# Patient Record
Sex: Female | Born: 1960
Health system: Southern US, Community
[De-identification: ages and names within clinical notes are randomized; demographics above are authoritative.]

## PROBLEM LIST (undated history)

## (undated) DIAGNOSIS — M1990 Unspecified osteoarthritis, unspecified site: Secondary | ICD-10-CM

## (undated) DIAGNOSIS — J45909 Unspecified asthma, uncomplicated: Secondary | ICD-10-CM

## (undated) DIAGNOSIS — R569 Unspecified convulsions: Secondary | ICD-10-CM

## (undated) DIAGNOSIS — K852 Alcohol induced acute pancreatitis without necrosis or infection: Secondary | ICD-10-CM

## (undated) DIAGNOSIS — F419 Anxiety disorder, unspecified: Secondary | ICD-10-CM

## (undated) DIAGNOSIS — E785 Hyperlipidemia, unspecified: Secondary | ICD-10-CM

## (undated) DIAGNOSIS — F988 Other specified behavioral and emotional disorders with onset usually occurring in childhood and adolescence: Secondary | ICD-10-CM

## (undated) DIAGNOSIS — M519 Unspecified thoracic, thoracolumbar and lumbosacral intervertebral disc disorder: Secondary | ICD-10-CM

## (undated) DIAGNOSIS — K859 Acute pancreatitis without necrosis or infection, unspecified: Secondary | ICD-10-CM

## (undated) DIAGNOSIS — F10939 Alcohol use, unspecified with withdrawal, unspecified: Secondary | ICD-10-CM

## (undated) DIAGNOSIS — I10 Essential (primary) hypertension: Secondary | ICD-10-CM

## (undated) DIAGNOSIS — J189 Pneumonia, unspecified organism: Secondary | ICD-10-CM

## (undated) DIAGNOSIS — R945 Abnormal results of liver function studies: Secondary | ICD-10-CM

## (undated) HISTORY — PX: CHOLECYSTECTOMY: SHX55

## (undated) HISTORY — PX: HERNIA REPAIR: SHX51

## (undated) HISTORY — PX: FOOT SURGERY: SHX648

## (undated) HISTORY — PX: KNEE ARTHROSCOPY: SUR90

---

## 1998-03-01 ENCOUNTER — Other Ambulatory Visit: Admission: RE | Admit: 1998-03-01 | Discharge: 1998-03-01 | Payer: Self-pay | Admitting: Obstetrics and Gynecology

## 1998-08-21 ENCOUNTER — Ambulatory Visit (HOSPITAL_COMMUNITY): Admission: RE | Admit: 1998-08-21 | Discharge: 1998-08-21 | Payer: Self-pay | Admitting: Obstetrics and Gynecology

## 1999-01-21 ENCOUNTER — Other Ambulatory Visit: Admission: RE | Admit: 1999-01-21 | Discharge: 1999-01-21 | Payer: Self-pay | Admitting: *Deleted

## 1999-03-27 ENCOUNTER — Other Ambulatory Visit: Admission: RE | Admit: 1999-03-27 | Discharge: 1999-03-27 | Payer: Self-pay | Admitting: Obstetrics and Gynecology

## 1999-09-17 ENCOUNTER — Encounter: Payer: Self-pay | Admitting: Family Medicine

## 1999-09-17 ENCOUNTER — Ambulatory Visit (HOSPITAL_COMMUNITY): Admission: RE | Admit: 1999-09-17 | Discharge: 1999-09-17 | Payer: Self-pay | Admitting: Family Medicine

## 2000-04-06 ENCOUNTER — Other Ambulatory Visit: Admission: RE | Admit: 2000-04-06 | Discharge: 2000-04-06 | Payer: Self-pay | Admitting: Obstetrics and Gynecology

## 2000-10-06 ENCOUNTER — Encounter: Payer: Self-pay | Admitting: Obstetrics and Gynecology

## 2000-10-06 ENCOUNTER — Ambulatory Visit (HOSPITAL_COMMUNITY): Admission: RE | Admit: 2000-10-06 | Discharge: 2000-10-06 | Payer: Self-pay | Admitting: Family Medicine

## 2000-10-27 ENCOUNTER — Encounter: Payer: Self-pay | Admitting: Vascular Surgery

## 2000-10-29 ENCOUNTER — Encounter (INDEPENDENT_AMBULATORY_CARE_PROVIDER_SITE_OTHER): Payer: Self-pay

## 2000-10-29 ENCOUNTER — Ambulatory Visit (HOSPITAL_COMMUNITY): Admission: RE | Admit: 2000-10-29 | Discharge: 2000-10-30 | Payer: Self-pay | Admitting: Vascular Surgery

## 2001-05-17 ENCOUNTER — Other Ambulatory Visit: Admission: RE | Admit: 2001-05-17 | Discharge: 2001-05-17 | Payer: Self-pay | Admitting: *Deleted

## 2001-06-03 ENCOUNTER — Ambulatory Visit (HOSPITAL_COMMUNITY): Admission: RE | Admit: 2001-06-03 | Discharge: 2001-06-03 | Payer: Self-pay | Admitting: Vascular Surgery

## 2001-06-03 ENCOUNTER — Encounter (INDEPENDENT_AMBULATORY_CARE_PROVIDER_SITE_OTHER): Payer: Self-pay | Admitting: *Deleted

## 2001-10-19 ENCOUNTER — Ambulatory Visit (HOSPITAL_COMMUNITY): Admission: RE | Admit: 2001-10-19 | Discharge: 2001-10-19 | Payer: Self-pay | Admitting: Obstetrics and Gynecology

## 2001-10-19 ENCOUNTER — Encounter: Payer: Self-pay | Admitting: Obstetrics and Gynecology

## 2002-04-13 ENCOUNTER — Other Ambulatory Visit: Admission: RE | Admit: 2002-04-13 | Discharge: 2002-04-13 | Payer: Self-pay | Admitting: Obstetrics and Gynecology

## 2003-03-20 ENCOUNTER — Encounter: Payer: Self-pay | Admitting: Obstetrics and Gynecology

## 2003-03-20 ENCOUNTER — Ambulatory Visit (HOSPITAL_COMMUNITY): Admission: RE | Admit: 2003-03-20 | Discharge: 2003-03-20 | Payer: Self-pay | Admitting: Obstetrics and Gynecology

## 2003-06-12 ENCOUNTER — Other Ambulatory Visit: Admission: RE | Admit: 2003-06-12 | Discharge: 2003-06-12 | Payer: Self-pay | Admitting: Obstetrics and Gynecology

## 2004-06-18 ENCOUNTER — Other Ambulatory Visit: Admission: RE | Admit: 2004-06-18 | Discharge: 2004-06-18 | Payer: Self-pay | Admitting: Obstetrics and Gynecology

## 2004-06-27 ENCOUNTER — Ambulatory Visit (HOSPITAL_COMMUNITY): Admission: RE | Admit: 2004-06-27 | Discharge: 2004-06-27 | Payer: Self-pay | Admitting: Orthopedic Surgery

## 2004-06-27 ENCOUNTER — Ambulatory Visit (HOSPITAL_BASED_OUTPATIENT_CLINIC_OR_DEPARTMENT_OTHER): Admission: RE | Admit: 2004-06-27 | Discharge: 2004-06-27 | Payer: Self-pay | Admitting: Orthopedic Surgery

## 2004-10-24 ENCOUNTER — Ambulatory Visit (HOSPITAL_COMMUNITY): Admission: RE | Admit: 2004-10-24 | Discharge: 2004-10-24 | Payer: Self-pay | Admitting: Obstetrics and Gynecology

## 2005-01-29 ENCOUNTER — Encounter (INDEPENDENT_AMBULATORY_CARE_PROVIDER_SITE_OTHER): Payer: Self-pay | Admitting: Specialist

## 2005-01-29 ENCOUNTER — Ambulatory Visit (HOSPITAL_COMMUNITY): Admission: RE | Admit: 2005-01-29 | Discharge: 2005-01-29 | Payer: Self-pay | Admitting: *Deleted

## 2005-10-02 ENCOUNTER — Other Ambulatory Visit: Admission: RE | Admit: 2005-10-02 | Discharge: 2005-10-02 | Payer: Self-pay | Admitting: Obstetrics and Gynecology

## 2005-12-23 ENCOUNTER — Ambulatory Visit (HOSPITAL_COMMUNITY): Admission: RE | Admit: 2005-12-23 | Discharge: 2005-12-23 | Payer: Self-pay | Admitting: Obstetrics and Gynecology

## 2007-01-12 ENCOUNTER — Ambulatory Visit (HOSPITAL_COMMUNITY): Admission: RE | Admit: 2007-01-12 | Discharge: 2007-01-12 | Payer: Self-pay | Admitting: Obstetrics and Gynecology

## 2007-05-14 ENCOUNTER — Encounter: Admission: RE | Admit: 2007-05-14 | Discharge: 2007-05-14 | Payer: Self-pay | Admitting: Family Medicine

## 2007-05-14 IMAGING — US US ABDOMEN COMPLETE
1 series · 13 of 25 positions shown · non-contrast
Comparison: none

CLINICAL DATA: Left lower quadrant pain.  Laparoscopy for endometriosis.  Diabetes, hypertension. 
COMPLETE ABDOMINAL ULTRASOUND:
TECHNIQUE: Complete abdominal ultrasound examination was performed including evaluation of the liver, gallbladder, bile ducts, pancreas, kidneys, spleen, IVC, and abdominal aorta.

[Series 1: us abdomen complete · 0.33mm/px · 13 of 116 slices shown]
[im 1/116]
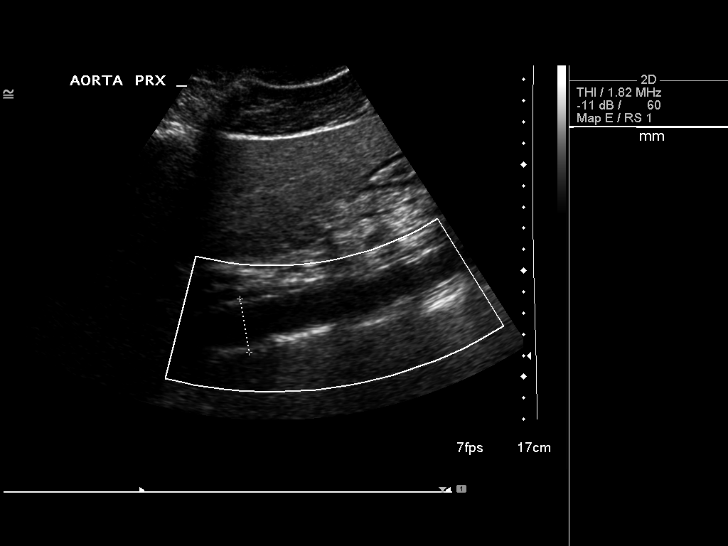
[im 10/116]
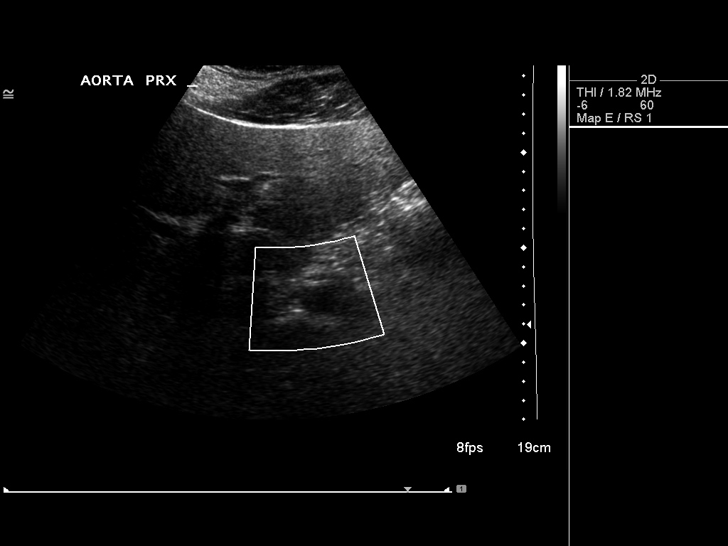
[im 20/116]
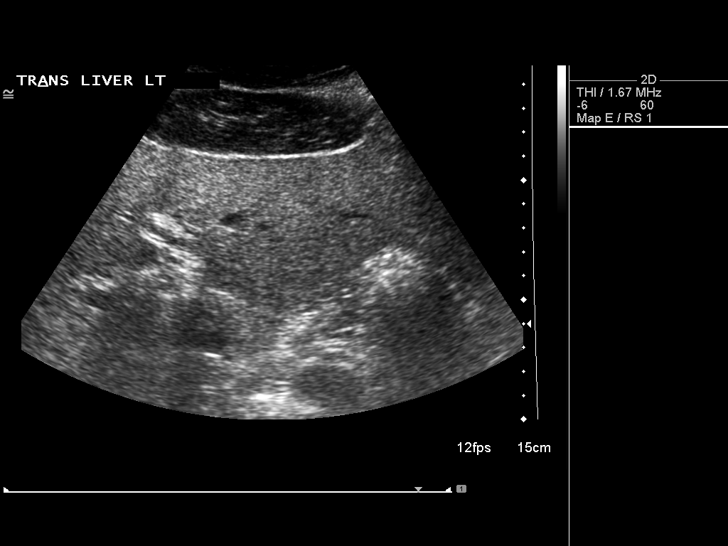
[im 29/116]
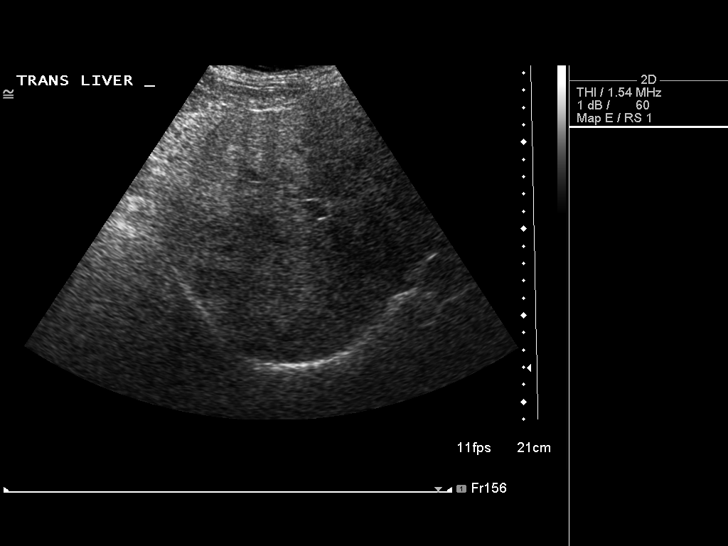
[im 39/116]
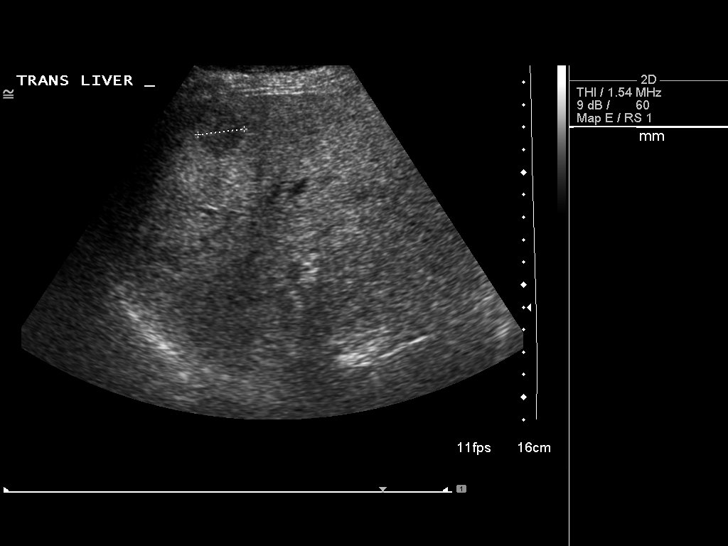
[im 48/116]
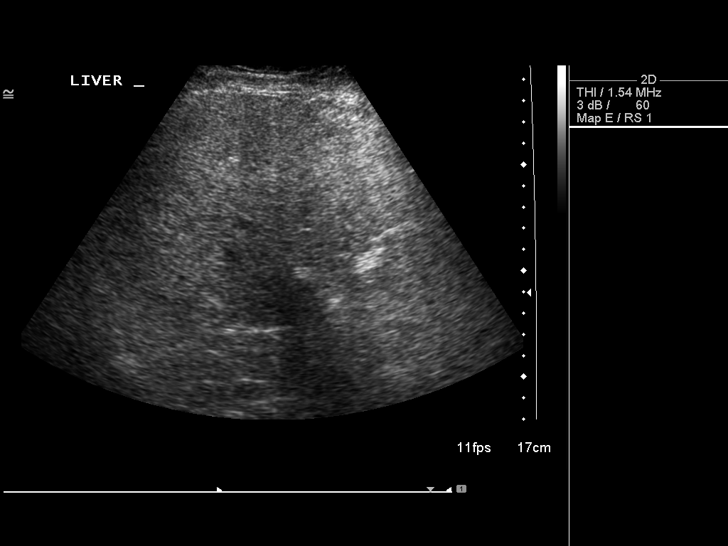
[im 58/116]
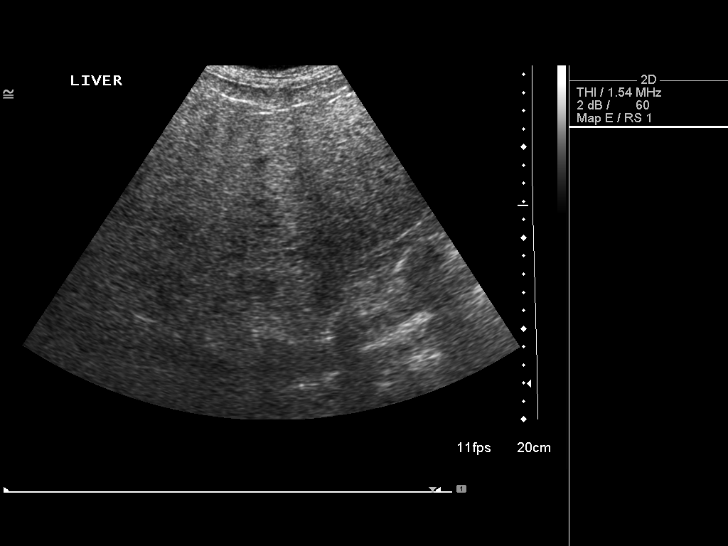
[im 68/116]
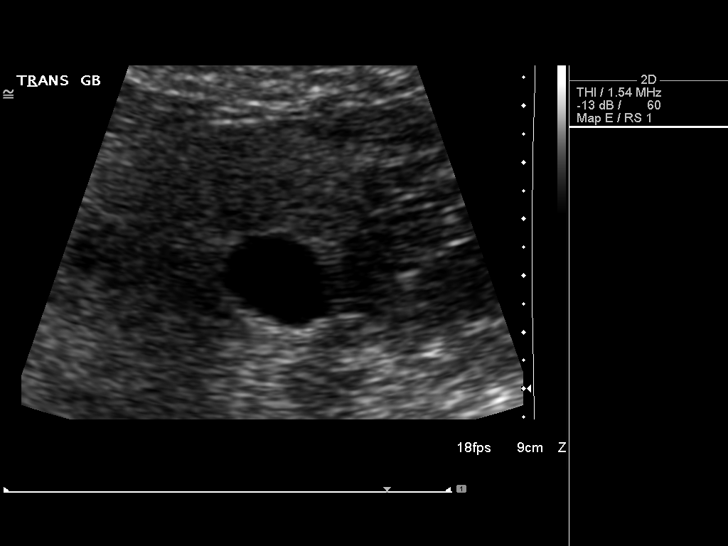
[im 77/116]
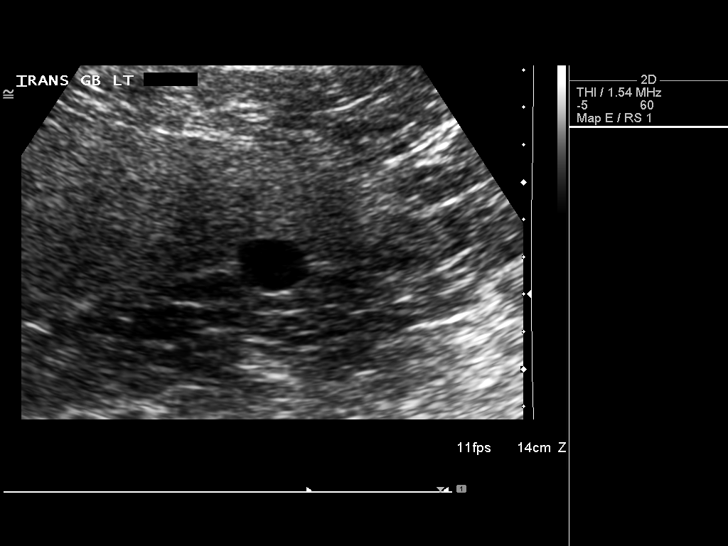
[im 87/116]
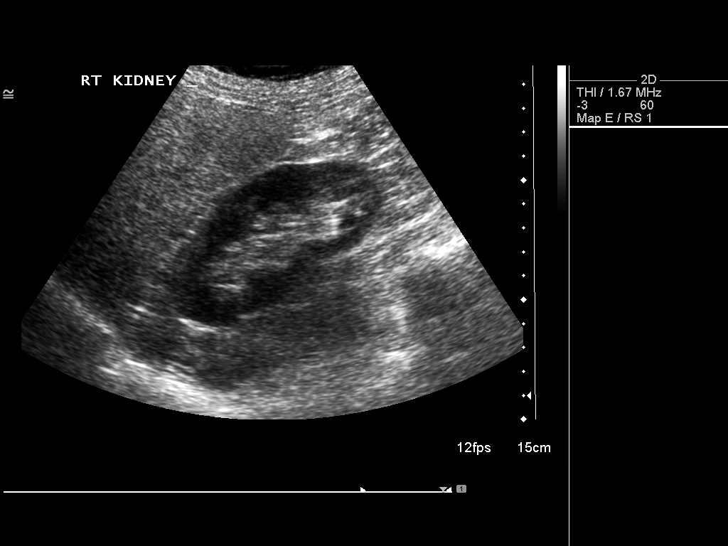
[im 96/116]
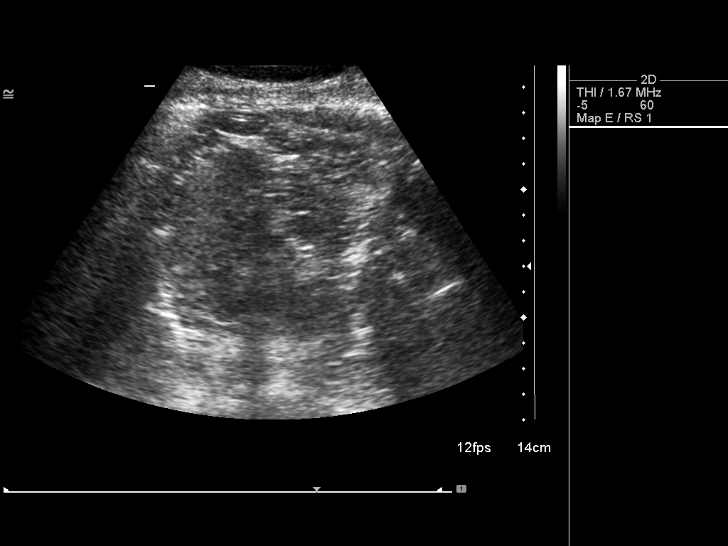
[im 106/116]
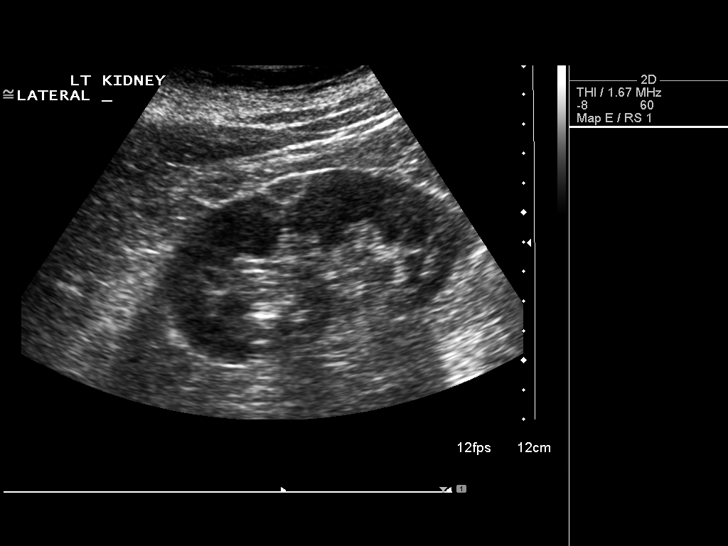
[im 116/116]
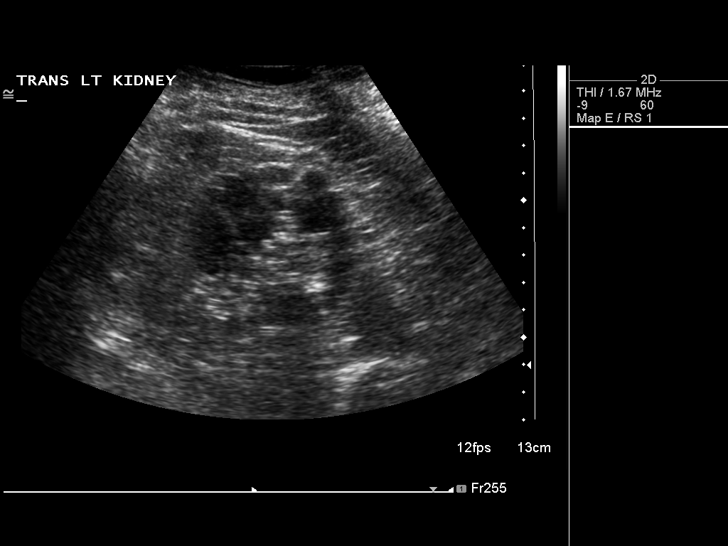

[13 of 25 positions shown; findings below may reference images not displayed]

FINDINGS: Normal gallbladder.  Gallbladder wall thickness is 1.4mm.  The common duct measures 3.6mm which is within normal limits.  The liver has diffuse increase in echogenicity compatible with fatty infiltration.  With in the left hepatic lobe there is a 1.8 x 2.0 x 2.1cm somewhat oval focus which is hypoechoic to isoechoic.  Some internal color flow was noted.  The borders are somewhat irregular.  Cannot exclude a liver mass.  Pancreas is unremarkable.  Spleen measures 7.8cm in length.  The right and left kidneys measure 9.7cm and 10.2cm in length, respectively.  There is echogenic rim nodular like focus along the lateral aspect of the mid left kidney measuring 0.8 x 1.3 x 1.4cm.  I cannot exclude a small lesion at that site.  
Abdominal aorta maximum diameter is 2.6cm.  Patent IVC.
IMPRESSION: Diffuse fatty infiltration of the liver.  Liver lesion is noted and is suspicious for a mass including primary or secondary tumors.  Suspicion for small mass or lesion involving the mid left kidney.  For further work up I would recommend contrast enhanced CT or MRI with special attention to the liver and left kidney.

## 2007-05-14 IMAGING — US US TRANSVAGINAL NON-OB
1 series · 14 of 25 positions shown · non-contrast
Comparison: none

CLINICAL DATA: Left lower quadrant pain. 
 TRANSABDOMINAL AND TRANSVAGINAL PELVIC ULTRASOUND:
TECHNIQUE: Both transabdominal and transvaginal ultrasound examinations of the pelvis were performed including evaluation of the uterus, ovaries, adnexal regions, and pelvic cul-de-sac.

[Series 1: us transvaginal non-ob · 0.13mm/px · 14 of 45 slices shown]
[im 1/45]
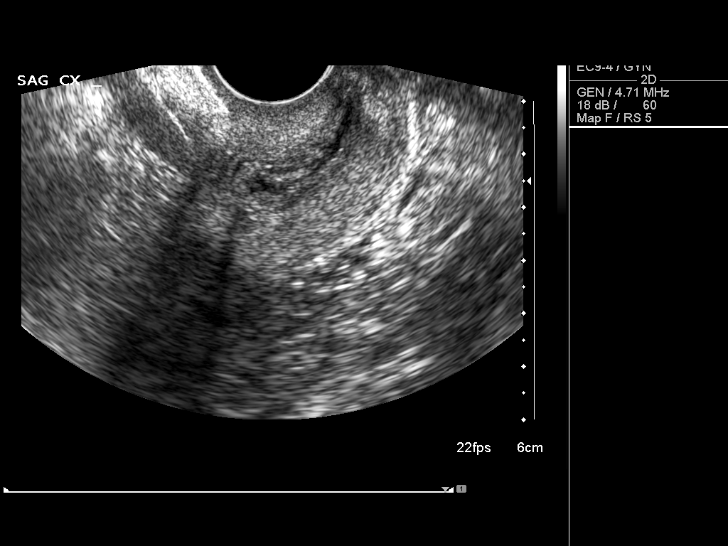
[im 4/45]
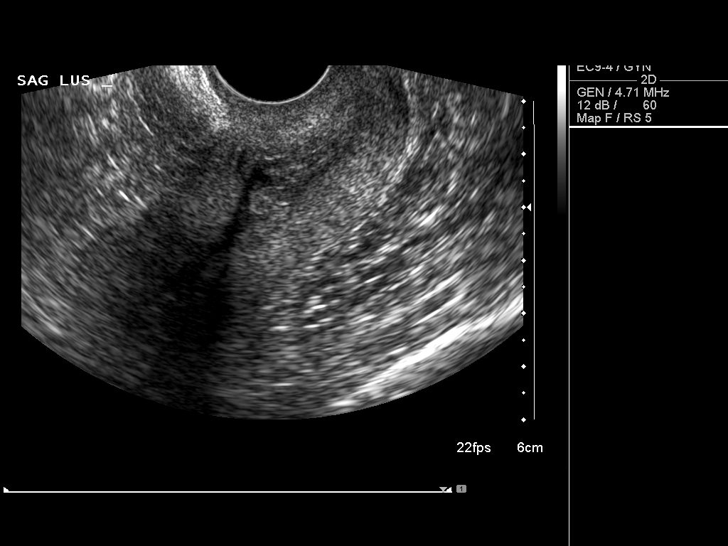
[im 8/45]
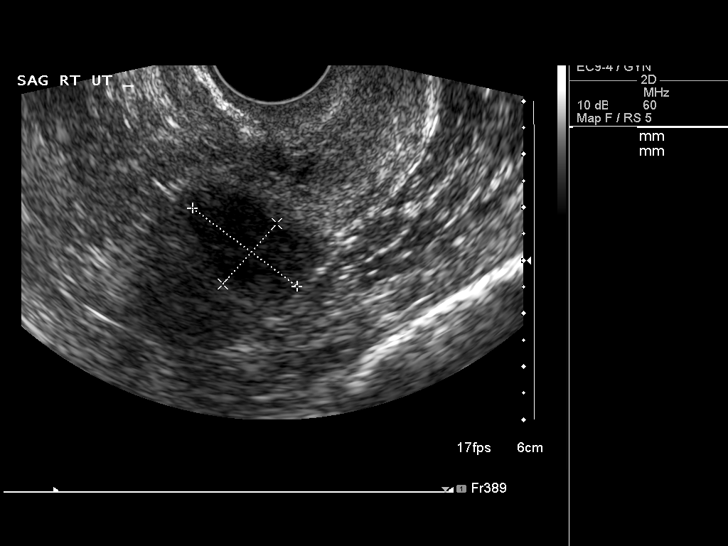
[im 12/45]
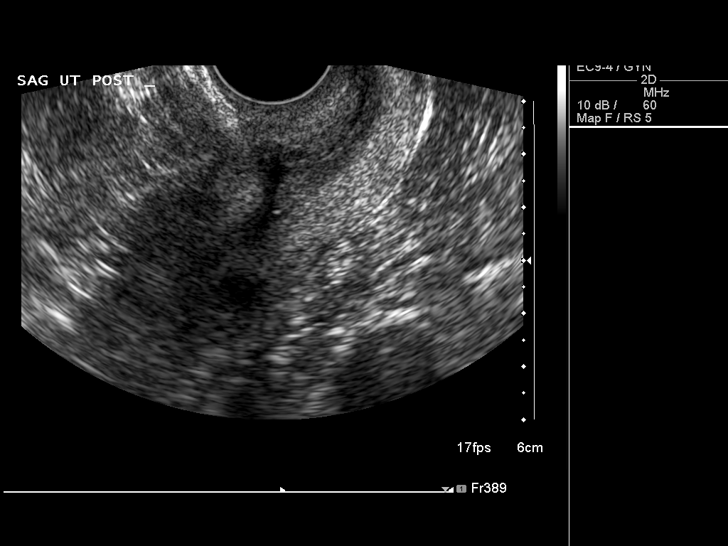
[im 15/45]
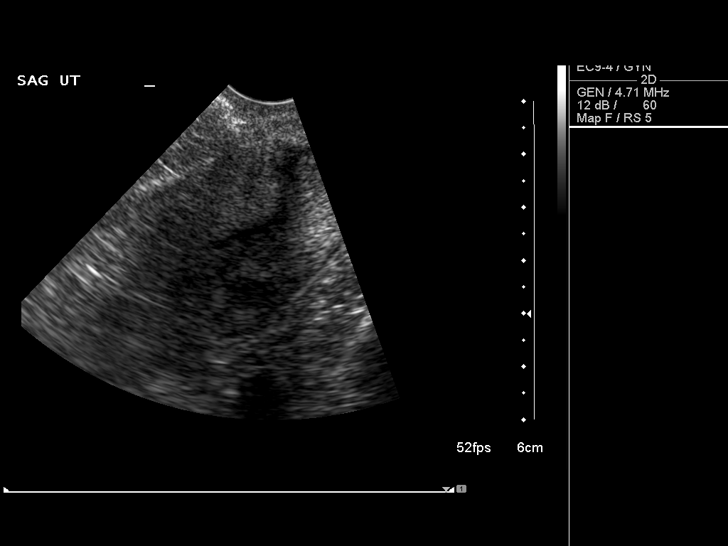
[im 17/45]
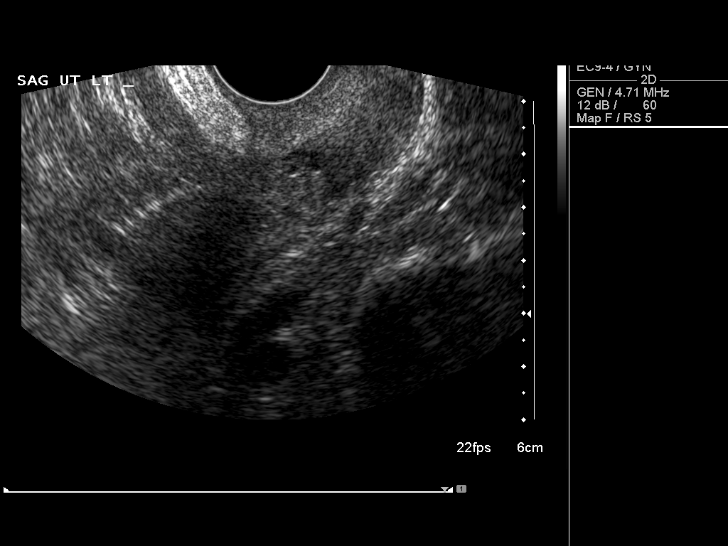
[im 21/45]
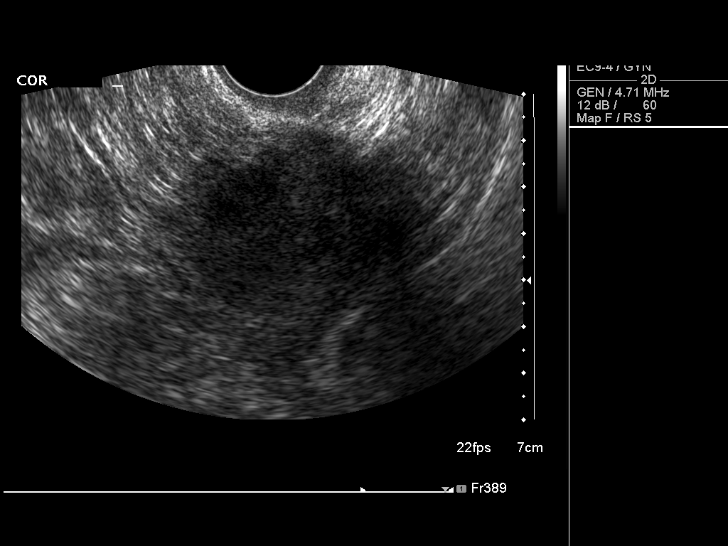
[im 24/45]
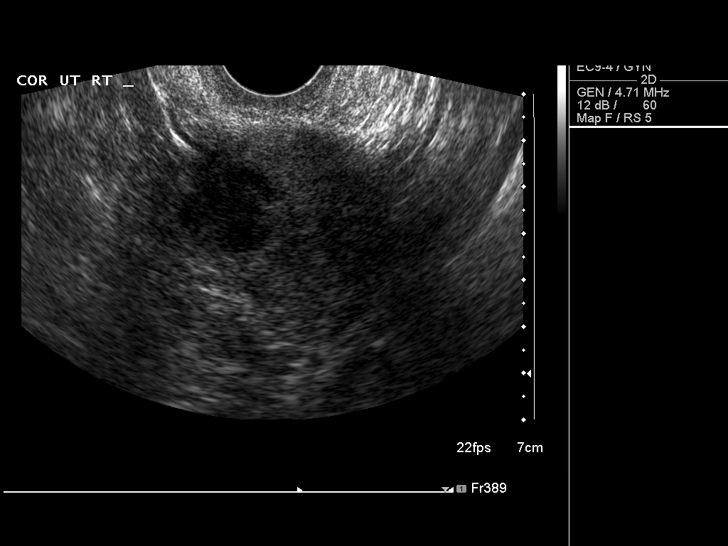
[im 28/45]
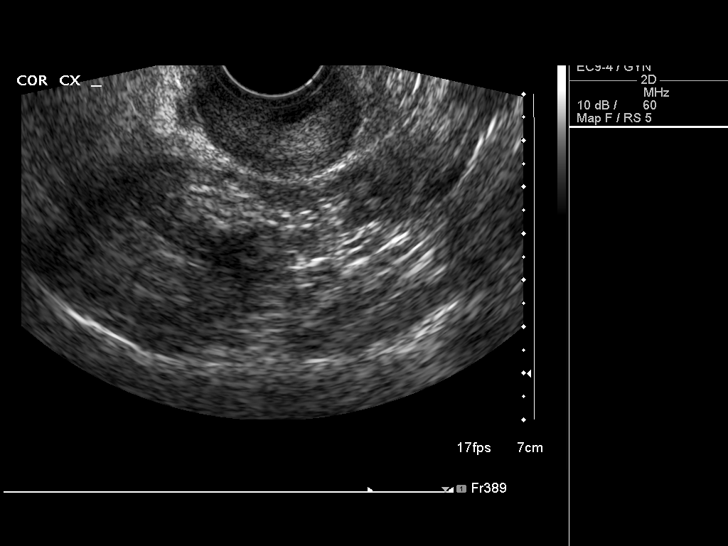
[im 30/45]
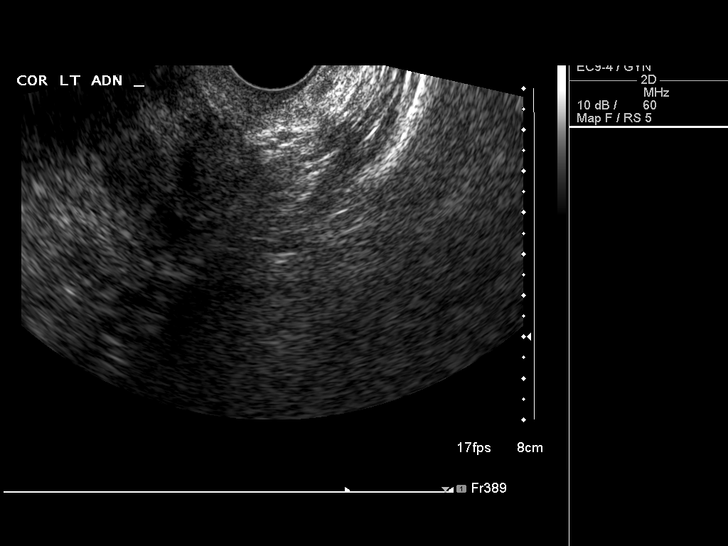
[im 34/45]
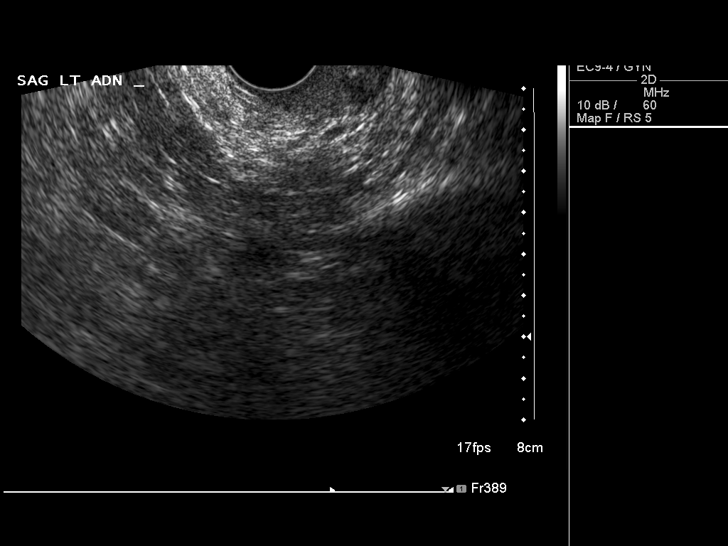
[im 37/45]
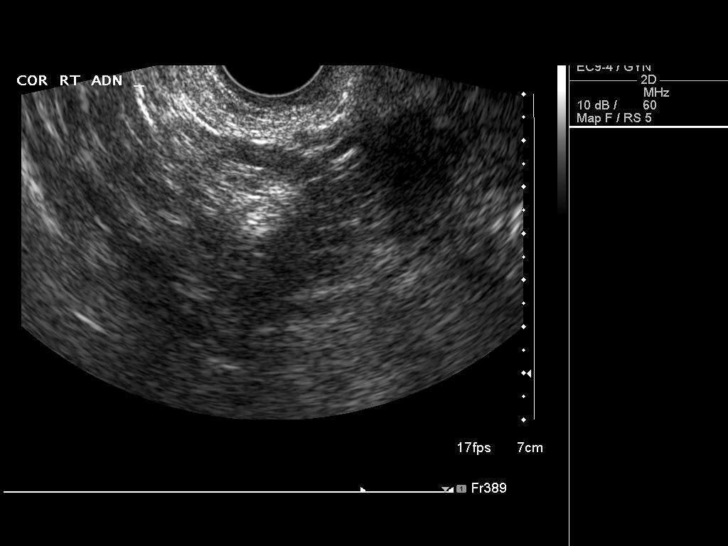
[im 41/45]
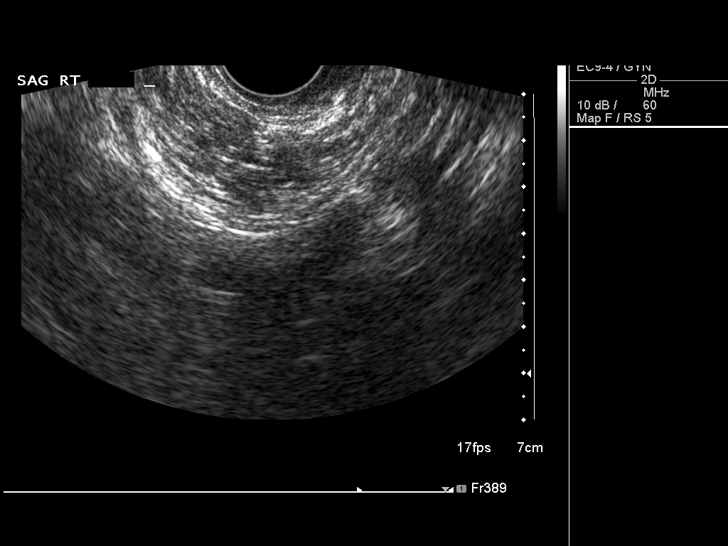
[im 45/45]
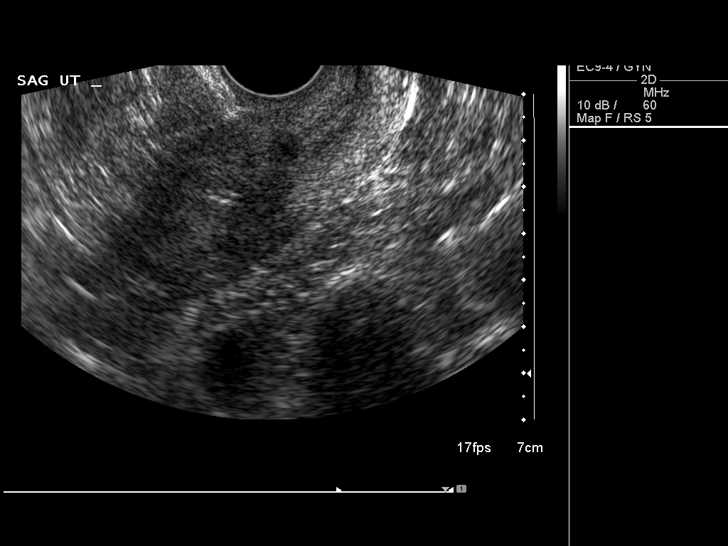

[14 of 25 positions shown; findings below may reference images not displayed]

FINDINGS: The uterus measures 7.3 cm in length and the fundus measures 2.6 x 4.4 cm in AP and width dimensions, respectively.  Two hypoechoic foci noted within the uterus the largest measuring 1.5 x 2.2 x 2.5 cm and the smaller 0.9 x 1.0 x 1.1 cm.  They are compatible with fibroids.  Ovaries cannot be visualized.  No free pelvic fluid.  Endometrial stripe thickness is 5.2 mm.
IMPRESSION: Findings compatible with uterine fibroids.

## 2007-05-14 IMAGING — US US TRANSVAGINAL NON-OB
1 series · 14 of 25 positions shown · non-contrast
Comparison: none

CLINICAL DATA: Left lower quadrant pain. 
 TRANSABDOMINAL AND TRANSVAGINAL PELVIC ULTRASOUND:
TECHNIQUE: Both transabdominal and transvaginal ultrasound examinations of the pelvis were performed including evaluation of the uterus, ovaries, adnexal regions, and pelvic cul-de-sac.

[Series 1: us transvaginal non-ob · 0.22mm/px · 14 of 33 slices shown]
[im 1/33]
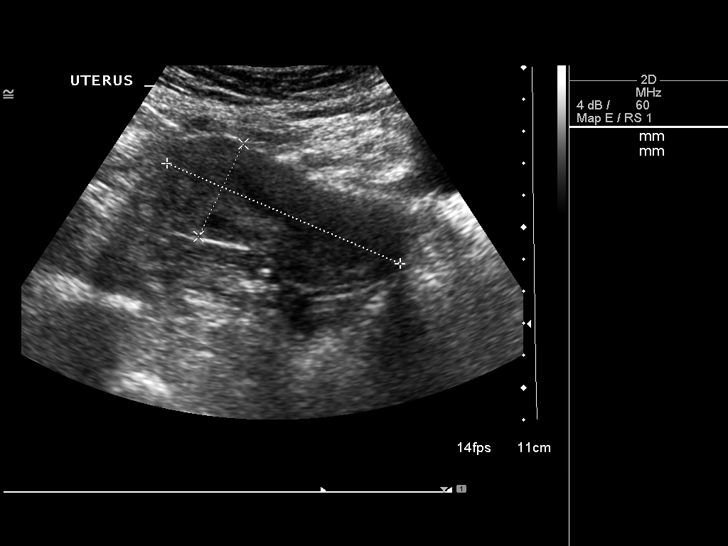
[im 3/33]
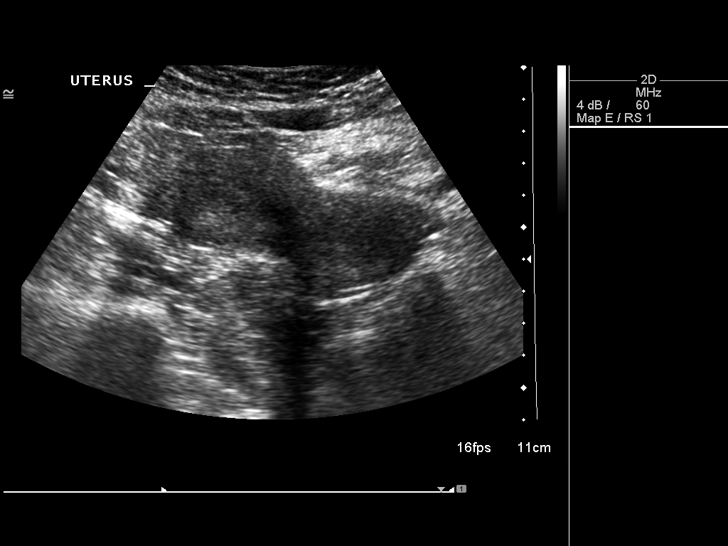
[im 6/33]
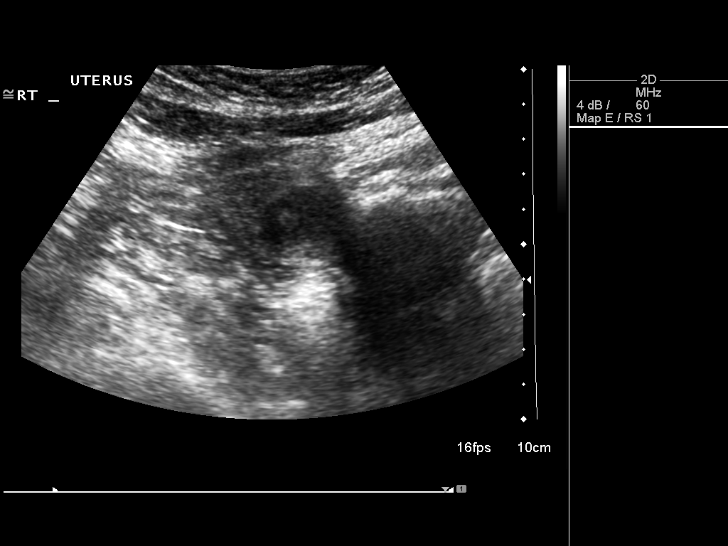
[im 9/33]
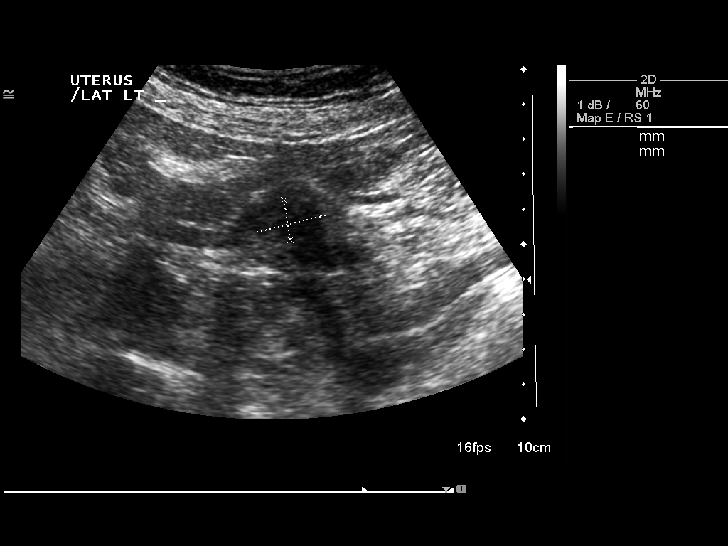
[im 11/33]
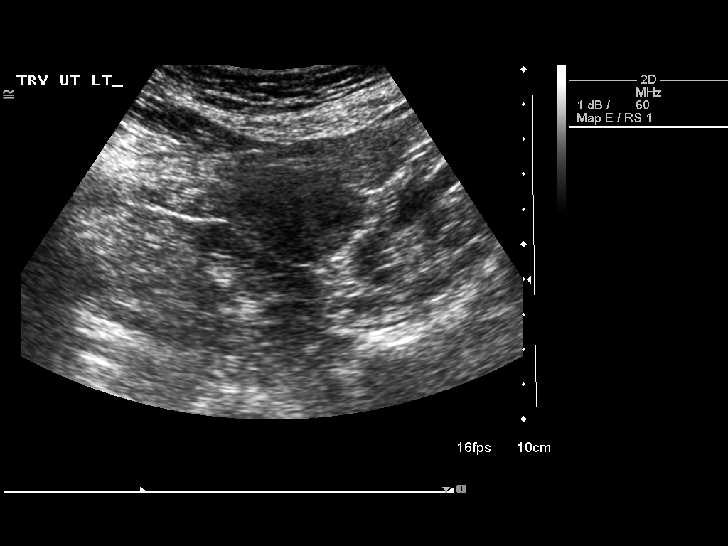
[im 13/33]
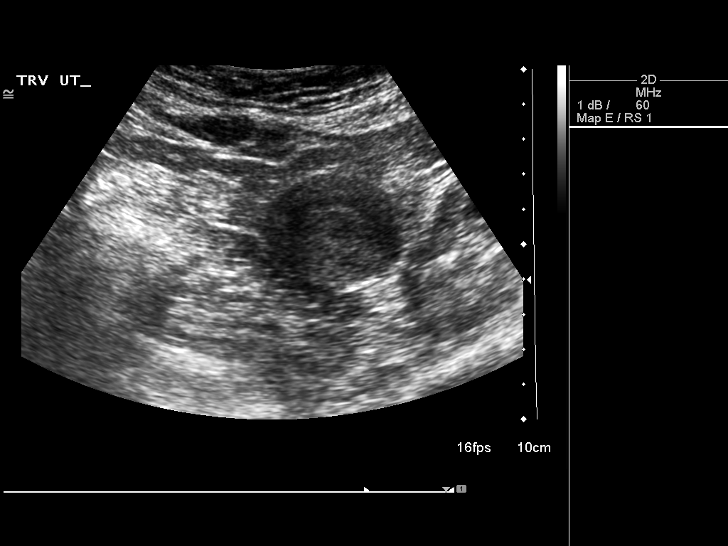
[im 15/33]
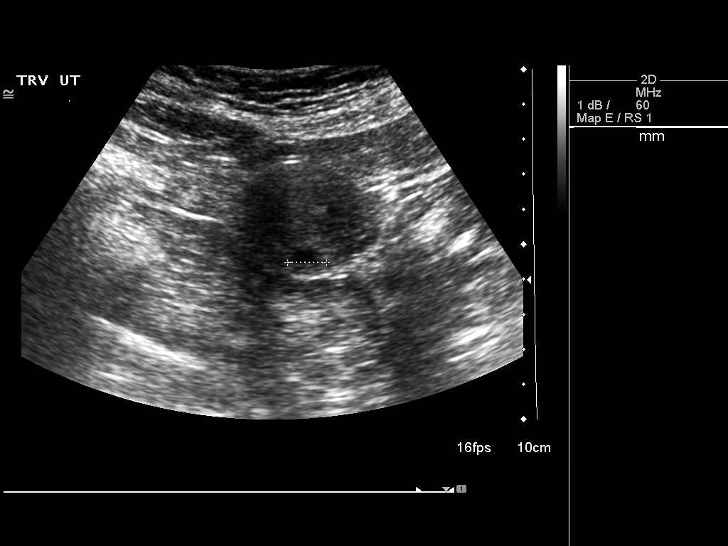
[im 18/33]
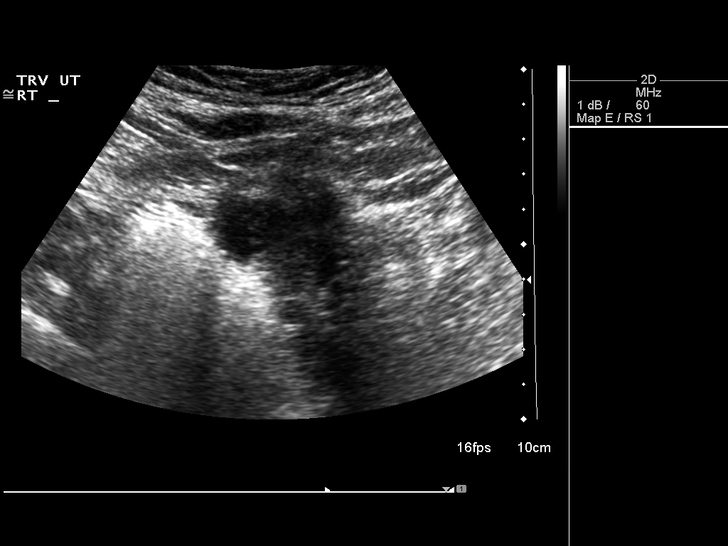
[im 21/33]
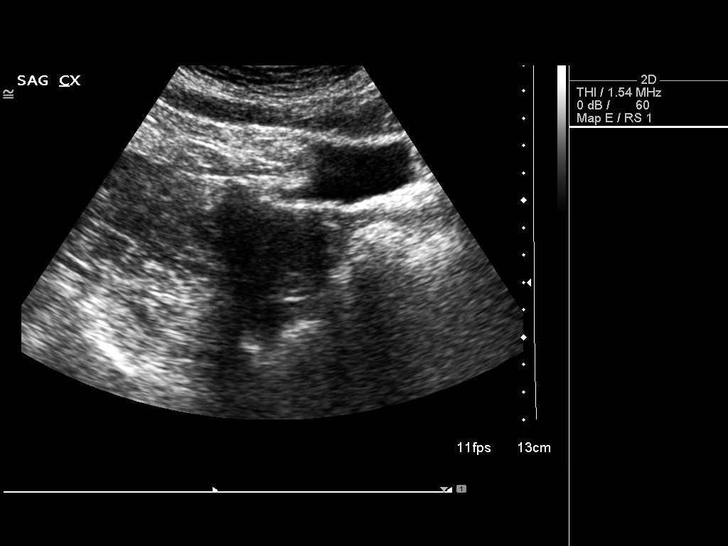
[im 22/33]
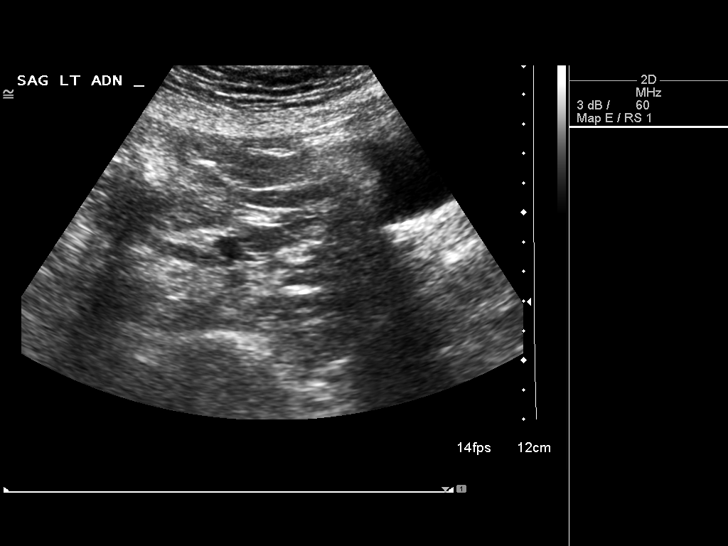
[im 25/33]
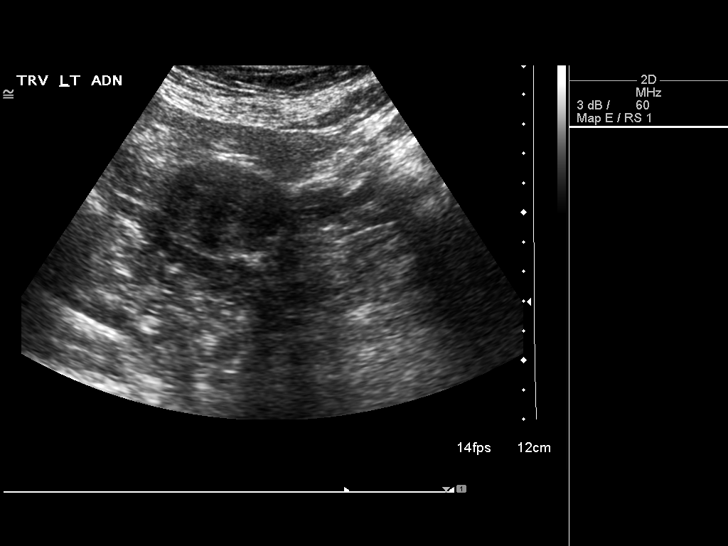
[im 27/33]
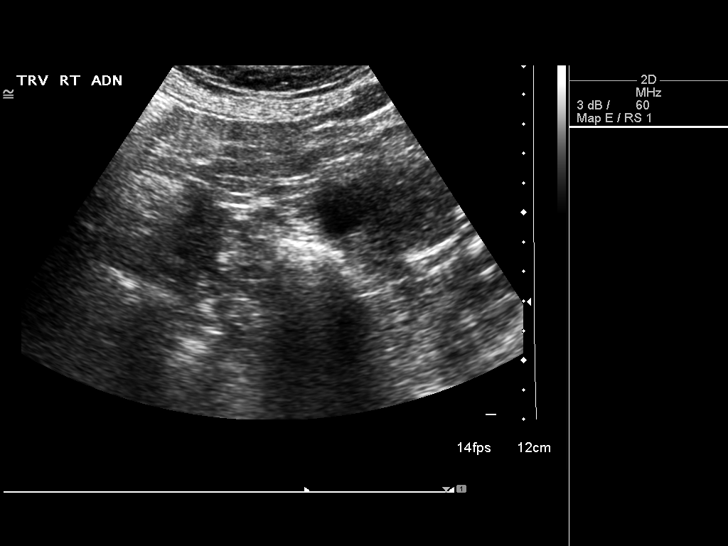
[im 30/33]
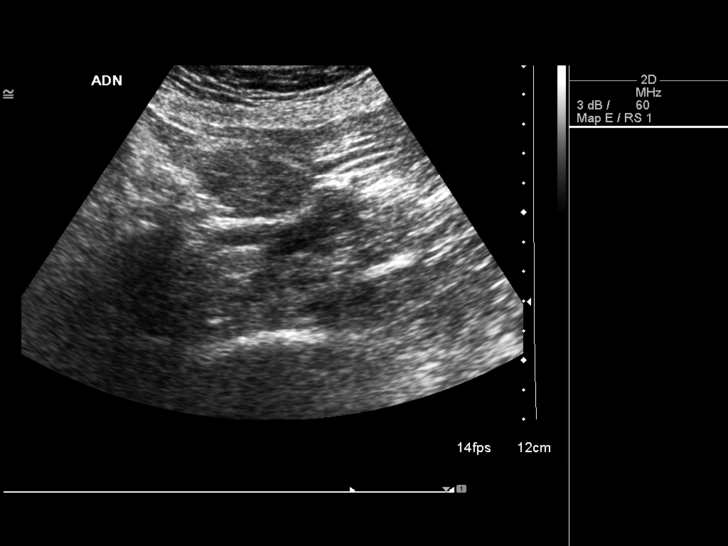
[im 33/33]
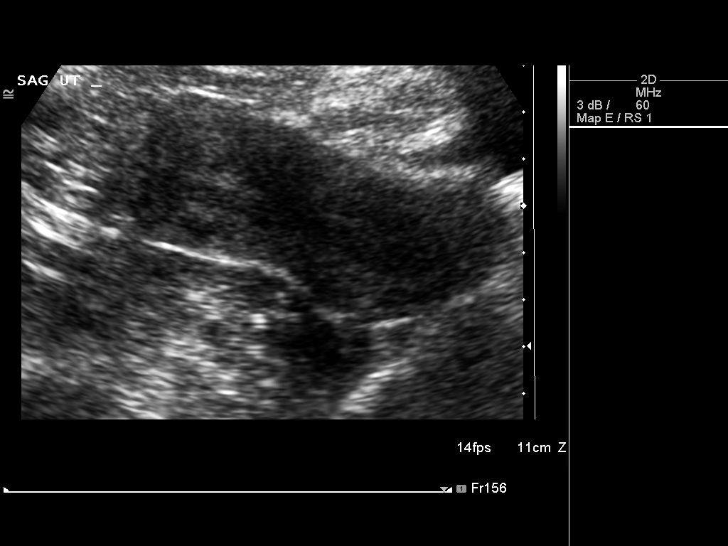

[14 of 25 positions shown; findings below may reference images not displayed]

FINDINGS: The uterus measures 7.3 cm in length and the fundus measures 2.6 x 4.4 cm in AP and width dimensions, respectively.  Two hypoechoic foci noted within the uterus the largest measuring 1.5 x 2.2 x 2.5 cm and the smaller 0.9 x 1.0 x 1.1 cm.  They are compatible with fibroids.  Ovaries cannot be visualized.  No free pelvic fluid.  Endometrial stripe thickness is 5.2 mm.
IMPRESSION: Findings compatible with uterine fibroids.

## 2007-05-21 ENCOUNTER — Encounter: Admission: RE | Admit: 2007-05-21 | Discharge: 2007-05-21 | Payer: Self-pay | Admitting: Family Medicine

## 2007-05-21 IMAGING — CT CT ABDOMEN WO/W CM
2 of 8 series · 12 of 46 positions shown, 14 images · IV contrast (READICAT/WATER & [ID] OMNI 300)
Comparison: Ultrasound of [DATE].

CLINICAL DATA: Abnormal renal and hepatic lesions noted on ultrasound.
 ABDOMEN CT WITHOUT AND WITH CONTRAST:
TECHNIQUE: Multidetector CT imaging of the abdomen was performed both before and during bolus administration of intravenous contrast.
 Contrast:  100 cc of Omnipaque 300.
TECHNIQUE: Multidetector CT imaging of the pelvis was performed following the standard protocol during bolus administration of intravenous contrast.
 The appendix appears grossly normal in the right lower quadrant.  Small nodes are present in the right lower quadrant.  The uterus is normal in size.  There is a   rounded area of enhancement in the right aspect of the uterus, most likely representing uterine fibroid.  Clinical correlation is recommended.  No adnexal lesion is seen.  No fluid is noted within the pelvis.  The urinary bladder is decompressed.

[Series 3: abdomen w/ · axial · 0.84mm/px · z∈[-427,-27]mm · 9 of 102 slices shown, 11 images]
[im 11/102  soft-tissue]
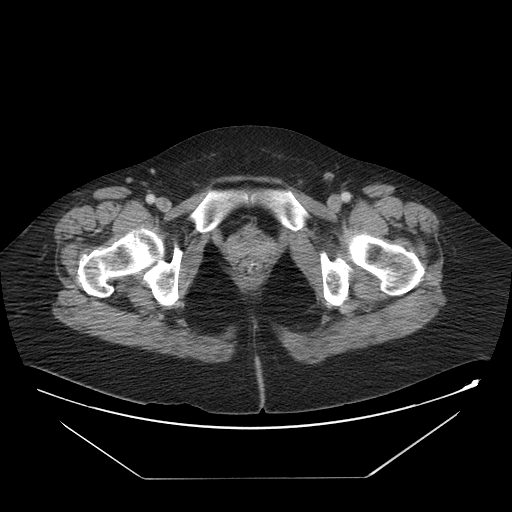
[im 11/102  bone]
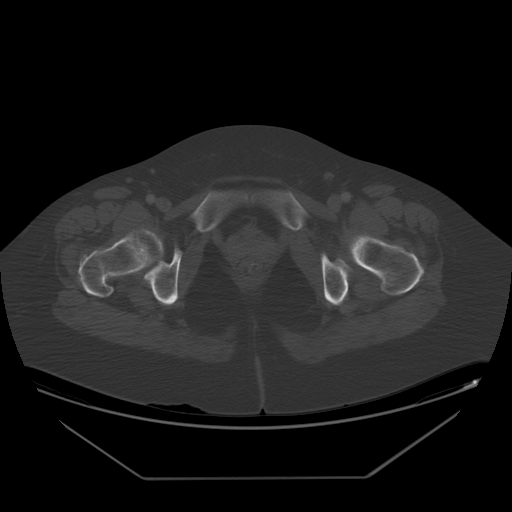
[im 21/102  soft-tissue]
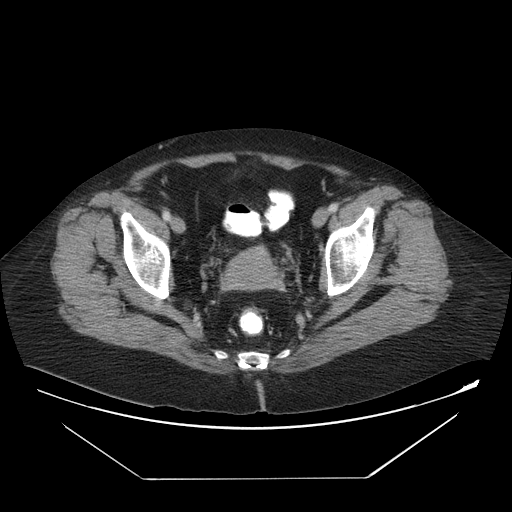
[im 31/102  soft-tissue]
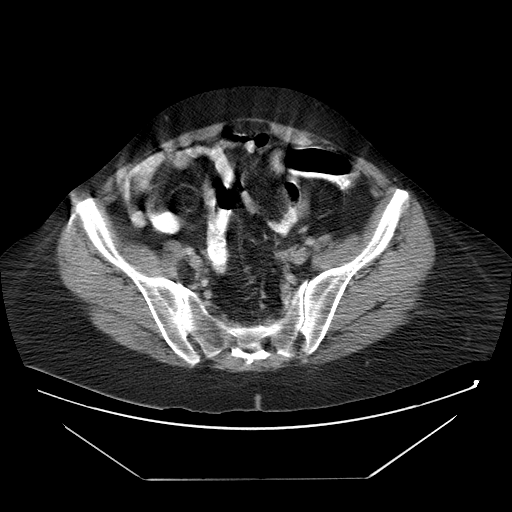
[im 41/102  soft-tissue]
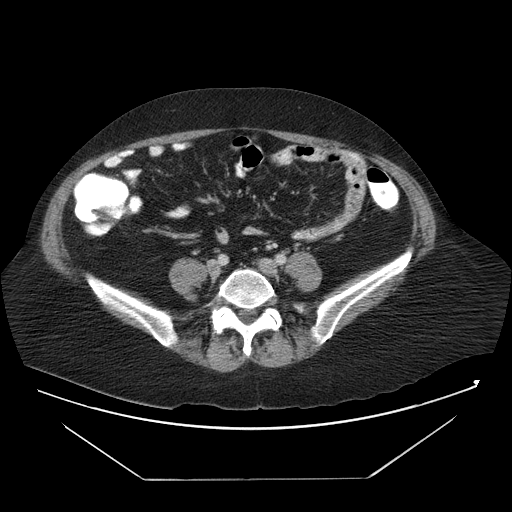
[im 51/102  soft-tissue]
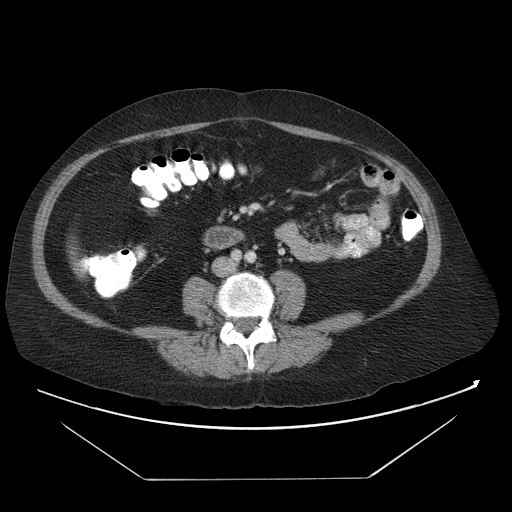
[im 61/102  soft-tissue]
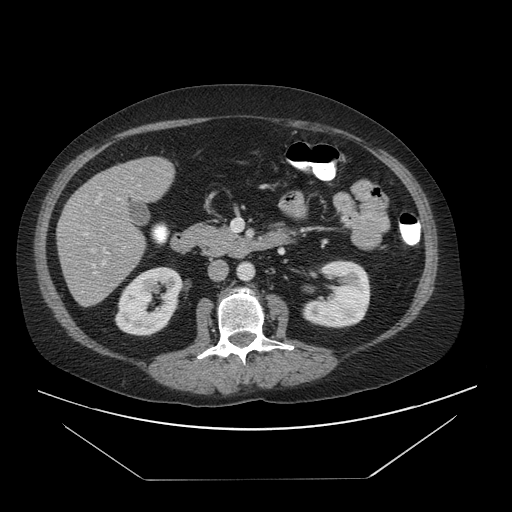
[im 71/102  soft-tissue]
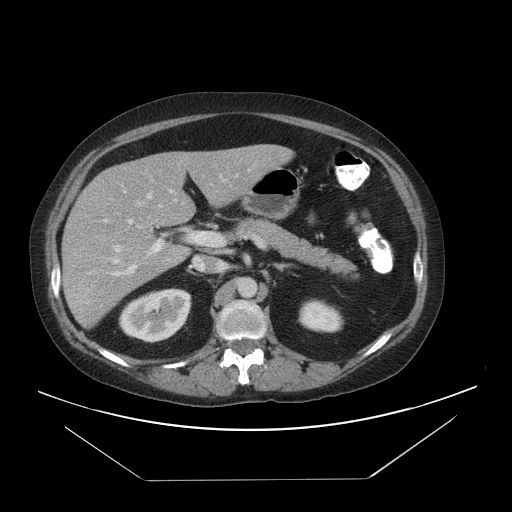
[im 81/102  soft-tissue]
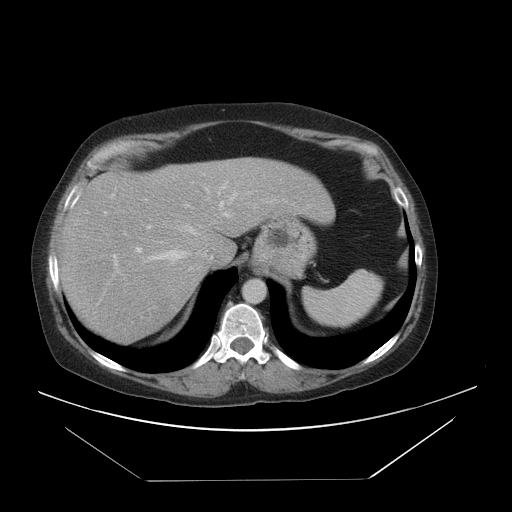
[im 91/102  soft-tissue]
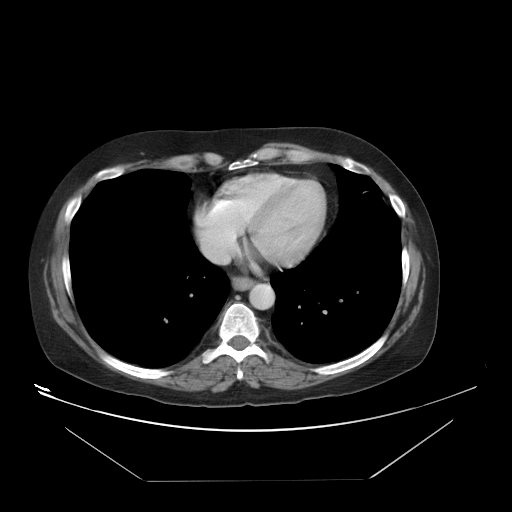
[im 91/102  bone]
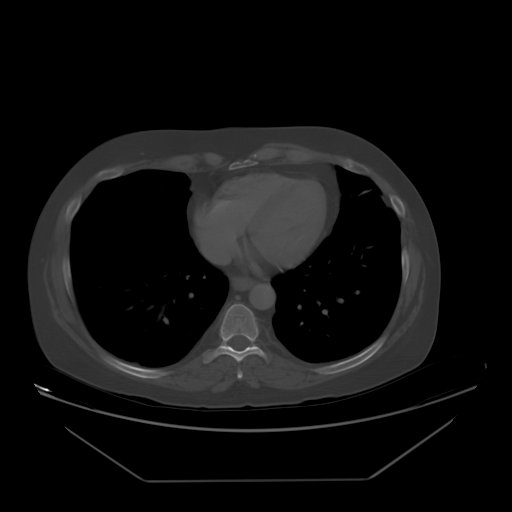

[Series 501: cor · coronal · 1.01mm/px · 3 of 147 slices shown]
[im 49/147  soft-tissue]
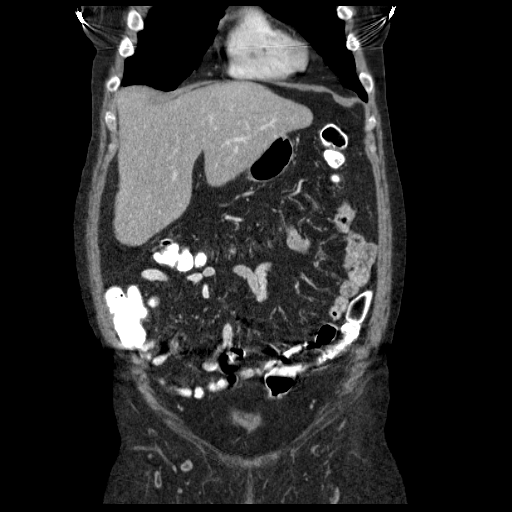
[im 65/147  soft-tissue]
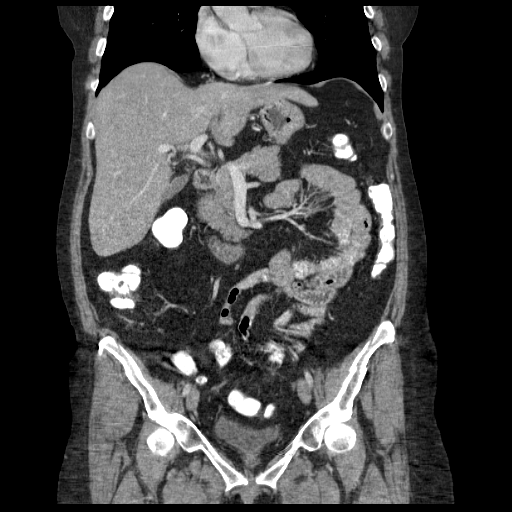
[im 82/147  soft-tissue]
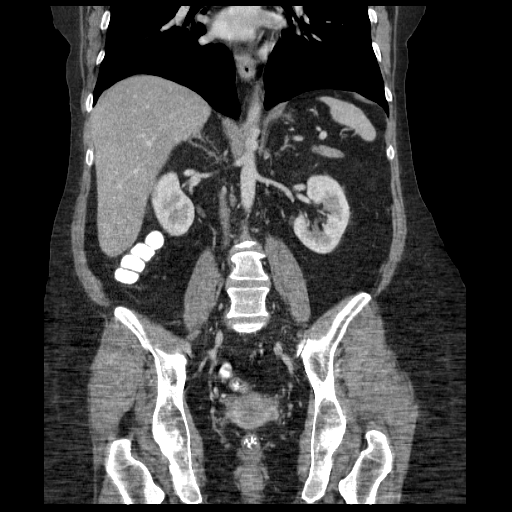

[12 of 46 positions shown; findings below may reference images not displayed]

A 4 mm noncalcified nodule is noted in the periphery of the right lower lobe on image #4.  Follow-up CT in 6-12 months is recommended if the patient is high risk.
 On the unenhanced series, no hepatic abnormality is evident.  However, the area questioned in the periphery of the left kidney by ultrasound appears to represent an area of parenchymal loss and fatty deposition, of doubtful clinical significance.  A small hiatal hernia or mild reflux esophagitis is noted with some thickening of the distal esophageal mucosa.  On the enhanced study, there is only a single area of enhancement in the periphery of the right posterior lobe of liver without mass effect.  This becomes less discrete on delayed images and probably represents an area of focal sparing with fatty infiltration of the liver.  I doubt that this represents a significant lesion.  Follow-up ultrasound may be helpful in six months to assess stability. No ductal dilatation is noted. The pancreas is normal in size and the pancreatic duct is not dilated.  The adrenal glands and spleen appear normal.  The kidneys enhance and, on delayed images, the pelvocaliceal systems appear normal.  The abdominal aorta is normal in caliber.
IMPRESSION: 1.  The area questioned by ultrasound in the right lobe of liver appears most typical of fatty infiltration with an area of focal sparing.  No discrete hepatic lesion is seen and no ductal dilatation is noted.
 2.  The area questioned in the periphery of the left kidney appears to represent an area of parenchymal loss and is fatty in density by CT, most consistent with benign process.
 3.  4 mm noncalcified nodule in the right lower lobe.  Follow-up CT in 6-12 months, if high risk patient.
 4.  Question small hiatal hernia. 
 PELVIS CT WITH CONTRAST:
IMPRESSION: 1.  No significant abnormality on CT of the pelvis.  There is a question of a right-sided uterine fibroid.

## 2008-01-26 ENCOUNTER — Ambulatory Visit (HOSPITAL_COMMUNITY): Admission: RE | Admit: 2008-01-26 | Discharge: 2008-01-26 | Payer: Self-pay | Admitting: Obstetrics and Gynecology

## 2009-02-27 ENCOUNTER — Ambulatory Visit (HOSPITAL_COMMUNITY): Admission: RE | Admit: 2009-02-27 | Discharge: 2009-02-27 | Payer: Self-pay | Admitting: Obstetrics and Gynecology

## 2009-07-17 ENCOUNTER — Ambulatory Visit: Payer: Self-pay | Admitting: Pain Medicine

## 2009-08-03 ENCOUNTER — Ambulatory Visit: Payer: Self-pay | Admitting: Pain Medicine

## 2009-08-21 ENCOUNTER — Ambulatory Visit: Payer: Self-pay | Admitting: Pain Medicine

## 2010-04-26 ENCOUNTER — Ambulatory Visit (HOSPITAL_COMMUNITY): Admission: RE | Admit: 2010-04-26 | Discharge: 2010-04-26 | Payer: Self-pay | Admitting: Obstetrics and Gynecology

## 2010-10-20 ENCOUNTER — Encounter: Payer: Self-pay | Admitting: Family Medicine

## 2010-10-20 ENCOUNTER — Encounter: Payer: Self-pay | Admitting: Obstetrics and Gynecology

## 2011-02-14 NOTE — Op Note (Signed)
NAMEIRAIDA, CRAGIN NO.:  [id number]   MEDICAL RECORD NO.:  [id number]          PATIENT TYPE:  AMB   LOCATION:  DSC                          FACILITY:  [facility]   PHYSICIAN:  Loreta Ave, M.D. DATE OF BIRTH:  1961/05/01   DATE OF PROCEDURE:  06/27/2004  DATE OF DISCHARGE:                                 OPERATIVE REPORT   PREOPERATIVE DIAGNOSIS:  Fixed mallet toe, second toe right foot.   POSTOPERATIVE DIAGNOSIS:  Fixed mallet toe, second toe right foot.   OPERATIVE PROCEDURE:  Correction of mallet toe with distal interphalangeal  joint fusion, right foot second toe, utilizing 20 mm mini-Acutrak screw.   SURGEON:  Loreta Ave, M.D.   ANESTHESIA:  General.   SPECIMENS:  None.   CULTURES:  None.   COMPLICATIONS:  None.   DRESSING:  Soft compressive with a wooden shoe.   PROCEDURE:  The patient brought to the operating room and after adequate  anesthesia had been obtained, tourniquet applied, right calf.  Prepped and  draped in the usual sterile fashion.  Exsanguinated with elevation and  Esmarch.  Tourniquet inflated to 250 mmHg.  Although she has some dynamic  clawing at the PIP joint of all of her toes, there are no other fixed  deformities except for the second toe, where she has a 90 degree fixed  mallet toe.  A transverse incision in the dorsal aspect DIP joint staying  away from the nail bed.  Skin and subcutaneous tissue divided.  A portion of  the extensor tendon and capsule were removed.  Joint exposed.  Denuded down  to healthy bleeding bone.  The toe was then corrected.  A small incision in  the tip of the toe.  A K-wire for the mini-Acutrak screw passed across the  DIP joint, confirming excellent straight alignment on AP and lateral view  visually and fluoroscopically, measured, predrilled, and then solidly fixed  with a 20 mm Acutrak screw that was buried down into the distal phalanx.  Overall correction and stability excellent.   Wound irrigated. The distal  incision closed with nylon.  The other incision closed with mattress nylon,  reefing the extensor with the suture.  A digital block utilizing Marcaine  without epinephrine was then placed.  A sterile compressive dressing  applied.  Tourniquet inflated and removed.  Wooden shoe applied.  Anesthesia  reversed.  Brought to the recovery room.  Tolerated the surgery well, no  complications.     Valentino Saxon  DFM/MEDQ  D:  06/27/2004  T:  06/27/2004  Job:  [id number]

## 2011-02-14 NOTE — Op Note (Signed)
[place]. [facility]  Patient:    Rhonda Espinoza, Rhonda Espinoza                        MRN: [id number] Proc. Date: 10/29/00 Adm. Date:  [id number] Attending:  Bennye Alm                           Operative Report  PREOPERATIVE DIAGNOSIS:  Painful varicose veins of the left lower extremity.  POSTOPERATIVE DIAGNOSIS:  Painful varicose veins of the left lower extremity.  PROCEDURE: 1. Ligation and stripping of left thigh, greater saphenous vein. 2. Segmental excision of varicose of the left lower extremity.  SURGEON:  Di Kindle. Edilia Bo, M.D.  ASSISTANT:  Sherrie George, P.A.  ANESTHESIA:  General.  INDICATIONS:  This is a 50 year old woman who presented with painful varicose veins of both lower extremities.  As these have been quite bothersome she elected to proceed with surgical excision.  She had evidence of incompetence of the greater saphenous vein on the left and therefore it was felt that sclerotherapy would not be effective given the underlying problem with venous hypertension.  It was recommended that we strip out her thigh, greater saphenous vein on the left and do segmental excision of the varicosities on the left leg.  The procedure and potential complications were discussed with the patient in the office preoperatively.  PROCEDURE IN DETAIL:  The patient was taken to the operating room and received a general anesthetic.  The left lower extremity was prepped and draped in the usual sterile fashion.  A small oblique excision was made on the left groin through which the saphenofemoral junction was dissected free.  Multiple branches were divided between 3-0 silk ties. The vein was suture ligated proximally and then the venotomy was made and the stripper introduced into the vein without difficulty and passed to the distal thigh.  At this point we metastatic a competent valve and stopped at this level.  A separate longitudinal incision was  made over this area and the vein was dissected free and ligated distally.  The stripper was then passed through the vein and secured with a 2-0 silk ties.  Next, the veins had been marked preoperatively in the recovery room and using multiple small longitudinal incisions segmental excisions were made of the varicose veins of the left lower extremity.  Pressure was held for hemostasis. All these wounds were closed with interrupted 4-0 Vicryls.  The left thigh greater saphenous vein was then stripped and pressure held for hemostasis. The patient had been placed in Trendelenburg.  Next the groin wound was closed with a deep layer of 3-0 Vicryl and the subcutaneous layer with 4-0 Vicryl and the incision for the exit site on the stripping was done with a 3-0 Vicryl and 4-0 Vicryl.  Sterile dressings were applied and a 4 inch and a 6 inch Ace were applied.  The patient tolerated the procedure well and was transferred to the recovery room in satisfactory condition.  All needle and sponge counts were correct. DD:  10/29/00 TD:  10/29/00 Job: [id number]

## 2011-02-14 NOTE — Op Note (Signed)
[place]. [facility]  Patient:    Rhonda Espinoza, Rhonda Espinoza Visit Number: [id number] MRN: [id number]          Service Type: DSU Location: [facility] 2899 [id number] Attending Physician:  Bennye Alm Dictated by:   Di Kindle Edilia Bo, M.D. Proc. Date: 06/03/01 Admit Date:  06/03/2001 Discharge Date: 06/03/2001                             Operative Report  PREOPERATIVE DIAGNOSIS:  Painful varicose veins to the right lower extremity.  POSTOPERATIVE DIAGNOSIS:  Painful varicose veins to the right lower extremity.  OPERATION PERFORMED: Segmental excision of varicosities of the right lower extremity.  SURGEON:  Di Kindle. Edilia Bo, M.D.  ASSISTANT: Nurse.  ANESTHESIA:  General.  DESCRIPTION OF PROCEDURE:  The patient was taken to the operating room after the varicose veins of the right lower extremity were marked with a pen with the patient standing.  Next, the patient received a general anesthetic.  The right lower extremity was prepped and draped in the usual sterile fashion.  Of note on her preoperative duplex there was no incompetence of the lesser saphenous or greater saphenous vein of the right leg.  Through a small 5 mm incision, incisions were made over the varicosities and these were teased out and bluntly excised.  Pressure was held for hemostasis.  Each of these incisions was closed with a 4-0 Vicryl suture.  Short Steri-Strips were applied.  Sterile dressings were applied.  The patient tolerated the procedure well and was transferred to the recovery room in satisfactory condition.  All needle and sponge counts were correct. Dictated by:   Di Kindle Edilia Bo, M.D. Attending Physician:  Bennye Alm DD:  06/03/01 TD:  06/03/01 Job: [id number]

## 2011-05-06 ENCOUNTER — Other Ambulatory Visit (HOSPITAL_COMMUNITY): Payer: Self-pay | Admitting: Obstetrics and Gynecology

## 2011-05-06 DIAGNOSIS — Z1231 Encounter for screening mammogram for malignant neoplasm of breast: Secondary | ICD-10-CM

## 2011-05-22 ENCOUNTER — Ambulatory Visit (HOSPITAL_COMMUNITY): Payer: Self-pay

## 2011-05-27 ENCOUNTER — Ambulatory Visit (HOSPITAL_COMMUNITY): Payer: Self-pay

## 2011-06-23 DIAGNOSIS — E781 Pure hyperglyceridemia: Secondary | ICD-10-CM | POA: Insufficient documentation

## 2011-06-23 DIAGNOSIS — E785 Hyperlipidemia, unspecified: Secondary | ICD-10-CM | POA: Insufficient documentation

## 2011-07-03 DIAGNOSIS — E559 Vitamin D deficiency, unspecified: Secondary | ICD-10-CM | POA: Insufficient documentation

## 2011-07-24 ENCOUNTER — Ambulatory Visit (HOSPITAL_COMMUNITY)
Admission: RE | Admit: 2011-07-24 | Discharge: 2011-07-24 | Disposition: A | Discharge status: Home / Self Care | Payer: 59 | Source: Ambulatory Visit | Attending: Obstetrics and Gynecology | Admitting: Obstetrics and Gynecology

## 2011-07-24 DIAGNOSIS — Z1231 Encounter for screening mammogram for malignant neoplasm of breast: Secondary | ICD-10-CM | POA: Insufficient documentation

## 2011-11-10 ENCOUNTER — Other Ambulatory Visit: Payer: Self-pay | Admitting: Family Medicine

## 2011-11-14 ENCOUNTER — Other Ambulatory Visit: Payer: 59

## 2011-11-17 ENCOUNTER — Ambulatory Visit
Admission: RE | Admit: 2011-11-17 | Discharge: 2011-11-17 | Disposition: A | Discharge status: Home / Self Care | Payer: 59 | Source: Ambulatory Visit | Attending: Family Medicine | Admitting: Family Medicine

## 2011-11-17 IMAGING — RF DG UGI W/ HIGH DENSITY W/KUB
19 of 24 series · 19 of 24 positions shown · non-contrast
Comparison: [DATE] CT

CLINICAL DATA: Dysphagia.

UPPER GI SERIES WITH KUB
TECHNIQUE: Routine upper GI series was performed with thin and
high density barium.
Fluoroscopy Time: 5.1 minutes

[Series 1: run · 1 of 2 slices shown (1 of 19)]
[im 1/2]
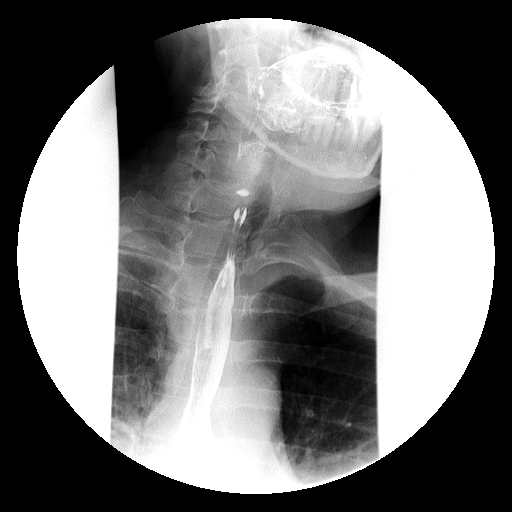

[Series 2: run · 1 of 2 slices shown (2 of 19)]
[im 1/2]
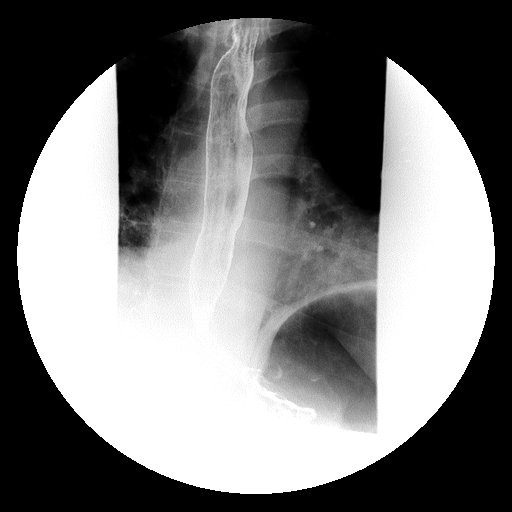

[Series 4: run · 1 of 3 slices shown (3 of 19)]
[im 1/3]
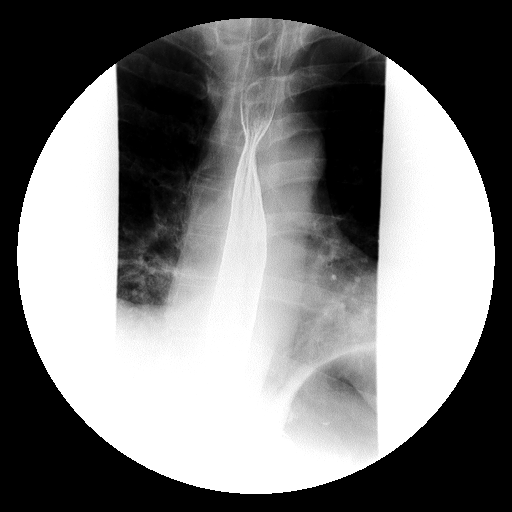

[Series 5: run · 1 of 1 slices shown (4 of 19)]
[im 1/1]
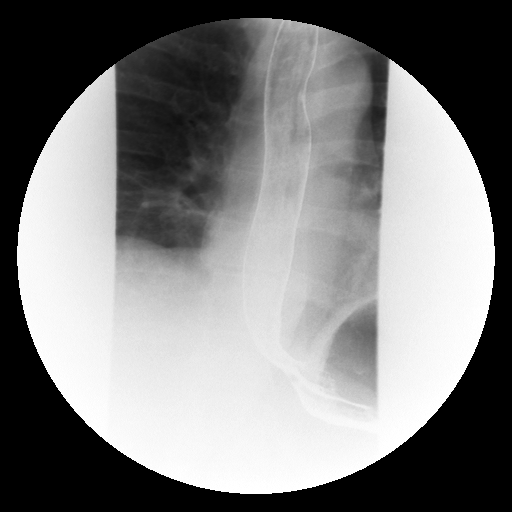

[Series 6: run · 1 of 1 slices shown (5 of 19)]
[im 1/1]
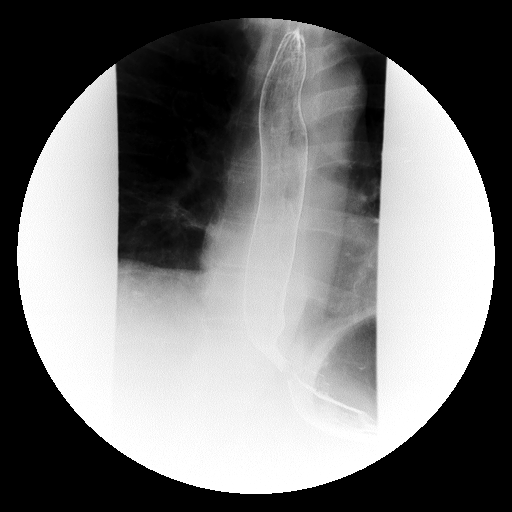

[Series 7: run · 1 of 1 slices shown (6 of 19)]
[im 1/1]
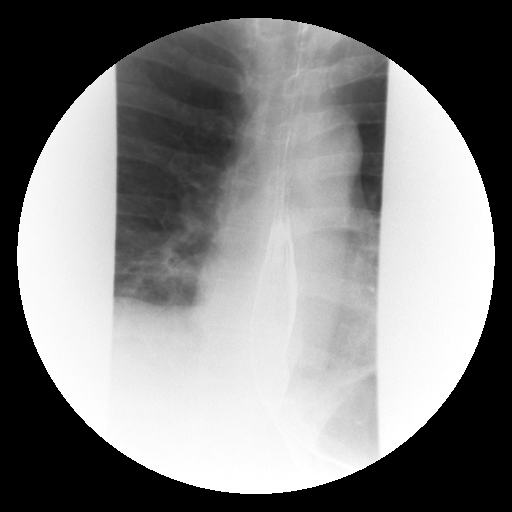

[Series 9: run · 1 of 1 slices shown (7 of 19)]
[im 1/1]
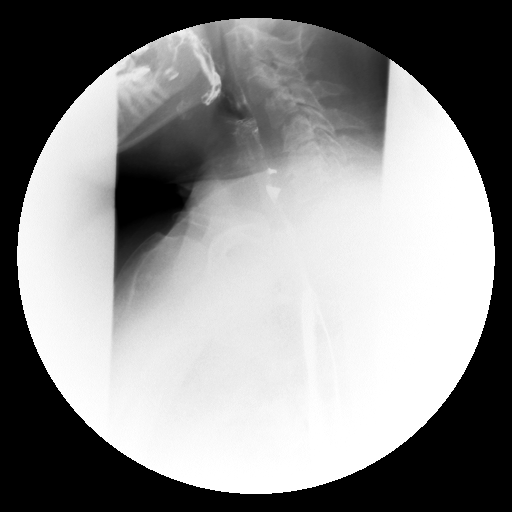

[Series 10: run · 1 of 1 slices shown (8 of 19)]
[im 1/1]
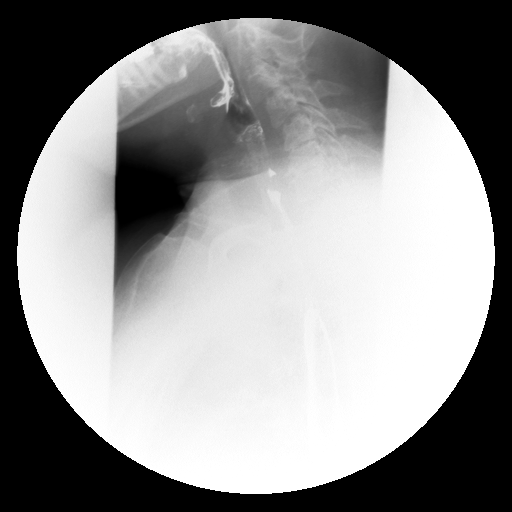

[Series 12: run · 1 of 1 slices shown (9 of 19)]
[im 1/1]
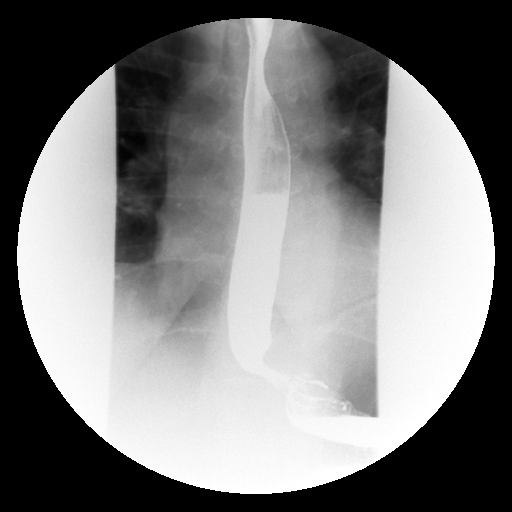

[Series 14: run · 1 of 1 slices shown (10 of 19)]
[im 1/1]
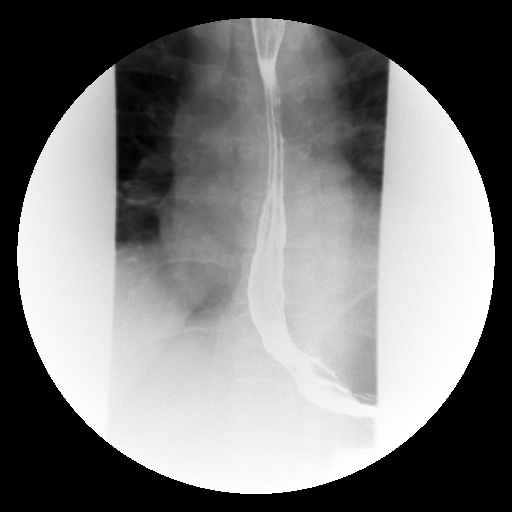

[Series 15: run · 1 of 1 slices shown (11 of 19)]
[im 1/1]
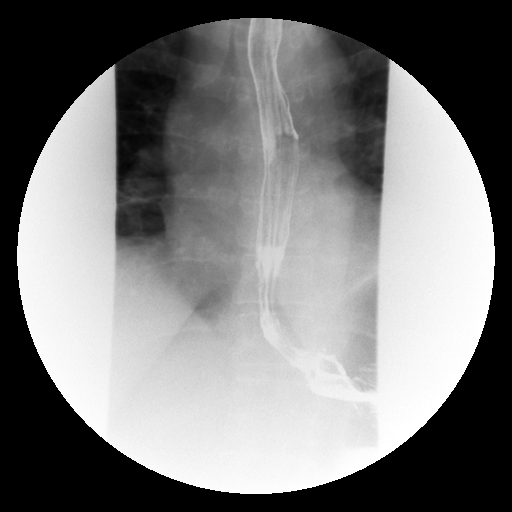

[Series 16: run · 1 of 1 slices shown (12 of 19)]
[im 1/1]
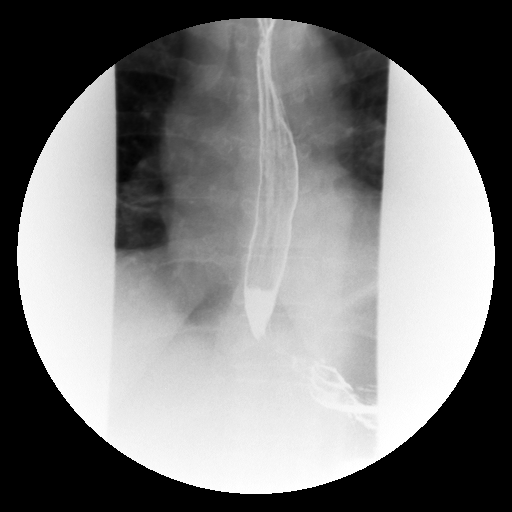

[Series 17: run · 1 of 1 slices shown (13 of 19)]
[im 1/1]
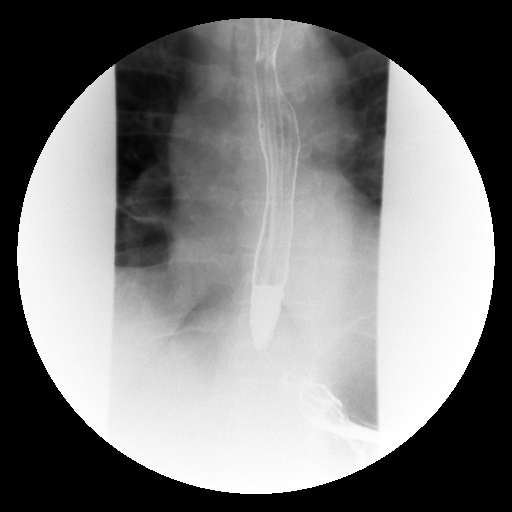

[Series 19: run · 1 of 1 slices shown (14 of 19)]
[im 1/1]
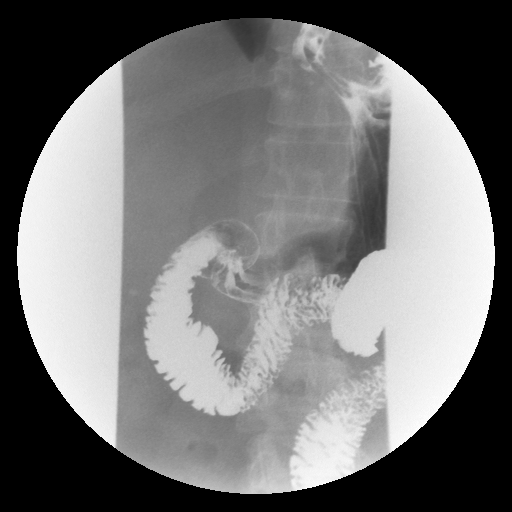

[Series 20: run · 1 of 1 slices shown (15 of 19)]
[im 1/1]
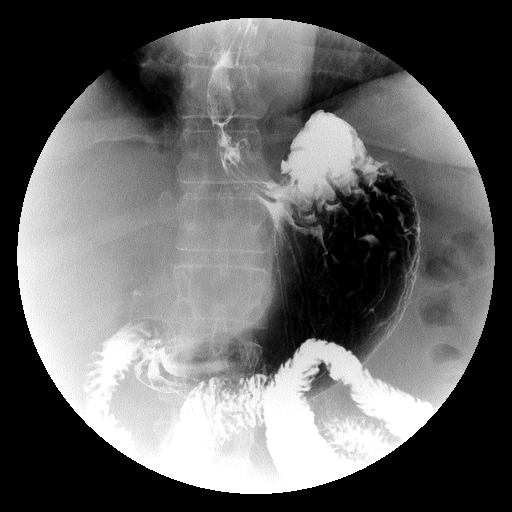

[Series 21: run · 1 of 1 slices shown (16 of 19)]
[im 1/1]
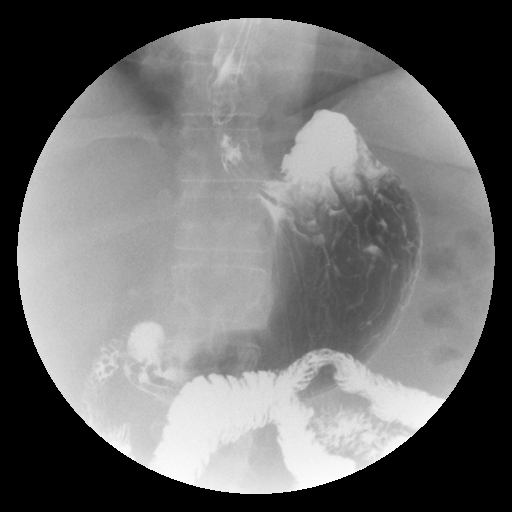

[Series 22: run · 1 of 1 slices shown (17 of 19)]
[im 1/1]
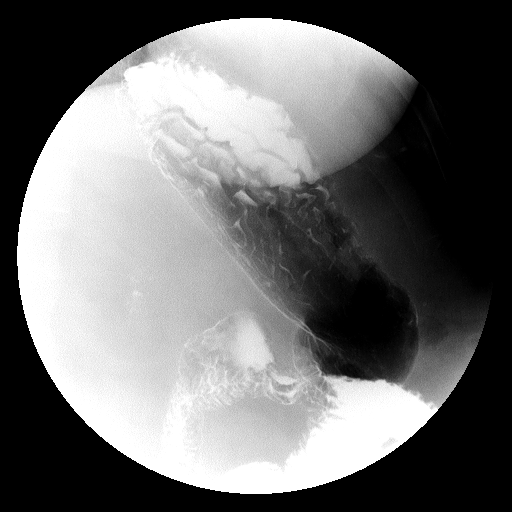

[Series 24: run · 1 of 1 slices shown (18 of 19)]
[im 1/1]
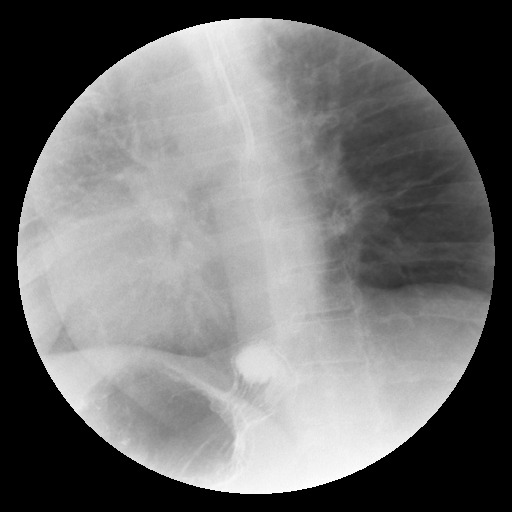

[Series 25: run · 1 of 1 slices shown (19 of 19)]
[im 1/1]
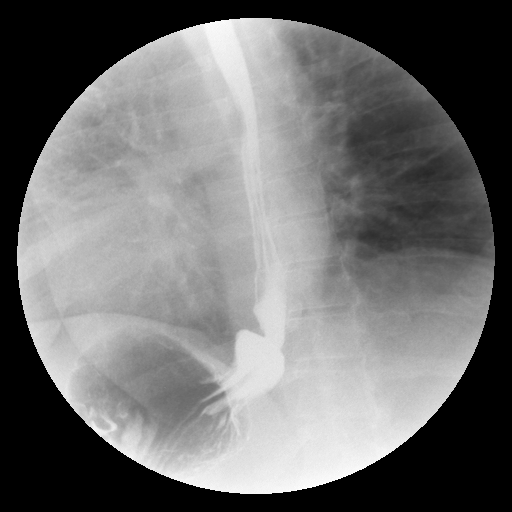

[19 of 24 positions shown; findings below may reference images not displayed]

FINDINGS: Fluoroscopic evaluation of swallowing demonstrates a
sliding type hiatal hernia.    Mild focal narrowing above the
hiatal hernia.  The 13 mm barium tablet sticks in this area and
causes mild symptoms.  Disruption of primary esophageal peristaltic
waves.

Otherwise, stomach, duodenal bulb and duodenal sweep are
unremarkable.
IMPRESSION: Nonspecific esophageal motility disorder.

Small hiatal hernia.  Mild focal narrowing within the distal
esophagus above the hiatal hernia with sticking of the 13 mm barium
tablet in this area.

## 2012-04-27 ENCOUNTER — Other Ambulatory Visit: Payer: Self-pay | Admitting: Family Medicine

## 2012-04-27 DIAGNOSIS — R1011 Right upper quadrant pain: Secondary | ICD-10-CM

## 2012-04-29 ENCOUNTER — Other Ambulatory Visit: Payer: 59

## 2012-04-29 ENCOUNTER — Ambulatory Visit
Admission: RE | Admit: 2012-04-29 | Discharge: 2012-04-29 | Disposition: A | Discharge status: Home / Self Care | Payer: 59 | Source: Ambulatory Visit | Attending: Family Medicine | Admitting: Family Medicine

## 2012-04-29 DIAGNOSIS — R1011 Right upper quadrant pain: Secondary | ICD-10-CM

## 2012-04-29 IMAGING — US US ABDOMEN LIMITED
1 series · 14 of 25 positions shown · non-contrast
Comparison: Upper GI of [DATE].  CT of [DATE].  Ultrasound
of [DATE].

CLINICAL DATA: Right upper quadrant abdominal pain for 2 months.

LIMITED ABDOMINAL ULTRASOUND - RIGHT UPPER QUADRANT

[Series 1: us abdomen limited · 0.30mm/px · 14 of 36 slices shown]
[im 1/36]
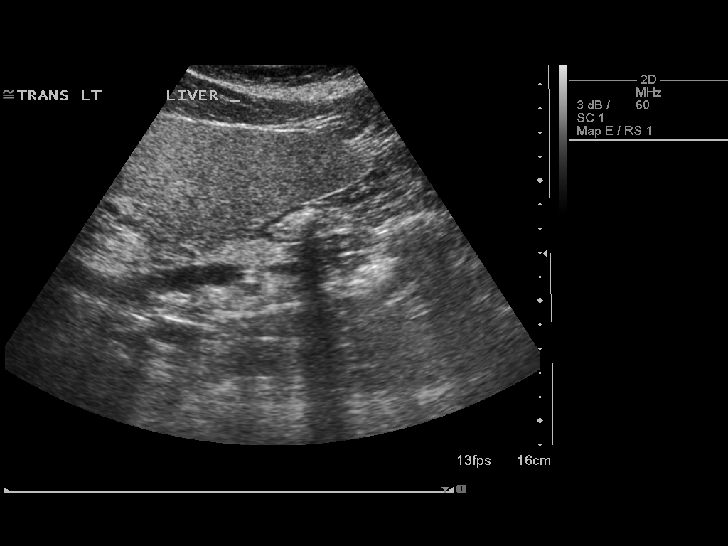
[im 3/36]
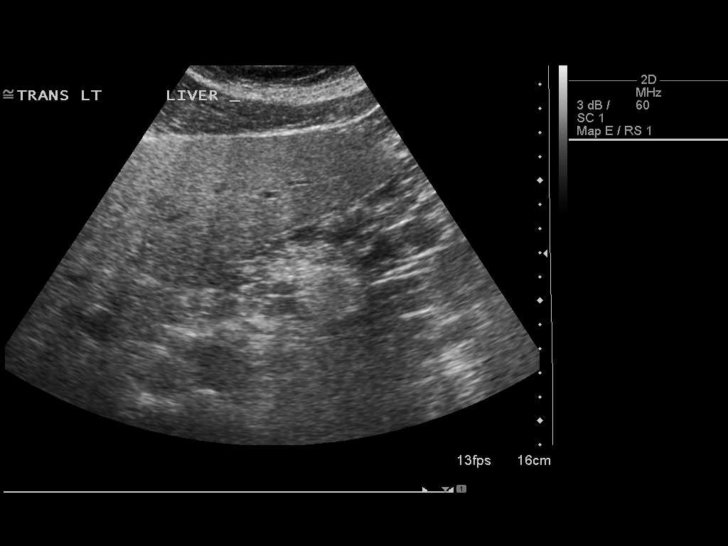
[im 6/36]
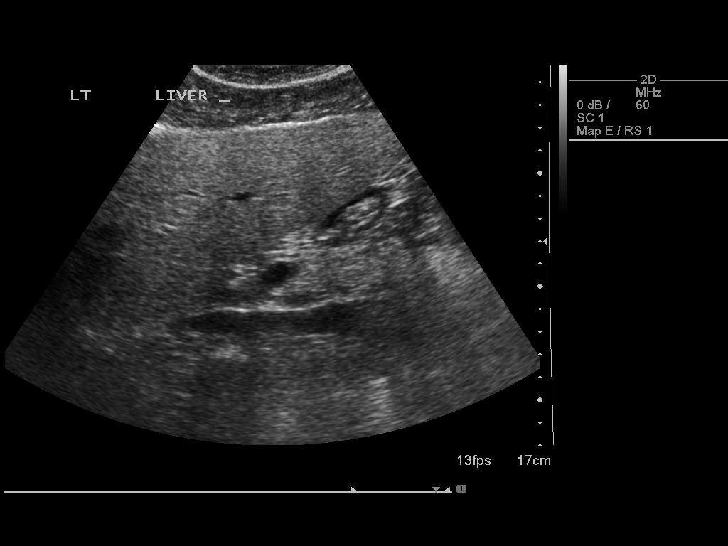
[im 9/36]
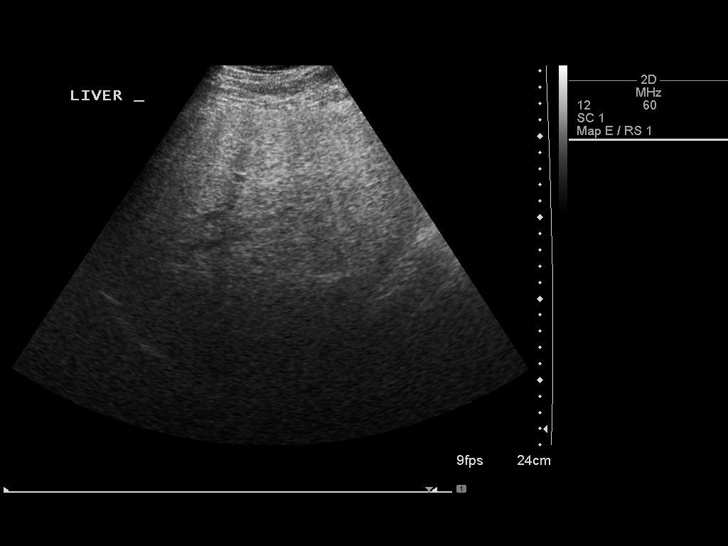
[im 12/36]
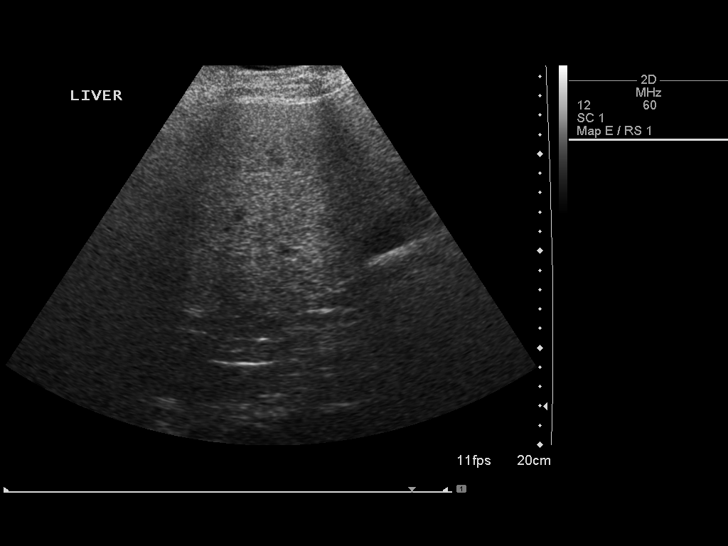
[im 14/36]
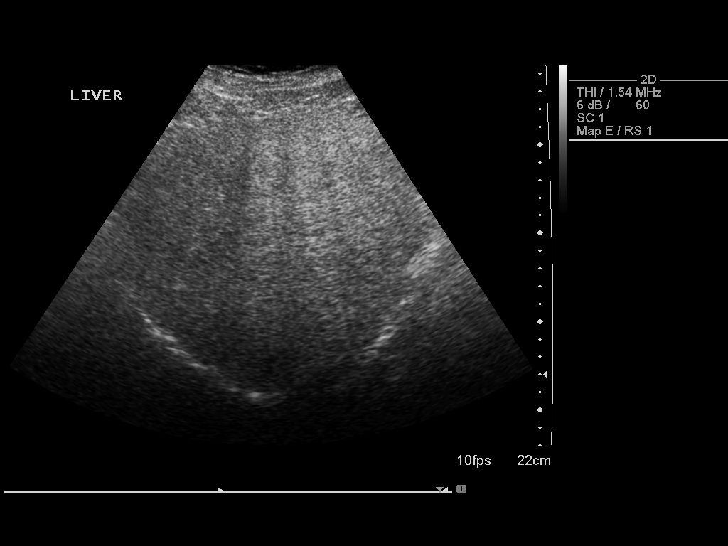
[im 17/36]
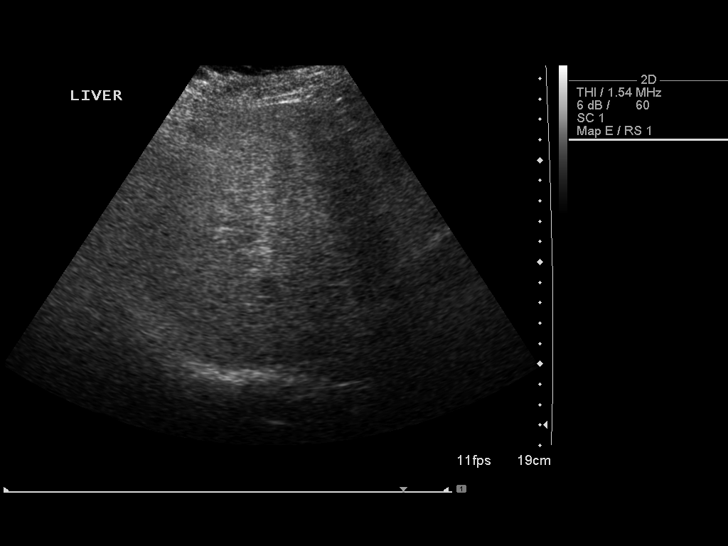
[im 19/36]
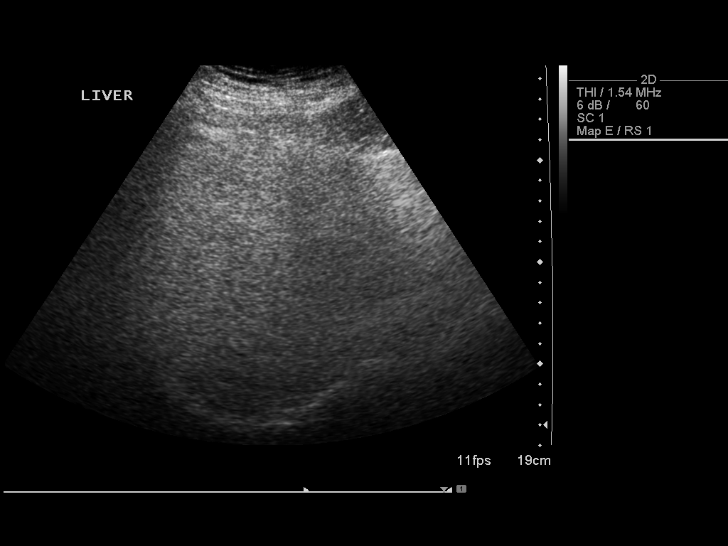
[im 22/36]
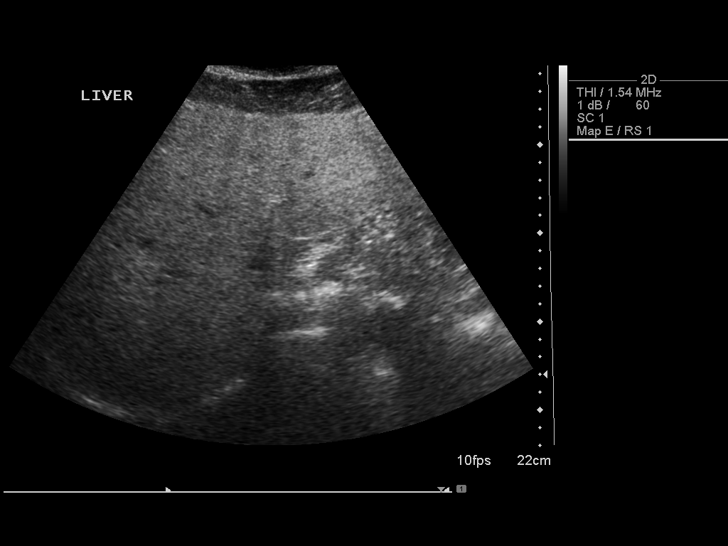
[im 24/36]
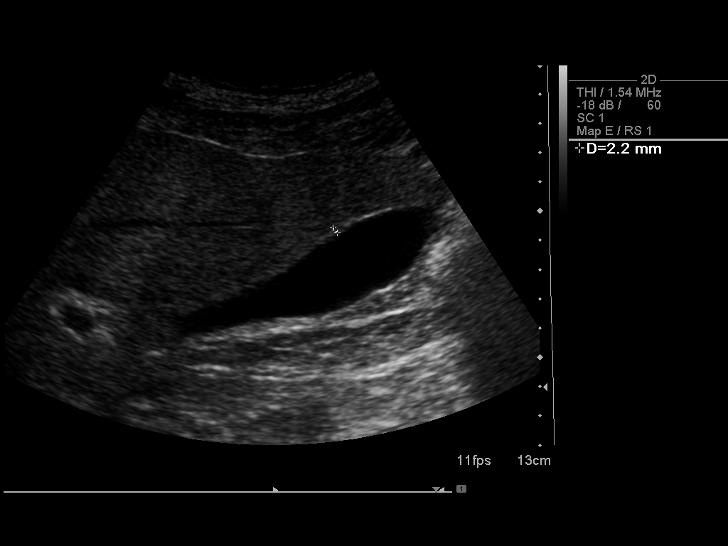
[im 27/36]
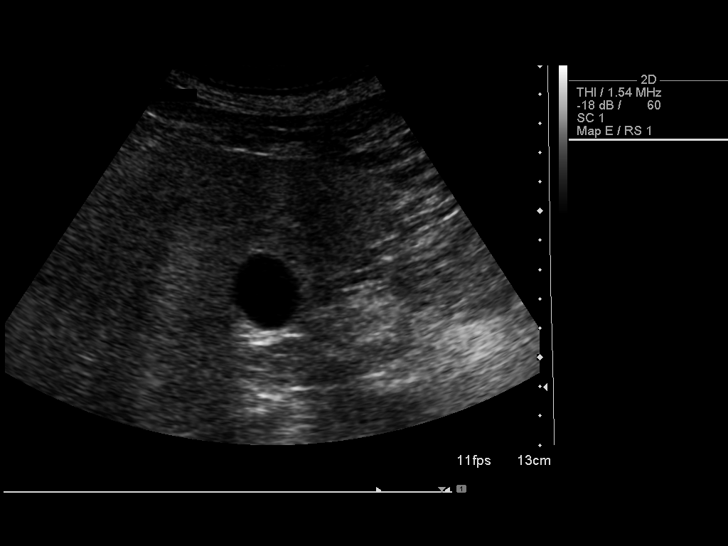
[im 30/36]
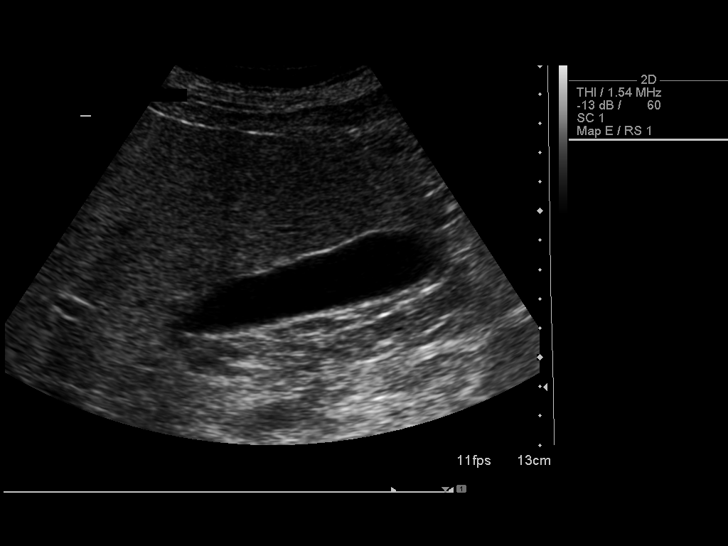
[im 33/36]
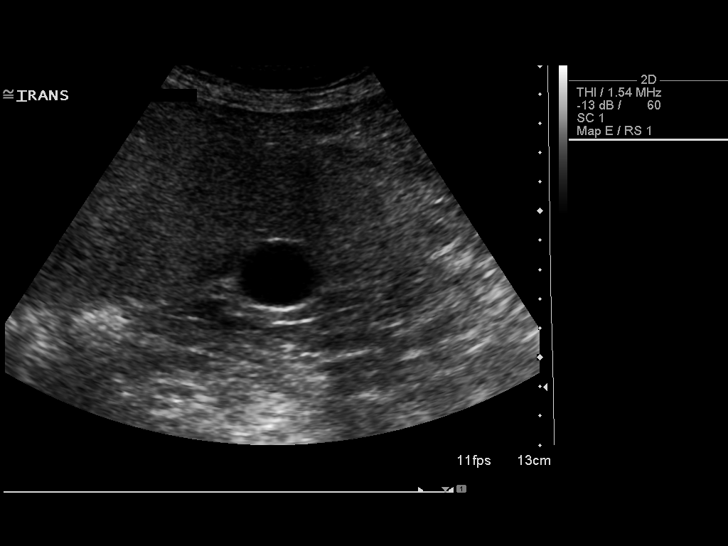
[im 36/36]
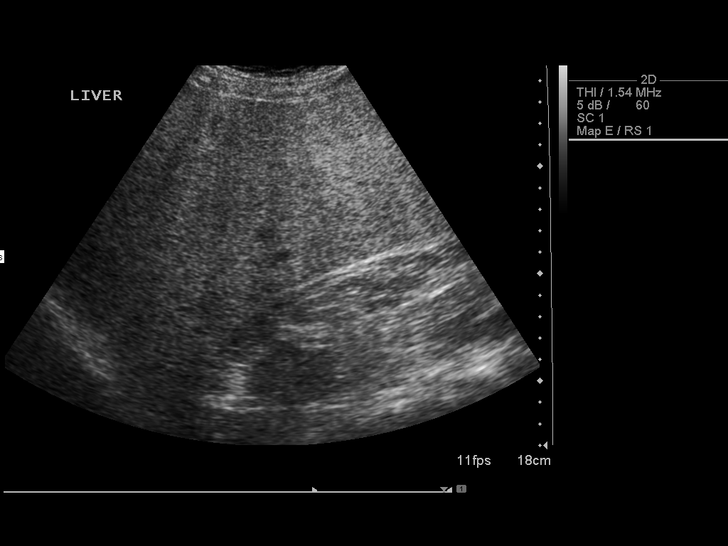

[14 of 25 positions shown; findings below may reference images not displayed]

FINDINGS: Gallbladder:  Normal, without wall thickening, stone, or
pericholecystic fluid.  Sonographic Murphy's sign was not elicited.

Common bile duct:  Normal, 2 mm.

Liver:  Moderately increasing echogenicity.  The hypoechoic area of
probable fat sparing described on the prior ultrasound is not
readily identified.
IMPRESSION: 1.  Hepatic steatosis, moderate.
2.  No explanation for pain or acute process in the abdomen.

## 2012-09-17 ENCOUNTER — Other Ambulatory Visit (HOSPITAL_COMMUNITY): Payer: Self-pay | Admitting: Family Medicine

## 2012-09-17 DIAGNOSIS — Z1231 Encounter for screening mammogram for malignant neoplasm of breast: Secondary | ICD-10-CM

## 2012-10-06 ENCOUNTER — Ambulatory Visit (HOSPITAL_COMMUNITY)
Admission: RE | Admit: 2012-10-06 | Discharge: 2012-10-06 | Disposition: A | Discharge status: Home / Self Care | Payer: 59 | Source: Ambulatory Visit | Attending: Family Medicine | Admitting: Family Medicine

## 2012-10-06 DIAGNOSIS — Z1231 Encounter for screening mammogram for malignant neoplasm of breast: Secondary | ICD-10-CM | POA: Insufficient documentation

## 2013-08-29 ENCOUNTER — Other Ambulatory Visit (HOSPITAL_COMMUNITY): Payer: Self-pay | Admitting: Family Medicine

## 2013-08-29 DIAGNOSIS — Z1231 Encounter for screening mammogram for malignant neoplasm of breast: Secondary | ICD-10-CM

## 2013-10-07 ENCOUNTER — Ambulatory Visit (HOSPITAL_COMMUNITY): Payer: 59

## 2013-10-14 ENCOUNTER — Ambulatory Visit (HOSPITAL_COMMUNITY)
Admission: RE | Admit: 2013-10-14 | Discharge: 2013-10-14 | Disposition: A | Discharge status: Home / Self Care | Payer: 59 | Source: Ambulatory Visit | Attending: Family Medicine | Admitting: Family Medicine

## 2013-10-14 DIAGNOSIS — Z1231 Encounter for screening mammogram for malignant neoplasm of breast: Secondary | ICD-10-CM | POA: Insufficient documentation

## 2014-01-31 DIAGNOSIS — R945 Abnormal results of liver function studies: Secondary | ICD-10-CM

## 2014-01-31 DIAGNOSIS — R7989 Other specified abnormal findings of blood chemistry: Secondary | ICD-10-CM | POA: Insufficient documentation

## 2014-01-31 DIAGNOSIS — R739 Hyperglycemia, unspecified: Secondary | ICD-10-CM | POA: Insufficient documentation

## 2014-01-31 DIAGNOSIS — M47816 Spondylosis without myelopathy or radiculopathy, lumbar region: Secondary | ICD-10-CM | POA: Insufficient documentation

## 2014-01-31 DIAGNOSIS — G4700 Insomnia, unspecified: Secondary | ICD-10-CM | POA: Insufficient documentation

## 2014-01-31 DIAGNOSIS — M62830 Muscle spasm of back: Secondary | ICD-10-CM | POA: Insufficient documentation

## 2014-03-14 ENCOUNTER — Ambulatory Visit: Payer: Self-pay | Admitting: Pain Medicine

## 2014-04-01 ENCOUNTER — Encounter (HOSPITAL_COMMUNITY): Payer: Self-pay | Admitting: Emergency Medicine

## 2014-04-01 ENCOUNTER — Emergency Department (HOSPITAL_COMMUNITY)
Admission: EM | Admit: 2014-04-01 | Discharge: 2014-04-01 | Disposition: A | Discharge status: Home / Self Care | Payer: BC Managed Care – PPO | Source: Home / Self Care

## 2014-04-01 DIAGNOSIS — M5432 Sciatica, left side: Secondary | ICD-10-CM

## 2014-04-01 DIAGNOSIS — M5430 Sciatica, unspecified side: Secondary | ICD-10-CM

## 2014-04-01 DIAGNOSIS — M545 Low back pain, unspecified: Secondary | ICD-10-CM

## 2014-04-01 HISTORY — DX: Unspecified thoracic, thoracolumbar and lumbosacral intervertebral disc disorder: M51.9

## 2014-04-01 MED ORDER — OXYCODONE-ACETAMINOPHEN 5-325 MG PO TABS: 1.0000 | ORAL_TABLET | Freq: Four times a day (QID) | ORAL | Status: DC | PRN

## 2014-04-01 MED ORDER — METHYLPREDNISOLONE 4 MG PO KIT: PACK | ORAL | Status: DC

## 2014-04-01 NOTE — ED Notes (Signed)
Having pain in back and into legs. History of disc issues in back, has had intervertebral injections for pain, and had done well until has resumed exercise for her back in pool

## 2014-04-01 NOTE — ED Provider Notes (Signed)
CSN: [id number]     Arrival date & time 04/01/14  1020 History   First MD Initiated Contact with Patient 04/01/14 1108     Chief Complaint  Patient presents with  . Back Pain   (Consider location/radiation/quality/duration/timing/severity/associated sxs/prior Treatment) HPI Comments: 53 year old female presents with left low back pain with radiation into the left buttock and thigh. It began approximately one week ago. She has had off and on low back pain for approximately 2 months. She has an appointment with the pain clinic early next week. She is requesting medications for relief of pain until she is able to get to that clinic. She states there is mild weakness to the left lower extremity.   Past Medical History  Diagnosis Date  . Disc disorder    History reviewed. No pertinent past surgical history. History reviewed. No pertinent family history. History  Substance Use Topics  . Smoking status: Never Smoker   . Smokeless tobacco: Not on file  . Alcohol Use: No   OB History   Grav Para Term Preterm Abortions TAB SAB Ect Mult Living                 Review of Systems  Constitutional: Negative for fever, chills and activity change.  HENT: Negative.   Respiratory: Negative.   Cardiovascular: Negative.   Musculoskeletal: Positive for back pain.       As per HPI  Skin: Negative for rash.  Neurological: Negative.     Allergies  Review of patient's allergies indicates no known allergies.  Home Medications   Prior to Admission medications   Medication Sig Start Date End Date Taking? Authorizing Provider  methylPREDNISolone (MEDROL DOSEPAK) 4 MG tablet As directed. Take with food. 04/01/14   Janne Napoleon, NP  oxyCODONE-acetaminophen (PERCOCET/ROXICET) 5-325 MG per tablet Take 1 tablet by mouth every 6 (six) hours as needed for severe pain. 04/01/14   Janne Napoleon, NP   BP 170/112  Pulse 87  Temp(Src) 97.8 F (36.6 C) (Oral)  Resp 18  SpO2 96%  LMP 06/30/2011 Physical Exam   Nursing note and vitals reviewed. Constitutional: She is oriented to person, place, and time. She appears well-developed and well-nourished. No distress.  HENT:  Head: Normocephalic and atraumatic.  Eyes: EOM are normal.  Neck: Normal range of motion. Neck supple.  Cardiovascular: Normal rate.   Pulmonary/Chest: Effort normal. No respiratory distress.  Musculoskeletal:  Tenderness in L low back, para lumbosacral musculature. No spinal tenderness or deformity.  Recent MRI finding of L4-L5 mild disk protrusion without spinal stenosis.  Neurological: She is alert and oriented to person, place, and time. No cranial nerve deficit.  Skin: Skin is warm and dry.    ED Course  Procedures (including critical care time) Labs Review Labs Reviewed - No data to display  Imaging Review No results found.   MDM   1. Sciatica, left   2. Left low back pain, with sciatica presence unspecified        Janne Napoleon, NP 04/01/14 1136

## 2014-04-01 NOTE — Discharge Instructions (Signed)
Back Pain, Adult °Low back pain is very common. About 1 in 5 people have back pain. The cause of low back pain is rarely dangerous. The pain often gets better over time. About half of people with a sudden onset of back pain feel better in just 2 weeks. About 8 in 10 people feel better by 6 weeks.  °CAUSES °Some common causes of back pain include: °· Strain of the muscles or ligaments supporting the spine. °· Wear and tear (degeneration) of the spinal discs. °· Arthritis. °· Direct injury to the back. °DIAGNOSIS °Most of the time, the direct cause of low back pain is not known. However, back pain can be treated effectively even when the exact cause of the pain is unknown. Answering your caregiver's questions about your overall health and symptoms is one of the most accurate ways to make sure the cause of your pain is not dangerous. If your caregiver needs more information, he or she may order lab work or imaging tests (X-rays or MRIs). However, even if imaging tests show changes in your back, this usually does not require surgery. °HOME CARE INSTRUCTIONS °For many people, back pain returns. Since low back pain is rarely dangerous, it is often a condition that people can learn to manage on their own.  °· Remain active. It is stressful on the back to sit or stand in one place. Do not sit, drive, or stand in one place for more than 30 minutes at a time. Take short walks on level surfaces as soon as pain allows. Try to increase the length of time you walk each day. °· Do not stay in bed. Resting more than 1 or 2 days can delay your recovery. °· Do not avoid exercise or work. Your body is made to move. It is not dangerous to be active, even though your back may hurt. Your back will likely heal faster if you return to being active before your pain is gone. °· Pay attention to your body when you  bend and lift. Many people have less discomfort when lifting if they bend their knees, keep the load close to their bodies, and  avoid twisting. Often, the most comfortable positions are those that put less stress on your recovering back. °· Find a comfortable position to sleep. Use a firm mattress and lie on your side with your knees slightly bent. If you lie on your back, put a pillow under your knees. °· Only take over-the-counter or prescription medicines as directed by your caregiver. Over-the-counter medicines to reduce pain and inflammation are often the most helpful. Your caregiver may prescribe muscle relaxant drugs. These medicines help dull your pain so you can more quickly return to your normal activities and healthy exercise. °· Put ice on the injured area. °¨ Put ice in a plastic bag. °¨ Place a towel between your skin and the bag. °¨ Leave the ice on for 15-20 minutes, 03-04 times a day for the first 2 to 3 days. After that, ice and heat may be alternated to reduce pain and spasms. °· Ask your caregiver about trying back exercises and gentle massage. This may be of some benefit. °· Avoid feeling anxious or stressed. Stress increases muscle tension and can worsen back pain. It is important to recognize when you are anxious or stressed and learn ways to manage it. Exercise is a great option. °SEEK MEDICAL CARE IF: °· You have pain that is not relieved with rest or medicine. °· You have pain that does not improve in 1 week. °· You have new symptoms. °· You are generally not feeling well. °SEEK   IMMEDIATE MEDICAL CARE IF:   You have pain that radiates from your back into your legs.  You develop new bowel or bladder control problems.  You have unusual weakness or numbness in your arms or legs.  You develop nausea or vomiting.  You develop abdominal pain.  You feel faint. Document Released: 09/15/2005 Document Revised: 03/16/2012 Document Reviewed: 02/03/2011 [facility] Patient Information 2015 [place], [place]. This information is not intended to replace advice given to you by your health care provider. Make sure you  discuss any questions you have with your health care provider.  Radicular Pain Radicular pain in either the arm or leg is usually from a bulging or herniated disk in the spine. A piece of the herniated disk may press against the nerves as the nerves exit the spine. This causes pain which is felt at the tips of the nerves down the arm or leg. Other causes of radicular pain may include:  Fractures.  Heart disease.  Cancer.  An abnormal and usually degenerative state of the nervous system or nerves (neuropathy). Diagnosis may require CT or MRI scanning to determine the primary cause.  Nerves that start at the neck (nerve roots) may cause radicular pain in the outer shoulder and arm. It can spread down to the thumb and fingers. The symptoms vary depending on which nerve root has been affected. In most cases radicular pain improves with conservative treatment. Neck problems may require physical therapy, a neck collar, or cervical traction. Treatment may take many weeks, and surgery may be considered if the symptoms do not improve.  Conservative treatment is also recommended for sciatica. Sciatica causes pain to radiate from the lower back or buttock area down the leg into the foot. Often there is a history of back problems. Most patients with sciatica are better after 2 to 4 weeks of rest and other supportive care. Short term bed rest can reduce the disk pressure considerably. Sitting, however, is not a good position since this increases the pressure on the disk. You should avoid bending, lifting, and all other activities which make the problem worse. Traction can be used in severe cases. Surgery is usually reserved for patients who do not improve within the first months of treatment. Only take over-the-counter or prescription medicines for pain, discomfort, or fever as directed by your caregiver. Narcotics and muscle relaxants may help by relieving more severe pain and spasm and by providing mild  sedation. Cold or massage can give significant relief. Spinal manipulation is not recommended. It can increase the degree of disc protrusion. Epidural steroid injections are often effective treatment for radicular pain. These injections deliver medicine to the spinal nerve in the space between the protective covering of the spinal cord and back bones (vertebrae). Your caregiver can give you more information about steroid injections. These injections are most effective when given within two weeks of the onset of pain.  You should see your caregiver for follow up care as recommended. A program for neck and back injury rehabilitation with stretching and strengthening exercises is an important part of management.  SEEK IMMEDIATE MEDICAL CARE IF:  You develop increased pain, weakness, or numbness in your arm or leg.  You develop difficulty with bladder or bowel control.  You develop abdominal pain. Document Released: 10/23/2004 Document Revised: 12/08/2011 Document Reviewed: 01/08/2009 [facility] Patient Information 2015 [place], [place]. This information is not intended to replace advice given to you by your health care provider. Make sure you discuss any questions you have with  your health care provider.  Sciatica Sciatica is pain, weakness, numbness, or tingling along the path of the sciatic nerve. The nerve starts in the lower back and runs down the back of each leg. The nerve controls the muscles in the lower leg and in the back of the knee, while also providing sensation to the back of the thigh, lower leg, and the sole of your foot. Sciatica is a symptom of another medical condition. For instance, nerve damage or certain conditions, such as a herniated disk or bone spur on the spine, pinch or put pressure on the sciatic nerve. This causes the pain, weakness, or other sensations normally associated with sciatica. Generally, sciatica only affects one side of the body. CAUSES   Herniated or slipped  disc.  Degenerative disk disease.  A pain disorder involving the narrow muscle in the buttocks (piriformis syndrome).  Pelvic injury or fracture.  Pregnancy.  Tumor (rare). SYMPTOMS  Symptoms can vary from mild to very severe. The symptoms usually travel from the low back to the buttocks and down the back of the leg. Symptoms can include:  Mild tingling or dull aches in the lower back, leg, or hip.  Numbness in the back of the calf or sole of the foot.  Burning sensations in the lower back, leg, or hip.  Sharp pains in the lower back, leg, or hip.  Leg weakness.  Severe back pain inhibiting movement. These symptoms may get worse with coughing, sneezing, laughing, or prolonged sitting or standing. Also, being overweight may worsen symptoms. DIAGNOSIS  Your caregiver will perform a physical exam to look for common symptoms of sciatica. He or she may ask you to do certain movements or activities that would trigger sciatic nerve pain. Other tests may be performed to find the cause of the sciatica. These may include:  Blood tests.  X-rays.  Imaging tests, such as an MRI or CT scan. TREATMENT  Treatment is directed at the cause of the sciatic pain. Sometimes, treatment is not necessary and the pain and discomfort goes away on its own. If treatment is needed, your caregiver may suggest:  Over-the-counter medicines to relieve pain.  Prescription medicines, such as anti-inflammatory medicine, muscle relaxants, or narcotics.  Applying heat or ice to the painful area.  Steroid injections to lessen pain, irritation, and inflammation around the nerve.  Reducing activity during periods of pain.  Exercising and stretching to strengthen your abdomen and improve flexibility of your spine. Your caregiver may suggest losing weight if the extra weight makes the back pain worse.  Physical therapy.  Surgery to eliminate what is pressing or pinching the nerve, such as a bone spur or part  of a herniated disk. HOME CARE INSTRUCTIONS   Only take over-the-counter or prescription medicines for pain or discomfort as directed by your caregiver.  Apply ice to the affected area for 20 minutes, 3-4 times a day for the first 48-72 hours. Then try heat in the same way.  Exercise, stretch, or perform your usual activities if these do not aggravate your pain.  Attend physical therapy sessions as directed by your caregiver.  Keep all follow-up appointments as directed by your caregiver.  Do not wear high heels or shoes that do not provide proper support.  Check your mattress to see if it is too soft. A firm mattress may lessen your pain and discomfort. SEEK IMMEDIATE MEDICAL CARE IF:   You lose control of your bowel or bladder (incontinence).  You have increasing weakness  in the lower back, pelvis, buttocks, or legs.  You have redness or swelling of your back.  You have a burning sensation when you urinate.  You have pain that gets worse when you lie down or awakens you at night.  Your pain is worse than you have experienced in the past.  Your pain is lasting longer than 4 weeks.  You are suddenly losing weight without reason. MAKE SURE YOU:  Understand these instructions.  Will watch your condition.  Will get help right away if you are not doing well or get worse. Document Released: 09/09/2001 Document Revised: 03/16/2012 Document Reviewed: 01/25/2012 [facility] Patient Information 2015 [place], [place]. This information is not intended to replace advice given to you by your health care provider. Make sure you discuss any questions you have with your health care provider.

## 2014-04-01 NOTE — ED Provider Notes (Signed)
Medical screening examination/treatment/procedure(s) were performed by resident physician or non-physician practitioner and as supervising physician I was immediately available for consultation/collaboration.   Pauline Good MD.   Billy Fischer, MD 04/01/14 [phone]

## 2014-04-04 ENCOUNTER — Ambulatory Visit: Payer: Self-pay | Admitting: Pain Medicine

## 2014-04-27 ENCOUNTER — Ambulatory Visit: Payer: Self-pay | Admitting: Pain Medicine

## 2014-07-11 ENCOUNTER — Ambulatory Visit: Payer: Self-pay | Admitting: Pain Medicine

## 2014-08-17 ENCOUNTER — Other Ambulatory Visit: Payer: Self-pay | Admitting: Obstetrics and Gynecology

## 2014-08-21 LAB — CYTOLOGY - PAP

## 2014-12-04 ENCOUNTER — Other Ambulatory Visit (HOSPITAL_COMMUNITY): Payer: Self-pay | Admitting: Family Medicine

## 2014-12-04 DIAGNOSIS — Z1231 Encounter for screening mammogram for malignant neoplasm of breast: Secondary | ICD-10-CM

## 2014-12-12 ENCOUNTER — Ambulatory Visit (HOSPITAL_COMMUNITY)
Admission: RE | Admit: 2014-12-12 | Discharge: 2014-12-12 | Disposition: A | Discharge status: Home / Self Care | Payer: BLUE CROSS/BLUE SHIELD | Source: Ambulatory Visit | Attending: Family Medicine | Admitting: Family Medicine

## 2014-12-12 DIAGNOSIS — Z1231 Encounter for screening mammogram for malignant neoplasm of breast: Secondary | ICD-10-CM

## 2014-12-12 IMAGING — MG MM SCREENING BREAST TOMO BILATERAL
6 of 10 series · 6 of 30 positions shown · non-contrast
Comparison: Previous exam(s).

CLINICAL DATA: Screening.

EXAM:
DIGITAL SCREENING BILATERAL MAMMOGRAM WITH 3D TOMO WITH CAD

[L MLO]
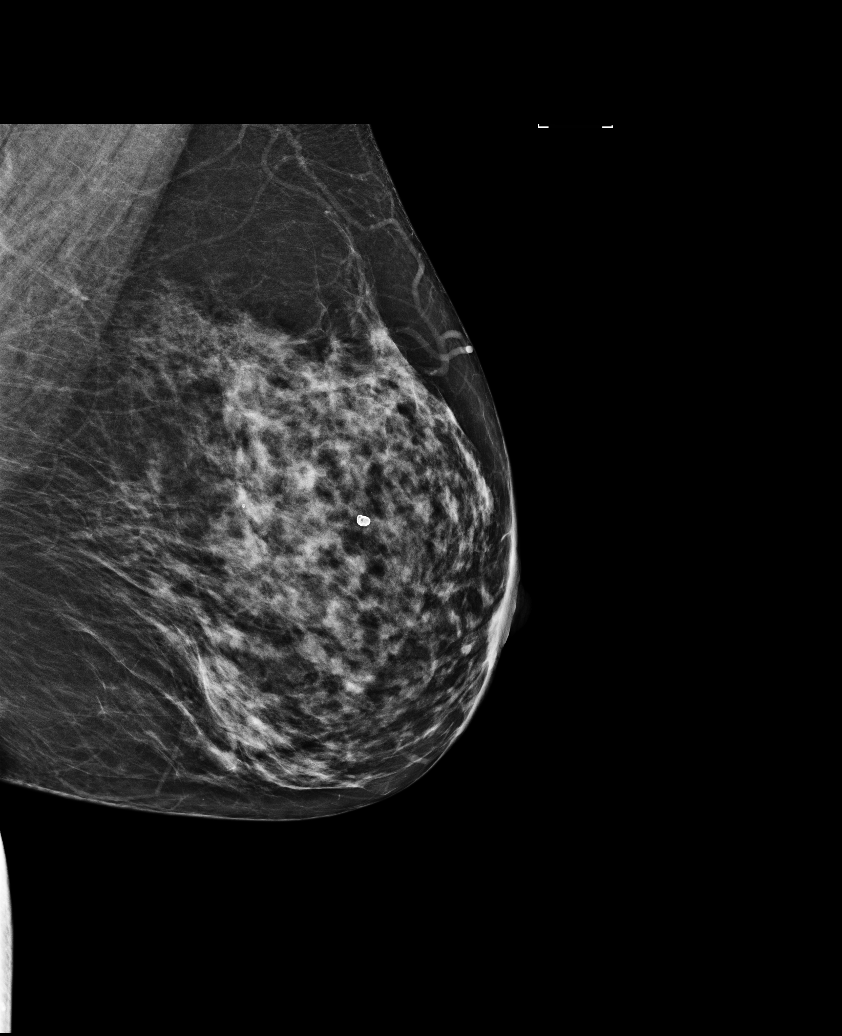

[R MLO]
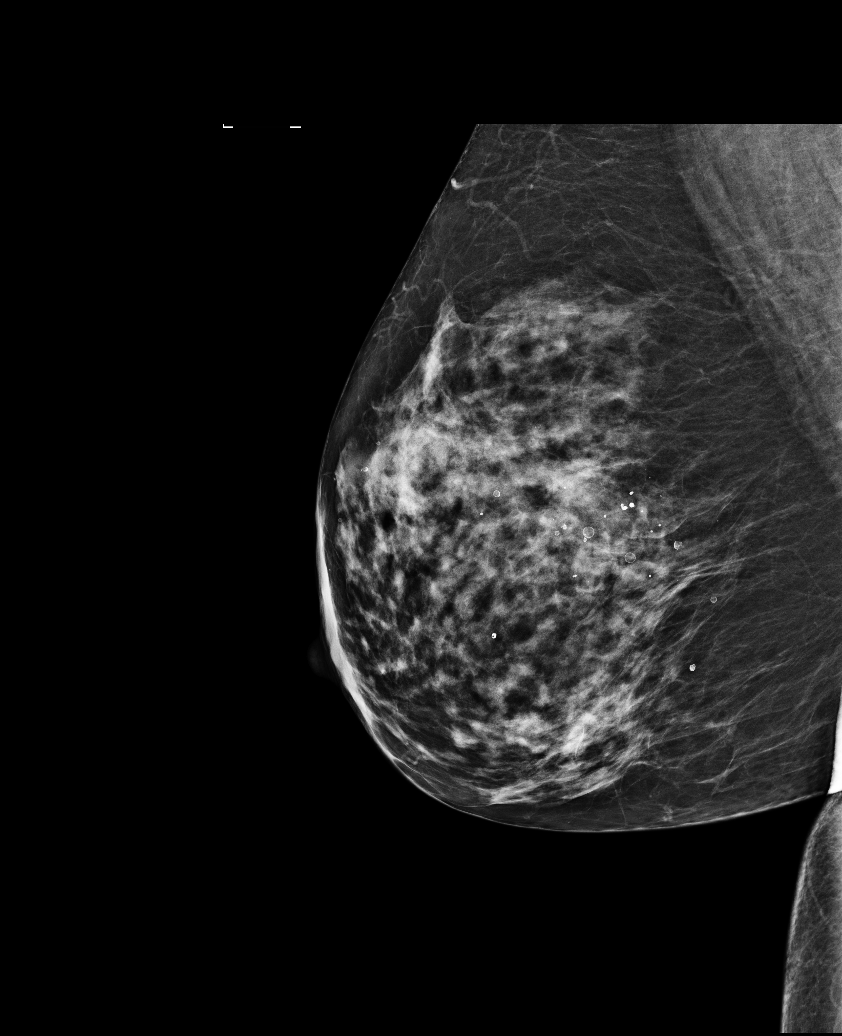

[R CC (1 of 2)]
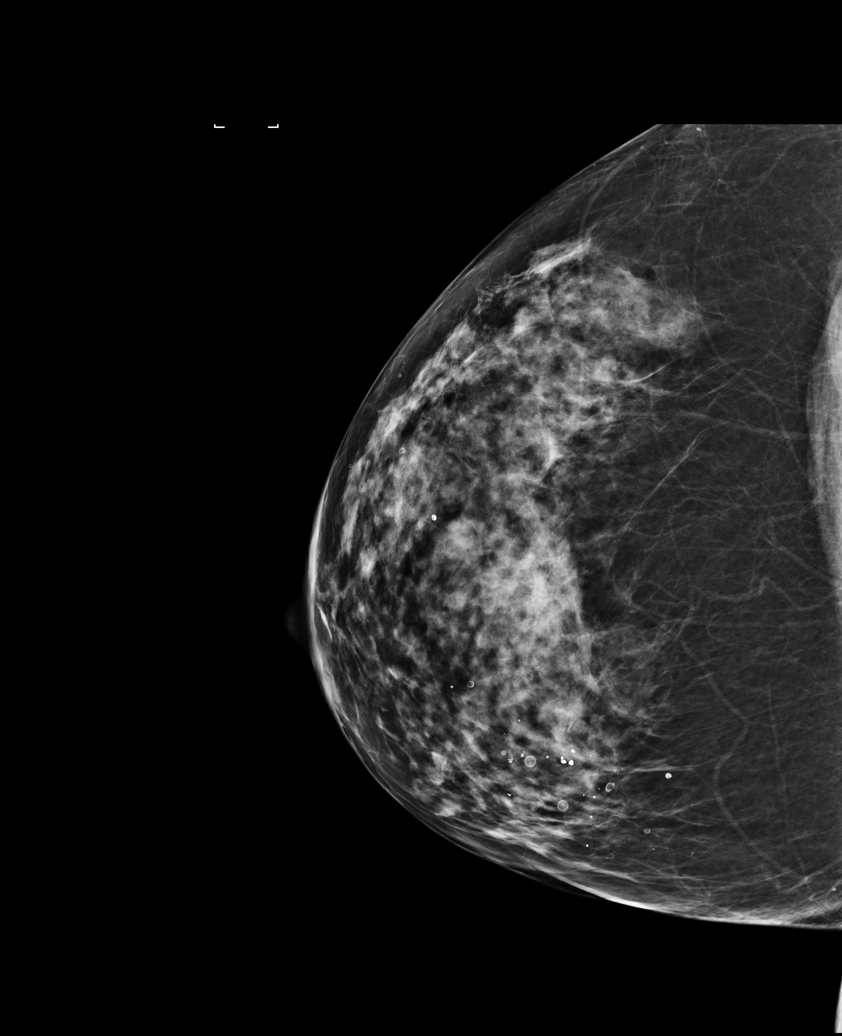

[R CC (2 of 2)]
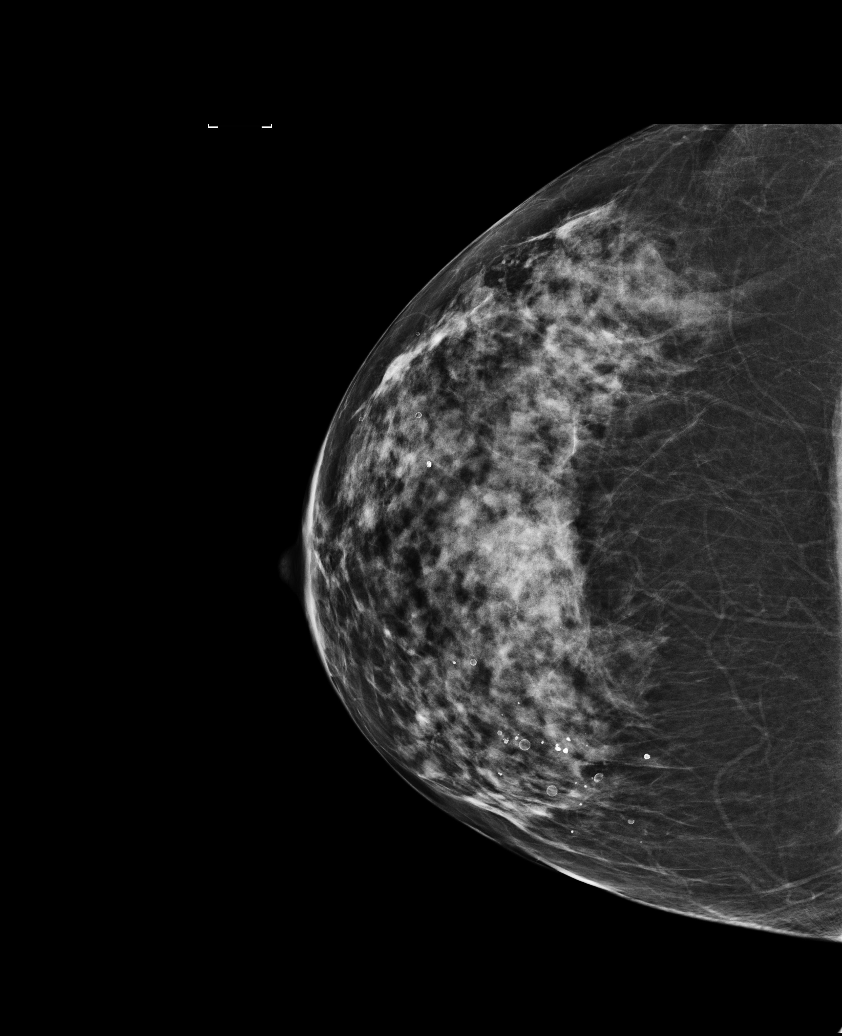

[L CC]
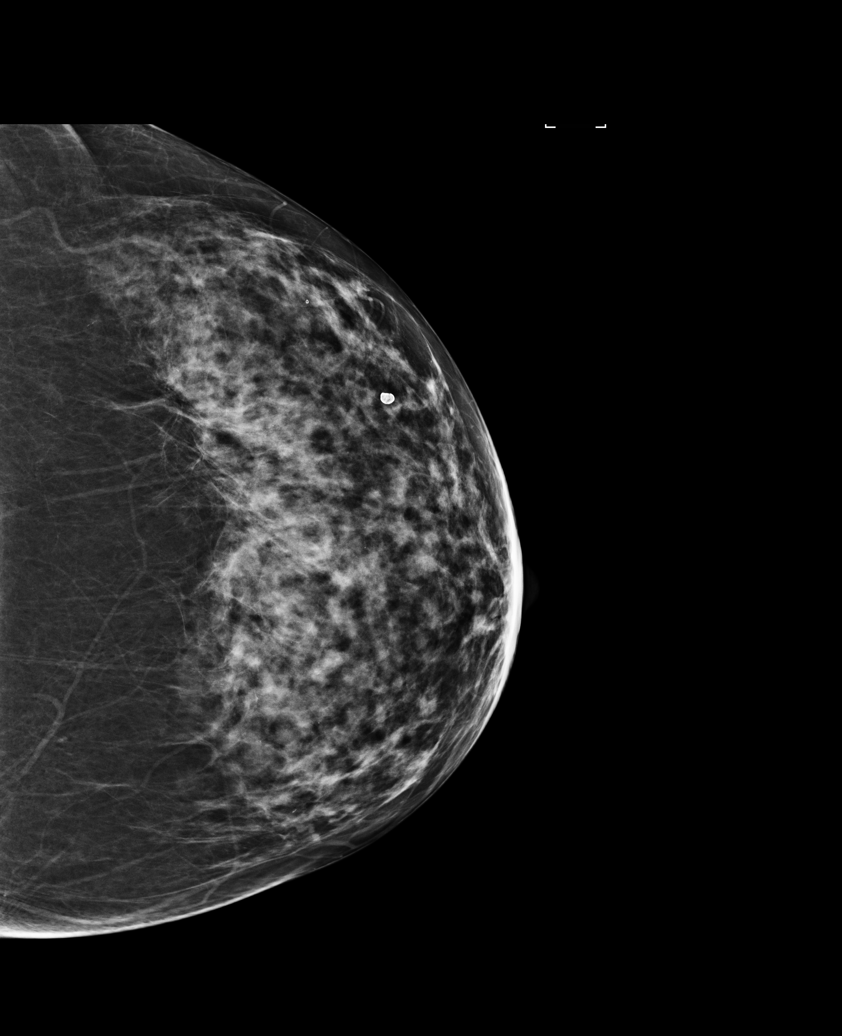

[R CC tomo · tomo slice 39/76.0]
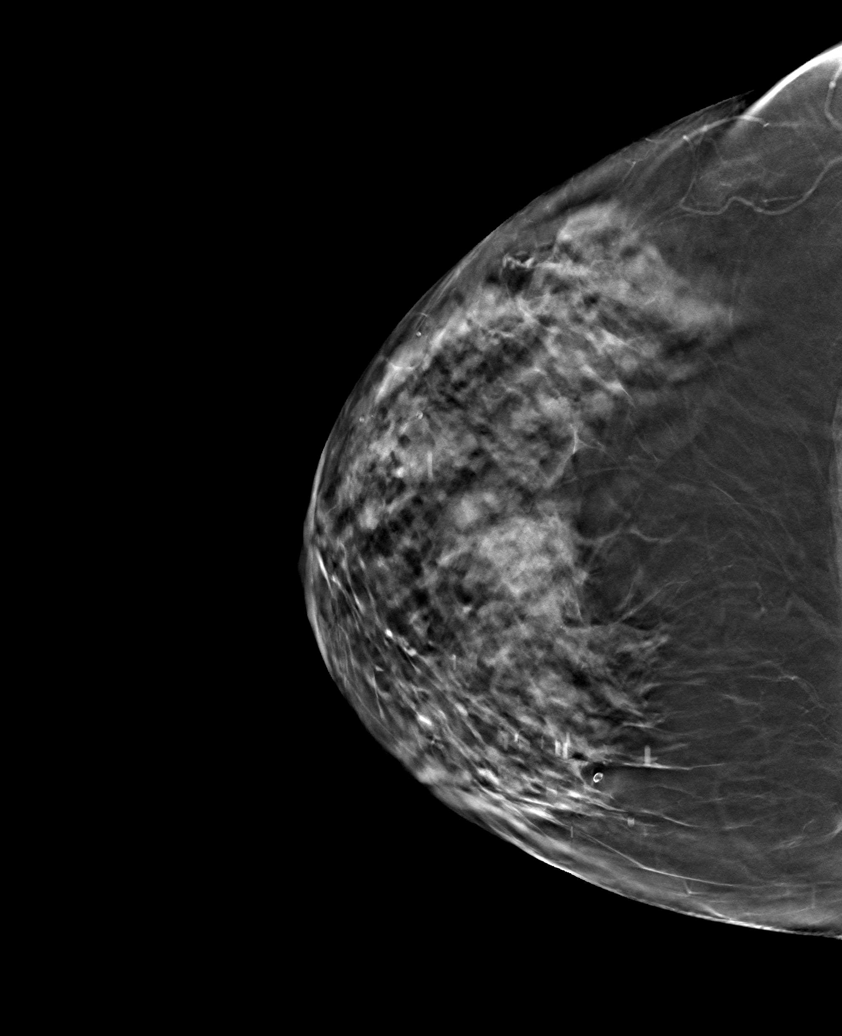

[6 of 30 positions shown; findings below may reference images not displayed]

ACR Breast Density Category c: The breast tissue is heterogeneously
dense, which may obscure small masses.
FINDINGS: There are no findings suspicious for malignancy. Images were
processed with CAD.
IMPRESSION: No mammographic evidence of malignancy. A result letter of this
screening mammogram will be mailed directly to the patient.

RECOMMENDATION:
Screening mammogram in one year. (Code:[SM])

BI-RADS CATEGORY  1: Negative.

## 2015-08-30 DIAGNOSIS — I10 Essential (primary) hypertension: Secondary | ICD-10-CM | POA: Insufficient documentation

## 2015-10-29 ENCOUNTER — Other Ambulatory Visit: Payer: Self-pay

## 2015-10-29 DIAGNOSIS — Z1231 Encounter for screening mammogram for malignant neoplasm of breast: Secondary | ICD-10-CM

## 2015-12-13 ENCOUNTER — Ambulatory Visit: Payer: BLUE CROSS/BLUE SHIELD

## 2016-05-22 DIAGNOSIS — F988 Other specified behavioral and emotional disorders with onset usually occurring in childhood and adolescence: Secondary | ICD-10-CM | POA: Insufficient documentation

## 2016-05-22 DIAGNOSIS — F410 Panic disorder [episodic paroxysmal anxiety] without agoraphobia: Secondary | ICD-10-CM | POA: Insufficient documentation

## 2016-05-22 DIAGNOSIS — F411 Generalized anxiety disorder: Secondary | ICD-10-CM | POA: Insufficient documentation

## 2016-05-22 DIAGNOSIS — I10 Essential (primary) hypertension: Secondary | ICD-10-CM | POA: Insufficient documentation

## 2016-06-24 ENCOUNTER — Telehealth: Payer: Self-pay | Admitting: Pain Medicine

## 2016-06-24 NOTE — Telephone Encounter (Signed)
patient needs to come in for evaluation since it has been 2 years since her last visit. Please schedule for eval. Thank you.

## 2016-06-24 NOTE — Telephone Encounter (Signed)
Patient last seen in 2015 by Dr. Dossie Arbour, having foot surgery Nov 28, would like to come in for injection for her back, would rather not come for eval, just wants to come in for procedure if possible.

## 2016-07-30 ENCOUNTER — Ambulatory Visit: Payer: BLUE CROSS/BLUE SHIELD | Admitting: Pain Medicine

## 2016-08-04 ENCOUNTER — Encounter: Payer: Self-pay | Admitting: Pain Medicine

## 2016-08-04 ENCOUNTER — Ambulatory Visit (HOSPITAL_BASED_OUTPATIENT_CLINIC_OR_DEPARTMENT_OTHER): Payer: BLUE CROSS/BLUE SHIELD | Admitting: Pain Medicine

## 2016-08-04 ENCOUNTER — Ambulatory Visit
Admission: RE | Admit: 2016-08-04 | Discharge: 2016-08-04 | Disposition: A | Discharge status: Home / Self Care | Payer: BLUE CROSS/BLUE SHIELD | Source: Ambulatory Visit | Attending: Pain Medicine | Admitting: Pain Medicine

## 2016-08-04 VITALS — BP 133/63 | HR 77 | Temp 97.3°F | Resp 14 | Ht 67.0 in | Wt 211.0 lb

## 2016-08-04 DIAGNOSIS — Z79891 Long term (current) use of opiate analgesic: Secondary | ICD-10-CM | POA: Insufficient documentation

## 2016-08-04 DIAGNOSIS — G8929 Other chronic pain: Secondary | ICD-10-CM | POA: Diagnosis not present

## 2016-08-04 DIAGNOSIS — M47816 Spondylosis without myelopathy or radiculopathy, lumbar region: Secondary | ICD-10-CM

## 2016-08-04 DIAGNOSIS — M545 Low back pain, unspecified: Secondary | ICD-10-CM | POA: Insufficient documentation

## 2016-08-04 DIAGNOSIS — F1190 Opioid use, unspecified, uncomplicated: Secondary | ICD-10-CM | POA: Insufficient documentation

## 2016-08-04 MED ORDER — TRIAMCINOLONE ACETONIDE 40 MG/ML IJ SUSP: 40.0000 mg | Freq: Once | INTRAMUSCULAR | Status: DC

## 2016-08-04 MED ORDER — IOPAMIDOL (ISOVUE-M 200) INJECTION 41%
10.0000 mL | Freq: Once | INTRAMUSCULAR | Status: AC
  Administered 2016-08-04: 10 mL via EPIDURAL

## 2016-08-04 MED ORDER — SODIUM CHLORIDE 0.9 % IJ SOLN
INTRAMUSCULAR | Status: AC
  Administered 2016-08-04: 09:00:00
  Filled 2016-08-04: qty 10

## 2016-08-04 MED ORDER — FENTANYL CITRATE (PF) 100 MCG/2ML IJ SOLN: 25.0000 ug | INTRAMUSCULAR | Status: DC | PRN

## 2016-08-04 MED ORDER — TRIAMCINOLONE ACETONIDE 40 MG/ML IJ SUSP
INTRAMUSCULAR | Status: AC
  Administered 2016-08-04: 09:00:00
  Filled 2016-08-04: qty 1

## 2016-08-04 MED ORDER — LIDOCAINE HCL (PF) 1 % IJ SOLN: 10.0000 mL | Freq: Once | INTRAMUSCULAR | Status: DC

## 2016-08-04 MED ORDER — LACTATED RINGERS IV SOLN
1000.0000 mL | Freq: Once | INTRAVENOUS | Status: AC
  Administered 2016-08-04: 1000 mL via INTRAVENOUS

## 2016-08-04 MED ORDER — ROPIVACAINE HCL 2 MG/ML IJ SOLN: 2.0000 mL | Freq: Once | INTRAMUSCULAR | Status: DC

## 2016-08-04 MED ORDER — ROPIVACAINE HCL 2 MG/ML IJ SOLN
INTRAMUSCULAR | Status: AC
  Administered 2016-08-04: 09:00:00
  Filled 2016-08-04: qty 10

## 2016-08-04 MED ORDER — SODIUM CHLORIDE 0.9% FLUSH: 2.0000 mL | Freq: Once | INTRAVENOUS | Status: DC

## 2016-08-04 MED ORDER — FENTANYL CITRATE (PF) 100 MCG/2ML IJ SOLN
INTRAMUSCULAR | Status: AC
  Administered 2016-08-04: 50 ug
  Filled 2016-08-04: qty 2

## 2016-08-04 MED ORDER — MIDAZOLAM HCL 5 MG/5ML IJ SOLN
INTRAMUSCULAR | Status: AC
  Administered 2016-08-04: 2 mg
  Filled 2016-08-04: qty 5

## 2016-08-04 MED ORDER — MIDAZOLAM HCL 5 MG/5ML IJ SOLN: 1.0000 mg | INTRAMUSCULAR | Status: DC | PRN

## 2016-08-04 MED ORDER — LIDOCAINE HCL (PF) 1 % IJ SOLN
INTRAMUSCULAR | Status: AC
  Administered 2016-08-04: 09:00:00
  Filled 2016-08-04: qty 5

## 2016-08-04 NOTE — Patient Instructions (Addendum)
Pain Management Discharge Instructions  General Discharge Instructions :  If you need to reach your doctor call: Monday-Friday 8:00 am - 4:00 pm at [phone] or toll free [phone].  After clinic hours [phone] to have operator reach doctor.  Bring all of your medication bottles to all your appointments in the pain clinic.  To cancel or reschedule your appointment with Pain Management please remember to call 24 hours in advance to avoid a fee.  Refer to the educational materials which you have been given on: General Risks, I had my Procedure. Discharge Instructions, Post Sedation.  Post Procedure Instructions:  The drugs you were given will stay in your system until tomorrow, so for the next 24 hours you should not drive, make any legal decisions or drink any alcoholic beverages.  You may eat anything you prefer, but it is better to start with liquids then soups and crackers, and gradually work up to solid foods.  Please notify your doctor immediately if you have any unusual bleeding, trouble breathing or pain that is not related to your normal pain.  Depending on the type of procedure that was done, some parts of your body may feel week and/or numb.  This usually clears up by tonight or the next day.  Walk with the use of an assistive device or accompanied by an adult for the 24 hours.  You may use ice on the affected area for the first 24 hours.  Put ice in a Ziploc bag and cover with a towel and place against area 15 minutes on 15 minutes off.  You may switch to heat after 24 hours.Epidural Steroid Injection An epidural steroid injection is given to relieve pain in your neck, back, or legs that is caused by the irritation or swelling of a nerve root. This procedure involves injecting a steroid and numbing medicine (anesthetic) into the epidural space. The epidural space is the space between the outer covering of your spinal cord and the bones that form your backbone  (vertebra).  LET YOUR HEALTH CARE PROVIDER KNOW ABOUT:   Any allergies you have.  All medicines you are taking, including vitamins, herbs, eye drops, creams, and over-the-counter medicines such as aspirin.  Previous problems you or members of your family have had with the use of anesthetics.  Any blood disorders or blood clotting disorders you have.  Previous surgeries you have had.  Medical conditions you have. RISKS AND COMPLICATIONS Generally, this is a safe procedure. However, as with any procedure, complications can occur. Possible complications of epidural steroid injection include:  Headache.  Bleeding.  Infection.  Allergic reaction to the medicines.  Damage to your nerves. The response to this procedure depends on the underlying cause of the pain and its duration. People who have long-term (chronic) pain are less likely to benefit from epidural steroids than are those people whose pain comes on strong and suddenly. BEFORE THE PROCEDURE   Ask your health care provider about changing or stopping your regular medicines. You may be advised to stop taking blood-thinning medicines a few days before the procedure.  You may be given medicines to reduce anxiety.  Arrange for someone to take you home after the procedure. PROCEDURE   You will remain awake during the procedure. You may receive medicine to make you relaxed.  You will be asked to lie on your stomach.  The injection site will be cleaned.  The injection site will be numbed with a medicine (local anesthetic).  A needle will be   injected through your skin into the epidural space.  Your health care provider will use an X-ray machine to ensure that the steroid is delivered closest to the affected nerve. You may have minimal discomfort at this time.  Once the needle is in the right position, the local anesthetic and the steroid will be injected into the epidural space.  The needle will then be removed and a  bandage will be applied to the injection site. AFTER THE PROCEDURE   You may be monitored for a short time before you go home.  You may feel weakness or numbness in your arm or leg, which disappears within hours.  You may be allowed to eat, drink, and take your regular medicine.  You may have soreness at the site of the injection.   This information is not intended to replace advice given to you by your health care provider. Make sure you discuss any questions you have with your health care provider.   Document Released: 12/23/2007 Document Revised: 05/18/2013 Document Reviewed: 03/04/2013 [employer] Interactive Patient Education 2016 [employer]  

## 2016-08-04 NOTE — Progress Notes (Signed)
Safety precautions to be maintained throughout the outpatient stay will include: orient to surroundings, keep bed in low position, maintain call bell within reach at all times, provide assistance with transfer out of bed and ambulation.  

## 2016-08-05 ENCOUNTER — Telehealth: Payer: Self-pay | Admitting: *Deleted

## 2016-08-06 NOTE — Progress Notes (Signed)
Patient's Name: Rhonda Espinoza  MRN: [id number]  Referring Provider: Chesley Noon, MD  DOB: [birth year]  PCP: Chesley Noon, MD  DOS: 08/04/2016  Note by: Kathlen Brunswick. Dossie Arbour, MD  Service setting: Ambulatory outpatient  Specialty: Interventional Pain Management  Location: [facility] (AMB) Pain Management Facility    Patient type: New patient ("FAST-TRACK" Evaluation)   Warning: This referral option does not include the extensive pharmacological evaluation required for [place] to take over the patient's medication management. The "Fast-Track" system is designed to bypass the new patient referral waiting list, as well as the normal patient evaluation process, in order to provide a patient in distress with a timely pain management intervention. Because the system was not designed to unfairly get a patient into our pain practice ahead of those already waiting, certain restrictions apply. By requesting a "Fast-Track" consult, the referring physician has opted to continue managing the patient's medications in order to get interventional urgent care.  Primary Reason for Visit: Interventional Pain Management Treatment. CC: Back Pain (lower right)  Procedure:  Anesthesia, Analgesia, Anxiolysis:  Type: Therapeutic Inter-Laminar Epidural Steroid Injection Region: Lumbar Level: L4-5 Level. Laterality: Right-Sided Paramedial  Type: Local Anesthesia with Moderate (Conscious) Sedation Local Anesthetic: Lidocaine 1% Route: Intravenous (IV) IV Access: Secured Sedation: Meaningful verbal contact was maintained at all times during the procedure  Indication(s): Analgesia and Anxiety  Indications: 1. Chronic right-sided low back pain without sciatica   2. Spondylosis without myelopathy or radiculopathy, lumbar region    Pain Score: Pre-procedure: 3 /10 Post-procedure: 0-No pain/10  Pre-Procedure Assessment:  Rhonda Espinoza is a 55 y.o. (year old), female patient, seen today for interventional treatment. She  has no  past surgical history on file.. Her primarily concern today is the Back Pain (lower right) The primary encounter diagnosis was Chronic right-sided low back pain without sciatica. A diagnosis of Spondylosis without myelopathy or radiculopathy, lumbar region was also pertinent to this visit.  Pain Type: Chronic pain Pain Location: Back Pain Orientation: Lower, Right Pain Descriptors / Indicators: Tightness Pain Frequency: Intermittent  Date of Last Visit:  (patient at [facility])   Coagulation Parameters No results found for: INR, LABPROT, APTT, PLT Verification of the correct person, correct site (including marking of site), and correct procedure were performed and confirmed by the patient.  Consent: Before the procedure and under the influence of no sedative(s), amnesic(s), or anxiolytics, the patient was informed of the treatment options, risks and possible complications. To fulfill our ethical and legal obligations, as recommended by the American Medical Association's Code of Ethics, I have informed the patient of my clinical impression; the nature and purpose of the treatment or procedure; the risks, benefits, and possible complications of the intervention; the alternatives, including doing nothing; the risk(s) and benefit(s) of the alternative treatment(s) or procedure(s); and the risk(s) and benefit(s) of doing nothing. The patient was provided information about the general risks and possible complications associated with the procedure. These may include, but are not limited to: failure to achieve desired goals, infection, bleeding, organ or nerve damage, allergic reactions, paralysis, and death. In addition, the patient was informed of those risks and complications associated to Spine-related procedures, such as failure to decrease pain; infection (i.e.: Meningitis, epidural or intraspinal abscess); bleeding (i.e.: epidural hematoma, subarachnoid hemorrhage, or any other type of intraspinal or  peri-dural bleeding); organ or nerve damage (i.e.: Any type of peripheral nerve, nerve root, or spinal cord injury) with subsequent damage to sensory, motor, and/or autonomic systems, resulting in permanent pain,  numbness, and/or weakness of one or several areas of the body; allergic reactions; (i.e.: anaphylactic reaction); and/or death. Furthermore, the patient was informed of those risks and complications associated with the medications. These include, but are not limited to: allergic reactions (i.e.: anaphylactic or anaphylactoid reaction(s)); adrenal axis suppression; blood sugar elevation that in diabetics may result in ketoacidosis or comma; water retention that in patients with history of congestive heart failure may result in shortness of breath, pulmonary edema, and decompensation with resultant heart failure; weight gain; swelling or edema; medication-induced neural toxicity; particulate matter embolism and blood vessel occlusion with resultant organ, and/or nervous system infarction; and/or aseptic necrosis of one or more joints. Finally, the patient was informed that Medicine is not an exact science; therefore, there is also the possibility of unforeseen or unpredictable risks and/or possible complications that may result in a catastrophic outcome. The patient indicated having understood very clearly. We have given the patient no guarantees and we have made no promises. Enough time was given to the patient to ask questions, all of which were answered to the patient's satisfaction. Rhonda Espinoza has indicated that she wanted to continue with the procedure.  Consent Attestation: I, the ordering provider, attest that I have discussed with the patient the benefits, risks, side-effects, alternatives, likelihood of achieving goals, and potential problems during recovery for the procedure that I have provided informed consent.  Pre-Procedure Preparation:  Safety Precautions: Allergies reviewed. The patient  was asked about blood thinners, or active infections, both of which were denied. The patient was asked to confirm the procedure and laterality, before marking the site, and again before commencing the procedure. Appropriate site, procedure, and patient were confirmed by following the Joint Commission's Universal Protocol (UP.01.01.01), in the form of a "Time Out". The patient was asked to participate by confirming the accuracy of the "Time Out" information. Patient was assessed for positional comfort and pressure points before starting the procedure. Allergies: She has No Known Allergies. Allergy Precautions: None required Infection Control Precautions: Sterile technique used. Standard Universal Precautions were taken as recommended by the Department of [facility] for Disease Control and Prevention (CDC). Standard pre-surgical skin prep was conducted. Respiratory hygiene and cough etiquette was practiced. Hand hygiene observed. Safe injection practices and needle disposal techniques followed. SDV (single dose vial) medications used. Medications properly checked for expiration dates and contaminants. Personal protective equipment (PPE) used as per protocol. Monitoring:  As per clinic protocol. Vitals:   08/04/16 0900 08/04/16 0910 08/04/16 0920 08/04/16 0930  BP: (!) 149/75 (!) 161/84 (!) 147/67 133/63  Pulse: 78 83 77 77  Resp: '13 14 14 14  '$ Temp:  97.3 F (36.3 C)    TempSrc:      SpO2: 94% 98% 98% 96%  Weight:      Height:      Calculated BMI: Body mass index is 33.05 kg/m. Time-out: "Time-out" completed before starting procedure, as per protocol.  HPI  Ms. Laffey is a 55 y.o. year old, female patient, who comes today for a  "Fast-Track" new patient evaluation, as requested by Chesley Noon, MD. The patient has been made aware that this type of referral option is reserved for the Interventional Pain Management portion of our practice and completely excludes the option of  medication management. Her primarily concern today is the Back Pain (lower right)  Pain Assessment: Self-Reported Pain Score: 3 /10             Reported level is compatible with  observation.       Pain Type: Chronic pain Pain Location: Back Pain Orientation: Lower, Right Pain Descriptors / Indicators: Tightness Pain Frequency: Intermittent  Onset and Duration: Gradual and Present longer than 3 months Cause of pain: Arthritis Severity: Getting worse and NAS-11 at its best: 2/10 Timing: Morning and After a period of immobility Aggravating Factors: Prolonged sitting, Prolonged standing and Walking uphill Alleviating Factors: Standing and TENS Associated Problems: Pain that wakes patient up Quality of Pain: Aching Previous Examinations or Tests: MRI scan Previous Treatments: Epidural steroid injections and Narcotic medications  Historic Controlled Substance Pharmacotherapy Review  PMP and historical list of controlled substances: Tramadol 50 mg, hydrocodone/APAP 5/325 Most recent analgesic: Tramadol 50 mg 2 tablets by mouth 4 times a day MME/day: 40 mg/day Medications: The patient did not bring the medication(s) to the appointment, as requested in our "New Patient Package" Pharmacodynamics: Desired effects: Analgesia: The patient reports >50% benefit. Reported improvement in function: The patient reports medication allows her to accomplish basic ADLs. Clinically meaningful improvement in function (CMIF): Sustained CMIF goals met Perceived effectiveness: Described as relatively effective, allowing for increase in activities of daily living (ADL) Undesirable effects: Side-effects or Adverse reactions: None reported Historical Monitoring: The patient  has no drug history on file.. No results found for: MDMA, COCAINSCRNUR, PCPSCRNUR, THCU, ETH Historical Background Evaluation: [place] PDMP: Six (6) year initial data search conducted.             [place] Department of public safety, offender  search: Editor, commissioning Information) Non-contributory Risk Assessment Profile: Aberrant behavior: None observed or detected today Risk factors for fatal opioid overdose: None identified today Fatal overdose hazard ratio (HR): Calculation deferred Non-fatal overdose hazard ratio (HR): Calculation deferred Risk of opioid abuse or dependence: 0.7-3.0% with doses ? 36 MME/day and 6.1-26% with doses ? 120 MME/day. Substance use disorder (SUD) risk level: Pending results of Medical Psychology Evaluation for SUD Opioid risk tool (ORT) (Total Score): 3  ORT Scoring interpretation table:  Score <3 = Low Risk for SUD  Score between 4-7 = Moderate Risk for SUD  Score >8 = High Risk for Opioid Abuse   PHQ-2 Depression Scale:  Total score: 0  PHQ-2 Scoring interpretation table: (Score and probability of major depressive disorder)  Score 0 = No depression  Score 1 = 15.4% Probability  Score 2 = 21.1% Probability  Score 3 = 38.4% Probability  Score 4 = 45.5% Probability  Score 5 = 56.4% Probability  Score 6 = 78.6% Probability   PHQ-9 Depression Scale:  Total score: 0  PHQ-9 Scoring interpretation table:  Score 0-4 = No depression  Score 5-9 = Mild depression  Score 10-14 = Moderate depression  Score 15-19 = Moderately severe depression  Score 20-27 = Severe depression (2.4 times higher risk of SUD and 2.89 times higher risk of overuse)   Pharmacologic Plan: Since this is a "fast-track" referral, we will not be taking over the patient's medications.  Meds  The patient has a current medication list which includes the following prescription(s): alprazolam, aspirin, atorvastatin, citalopram, fenofibrate, lisinopril-hydrochlorothiazide, vitamin d (ergocalciferol), and zolpidem, and the following Facility-Administered Medications: fentanyl, lidocaine (pf), midazolam, ropivacaine (pf) 2 mg/ml (0.2%), sodium chloride flush, and triamcinolone acetonide.  No current outpatient prescriptions on file prior to  visit.   No current facility-administered medications on file prior to visit.    Imaging Review   Note: No results found under the [facility] electronic medical record  ROS  Cardiovascular History: Negative for hypertension,  coronary artery diseas, myocardial infraction, anticoagulant therapy or heart failure Pulmonary or Respiratory History: Negative for bronchial asthma, emphysema, chronic smoking, chronic bronchitis, sarcoidosis, tuberculosis or sleep apena Neurological History: Negative for epilepsy, stroke, urinary or fecal inontinence, spina bifida or tethered cord syndrome Review of Past Neurological Studies: No results found for this or any previous visit. Psychological-Psychiatric History: Negative for anxiety, depression, schizophrenia, bipolar disorders or suicidal ideations or attempts Gastrointestinal History: Negative for peptic ulcer disease, hiatal hernia, GERD, IBS, hepatitis, cirrhosis or pancreatitis Genitourinary History: Negative for nephrolithiasis, hematuria, renal failure or chronic kidney disease Hematological History: Negative for anticoagulant therapy, anemia, bruising or bleeding easily, hemophilia, sickle cell disease or trait, thrombocytopenia or coagulupathies Endocrine History: Negative for diabetes or thyroid disease Rheumatologic History: Osteoarthritis Musculoskeletal History: Negative for myasthenia gravis, muscular dystrophy, multiple sclerosis or malignant hyperthermia Work History: Working full time  Allergies  Ms. Baswell has No Known Allergies.  Laboratory Chemistry  Inflammation Markers No results found for: ESRSEDRATE, CRP Renal Function No results found for: BUN, CREATININE, GFRAA, GFRNONAA Hepatic Function No results found for: AST, ALT, ALBUMIN Electrolytes No results found for: NA, K, CL, CALCIUM, MG Pain Modulating Vitamins No results found for: [place], [id number], [id number], [id number], 25OHVITD1, 25OHVITD2, 25OHVITD3,  VITAMINB12 Coagulation Parameters No results found for: INR, LABPROT, APTT, PLT Cardiovascular No results found for: BNP, HGB, HCT Note: No results found under the [employer] electronic medical record  [facility]  Drug: Ms. Tayloe  has no drug history on file. Alcohol:  reports that she does not drink alcohol. Tobacco:  reports that she has never smoked. She has never used smokeless tobacco. Medical:  has a past medical history of Disc disorder. Family: family history is not on file.  History reviewed. No pertinent surgical history. Active Ambulatory Problems    Diagnosis Date Noted  . Back muscle spasm 01/31/2014  . Abnormal LFTs 01/31/2014  . Blood glucose elevated 01/31/2014  . HLD (hyperlipidemia) 06/23/2011  . Hypertriglyceridemia 06/23/2011  . Cannot sleep 01/31/2014  . Low back pain 01/31/2014  . Avitaminosis D 07/03/2011  . Chronic bilateral low back pain without sciatica 08/04/2016  . Long term current use of opiate analgesic 08/04/2016  . Long term prescription opiate use 08/04/2016  . Opiate use 08/04/2016  . ADD (attention deficit disorder) 05/22/2016  . Essential hypertension 08/30/2015  . GAD (generalized anxiety disorder) 05/22/2016  . Hypertension, essential 05/22/2016  . Panic attacks 05/22/2016  . Spondylosis without myelopathy or radiculopathy, lumbar region 08/04/2016   Resolved Ambulatory Problems    Diagnosis Date Noted  . No Resolved Ambulatory Problems   Past Medical History:  Diagnosis Date  . Disc disorder    Constitutional Exam  General appearance: Well nourished, well developed, and well hydrated. In no apparent acute distress Vitals:   08/04/16 0900 08/04/16 0910 08/04/16 0920 08/04/16 0930  BP: (!) 149/75 (!) 161/84 (!) 147/67 133/63  Pulse: 78 83 77 77  Resp: '13 14 14 14  '$ Temp:  97.3 F (36.3 C)    TempSrc:      SpO2: 94% 98% 98% 96%  Weight:      Height:       BMI Assessment: Estimated body mass index is 33.05 kg/m as calculated  from the following:   Height as of this encounter: '5\' 7"'$  (1.702 m).   Weight as of this encounter: 211 lb (95.7 kg).  BMI interpretation table: BMI level Category Range association with higher incidence of chronic pain  <18 kg/m2 Underweight  18.5-24.9 kg/m2 Ideal body weight   25-29.9 kg/m2 Overweight Increased incidence by 20%  30-34.9 kg/m2 Obese (Class I) Increased incidence by 68%  35-39.9 kg/m2 Severe obesity (Class II) Increased incidence by 136%  >40 kg/m2 Extreme obesity (Class III) Increased incidence by 254%   BMI Readings from Last 4 Encounters:  08/04/16 33.05 kg/m   Wt Readings from Last 4 Encounters:  08/04/16 211 lb (95.7 kg)  Psych/Mental status: Alert, oriented x 3 (person, place, & time) Eyes: PERLA Respiratory: No evidence of acute respiratory distress  Lumbar Spine Exam  Inspection: No masses, redness, or swelling Alignment: Symmetrical Functional ROM: Decreased ROM Stability: No instability detected Muscle strength & Tone: Functionally intact Sensory: Movement-associated pain Palpation: Complains of area being tender to palpation Provocative Tests: Lumbar Hyperextension and rotation test: Positive bilaterally for facet joint pain. Patrick's Maneuver: evaluation deferred today              Gait & Posture Assessment  Ambulation: Unassisted Gait: Relatively normal for age and body habitus Posture: WNL   Lower Extremity Exam    Side: Right lower extremity  Side: Left lower extremity  Inspection: No masses, redness, swelling, or asymmetry  Inspection: No masses, redness, swelling, or asymmetry  Functional ROM: Unrestricted ROM          Functional ROM: Unrestricted ROM          Muscle strength & Tone: Functionally intact  Muscle strength & Tone: Functionally intact  Sensory: Unimpaired  Sensory: Unimpaired  Palpation: Non-contributory  Palpation: Non-contributory   Description of Procedure Process:   Position: Prone with head of the table was raised  to facilitate breathing. Target Area: For Epidural Steroid injections the target is the interlaminar space, initially targeting the lower border of the superior vertebral body lamina. Approach: Paramedial approach. Area Prepped: Entire Posterior Lumbar Region Prepping solution: ChloraPrep (2% chlorhexidine gluconate and 70% isopropyl alcohol) Safety Precautions: Aspiration looking for blood return was conducted prior to all injections. At no point did we inject any substances, as a needle was being advanced. No attempts were made at seeking any paresthesias. Safe injection practices and needle disposal techniques used. Medications properly checked for expiration dates. SDV (single dose vial) medications used. Description of the Procedure: Protocol guidelines were followed. The procedure needle was introduced through the skin, ipsilateral to the reported pain, and advanced to the target area. Bone was contacted and the needle walked caudad, until the lamina was cleared. The epidural space was identified using "loss-of-resistance technique" with 2-3 ml of PF-NaCl (0.9% NSS), in a 5cc LOR glass syringe. EBL: None Materials & Medications:  Needle(s) Type: Tuohy-style epidural needle Gauge: 17G Length: 1.5-in Medication(s): We administered ropivacaine (PF) 2 mg/ml (0.2%), lidocaine (PF), sodium chloride, triamcinolone acetonide, fentaNYL, midazolam, lactated ringers, and iopamidol. Please see chart orders for dosing details.  Imaging Guidance (Spinal):  Type of Imaging Technique: Fluoroscopy Guidance (Spinal) Indication(s): Assistance in needle guidance and placement for procedures requiring needle placement in or near specific anatomical locations not easily accessible without such assistance. Exposure Time: Please see nurses notes. Contrast: Before injecting any contrast, we confirmed that the patient did not have an allergy to iodine, shellfish, or radiological contrast. Once satisfactory needle  placement was completed at the desired level, radiological contrast was injected. Contrast injected under live fluoroscopy. No contrast complications. See chart for type and volume of contrast used. Fluoroscopic Guidance: I was personally present during the use of fluoroscopy. "Tunnel Vision Technique" used to obtain the best possible view  of the target area. Parallax error corrected before commencing the procedure. "Direction-depth-direction" technique used to introduce the needle under continuous pulsed fluoroscopy. Once target was reached, antero-posterior, oblique, and lateral fluoroscopic projection used confirm needle placement in all planes. Images permanently stored in EMR. Interpretation: I personally interpreted the imaging intraoperatively. Adequate needle placement confirmed in multiple planes. Appropriate spread of contrast into desired area was observed. No evidence of afferent or efferent intravascular uptake. No intrathecal or subarachnoid spread observed. Permanent images saved into the patient's record.  Antibiotic Prophylaxis:  Indication(s): No indications identified. Type:  Antibiotics Given (last 72 hours)    None      Post-operative Assessment:  Complications: No immediate post-treatment complications observed by team, or reported by patient. Disposition: The patient tolerated the entire procedure well. A repeat set of vitals were taken after the procedure and the patient was kept under observation following institutional policy, for this type of procedure. Post-procedural neurological assessment was performed, showing return to baseline, prior to discharge. The patient was provided with post-procedure discharge instructions, including a section on how to identify potential problems. Should any problems arise concerning this procedure, the patient was given instructions to immediately contact us, at any time, without hesitation. In any case, we plan to contact the patient by  telephone for a follow-up status report regarding this interventional procedure. Comments:  No additional relevant information.  Plan of Care  Discharge to: Discharge home  Medications ordered for procedure: Meds ordered this encounter  Medications  . ropivacaine (PF) 2 mg/ml (0.2%) (NAROPIN) 2 MG/ML epidural    TICE, KORI: cabinet override  . lidocaine (PF) (XYLOCAINE) 1 % injection    TICE, KORI: cabinet override  . sodium chloride 0.9 % injection    TICE, KORI: cabinet override  . triamcinolone acetonide (KENALOG-40) 40 MG/ML injection    TICE, KORI: cabinet override  . fentaNYL (SUBLIMAZE) 100 MCG/2ML injection    TICE, KORI: cabinet override  . midazolam (VERSED) 5 MG/5ML injection    TICE, KORI: cabinet override  . fentaNYL (SUBLIMAZE) injection 25-50 mcg    Make sure Narcan is available in the pyxis when using this medication. In the event of respiratory depression (RR< 8/min): Titrate NARCAN (naloxone) in increments of 0.1 to 0.2 mg IV at 2-3 minute intervals, until desired degree of reversal.  . lactated ringers infusion 1,000 mL  . midazolam (VERSED) 5 MG/5ML injection 1-2 mg    Make sure Flumazenil is available in the pyxis when using this medication. If oversedation occurs, administer 0.2 mg IV over 15 sec. If after 45 sec no response, administer 0.2 mg again over 1 min; may repeat at 1 min intervals; not to exceed 4 doses (1 mg)  . iopamidol (ISOVUE-M) 41 % intrathecal injection 10 mL  . triamcinolone acetonide (KENALOG-40) injection 40 mg  . lidocaine (PF) (XYLOCAINE) 1 % injection 10 mL  . sodium chloride flush (NS) 0.9 % injection 2 mL  . ropivacaine (PF) 2 mg/ml (0.2%) (NAROPIN) epidural 2 mL   Medications administered: (For more details, see medical record) We administered ropivacaine (PF) 2 mg/ml (0.2%), lidocaine (PF), sodium chloride, triamcinolone acetonide, fentaNYL, midazolam, lactated ringers, and iopamidol. Lab-work, Procedure(s), & Referral(s)  Ordered: Orders Placed This Encounter  Procedures  . Lumbar Epidural Injection  . DG C-Arm 1-60 Min-No Report   Imaging Ordered: No results found for this or any previous visit. New Prescriptions   No medications on file   Physician-requested Follow-up:  Return in about 2 weeks (around 08/18/2016)  for Post-Procedure evaluation.  Future Appointments Date Time Provider [place]  09/08/2016 1:15 PM Milinda Pointer, MD [facility] None   Primary Care Physician: Chesley Noon, MD Location: [facility] Facility Note by: Kathlen Brunswick. Dossie Arbour, M.D, DABA, DABAPM, DABPM, DABIPP, FIPP  Disclaimer:  Medicine is not an exact science. The only guarantee in medicine is that nothing is guaranteed. It is important to note that the decision to proceed with this intervention was based on the information collected from the patient. The Data and conclusions were drawn from the patient's questionnaire, the interview, and the physical examination. Because the information was provided in large part by the patient, it cannot be guaranteed that it has not been purposely or unconsciously manipulated. Every effort has been made to obtain as much relevant data as possible for this evaluation. It is important to note that the conclusions that lead to this procedure are derived in large part from the available data. Always take into account that the treatment will also be dependent on availability of resources and existing treatment guidelines, considered by other Pain Management Practitioners as being common knowledge and practice, at the time of the intervention. For Medico-Legal purposes, it is also important to point out that variation in procedural techniques and pharmacological choices are the acceptable norm. The indications, contraindications, technique, and results of the above procedure should only be interpreted and judged by a Board-Certified Interventional Pain Specialist with  extensive familiarity and expertise in the same exact procedure and technique. Attempts at providing opinions without similar or greater experience and expertise than that of the treating physician will be considered as inappropriate and unethical, and shall result in a formal complaint to the state medical board and applicable specialty societies.  Instructions provided at this appointment: Patient Instructions  Pain Management Discharge Instructions  General Discharge Instructions :  If you need to reach your doctor call: Monday-Friday 8:00 am - 4:00 pm at [phone] or toll free [phone].  After clinic hours [phone] to have operator reach doctor.  Bring all of your medication bottles to all your appointments in the pain clinic.  To cancel or reschedule your appointment with Pain Management please remember to call 24 hours in advance to avoid a fee.  Refer to the educational materials which you have been given on: General Risks, I had my Procedure. Discharge Instructions, Post Sedation.  Post Procedure Instructions:  The drugs you were given will stay in your system until tomorrow, so for the next 24 hours you should not drive, make any legal decisions or drink any alcoholic beverages.  You may eat anything you prefer, but it is better to start with liquids then soups and crackers, and gradually work up to solid foods.  Please notify your doctor immediately if you have any unusual bleeding, trouble breathing or pain that is not related to your normal pain.  Depending on the type of procedure that was done, some parts of your body may feel week and/or numb.  This usually clears up by tonight or the next day.  Walk with the use of an assistive device or accompanied by an adult for the 24 hours.  You may use ice on the affected area for the first 24 hours.  Put ice in a Ziploc bag and cover with a towel and place against area 15 minutes on 15 minutes off.  You may switch to  heat after 24 hours.Epidural Steroid Injection An epidural steroid injection is given to relieve pain in your  neck, back, or legs that is caused by the irritation or swelling of a nerve root. This procedure involves injecting a steroid and numbing medicine (anesthetic) into the epidural space. The epidural space is the space between the outer covering of your spinal cord and the bones that form your backbone (vertebra).  LET [facility] CARE PROVIDER KNOW ABOUT:   Any allergies you have.  All medicines you are taking, including vitamins, herbs, eye drops, creams, and over-the-counter medicines such as aspirin.  Previous problems you or members of your family have had with the use of anesthetics.  Any blood disorders or blood clotting disorders you have.  Previous surgeries you have had.  Medical conditions you have. RISKS AND COMPLICATIONS Generally, this is a safe procedure. However, as with any procedure, complications can occur. Possible complications of epidural steroid injection include:  Headache.  Bleeding.  Infection.  Allergic reaction to the medicines.  Damage to your nerves. The response to this procedure depends on the underlying cause of the pain and its duration. People who have long-term (chronic) pain are less likely to benefit from epidural steroids than are those people whose pain comes on strong and suddenly. BEFORE THE PROCEDURE   Ask your health care provider about changing or stopping your regular medicines. You may be advised to stop taking blood-thinning medicines a few days before the procedure.  You may be given medicines to reduce anxiety.  Arrange for someone to take you home after the procedure. PROCEDURE   You will remain awake during the procedure. You may receive medicine to make you relaxed.  You will be asked to lie on your stomach.  The injection site will be cleaned.  The injection site will be numbed with a medicine (local  anesthetic).  A needle will be injected through your skin into the epidural space.  Your health care provider will use an X-ray machine to ensure that the steroid is delivered closest to the affected nerve. You may have minimal discomfort at this time.  Once the needle is in the right position, the local anesthetic and the steroid will be injected into the epidural space.  The needle will then be removed and a bandage will be applied to the injection site. AFTER THE PROCEDURE   You may be monitored for a short time before you go home.  You may feel weakness or numbness in your arm or leg, which disappears within hours.  You may be allowed to eat, drink, and take your regular medicine.  You may have soreness at the site of the injection.   This information is not intended to replace advice given to you by your health care provider. Make sure you discuss any questions you have with your health care provider.   Document Released: 12/23/2007 Document Revised: 05/18/2013 Document Reviewed: 03/04/2013 Elsevier Interactive Patient Education [employer].

## 2016-09-08 ENCOUNTER — Ambulatory Visit: Payer: BLUE CROSS/BLUE SHIELD | Admitting: Pain Medicine

## 2016-12-12 ENCOUNTER — Other Ambulatory Visit: Payer: Self-pay

## 2017-02-03 DIAGNOSIS — J301 Allergic rhinitis due to pollen: Secondary | ICD-10-CM | POA: Insufficient documentation

## 2017-03-25 ENCOUNTER — Other Ambulatory Visit: Payer: Self-pay | Admitting: Dermatology

## 2018-05-24 DIAGNOSIS — E669 Obesity, unspecified: Secondary | ICD-10-CM | POA: Insufficient documentation

## 2018-05-24 DIAGNOSIS — E66811 Obesity, class 1: Secondary | ICD-10-CM | POA: Insufficient documentation

## 2018-05-24 DIAGNOSIS — Z6833 Body mass index (BMI) 33.0-33.9, adult: Secondary | ICD-10-CM

## 2018-07-27 ENCOUNTER — Other Ambulatory Visit: Payer: Self-pay | Admitting: Pain Medicine

## 2018-07-27 ENCOUNTER — Telehealth: Payer: Self-pay | Admitting: Pain Medicine

## 2018-07-27 DIAGNOSIS — M545 Low back pain: Principal | ICD-10-CM

## 2018-07-27 DIAGNOSIS — M47816 Spondylosis without myelopathy or radiculopathy, lumbar region: Secondary | ICD-10-CM

## 2018-07-27 DIAGNOSIS — G8929 Other chronic pain: Secondary | ICD-10-CM

## 2018-07-27 NOTE — Telephone Encounter (Signed)
Dr Dossie Arbour has put in an order for a Bilateral lumbar facet with sedation, and an xray.  I have notified the patient of pre procedure instructions and that she has an xray ordered.  Please get approval and call patient with an appointment.  Thank you.

## 2018-07-27 NOTE — Telephone Encounter (Signed)
Pain in center of the lower back, some in right buttocks

## 2018-07-27 NOTE — Telephone Encounter (Signed)
Patient called stating she spoke with Dr. Dossie Arbour and he wants her scheduled for a procedure. Her last visit date was in Nov 2017. She has new insurance w/ [employer] and we need to know what procedure to get approved. Please ask Dr. Dossie Arbour this info and send to [facility] for prior auth.

## 2018-07-27 NOTE — Telephone Encounter (Signed)
I was able to get prior auth for the facets, called the patient and scheduled for 08/10/18.

## 2018-08-02 ENCOUNTER — Ambulatory Visit (HOSPITAL_BASED_OUTPATIENT_CLINIC_OR_DEPARTMENT_OTHER)
Admission: RE | Admit: 2018-08-02 | Discharge: 2018-08-02 | Disposition: A | Discharge status: Home / Self Care | Payer: Managed Care, Other (non HMO) | Source: Ambulatory Visit | Attending: Pain Medicine | Admitting: Pain Medicine

## 2018-08-02 DIAGNOSIS — M47816 Spondylosis without myelopathy or radiculopathy, lumbar region: Secondary | ICD-10-CM | POA: Diagnosis not present

## 2018-08-02 DIAGNOSIS — X58XXXA Exposure to other specified factors, initial encounter: Secondary | ICD-10-CM | POA: Insufficient documentation

## 2018-08-02 DIAGNOSIS — S22089A Unspecified fracture of T11-T12 vertebra, initial encounter for closed fracture: Secondary | ICD-10-CM | POA: Insufficient documentation

## 2018-08-02 DIAGNOSIS — G8929 Other chronic pain: Secondary | ICD-10-CM | POA: Insufficient documentation

## 2018-08-02 DIAGNOSIS — M545 Low back pain, unspecified: Secondary | ICD-10-CM

## 2018-08-02 DIAGNOSIS — M419 Scoliosis, unspecified: Secondary | ICD-10-CM | POA: Diagnosis not present

## 2018-08-02 IMAGING — DX DG LUMBAR SPINE COMPLETE W/ BEND
7 series · 7 of 7 positions shown · non-contrast
Comparison: [DATE] CT.

CLINICAL DATA: 57-year-old female with pain approximately L1 area
for years. No known injury. Initial encounter.

EXAM:
LUMBAR SPINE - COMPLETE WITH BENDING VIEWS

[l-spine ap]
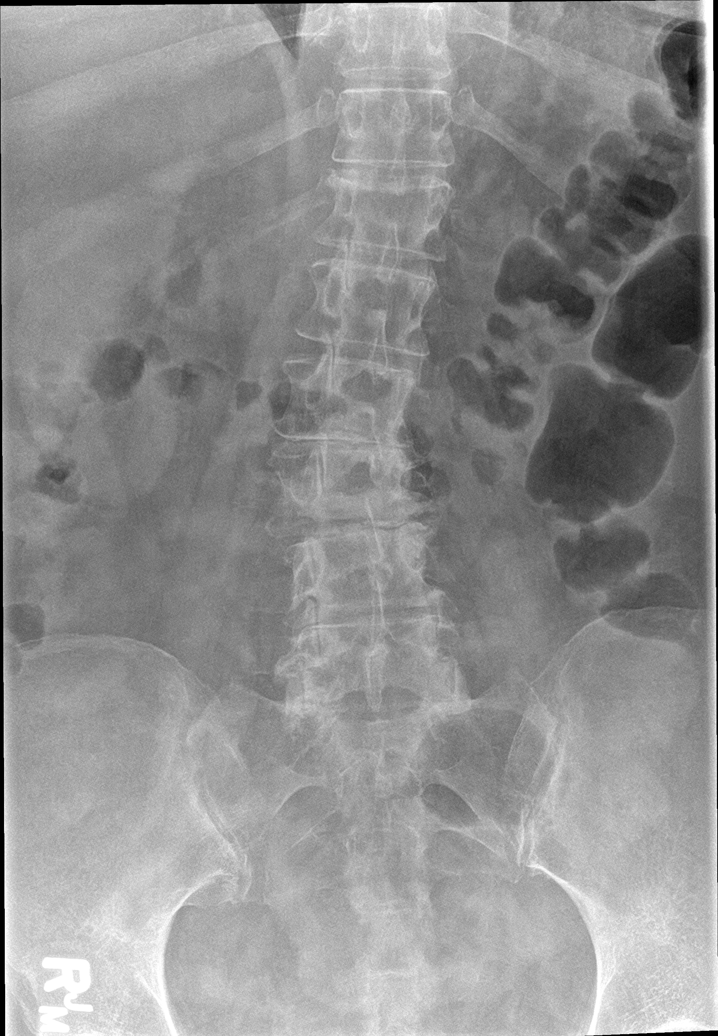

[l-spine obl (1 of 2)]
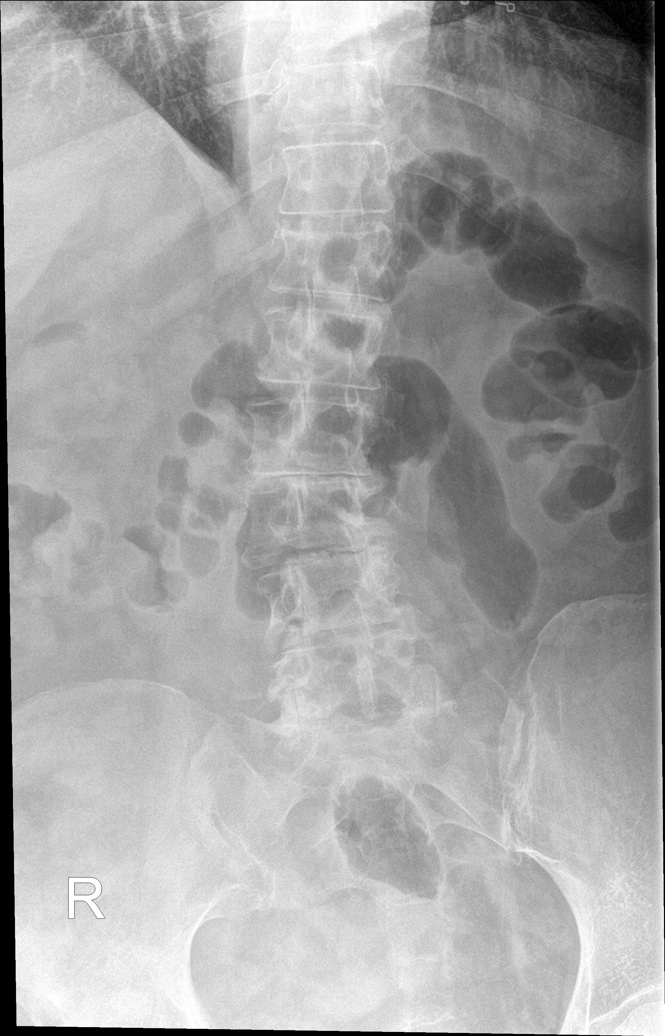

[l-spine obl (2 of 2)]
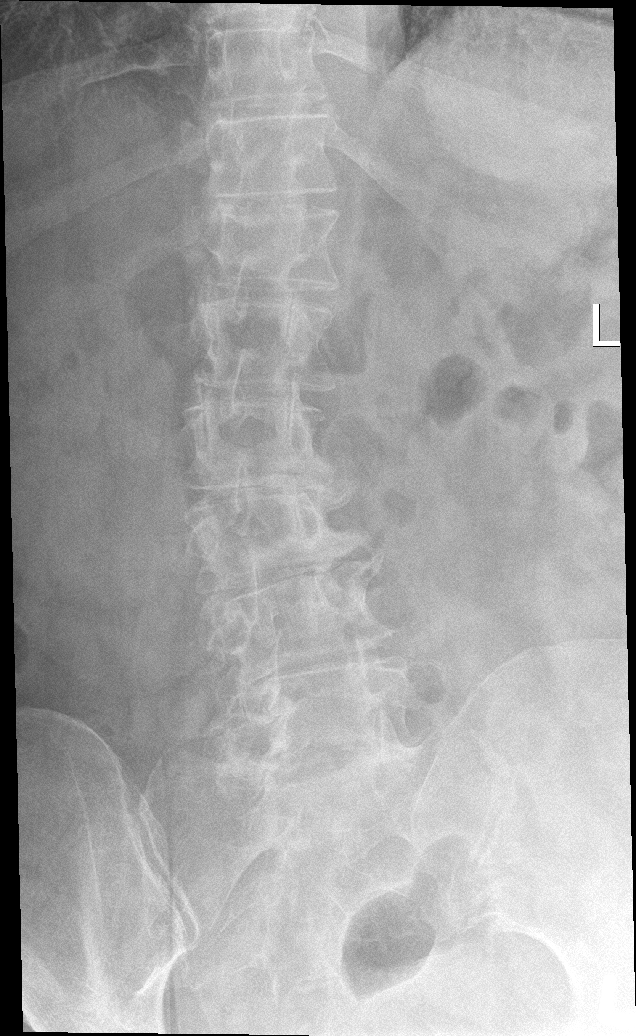

[l-spine lat]
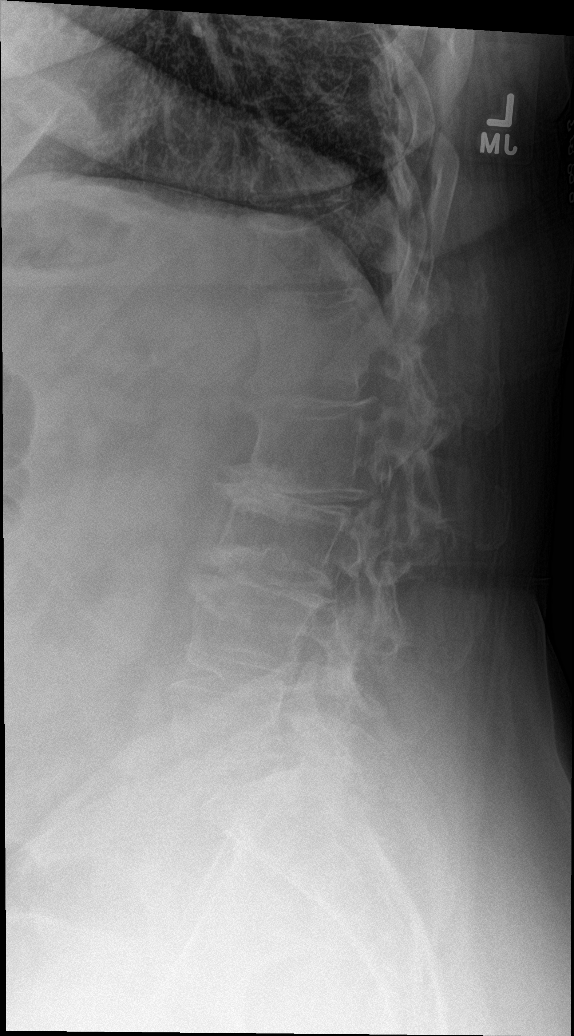

[l-spine flex]
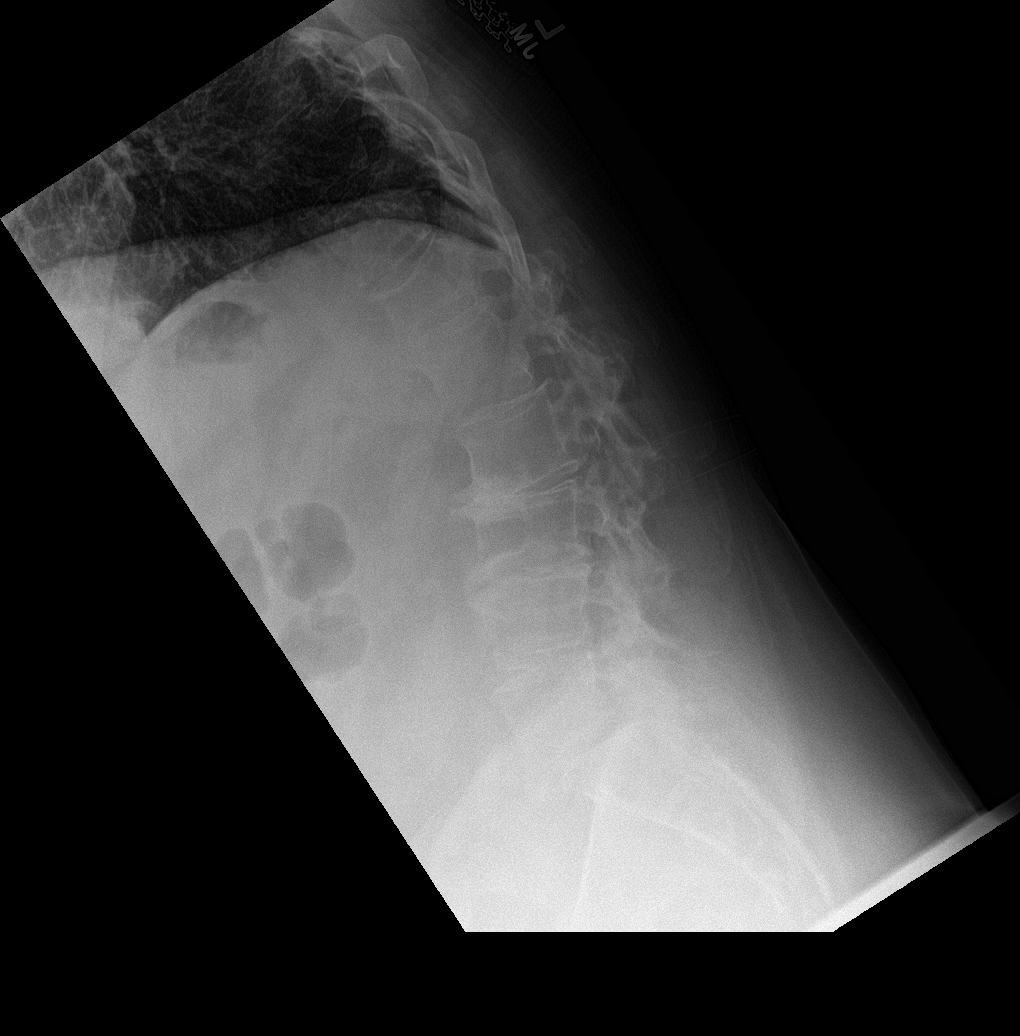

[l-spine ext]
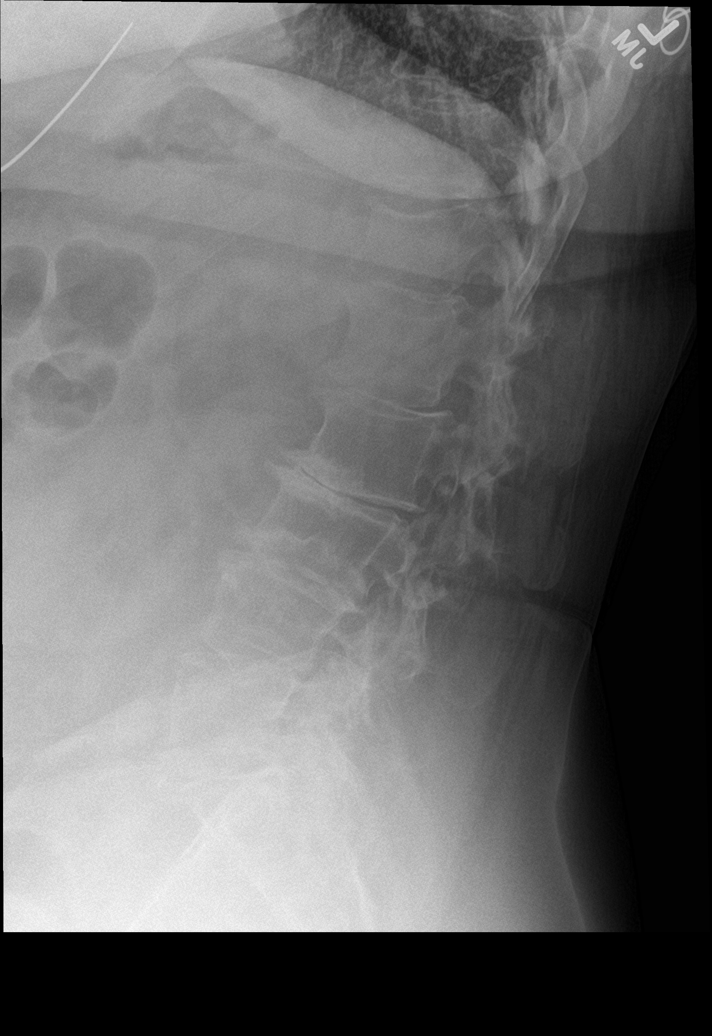

[l-spine spot]
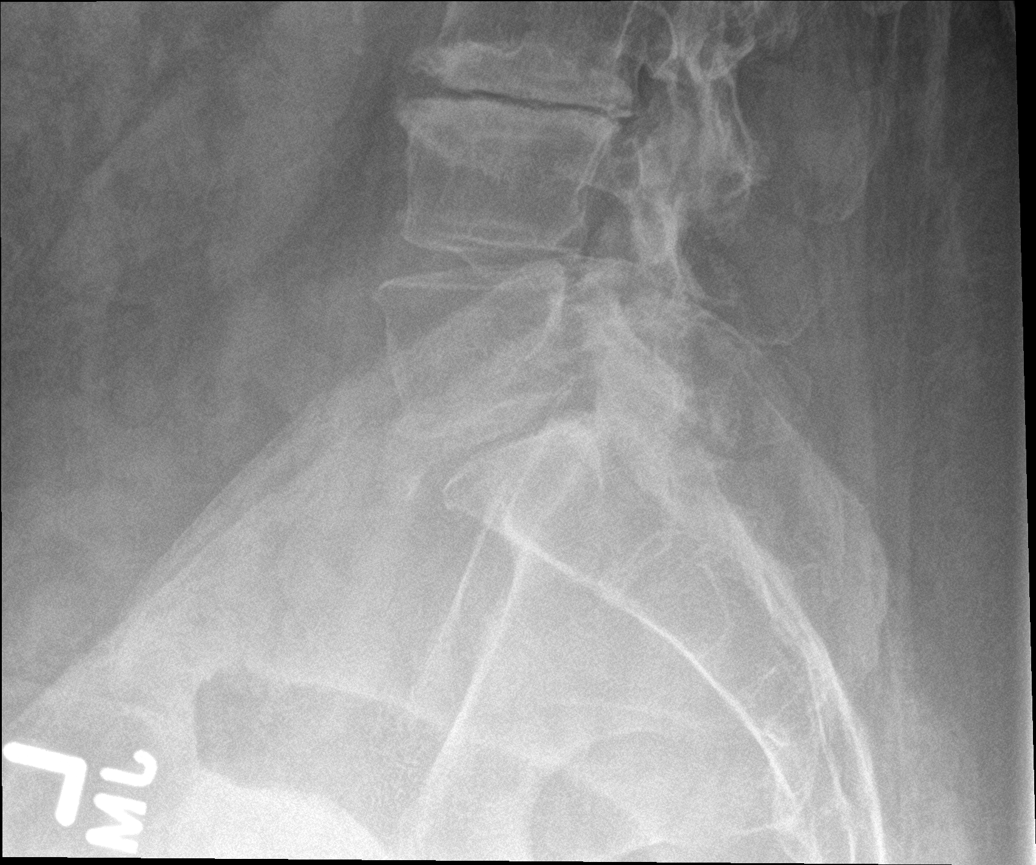

[7 of 7 positions shown; findings below may reference images not displayed]

FINDINGS: Scoliosis lumbar spine convex right.

T12 superior endplate mild compression fracture is new compared to
[R6] but appears remote with 15% loss of height. No retropulsion.

L1-2 mild disc space narrowing.

L2-3 marked disc space narrowing greatest anteriorly and left
lateral position. Mild rotation/retrolisthesis.

L3-4: Marked left-sided and moderate right-sided disc space
narrowing. Slight loss of height left lateral aspect of the
vertebra.

L4-5 mild to moderate disc space narrowing.  Minimal retrolisthesis.

L5-S1 mild disc space narrowing.

No pars defect detected.

No abnormal motion between flexion and extension.
IMPRESSION: 1. Scoliosis lumbar spine convex right with superimposed
degenerative changes most notable on the left at the L2-3 and L3-4
level as detailed above.
2. T12 superior endplate compression fracture with 15% loss of
height. Although new from [R6] CT, this has an appearance suggesting
this is remote in origin.
3. No abnormal motion between flexion and extension.

## 2018-08-05 ENCOUNTER — Encounter: Payer: Self-pay | Admitting: Pain Medicine

## 2018-08-05 ENCOUNTER — Ambulatory Visit
Admission: RE | Admit: 2018-08-05 | Discharge: 2018-08-05 | Disposition: A | Discharge status: Home / Self Care | Payer: Managed Care, Other (non HMO) | Source: Ambulatory Visit | Attending: Pain Medicine | Admitting: Pain Medicine

## 2018-08-05 ENCOUNTER — Ambulatory Visit (HOSPITAL_BASED_OUTPATIENT_CLINIC_OR_DEPARTMENT_OTHER): Payer: Managed Care, Other (non HMO) | Admitting: Pain Medicine

## 2018-08-05 ENCOUNTER — Other Ambulatory Visit: Payer: Self-pay

## 2018-08-05 VITALS — BP 164/71 | HR 91 | Temp 98.1°F | Resp 23 | Ht 68.0 in | Wt 205.0 lb

## 2018-08-05 DIAGNOSIS — M62830 Muscle spasm of back: Secondary | ICD-10-CM | POA: Insufficient documentation

## 2018-08-05 DIAGNOSIS — S22080S Wedge compression fracture of T11-T12 vertebra, sequela: Secondary | ICD-10-CM | POA: Insufficient documentation

## 2018-08-05 DIAGNOSIS — G8929 Other chronic pain: Secondary | ICD-10-CM | POA: Insufficient documentation

## 2018-08-05 DIAGNOSIS — M545 Low back pain: Secondary | ICD-10-CM | POA: Diagnosis present

## 2018-08-05 DIAGNOSIS — M5136 Other intervertebral disc degeneration, lumbar region: Secondary | ICD-10-CM | POA: Diagnosis not present

## 2018-08-05 DIAGNOSIS — S22080A Wedge compression fracture of T11-T12 vertebra, initial encounter for closed fracture: Secondary | ICD-10-CM

## 2018-08-05 DIAGNOSIS — Z789 Other specified health status: Secondary | ICD-10-CM | POA: Insufficient documentation

## 2018-08-05 DIAGNOSIS — S22089A Unspecified fracture of T11-T12 vertebra, initial encounter for closed fracture: Secondary | ICD-10-CM | POA: Insufficient documentation

## 2018-08-05 DIAGNOSIS — F419 Anxiety disorder, unspecified: Secondary | ICD-10-CM | POA: Diagnosis not present

## 2018-08-05 DIAGNOSIS — Z91040 Latex allergy status: Secondary | ICD-10-CM | POA: Diagnosis not present

## 2018-08-05 DIAGNOSIS — R937 Abnormal findings on diagnostic imaging of other parts of musculoskeletal system: Secondary | ICD-10-CM | POA: Insufficient documentation

## 2018-08-05 DIAGNOSIS — M47816 Spondylosis without myelopathy or radiculopathy, lumbar region: Secondary | ICD-10-CM | POA: Diagnosis not present

## 2018-08-05 DIAGNOSIS — Z79899 Other long term (current) drug therapy: Secondary | ICD-10-CM | POA: Insufficient documentation

## 2018-08-05 DIAGNOSIS — Z7982 Long term (current) use of aspirin: Secondary | ICD-10-CM | POA: Diagnosis not present

## 2018-08-05 DIAGNOSIS — Z7952 Long term (current) use of systemic steroids: Secondary | ICD-10-CM | POA: Insufficient documentation

## 2018-08-05 DIAGNOSIS — G894 Chronic pain syndrome: Secondary | ICD-10-CM | POA: Insufficient documentation

## 2018-08-05 DIAGNOSIS — X58XXXA Exposure to other specified factors, initial encounter: Secondary | ICD-10-CM | POA: Diagnosis not present

## 2018-08-05 DIAGNOSIS — M899 Disorder of bone, unspecified: Secondary | ICD-10-CM | POA: Insufficient documentation

## 2018-08-05 MED ORDER — ROPIVACAINE HCL 2 MG/ML IJ SOLN
2.0000 mL | Freq: Once | INTRAMUSCULAR | Status: AC
  Administered 2018-08-05: 2 mL via EPIDURAL
  Filled 2018-08-05: qty 10

## 2018-08-05 MED ORDER — DIAZEPAM 5 MG PO TABS: 5.0000 mg | ORAL_TABLET | ORAL | 0 refills | Status: DC | PRN

## 2018-08-05 MED ORDER — TRIAMCINOLONE ACETONIDE 40 MG/ML IJ SUSP
40.0000 mg | Freq: Once | INTRAMUSCULAR | Status: AC
  Administered 2018-08-05: 40 mg
  Filled 2018-08-05: qty 1

## 2018-08-05 MED ORDER — LACTATED RINGERS IV SOLN
1000.0000 mL | Freq: Once | INTRAVENOUS | Status: AC
  Administered 2018-08-05: 1000 mL via INTRAVENOUS

## 2018-08-05 MED ORDER — FENTANYL CITRATE (PF) 100 MCG/2ML IJ SOLN
25.0000 ug | INTRAMUSCULAR | Status: DC | PRN
  Administered 2018-08-05: 50 ug via INTRAVENOUS
  Filled 2018-08-05: qty 2

## 2018-08-05 MED ORDER — LIDOCAINE HCL 2 % IJ SOLN
20.0000 mL | Freq: Once | INTRAMUSCULAR | Status: AC
  Administered 2018-08-05: 400 mg
  Filled 2018-08-05: qty 40

## 2018-08-05 MED ORDER — SODIUM CHLORIDE 0.9% FLUSH
2.0000 mL | Freq: Once | INTRAVENOUS | Status: AC
  Administered 2018-08-05: 2 mL

## 2018-08-05 MED ORDER — DEXAMETHASONE SODIUM PHOSPHATE 10 MG/ML IJ SOLN: 10.0000 mg | Freq: Once | INTRAMUSCULAR | Status: DC

## 2018-08-05 MED ORDER — MIDAZOLAM HCL 5 MG/5ML IJ SOLN
1.0000 mg | INTRAMUSCULAR | Status: DC | PRN
  Administered 2018-08-05: 4 mg via INTRAVENOUS
  Filled 2018-08-05: qty 5

## 2018-08-05 MED ORDER — PREDNISONE 20 MG PO TABS: ORAL_TABLET | ORAL | 0 refills | Status: AC

## 2018-08-05 MED ORDER — IOPAMIDOL (ISOVUE-M 200) INJECTION 41%
10.0000 mL | Freq: Once | INTRAMUSCULAR | Status: AC
  Administered 2018-08-05: 10 mL via EPIDURAL
  Filled 2018-08-05: qty 10

## 2018-08-05 NOTE — Progress Notes (Signed)
Safety precautions to be maintained throughout the outpatient stay will include: orient to surroundings, keep bed in low position, maintain call bell within reach at all times, provide assistance with transfer out of bed and ambulation.  

## 2018-08-05 NOTE — Patient Instructions (Addendum)
____________________________________________________________________________________________  Post-Procedure Discharge Instructions  Instructions:  Apply ice: Fill a plastic sandwich bag with crushed ice. Cover it with a small towel and apply to injection site. Apply for 15 minutes then remove x 15 minutes. Repeat sequence on day of procedure, until you go to bed. The purpose is to minimize swelling and discomfort after procedure.  Apply heat: Apply heat to procedure site starting the day following the procedure. The purpose is to treat any soreness and discomfort from the procedure.  Food intake: Start with clear liquids (like water) and advance to regular food, as tolerated.   Physical activities: Keep activities to a minimum for the first 8 hours after the procedure.   Driving: If you have received any sedation, you are not allowed to drive for 24 hours after your procedure.  Blood thinner: Restart your blood thinner 6 hours after your procedure. (Only for those taking blood thinners)  Insulin: As soon as you can eat, you may resume your normal dosing schedule. (Only for those taking insulin)  Infection prevention: Keep procedure site clean and dry.  Post-procedure Pain Diary: Extremely important that this be done correctly and accurately. Recorded information will be used to determine the next step in treatment.  Pain evaluated is that of treated area only. Do not include pain from an untreated area.  Complete every hour, on the hour, for the initial 8 hours. Set an alarm to help you do this part accurately.  Do not go to sleep and have it completed later. It will not be accurate.  Follow-up appointment: Keep your follow-up appointment after the procedure. Usually 2 weeks for most procedures. (6 weeks in the case of radiofrequency.) Bring you pain diary.   Expect:  From numbing medicine (AKA: Local Anesthetics): Numbness or decrease in pain.  Onset: Full effect within 15  minutes of injected.  Duration: It will depend on the type of local anesthetic used. On the average, 1 to 8 hours.   From steroids: Decrease in swelling or inflammation. Once inflammation is improved, relief of the pain will follow.  Onset of benefits: Depends on the amount of swelling present. The more swelling, the longer it will take for the benefits to be seen. In some cases, up to 10 days.  Duration: Steroids will stay in the system x 2 weeks. Duration of benefits will depend on multiple posibilities including persistent irritating factors.  Occasional side-effects: Facial flushing, cramps (if present, drink Gatorade and take over-the-counter Magnesium 450-500 mg once to twice a day).  From procedure: Some discomfort is to be expected once the numbing medicine wears off. This should be minimal if ice and heat are applied as instructed.  Call if:  You experience numbness and weakness that gets worse with time, as opposed to wearing off.  New onset bowel or bladder incontinence. (This applies to Spinal procedures only)  Emergency Numbers:  [place] business hours (Monday - Thursday, 8:00 AM - 4:00 PM) (Friday, 9:00 AM - 12:00 Noon): [phone]  After hours: [phone] ____________________________________________________________________________________________   ____________________________________________________________________________________________  Muscle Spasms & Cramps  Cause:  The most common cause of muscle spasms and cramps is vitamin and/or electrolyte (calcium, potassium, sodium, etc.) deficiencies.  Possible triggers: Sweating - causes loss of electrolytes thru the skin. Steroids - causes loss of electrolytes thru the urine.  Treatment: 1. Gatorade (or any other electrolyte-replenishing drink) - Take 1, 8 oz glass with each meal (3 times a day). 2. OTC (over-the-counter) Magnesium 400 to 500 mg -  Take 1 tablet twice a day (one with breakfast and one  before bedtime). If you have kidney problems, talk to your primary care physician before taking any Magnesium. 3. Tonic Water with quinine - Take 1, 8 oz glass before bedtime.   ____________________________________________________________________________________________   Valium 5 mg qty 2 Rx given to patient for anxiety r/t :MRI

## 2018-08-05 NOTE — Progress Notes (Signed)
Patient's Name: Rhonda Espinoza  MRN: [id number]  Referring Provider: Milinda Pointer, MD  DOB: Jan 29, 1961  PCP: Chesley Noon, MD  DOS: 08/05/2018  Note by: Gaspar Cola, MD  Service setting: Ambulatory outpatient  Specialty: Interventional Pain Management  Patient type: Established  Location: [facility] (AMB) Pain Management Facility  Visit type: Interventional Procedure   Primary Reason for Visit: Interventional Pain Management Treatment. CC: Back Pain (lower)  Procedure:          Anesthesia, Analgesia, Anxiolysis:  Type: Diagnostic Inter-Laminar Epidural Steroid Injection  #1  Region: Lumbar Level: T12-L1 Level. Laterality: Midline         Type: Moderate (Conscious) Sedation combined with Local Anesthesia Indication(s): Analgesia and Anxiety Route: Intravenous (IV) IV Access: Secured Sedation: Meaningful verbal contact was maintained at all times during the procedure  Local Anesthetic: Lidocaine 1-2%  Position: Prone with head of the table was raised to facilitate breathing.   Indications: 1. DDD (degenerative disc disease), lumbar   2. Chronic low back pain (Bilateral) (R>L)   3. T12 compression fracture, initial encounter ([place])   4. Spondylosis without myelopathy or radiculopathy, lumbar region   5. Back muscle spasm   6. Abnormal x-ray of lumbar spine (08/02/2018)    Pain Score: Pre-procedure: 8 /10 Post-procedure: 8 /10  Pre-op Assessment:  Rhonda Espinoza is a 57 y.o. (year old), female patient, seen today for interventional treatment. She  has no past surgical history on file. Rhonda Espinoza has a current medication list which includes the following prescription(s): alprazolam, aspirin, atorvastatin, fenofibrate, vitamin d (ergocalciferol), zolpidem, citalopram, diazepam, lisinopril-hydrochlorothiazide, and prednisone, and the following Facility-Administered Medications: dexamethasone, fentanyl, fentanyl, lidocaine (pf), midazolam, midazolam, ropivacaine (pf) 2 mg/ml (0.2%),  sodium chloride flush, and triamcinolone acetonide. Her primarily concern today is the Back Pain (lower)  Initial Vital Signs:  Pulse/HCG Rate: 91ECG Heart Rate: 82 Temp: 98.2 F (36.8 C) Resp: 18 BP: (!) 164/91 SpO2: 96 %  BMI: Estimated body mass index is 31.17 kg/m as calculated from the following:   Height as of this encounter: 5\' 8"  (1.727 m).   Weight as of this encounter: 205 lb (93 kg).  Risk Assessment: Allergies: Reviewed. She is allergic to latex.  Allergy Precautions: None required Coagulopathies: Reviewed. None identified.  Blood-thinner therapy: None at this time Active Infection(s): Reviewed. None identified. Rhonda Espinoza is afebrile  Site Confirmation: Rhonda Espinoza was asked to confirm the procedure and laterality before marking the site Procedure checklist: Completed Consent: Before the procedure and under the influence of no sedative(s), amnesic(s), or anxiolytics, the patient was informed of the treatment options, risks and possible complications. To fulfill our ethical and legal obligations, as recommended by the American Medical Association's Code of Ethics, I have informed the patient of my clinical impression; the nature and purpose of the treatment or procedure; the risks, benefits, and possible complications of the intervention; the alternatives, including doing nothing; the risk(s) and benefit(s) of the alternative treatment(s) or procedure(s); and the risk(s) and benefit(s) of doing nothing. The patient was provided information about the general risks and possible complications associated with the procedure. These may include, but are not limited to: failure to achieve desired goals, infection, bleeding, organ or nerve damage, allergic reactions, paralysis, and death. In addition, the patient was informed of those risks and complications associated to Spine-related procedures, such as failure to decrease pain; infection (i.e.: Meningitis, epidural or intraspinal  abscess); bleeding (i.e.: epidural hematoma, subarachnoid hemorrhage, or any other type of intraspinal or peri-dural  bleeding); organ or nerve damage (i.e.: Any type of peripheral nerve, nerve root, or spinal cord injury) with subsequent damage to sensory, motor, and/or autonomic systems, resulting in permanent pain, numbness, and/or weakness of one or several areas of the body; allergic reactions; (i.e.: anaphylactic reaction); and/or death. Furthermore, the patient was informed of those risks and complications associated with the medications. These include, but are not limited to: allergic reactions (i.e.: anaphylactic or anaphylactoid reaction(s)); adrenal axis suppression; blood sugar elevation that in diabetics may result in ketoacidosis or comma; water retention that in patients with history of congestive heart failure may result in shortness of breath, pulmonary edema, and decompensation with resultant heart failure; weight gain; swelling or edema; medication-induced neural toxicity; particulate matter embolism and blood vessel occlusion with resultant organ, and/or nervous system infarction; and/or aseptic necrosis of one or more joints. Finally, the patient was informed that Medicine is not an exact science; therefore, there is also the possibility of unforeseen or unpredictable risks and/or possible complications that may result in a catastrophic outcome. The patient indicated having understood very clearly. We have given the patient no guarantees and we have made no promises. Enough time was given to the patient to ask questions, all of which were answered to the patient's satisfaction. Rhonda Espinoza has indicated that she wanted to continue with the procedure. Attestation: I, the ordering provider, attest that I have discussed with the patient the benefits, risks, side-effects, alternatives, likelihood of achieving goals, and potential problems during recovery for the procedure that I have provided  informed consent. Date  Time: 08/05/2018 12:48 PM  Pre-Procedure Preparation:  Monitoring: As per clinic protocol. Respiration, ETCO2, SpO2, BP, heart rate and rhythm monitor placed and checked for adequate function Safety Precautions: Patient was assessed for positional comfort and pressure points before starting the procedure. Time-out: I initiated and conducted the "Time-out" before starting the procedure, as per protocol. The patient was asked to participate by confirming the accuracy of the "Time Out" information. Verification of the correct person, site, and procedure were performed and confirmed by me, the nursing staff, and the patient. "Time-out" conducted as per Joint Commission's Universal Protocol (UP.01.01.01). Time: 1338  Description of Procedure:          Target Area: The interlaminar space, initially targeting the lower laminar border of the superior vertebral body. Approach: Paramedial approach. Area Prepped: Entire Posterior Lumbar Region Prepping solution: ChloraPrep (2% chlorhexidine gluconate and 70% isopropyl alcohol) Safety Precautions: Aspiration looking for blood return was conducted prior to all injections. At no point did we inject any substances, as a needle was being advanced. No attempts were made at seeking any paresthesias. Safe injection practices and needle disposal techniques used. Medications properly checked for expiration dates. SDV (single dose vial) medications used. Description of the Procedure: Protocol guidelines were followed. The procedure needle was introduced through the skin, ipsilateral to the reported pain, and advanced to the target area. Bone was contacted and the needle walked caudad, until the lamina was cleared. The epidural space was identified using "loss-of-resistance technique" with 2-3 ml of PF-NaCl (0.9% NSS), in a 5cc LOR glass syringe.  Vitals:   08/05/18 1345 08/05/18 1355 08/05/18 1405 08/05/18 1415  BP: (!) 146/70 (!) 195/90 (!)  176/79 (!) 164/71  Pulse:      Resp: 12 (!) 9 14 (!) 23  Temp:  97.7 F (36.5 C)  98.1 F (36.7 C)  TempSrc:  Temporal  Temporal  SpO2: 94% 97% 95% 97%  Weight:  Height:        Start Time: 1338 hrs. End Time: 1344 hrs.  Materials:  Needle(s) Type: Epidural needle Gauge: 17G Length: 3.5-in Medication(s): Please see orders for medications and dosing details.  Imaging Guidance (Spinal):          Type of Imaging Technique: Fluoroscopy Guidance (Spinal) Indication(s): Assistance in needle guidance and placement for procedures requiring needle placement in or near specific anatomical locations not easily accessible without such assistance. Exposure Time: Please see nurses notes. Contrast: Before injecting any contrast, we confirmed that the patient did not have an allergy to iodine, shellfish, or radiological contrast. Once satisfactory needle placement was completed at the desired level, radiological contrast was injected. Contrast injected under live fluoroscopy. No contrast complications. See chart for type and volume of contrast used. Fluoroscopic Guidance: I was personally present during the use of fluoroscopy. "Tunnel Vision Technique" used to obtain the best possible view of the target area. Parallax error corrected before commencing the procedure. "Direction-depth-direction" technique used to introduce the needle under continuous pulsed fluoroscopy. Once target was reached, antero-posterior, oblique, and lateral fluoroscopic projection used confirm needle placement in all planes. Images permanently stored in EMR. Interpretation: I personally interpreted the imaging intraoperatively. Adequate needle placement confirmed in multiple planes. Appropriate spread of contrast into desired area was observed. No evidence of afferent or efferent intravascular uptake. No intrathecal or subarachnoid spread observed. Permanent images saved into the patient's record.  Antibiotic Prophylaxis:    Anti-infectives (From admission, onward)   None     Indication(s): None identified  Post-operative Assessment:  Post-procedure Vital Signs:  Pulse/HCG Rate: 9177 Temp: 98.1 F (36.7 C) Resp: (!) 23 BP: (!) 164/71 SpO2: 97 %  EBL: None  Complications: No immediate post-treatment complications observed by team, or reported by patient.  Note: The patient tolerated the entire procedure well. A repeat set of vitals were taken after the procedure and the patient was kept under observation following institutional policy, for this type of procedure. Post-procedural neurological assessment was performed, showing return to baseline, prior to discharge. The patient was provided with post-procedure discharge instructions, including a section on how to identify potential problems. Should any problems arise concerning this procedure, the patient was given instructions to immediately contact us, at any time, without hesitation. In any case, we plan to contact the patient by telephone for a follow-up status report regarding this interventional procedure.  Comments:  No additional relevant information.  Plan of Care    Imaging Orders     DG C-Arm 1-60 Min-No Report     MR LUMBAR SPINE WO CONTRAST     DG [facility] DEXA  Procedure Orders     Lumbar Epidural Injection  Medications ordered for procedure: Meds ordered this encounter  Medications  . iopamidol (ISOVUE-M) 41 % intrathecal injection 10 mL    Must be Myelogram-compatible. If not available, you may substitute with a water-soluble, non-ionic, hypoallergenic, myelogram-compatible radiological contrast medium.  Marland Kitchen lidocaine (XYLOCAINE) 2 % (with pres) injection 400 mg  . midazolam (VERSED) 5 MG/5ML injection 1-2 mg    Make sure Flumazenil is available in the pyxis when using this medication. If oversedation occurs, administer 0.2 mg IV over 15 sec. If after 45 sec no response, administer 0.2 mg again over 1 min; may repeat at 1 min  intervals; not to exceed 4 doses (1 mg)  . fentaNYL (SUBLIMAZE) injection 25-50 mcg    Make sure Narcan is available in the pyxis when using this medication. In  the event of respiratory depression (RR< 8/min): Titrate NARCAN (naloxone) in increments of 0.1 to 0.2 mg IV at 2-3 minute intervals, until desired degree of reversal.  . lactated ringers infusion 1,000 mL  . sodium chloride flush (NS) 0.9 % injection 2 mL  . ropivacaine (PF) 2 mg/mL (0.2%) (NAROPIN) injection 2 mL  . triamcinolone acetonide (KENALOG-40) injection 40 mg  . diazepam (VALIUM) 5 MG tablet    Sig: Take 1 tablet (5 mg total) by mouth as needed for up to 2 doses for anxiety (Take one tab 45 minutes before MRI. Take second tablet just prior to MRI scan). Do not take medication within 4 hours of taking opioid pain medications. Must have a driver. Do not drive or operate machinery x 24 hours after taking this medication.    Dispense:  2 tablet    Refill:  0    Must have a driver. Do not drive or operate machinery x 24 hours after taking this medication.  . predniSONE (DELTASONE) 20 MG tablet    Sig: Take 3 tab(s) in the morning x 3 days, then 2 tab(s) x 3 days, followed by 1 tab x 3 days.    Dispense:  21 tablet    Refill:  0    Do not add to the "Automatic Refill" notification system.  Marland Kitchen dexamethasone (DECADRON) injection 10 mg   Medications administered: We administered iopamidol, lidocaine, midazolam, fentaNYL, lactated ringers, sodium chloride flush, ropivacaine (PF) 2 mg/mL (0.2%), and triamcinolone acetonide.  See the medical record for exact dosing, route, and time of administration.  Disposition: Discharge home  Discharge Date & Time: 08/05/2018; 1419 hrs.   Physician-requested Follow-up: Return for post-procedure eval (2 wks), w/ Dr. Dossie Arbour, F/U eval (after test completion).  Future Appointments  Date Time Provider [place]  08/23/2018  9:15 AM Milinda Pointer, MD [facility] None   Primary Care  Physician: Chesley Noon, MD Location: [facility] Note by: Gaspar Cola, MD Date: 08/05/2018; Time: 3:34 PM  Disclaimer:  Medicine is not an exact science. The only guarantee in medicine is that nothing is guaranteed. It is important to note that the decision to proceed with this intervention was based on the information collected from the patient. The Data and conclusions were drawn from the patient's questionnaire, the interview, and the physical examination. Because the information was provided in large part by the patient, it cannot be guaranteed that it has not been purposely or unconsciously manipulated. Every effort has been made to obtain as much relevant data as possible for this evaluation. It is important to note that the conclusions that lead to this procedure are derived in large part from the available data. Always take into account that the treatment will also be dependent on availability of resources and existing treatment guidelines, considered by other Pain Management Practitioners as being common knowledge and practice, at the time of the intervention. For Medico-Legal purposes, it is also important to point out that variation in procedural techniques and pharmacological choices are the acceptable norm. The indications, contraindications, technique, and results of the above procedure should only be interpreted and judged by a Board-Certified Interventional Pain Specialist with extensive familiarity and expertise in the same exact procedure and technique.

## 2018-08-06 ENCOUNTER — Telehealth: Payer: Self-pay

## 2018-08-06 NOTE — Telephone Encounter (Signed)
Post procedure phone call.  Patient states she is doing better.  

## 2018-08-09 ENCOUNTER — Telehealth: Payer: Self-pay | Admitting: *Deleted

## 2018-08-10 ENCOUNTER — Ambulatory Visit: Payer: Managed Care, Other (non HMO) | Admitting: Pain Medicine

## 2018-08-10 ENCOUNTER — Telehealth: Payer: Self-pay | Admitting: Pain Medicine

## 2018-08-10 ENCOUNTER — Telehealth: Payer: Self-pay

## 2018-08-10 LAB — COMP. METABOLIC PANEL (12)
A/G RATIO: 1.6 (ref 1.2–2.2)
ALBUMIN: 4 g/dL (ref 3.5–5.5)
ALK PHOS: 127 IU/L — AB (ref 39–117)
AST: 81 IU/L — ABNORMAL HIGH (ref 0–40)
BILIRUBIN TOTAL: 0.4 mg/dL (ref 0.0–1.2)
BUN / CREAT RATIO: 12 (ref 9–23)
BUN: 9 mg/dL (ref 6–24)
CHLORIDE: 104 mmol/L (ref 96–106)
Calcium: 9.4 mg/dL (ref 8.7–10.2)
Creatinine, Ser: 0.74 mg/dL (ref 0.57–1.00)
GFR calc Af Amer: 105 mL/min/{1.73_m2} (ref >59)
GFR calc non Af Amer: 91 mL/min/{1.73_m2} (ref >59)
Globulin, Total: 2.5 g/dL (ref 1.5–4.5)
Glucose: 97 mg/dL (ref 65–99)
POTASSIUM: 5.3 mmol/L — AB (ref 3.5–5.2)
SODIUM: 144 mmol/L (ref 134–144)
TOTAL PROTEIN: 6.5 g/dL (ref 6.0–8.5)

## 2018-08-10 LAB — 25-HYDROXY VITAMIN D LCMS D2+D3
25-Hydroxy, Vitamin D-2: 32 ng/mL
25-Hydroxy, Vitamin D-3: 7.2 ng/mL
25-Hydroxy, Vitamin D: 39 ng/mL

## 2018-08-10 LAB — C-REACTIVE PROTEIN: CRP: 5 mg/L (ref 0–10)

## 2018-08-10 LAB — MAGNESIUM: MAGNESIUM: 1.9 mg/dL (ref 1.6–2.3)

## 2018-08-10 LAB — VITAMIN B12: Vitamin B-12: 422 pg/mL (ref 232–1245)

## 2018-08-10 LAB — SEDIMENTATION RATE: Sed Rate: 43 mm/hr — ABNORMAL HIGH (ref 0–40)

## 2018-08-10 NOTE — Telephone Encounter (Signed)
Blanch Media, I received this yesterday as a staff message.  Do you need this information?    Andrey Campanile, RN        Hi Rollins Wrightson.   Patient is scheduled at [facility] to have a MRI. Their Christella Scheuermann requires authorization thru [facility]. Wanted to be sure your office was aware.    Thank you for your help.  Dorian Pod

## 2018-08-10 NOTE — Telephone Encounter (Signed)
Patient is going to PCP on Wed 08-11-18 would like to be able to give labs to them when she does. They are not released yet and she cannot pull up in my chart. Can you get this for patient and let her know when she can pull up for PCP tomorrow

## 2018-08-11 ENCOUNTER — Other Ambulatory Visit: Payer: Self-pay | Admitting: Pain Medicine

## 2018-08-11 NOTE — Telephone Encounter (Signed)
Rhonda Espinoza gets prior auth before scheduling MRI's.

## 2018-08-11 NOTE — Telephone Encounter (Signed)
Notified patient that she can now access her tests on My Chart.

## 2018-08-14 ENCOUNTER — Other Ambulatory Visit: Payer: Managed Care, Other (non HMO)

## 2018-08-19 ENCOUNTER — Other Ambulatory Visit: Payer: Self-pay | Admitting: Pain Medicine

## 2018-08-19 DIAGNOSIS — M48061 Spinal stenosis, lumbar region without neurogenic claudication: Secondary | ICD-10-CM | POA: Insufficient documentation

## 2018-08-19 DIAGNOSIS — M47816 Spondylosis without myelopathy or radiculopathy, lumbar region: Secondary | ICD-10-CM | POA: Insufficient documentation

## 2018-08-23 ENCOUNTER — Ambulatory Visit: Payer: Managed Care, Other (non HMO) | Admitting: Pain Medicine

## 2018-08-23 ENCOUNTER — Other Ambulatory Visit: Payer: Self-pay | Admitting: Pain Medicine

## 2018-08-23 ENCOUNTER — Encounter: Payer: Self-pay | Admitting: Pain Medicine

## 2018-08-23 DIAGNOSIS — R937 Abnormal findings on diagnostic imaging of other parts of musculoskeletal system: Secondary | ICD-10-CM | POA: Insufficient documentation

## 2018-08-23 DIAGNOSIS — M48061 Spinal stenosis, lumbar region without neurogenic claudication: Secondary | ICD-10-CM | POA: Insufficient documentation

## 2018-08-23 DIAGNOSIS — M4310 Spondylolisthesis, site unspecified: Secondary | ICD-10-CM | POA: Insufficient documentation

## 2018-08-24 NOTE — Telephone Encounter (Signed)
MRI completed @ [facility]

## 2018-08-30 ENCOUNTER — Other Ambulatory Visit: Payer: Self-pay | Admitting: Pain Medicine

## 2018-08-30 ENCOUNTER — Telehealth: Payer: Self-pay

## 2018-08-30 DIAGNOSIS — R937 Abnormal findings on diagnostic imaging of other parts of musculoskeletal system: Secondary | ICD-10-CM

## 2018-08-30 DIAGNOSIS — M545 Low back pain, unspecified: Secondary | ICD-10-CM

## 2018-08-30 DIAGNOSIS — G8929 Other chronic pain: Secondary | ICD-10-CM

## 2018-08-30 DIAGNOSIS — N289 Disorder of kidney and ureter, unspecified: Secondary | ICD-10-CM

## 2018-08-30 NOTE — Telephone Encounter (Signed)
Attempted to call patient and notify her that we had sent a referral to [facility] for a kidney Ultrasound, No PA needed.  We also sent a referral to Dr Doylene Canning, urologist at [facility] in [place], # [phone].  Left message for patient to call us back.

## 2018-08-30 NOTE — Progress Notes (Signed)
Recent MRI with findings of a right kidney upper pole lesion (approximately 1.6 cm), hyperintense on both T1 and T2-weighted sequences, potentially due to proteinaceous contents or hemorrhage. Due to incompletely characterized by 2019 MRI, an ultrasound was recommended for further evaluation.

## 2018-10-07 ENCOUNTER — Other Ambulatory Visit: Payer: Self-pay | Admitting: Orthopedic Surgery

## 2018-10-08 NOTE — Pre-Procedure Instructions (Signed)
Rhonda Espinoza  10/08/2018      [id number] - [place], [address] [place] [address] Phone: [phone] Fax: [phone]    Your procedure is scheduled on Wednesday January 15th.  Report to [facility] Admitting at 1230 P.M.  Call this number if you have problems the morning of surgery:  [phone]   Remember:  Do not eat or drink after midnight.    Take these medicines the morning of surgery with A SIP OF WATER  ALPRAZolam (XANAX)  If needed atorvastatin (LIPITOR)  citalopram (CELEXA) fenofibrate   Follow your surgeon's instructions on when to stop Asprin.  If no instructions were given by your surgeon then you will need to call the office to get those instructions.    7 days prior to surgery STOP taking any Aspirin (unless otherwise instructed by your surgeon), Aleve, Naproxen, Ibuprofen, Motrin, Advil, Goody's, BC's, all herbal medications, fish oil, and all vitamins.     Do not wear jewelry, make-up or nail polish.  Do not wear lotions, powders, or perfumes, or deodorant.  Do not shave 48 hours prior to surgery.  Men may shave face and neck.  Do not bring valuables to the hospital.  [facility] is not responsible for any belongings or valuables.  Contacts, dentures or bridgework may not be worn into surgery.  Leave your suitcase in the car.  After surgery it may be brought to your room.  For patients admitted to the hospital, discharge time will be determined by your treatment team.  Patients discharged the day of surgery will not be allowed to drive home.    [facility]- Preparing For Surgery  Before surgery, you can play an important role. Because skin is not sterile, your skin needs to be as free of germs as possible. You can reduce the number of germs on your skin by washing with CHG (chlorahexidine gluconate) Soap before surgery.  CHG is an antiseptic cleaner which kills germs and bonds with the skin to  continue killing germs even after washing.    Oral Hygiene is also important to reduce your risk of infection.  Remember - BRUSH YOUR TEETH THE MORNING OF SURGERY WITH YOUR REGULAR TOOTHPASTE  Please do not use if you have an allergy to CHG or antibacterial soaps. If your skin becomes reddened/irritated stop using the CHG.  Do not shave (including legs and underarms) for at least 48 hours prior to first CHG shower. It is OK to shave your face.  Please follow these instructions carefully.   1. Shower the NIGHT BEFORE SURGERY and the MORNING OF SURGERY with CHG.   2. If you chose to wash your hair, wash your hair first as usual with your normal shampoo.  3. After you shampoo, rinse your hair and body thoroughly to remove the shampoo.  4. Use CHG as you would any other liquid soap. You can apply CHG directly to the skin and wash gently with a scrungie or a clean washcloth.   5. Apply the CHG Soap to your body ONLY FROM THE NECK DOWN.  Do not use on open wounds or open sores. Avoid contact with your eyes, ears, mouth and genitals (private parts). Wash Face and genitals (private parts)  with your normal soap.  6. Wash thoroughly, paying special attention to the area where your surgery will be performed.  7. Thoroughly rinse your body with warm water from the neck down.  8. DO  NOT shower/wash with your normal soap after using and rinsing off the CHG Soap.  9. Pat yourself dry with a CLEAN TOWEL.  10. Wear CLEAN PAJAMAS to bed the night before surgery, wear comfortable clothes the morning of surgery  11. Place CLEAN SHEETS on your bed the night of your first shower and DO NOT SLEEP WITH PETS.    Day of Surgery:  Do not apply any deodorants/lotions.  Please wear clean clothes to the hospital/surgery center.   Remember to brush your teeth WITH YOUR REGULAR TOOTHPASTE.    Please read over the following fact sheets that you were given.

## 2018-10-11 ENCOUNTER — Other Ambulatory Visit: Payer: Self-pay

## 2018-10-11 ENCOUNTER — Encounter (HOSPITAL_COMMUNITY): Payer: Self-pay

## 2018-10-11 ENCOUNTER — Encounter (HOSPITAL_COMMUNITY)
Admission: RE | Admit: 2018-10-11 | Discharge: 2018-10-11 | Disposition: A | Discharge status: Home / Self Care | Payer: Managed Care, Other (non HMO) | Source: Ambulatory Visit | Attending: Orthopedic Surgery | Admitting: Orthopedic Surgery

## 2018-10-11 DIAGNOSIS — I10 Essential (primary) hypertension: Secondary | ICD-10-CM | POA: Diagnosis not present

## 2018-10-11 DIAGNOSIS — M1990 Unspecified osteoarthritis, unspecified site: Secondary | ICD-10-CM | POA: Diagnosis not present

## 2018-10-11 DIAGNOSIS — J45909 Unspecified asthma, uncomplicated: Secondary | ICD-10-CM | POA: Diagnosis not present

## 2018-10-11 DIAGNOSIS — Z79899 Other long term (current) drug therapy: Secondary | ICD-10-CM | POA: Diagnosis not present

## 2018-10-11 DIAGNOSIS — Z01818 Encounter for other preprocedural examination: Secondary | ICD-10-CM

## 2018-10-11 DIAGNOSIS — M4854XA Collapsed vertebra, not elsewhere classified, thoracic region, initial encounter for fracture: Secondary | ICD-10-CM | POA: Diagnosis present

## 2018-10-11 DIAGNOSIS — E785 Hyperlipidemia, unspecified: Secondary | ICD-10-CM | POA: Diagnosis not present

## 2018-10-11 DIAGNOSIS — F419 Anxiety disorder, unspecified: Secondary | ICD-10-CM | POA: Diagnosis not present

## 2018-10-11 DIAGNOSIS — Z7982 Long term (current) use of aspirin: Secondary | ICD-10-CM | POA: Diagnosis not present

## 2018-10-11 HISTORY — DX: Pneumonia, unspecified organism: J18.9

## 2018-10-11 HISTORY — DX: Unspecified osteoarthritis, unspecified site: M19.90

## 2018-10-11 HISTORY — DX: Anxiety disorder, unspecified: F41.9

## 2018-10-11 HISTORY — DX: Other specified behavioral and emotional disorders with onset usually occurring in childhood and adolescence: F98.8

## 2018-10-11 HISTORY — DX: Hyperlipidemia, unspecified: E78.5

## 2018-10-11 HISTORY — DX: Essential (primary) hypertension: I10

## 2018-10-11 HISTORY — DX: Unspecified asthma, uncomplicated: J45.909

## 2018-10-11 LAB — URINALYSIS, ROUTINE W REFLEX MICROSCOPIC
Bilirubin Urine: NEGATIVE
Glucose, UA: NEGATIVE mg/dL
Hgb urine dipstick: NEGATIVE
Ketones, ur: NEGATIVE mg/dL
Nitrite: NEGATIVE
Protein, ur: NEGATIVE mg/dL
Specific Gravity, Urine: 1.01 (ref 1.005–1.030)
pH: 6 (ref 5.0–8.0)

## 2018-10-11 LAB — COMPREHENSIVE METABOLIC PANEL
ALBUMIN: 4.1 g/dL (ref 3.5–5.0)
ALT: 61 U/L — ABNORMAL HIGH (ref 0–44)
AST: 71 U/L — ABNORMAL HIGH (ref 15–41)
Alkaline Phosphatase: 59 U/L (ref 38–126)
Anion gap: 11 (ref 5–15)
BUN: 5 mg/dL — ABNORMAL LOW (ref 6–20)
CO2: 23 mmol/L (ref 22–32)
Calcium: 9.2 mg/dL (ref 8.9–10.3)
Chloride: 100 mmol/L (ref 98–111)
Creatinine, Ser: 0.78 mg/dL (ref 0.44–1.00)
GFR calc Af Amer: >60 mL/min (ref >60)
GFR calc non Af Amer: >60 mL/min (ref >60)
Glucose, Bld: 114 mg/dL — ABNORMAL HIGH (ref 70–99)
Potassium: 4.1 mmol/L (ref 3.5–5.1)
Sodium: 134 mmol/L — ABNORMAL LOW (ref 135–145)
Total Bilirubin: 0.7 mg/dL (ref 0.3–1.2)
Total Protein: 7.2 g/dL (ref 6.5–8.1)

## 2018-10-11 LAB — CBC WITH DIFFERENTIAL/PLATELET
Abs Immature Granulocytes: 0.03 10*3/uL (ref 0.00–0.07)
Basophils Absolute: 0.1 10*3/uL (ref 0.0–0.1)
Basophils Relative: 1 %
EOS PCT: 4 %
Eosinophils Absolute: 0.3 10*3/uL (ref 0.0–0.5)
HCT: 40.1 % (ref 36.0–46.0)
Hemoglobin: 13.3 g/dL (ref 12.0–15.0)
Immature Granulocytes: 0 %
Lymphocytes Relative: 34 %
Lymphs Abs: 2.7 10*3/uL (ref 0.7–4.0)
MCH: 33.5 pg (ref 26.0–34.0)
MCHC: 33.2 g/dL (ref 30.0–36.0)
MCV: 101 fL — ABNORMAL HIGH (ref 80.0–100.0)
MONOS PCT: 8 %
Monocytes Absolute: 0.7 10*3/uL (ref 0.1–1.0)
Neutro Abs: 4.2 10*3/uL (ref 1.7–7.7)
Neutrophils Relative %: 53 %
Platelets: 411 10*3/uL — ABNORMAL HIGH (ref 150–400)
RBC: 3.97 MIL/uL (ref 3.87–5.11)
RDW: 15.7 % — ABNORMAL HIGH (ref 11.5–15.5)
WBC: 8 10*3/uL (ref 4.0–10.5)
nRBC: 0 % (ref 0.0–0.2)

## 2018-10-11 LAB — PROTIME-INR
INR: 1.08
Prothrombin Time: 13.9 seconds (ref 11.4–15.2)

## 2018-10-11 LAB — TYPE AND SCREEN
ABO/RH(D): A POS
Antibody Screen: NEGATIVE

## 2018-10-11 LAB — URINALYSIS, MICROSCOPIC (REFLEX): RBC / HPF: NONE SEEN RBC/hpf (ref 0–5)

## 2018-10-11 LAB — APTT: aPTT: 29 seconds (ref 24–36)

## 2018-10-11 LAB — SURGICAL PCR SCREEN
MRSA, PCR: NEGATIVE
Staphylococcus aureus: POSITIVE — AB

## 2018-10-11 NOTE — Pre-Procedure Instructions (Signed)
Rhonda Espinoza  10/11/2018      [id number] [address] [place] [address] Phone: [phone] Fax: [phone]    Your procedure is scheduled on Wednesday January 15th.  Report to [facility] Admitting at 1230 P.M.  Call this number if you have problems the morning of surgery:  [phone]   Remember:  Do not eat or drink after midnight.    Take these medicines the morning of surgery with A SIP OF WATER  Acetaminophen (Tylenol) - if needed Albuterol (Proventil; Ventolin) inhaler - if needed ALPRAZolam [name] - if needed Bupropion (Wellbutrin) Fluticasone (FLonase) - if needed   Follow your surgeon's instructions on when to stop Asprin.  If no instructions were given by your surgeon then you will need to call the office to get those instructions.    7 days prior to surgery STOP taking any Aspirin (unless otherwise instructed by your surgeon), Aleve, Naproxen, Ibuprofen, Motrin, Advil, Goody's, BC's, all herbal medications, fish oil, and all vitamins.     Do not wear jewelry, make-up or nail polish.  Do not wear lotions, powders, or perfumes, or deodorant.  Do not shave 48 hours prior to surgery.   Do not bring valuables to the hospital.  [facility] is not responsible for any belongings or valuables.  Contacts, eyeglasses, hearing aids, dentures or bridgework may not be worn into surgery.  Leave your suitcase in the car.  After surgery it may be brought to your room.  For patients admitted to the hospital, discharge time will be determined by your treatment team.  Patients discharged the day of surgery will not be allowed to drive home.    [facility]- Preparing For Surgery  Before surgery, you can play an important role. Because skin is not sterile, your skin needs to be as free of germs as possible. You can reduce the number of germs on your skin by washing with CHG (chlorahexidine gluconate) Soap before  surgery.  CHG is an antiseptic cleaner which kills germs and bonds with the skin to continue killing germs even after washing.    Oral Hygiene is also important to reduce your risk of infection.  Remember - BRUSH YOUR TEETH THE MORNING OF SURGERY WITH YOUR REGULAR TOOTHPASTE  Please do not use if you have an allergy to CHG or antibacterial soaps. If your skin becomes reddened/irritated stop using the CHG.  Do not shave (including legs and underarms) for at least 48 hours prior to first CHG shower. It is OK to shave your face.  Please follow these instructions carefully.   1. Shower the NIGHT BEFORE SURGERY and the MORNING OF SURGERY with CHG.   2. If you chose to wash your hair, wash your hair first as usual with your normal shampoo.  3. After you shampoo, rinse your hair and body thoroughly to remove the shampoo.  4. Use CHG as you would any other liquid soap. You can apply CHG directly to the skin and wash gently with a scrungie or a clean washcloth.   5. Apply the CHG Soap to your body ONLY FROM THE NECK DOWN.  Do not use on open wounds or open sores. Avoid contact with your eyes, ears, mouth and genitals (private parts). Wash Face and genitals (private parts)  with your normal soap.  6. Wash thoroughly, paying special attention to the area where your surgery will be performed.  7. Thoroughly rinse your body  with warm water from the neck down.  8. DO NOT shower/wash with your normal soap after using and rinsing off the CHG Soap.  9. Pat yourself dry with a CLEAN TOWEL.  10. Wear CLEAN PAJAMAS to bed the night before surgery, wear comfortable clothes the morning of surgery  11. Place CLEAN SHEETS on your bed the night of your first shower and DO NOT SLEEP WITH PETS.    Day of Surgery: Shower as stated above. Do not apply any deodorants/lotions.  Please wear clean clothes to the hospital/surgery center.   Remember to brush your teeth WITH YOUR REGULAR TOOTHPASTE.    Please  read over the following fact sheets that you were given.

## 2018-10-11 NOTE — Progress Notes (Signed)
Notified Dr. Lynann Bologna of abnormal UA via IB message.

## 2018-10-11 NOTE — Progress Notes (Signed)
PCP - Dr. Melford Aase Cardiologist - patient denies  Chest x-ray - 11/19/2017 EKG - 10/11/2018 Stress Test - patient states that she has had a stress test in past about 6-7 years ago; was anxiety/panic attack induced, not cardiac related, test was normal ECHO - patient denies Cardiac Cath - patient denies  Sleep Study - patient denies CPAP -   Fasting Blood Sugar - n/a Checks Blood Sugar _____ times a day  Blood Thinner Instructions: n/a Aspirin Instructions: patient does not take it regularly and it "has been a while" since she last took it.  Anesthesia review: n/a  Patient denies shortness of breath, fever, cough and chest pain at PAT appointment   Patient verbalized understanding of instructions that were given to them at the PAT appointment. Patient was also instructed that they will need to review over the PAT instructions again at home before surgery.

## 2018-10-12 ENCOUNTER — Other Ambulatory Visit: Payer: Self-pay | Admitting: Pain Medicine

## 2018-10-12 LAB — ABO/RH: ABO/RH(D): A POS

## 2018-10-13 ENCOUNTER — Encounter (HOSPITAL_COMMUNITY)
Admission: RE | Disposition: A | Discharge status: Home / Self Care | Payer: Self-pay | Source: Home / Self Care | Attending: Orthopedic Surgery

## 2018-10-13 ENCOUNTER — Ambulatory Visit (HOSPITAL_COMMUNITY): Payer: Managed Care, Other (non HMO)

## 2018-10-13 ENCOUNTER — Ambulatory Visit (HOSPITAL_COMMUNITY): Payer: Managed Care, Other (non HMO) | Admitting: Anesthesiology

## 2018-10-13 ENCOUNTER — Ambulatory Visit (HOSPITAL_COMMUNITY)
Admission: RE | Admit: 2018-10-13 | Discharge: 2018-10-13 | Disposition: A | Discharge status: Home / Self Care | Payer: Managed Care, Other (non HMO) | Attending: Orthopedic Surgery | Admitting: Orthopedic Surgery

## 2018-10-13 ENCOUNTER — Encounter (HOSPITAL_COMMUNITY): Payer: Self-pay

## 2018-10-13 DIAGNOSIS — Z7982 Long term (current) use of aspirin: Secondary | ICD-10-CM | POA: Insufficient documentation

## 2018-10-13 DIAGNOSIS — I10 Essential (primary) hypertension: Secondary | ICD-10-CM | POA: Insufficient documentation

## 2018-10-13 DIAGNOSIS — M1990 Unspecified osteoarthritis, unspecified site: Secondary | ICD-10-CM | POA: Insufficient documentation

## 2018-10-13 DIAGNOSIS — E785 Hyperlipidemia, unspecified: Secondary | ICD-10-CM | POA: Insufficient documentation

## 2018-10-13 DIAGNOSIS — M4854XA Collapsed vertebra, not elsewhere classified, thoracic region, initial encounter for fracture: Secondary | ICD-10-CM | POA: Insufficient documentation

## 2018-10-13 DIAGNOSIS — Z79899 Other long term (current) drug therapy: Secondary | ICD-10-CM | POA: Insufficient documentation

## 2018-10-13 DIAGNOSIS — Z419 Encounter for procedure for purposes other than remedying health state, unspecified: Secondary | ICD-10-CM

## 2018-10-13 DIAGNOSIS — F419 Anxiety disorder, unspecified: Secondary | ICD-10-CM | POA: Insufficient documentation

## 2018-10-13 DIAGNOSIS — J45909 Unspecified asthma, uncomplicated: Secondary | ICD-10-CM | POA: Insufficient documentation

## 2018-10-13 HISTORY — PX: KYPHOPLASTY: SHX5884

## 2018-10-13 IMAGING — RF DG C-ARM 61-120 MIN
1 series · 1 of 1 positions shown · non-contrast
Comparison: None.

CLINICAL DATA: 57-year-old female undergoing T12 augmentation with
kyphoplasty.

EXAM:
LUMBAR SPINE - 2-3 VIEW; DG C-ARM 61-120 MIN

[Series 1: run · 1 of 1 slices shown]
[im 1/1]
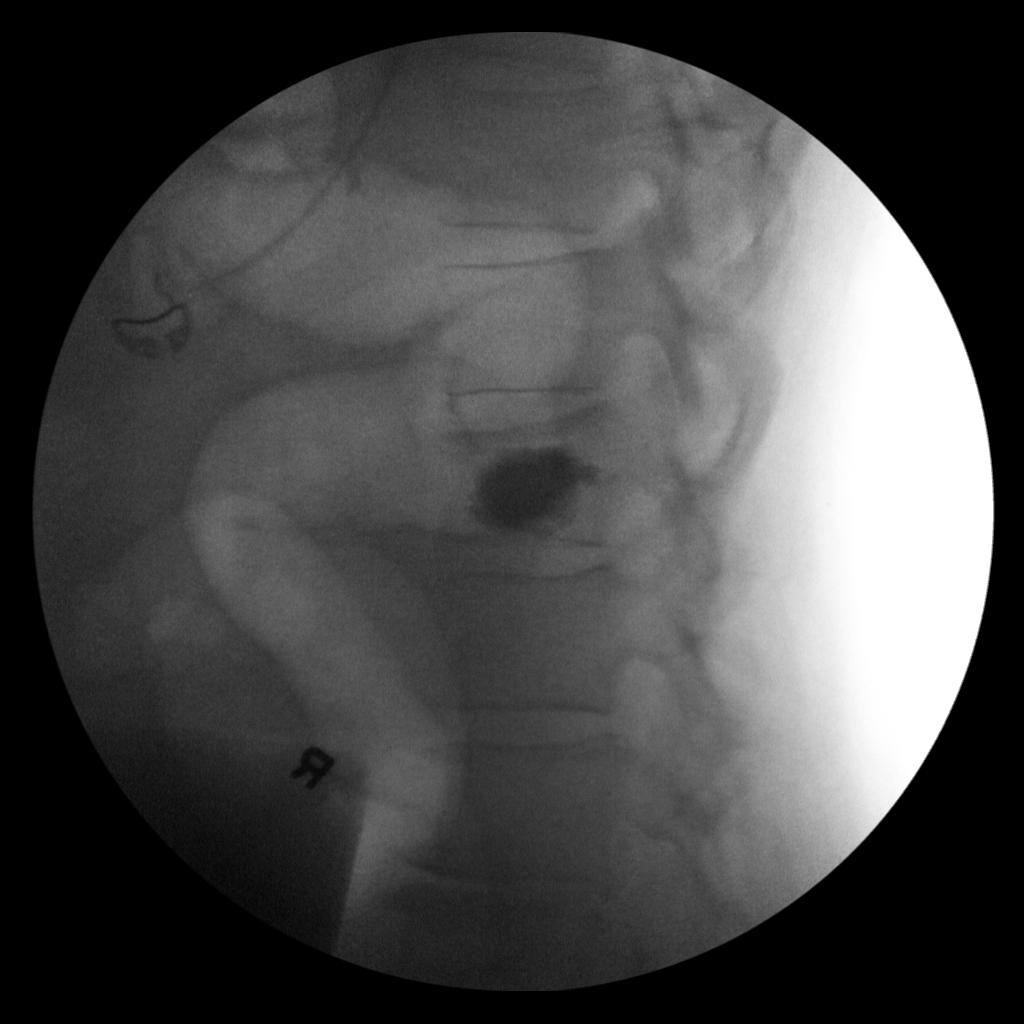

[1 of 1 positions shown; findings below may reference images not displayed]

FINDINGS: Frontal and lateral radiographs of the thoracolumbar spine are
submitted for review. The left rib appears hypoplastic. Cement is
present in both the right and left aspects of the vertebral body
consistent with a bipedicular kyphoplasty. There is a small amount
of radiopaque cement within a right paravertebral vein.
IMPRESSION: T12 bipedicular kyphoplasty.

## 2018-10-13 IMAGING — RF DG C-ARM 61-120 MIN
1 series · 1 of 1 positions shown · non-contrast
Comparison: None.

CLINICAL DATA: 57-year-old female undergoing T12 augmentation with
kyphoplasty.

EXAM:
LUMBAR SPINE - 2-3 VIEW; DG C-ARM 61-120 MIN

[Series 1: run · 1 of 1 slices shown]
[im 1/1]
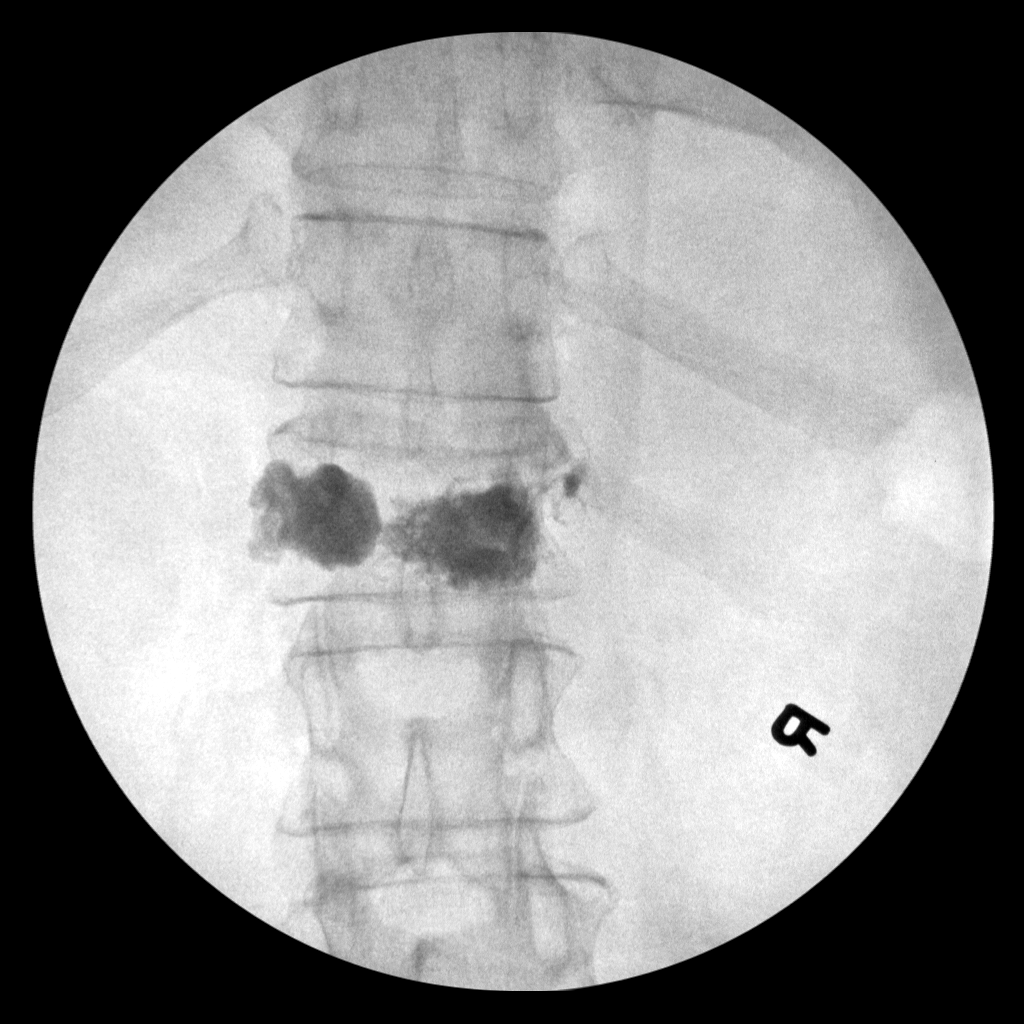

[1 of 1 positions shown; findings below may reference images not displayed]

FINDINGS: Frontal and lateral radiographs of the thoracolumbar spine are
submitted for review. The left rib appears hypoplastic. Cement is
present in both the right and left aspects of the vertebral body
consistent with a bipedicular kyphoplasty. There is a small amount
of radiopaque cement within a right paravertebral vein.
IMPRESSION: T12 bipedicular kyphoplasty.

## 2018-10-13 SURGERY — KYPHOPLASTY
Anesthesia: General | Site: Back

## 2018-10-13 MED ORDER — LACTATED RINGERS IV SOLN
INTRAVENOUS | Status: DC | PRN
  Administered 2018-10-13 (×2): via INTRAVENOUS

## 2018-10-13 MED ORDER — CEFAZOLIN SODIUM-DEXTROSE 2-4 GM/100ML-% IV SOLN
INTRAVENOUS | Status: AC
  Filled 2018-10-13: qty 100

## 2018-10-13 MED ORDER — OXYCODONE-ACETAMINOPHEN 5-325 MG PO TABS
1.0000 | ORAL_TABLET | Freq: Once | ORAL | Status: AC
  Administered 2018-10-13: 1 via ORAL

## 2018-10-13 MED ORDER — IOPAMIDOL (ISOVUE-300) INJECTION 61%
INTRAVENOUS | Status: DC | PRN
  Administered 2018-10-13: 50 mL

## 2018-10-13 MED ORDER — BACITRACIN 500 UNIT/GM EX OINT
TOPICAL_OINTMENT | CUTANEOUS | Status: DC | PRN
  Administered 2018-10-13: 1 via TOPICAL

## 2018-10-13 MED ORDER — BUPIVACAINE-EPINEPHRINE (PF) 0.25% -1:200000 IJ SOLN
INTRAMUSCULAR | Status: AC
  Filled 2018-10-13: qty 30

## 2018-10-13 MED ORDER — MIDAZOLAM HCL 2 MG/2ML IJ SOLN
INTRAMUSCULAR | Status: AC
  Filled 2018-10-13: qty 2

## 2018-10-13 MED ORDER — ROCURONIUM BROMIDE 50 MG/5ML IV SOSY
PREFILLED_SYRINGE | INTRAVENOUS | Status: DC | PRN
  Administered 2018-10-13: 50 mg via INTRAVENOUS

## 2018-10-13 MED ORDER — IOPAMIDOL (ISOVUE-300) INJECTION 61%
INTRAVENOUS | Status: AC
  Filled 2018-10-13: qty 50

## 2018-10-13 MED ORDER — ONDANSETRON HCL 4 MG/2ML IJ SOLN
INTRAMUSCULAR | Status: DC | PRN
  Administered 2018-10-13: 4 mg via INTRAVENOUS

## 2018-10-13 MED ORDER — HYDROMORPHONE HCL 1 MG/ML IJ SOLN
INTRAMUSCULAR | Status: AC
  Administered 2018-10-13: 0.25 mg via INTRAVENOUS
  Filled 2018-10-13: qty 1

## 2018-10-13 MED ORDER — HYDROMORPHONE HCL 1 MG/ML IJ SOLN
0.2500 mg | INTRAMUSCULAR | Status: DC | PRN
  Administered 2018-10-13: 0.25 mg via INTRAVENOUS

## 2018-10-13 MED ORDER — DEXAMETHASONE SODIUM PHOSPHATE 10 MG/ML IJ SOLN
INTRAMUSCULAR | Status: DC | PRN
  Administered 2018-10-13: 10 mg via INTRAVENOUS

## 2018-10-13 MED ORDER — FENTANYL CITRATE (PF) 250 MCG/5ML IJ SOLN
INTRAMUSCULAR | Status: AC
  Filled 2018-10-13: qty 5

## 2018-10-13 MED ORDER — SUGAMMADEX SODIUM 200 MG/2ML IV SOLN
INTRAVENOUS | Status: DC | PRN
  Administered 2018-10-13: 200 mg via INTRAVENOUS

## 2018-10-13 MED ORDER — MIDAZOLAM HCL 5 MG/5ML IJ SOLN
INTRAMUSCULAR | Status: DC | PRN
  Administered 2018-10-13: 2 mg via INTRAVENOUS

## 2018-10-13 MED ORDER — 0.9 % SODIUM CHLORIDE (POUR BTL) OPTIME
TOPICAL | Status: DC | PRN
  Administered 2018-10-13: 1000 mL

## 2018-10-13 MED ORDER — OXYCODONE-ACETAMINOPHEN 5-325 MG PO TABS
ORAL_TABLET | ORAL | Status: AC
  Filled 2018-10-13: qty 1

## 2018-10-13 MED ORDER — BACITRACIN ZINC 500 UNIT/GM EX OINT
TOPICAL_OINTMENT | CUTANEOUS | Status: AC
  Filled 2018-10-13: qty 28.35

## 2018-10-13 MED ORDER — FENTANYL CITRATE (PF) 100 MCG/2ML IJ SOLN
INTRAMUSCULAR | Status: DC | PRN
  Administered 2018-10-13 (×2): 50 ug via INTRAVENOUS
  Administered 2018-10-13: 150 ug via INTRAVENOUS

## 2018-10-13 MED ORDER — LIDOCAINE 2% (20 MG/ML) 5 ML SYRINGE
INTRAMUSCULAR | Status: DC | PRN
  Administered 2018-10-13: 100 mg via INTRAVENOUS

## 2018-10-13 MED ORDER — CEFAZOLIN SODIUM-DEXTROSE 2-3 GM-%(50ML) IV SOLR
INTRAVENOUS | Status: DC | PRN
  Administered 2018-10-13: 2 g via INTRAVENOUS

## 2018-10-13 MED ORDER — PROPOFOL 10 MG/ML IV BOLUS
INTRAVENOUS | Status: DC | PRN
  Administered 2018-10-13: 200 mg via INTRAVENOUS

## 2018-10-13 SURGICAL SUPPLY — 49 items
BLADE SURG 15 STRL LF DISP TIS (BLADE) ×1 IMPLANT
BLADE SURG 15 STRL SS (BLADE) ×2
CEMENT BONE KYPHX HV R (Orthopedic Implant) ×1 IMPLANT
CEMENT KYPHON C01A KIT/MIXER (Cement) ×1 IMPLANT
COVER MAYO STAND STRL (DRAPES) ×2 IMPLANT
COVER SURGICAL LIGHT HANDLE (MISCELLANEOUS) ×2 IMPLANT
COVER WAND RF STERILE (DRAPES) ×2 IMPLANT
CURETTE EXPRESS SZ2 7MM (INSTRUMENTS) IMPLANT
CURRETTE EXPRESS SZ2 7MM (INSTRUMENTS)
DRAPE C-ARM 42X72 X-RAY (DRAPES) ×2 IMPLANT
DRAPE HALF SHEET 40X57 (DRAPES) IMPLANT
DRAPE INCISE IOBAN 66X45 STRL (DRAPES) ×2 IMPLANT
DRAPE LAPAROTOMY T 102X78X121 (DRAPES) ×2 IMPLANT
DRAPE SURG 17X23 STRL (DRAPES) ×8 IMPLANT
DRAPE WARM FLUID 44X44 (DRAPE) ×2 IMPLANT
DURAPREP 26ML APPLICATOR (WOUND CARE) ×2 IMPLANT
GAUZE 4X4 16PLY RFD (DISPOSABLE) ×2 IMPLANT
GAUZE SPONGE 2X2 8PLY STRL LF (GAUZE/BANDAGES/DRESSINGS) ×1 IMPLANT
GAUZE SPONGE 4X4 12PLY STRL (GAUZE/BANDAGES/DRESSINGS) ×1 IMPLANT
GLOVE BIO SURGEON STRL SZ7 (GLOVE) ×2 IMPLANT
GLOVE BIO SURGEON STRL SZ8 (GLOVE) ×2 IMPLANT
GLOVE BIOGEL PI IND STRL 7.0 (GLOVE) ×1 IMPLANT
GLOVE BIOGEL PI IND STRL 8 (GLOVE) ×1 IMPLANT
GLOVE BIOGEL PI INDICATOR 7.0 (GLOVE) ×1
GLOVE BIOGEL PI INDICATOR 8 (GLOVE) ×1
GOWN STRL REUS W/ TWL LRG LVL3 (GOWN DISPOSABLE) ×2 IMPLANT
GOWN STRL REUS W/ TWL XL LVL3 (GOWN DISPOSABLE) ×1 IMPLANT
GOWN STRL REUS W/TWL LRG LVL3 (GOWN DISPOSABLE) ×4
GOWN STRL REUS W/TWL XL LVL3 (GOWN DISPOSABLE) ×2
KIT BASIN OR (CUSTOM PROCEDURE TRAY) ×2 IMPLANT
KIT TURNOVER KIT B (KITS) ×2 IMPLANT
NDL HYPO 25X1 1.5 SAFETY (NEEDLE) ×1 IMPLANT
NDL SPNL 18GX3.5 QUINCKE PK (NEEDLE) ×2 IMPLANT
NEEDLE 22X1 1/2 (OR ONLY) (NEEDLE) IMPLANT
NEEDLE HYPO 25X1 1.5 SAFETY (NEEDLE) ×2 IMPLANT
NEEDLE SPNL 18GX3.5 QUINCKE PK (NEEDLE) ×4 IMPLANT
NS IRRIG 1000ML POUR BTL (IV SOLUTION) ×2 IMPLANT
PACK UNIVERSAL I (CUSTOM PROCEDURE TRAY) ×2 IMPLANT
PAD ARMBOARD 7.5X6 YLW CONV (MISCELLANEOUS) ×4 IMPLANT
POSITIONER HEAD PRONE TRACH (MISCELLANEOUS) ×2 IMPLANT
SPONGE GAUZE 2X2 STER 10/PKG (GAUZE/BANDAGES/DRESSINGS) ×1
SUT MNCRL AB 4-0 PS2 18 (SUTURE) ×2 IMPLANT
SYR BULB IRRIGATION 50ML (SYRINGE) ×2 IMPLANT
SYR CONTROL 10ML LL (SYRINGE) ×2 IMPLANT
TAPE CLOTH SURG 4X10 WHT LF (GAUZE/BANDAGES/DRESSINGS) ×1 IMPLANT
TOWEL GREEN STERILE (TOWEL DISPOSABLE) ×2 IMPLANT
TOWEL GREEN STERILE FF (TOWEL DISPOSABLE) ×2 IMPLANT
TRAY KYPHOPAK 15/3 ONESTEP 1ST (MISCELLANEOUS) ×1 IMPLANT
TRAY KYPHOPAK 20/3 ONESTEP 1ST (MISCELLANEOUS) IMPLANT

## 2018-10-13 NOTE — H&P (Signed)
PREOPERATIVE H&P  Chief Complaint: Back pain  HPI: Rhonda Espinoza is a 58 y.o. female who presents with ongoing pain in the mid-back  MRI reveals a T12 compression fracture  Patient has failed multiple forms of conservative care and continues to have pain (see office notes for additional details regarding the patient's full course of treatment)  Past Medical History:  Diagnosis Date  . ADD (attention deficit disorder)   . Anxiety   . Arthritis   . Asthma    allergy induced per patient  . Disc disorder   . Hyperlipidemia   . Hypertension   . Pneumonia    Past Surgical History:  Procedure Laterality Date  . CESAREAN SECTION    . FOOT SURGERY Left   . HERNIA REPAIR    . KNEE ARTHROSCOPY     Social History   Socioeconomic History  . Marital status: Married    Spouse name: Not on file  . Number of children: Not on file  . Years of education: Not on file  . Highest education level: Not on file  Occupational History  . Not on file  Social Needs  . Financial resource strain: Not on file  . Food insecurity:    Worry: Not on file    Inability: Not on file  . Transportation needs:    Medical: Not on file    Non-medical: Not on file  Tobacco Use  . Smoking status: Never Smoker  . Smokeless tobacco: Never Used  Substance and Sexual Activity  . Alcohol use: Yes    Comment: occasional  . Drug use: Never  . Sexual activity: Not on file  Lifestyle  . Physical activity:    Days per week: Not on file    Minutes per session: Not on file  . Stress: Not on file  Relationships  . Social connections:    Talks on phone: Not on file    Gets together: Not on file    Attends religious service: Not on file    Active member of club or organization: Not on file    Attends meetings of clubs or organizations: Not on file    Relationship status: Not on file  Other Topics Concern  . Not on file  Social History Narrative  . Not on file   No family history on  file. Allergies  Allergen Reactions  . Latex Other (See Comments)    "more like bandages" CAUSES BLISTERS   Prior to Admission medications   Medication Sig Start Date End Date Taking? Authorizing Provider  acetaminophen (TYLENOL) 325 MG tablet Take 650 mg by mouth every 6 (six) hours as needed.   Yes [provider]  albuterol (PROVENTIL HFA;VENTOLIN HFA) 108 (90 Base) MCG/ACT inhaler Inhale 2 puffs into the lungs every 6 (six) hours as needed for wheezing or shortness of breath.   Yes [provider]  ALPRAZolam [name] 0.5 MG tablet Take 0.5 mg by mouth 3 (three) times daily as needed for anxiety.  07/09/16  Yes [provider]  aspirin 81 MG EC tablet Take 81 mg by mouth daily.    Yes [provider]  atorvastatin (LIPITOR) 40 MG tablet Take 40 mg by mouth daily.  06/12/16  Yes [provider]  buPROPion (WELLBUTRIN XL) 150 MG 24 hr tablet Take 150 mg by mouth daily.   Yes [provider]  diazepam (VALIUM) 5 MG tablet Take 1 tablet (5 mg total) by mouth as needed for  up to 2 doses for anxiety (Take one tab 45 minutes before MRI. Take second tablet just prior to MRI scan). Do not take medication within 4 hours of taking opioid pain medications. Must have a driver. Do not drive or operate machinery x 24 hours after taking this medication. 08/05/18  Yes Milinda Pointer, MD  fenofibrate 160 MG tablet Take 160 mg by mouth daily.  06/10/16  Yes [provider]  fluticasone (FLONASE) 50 MCG/ACT nasal spray Place 2 sprays into both nostrils daily as needed for allergies.  09/23/18  Yes [provider]  Menthol, Topical Analgesic, (BIOFREEZE EX) Apply 1 application topically daily as needed (aches and pains).   Yes [provider]  valsartan-hydrochlorothiazide (DIOVAN-HCT) 160-25 MG tablet Take 1 tablet by mouth daily.   Yes [provider]  Vitamin D, Ergocalciferol, (DRISDOL) 50000 units CAPS capsule Take  50,000 Units by mouth every 7 (seven) days.  03/10/16  Yes [provider]  zolpidem (AMBIEN CR) 12.5 MG CR tablet Take 12.5 mg by mouth at bedtime as needed for sleep.  07/16/16  Yes [provider]  lisinopril-hydrochlorothiazide (PRINZIDE,ZESTORETIC) 20-25 MG tablet Take 1 tablet by mouth daily.  05/22/16 05/22/17  [provider]     All other systems have been reviewed and were otherwise negative with the exception of those mentioned in the HPI and as above.  Physical Exam: There were no vitals filed for this visit.  There is no height or weight on file to calculate BMI.  General: Alert, no acute distress Cardiovascular: No pedal edema Respiratory: No cyanosis, no use of accessory musculature Skin: No lesions in the area of chief complaint Neurologic: Sensation intact distally Psychiatric: Patient is competent for consent with normal mood and affect Lymphatic: No axillary or cervical lymphadenopathy  MUSCULOSKELETAL: + TTP at mid-back  Assessment/Plan: T12 COMPRESSION FRACTURE  Plan for Procedure(s): THORACIC 12 KYPHOPLASTY   [name], MD 10/13/2018 8:29 AM

## 2018-10-13 NOTE — Anesthesia Procedure Notes (Signed)
Procedure Name: Intubation Date/Time: 10/13/2018 6:20 PM Performed by: Inda Coke, CRNA Pre-anesthesia Checklist: Patient identified, Emergency Drugs available, Suction available and Patient being monitored Patient Re-evaluated:Patient Re-evaluated prior to induction Oxygen Delivery Method: Circle System Utilized Preoxygenation: Pre-oxygenation with 100% oxygen Induction Type: IV induction Ventilation: Mask ventilation without difficulty Laryngoscope Size: Mac and 3 Grade View: Grade I Tube type: Oral Tube size: 7.0 mm Number of attempts: 1 Airway Equipment and Method: Stylet and Oral airway Placement Confirmation: ETT inserted through vocal cords under direct vision,  positive ETCO2 and breath sounds checked- equal and bilateral Secured at: 21 cm Tube secured with: Tape Dental Injury: Teeth and Oropharynx as per pre-operative assessment

## 2018-10-13 NOTE — Transfer of Care (Signed)
Immediate Anesthesia Transfer of Care Note  Patient: Rhonda Espinoza  Procedure(s) Performed: THORACIC 12 KYPHOPLASTY (N/A Back)  Patient Location: PACU  Anesthesia Type:General  Level of Consciousness: awake, alert  and oriented  Airway & Oxygen Therapy: Patient connected to face mask oxygen  Post-op Assessment: Report given to RN, Post -op Vital signs reviewed and stable and Patient moving all extremities X 4  Post vital signs: Reviewed and stable  Last Vitals:  Vitals Value Taken Time  BP 133/66 10/13/2018  7:23 PM  Temp    Pulse 79 10/13/2018  7:23 PM  Resp 15 10/13/2018  7:23 PM  SpO2 100 % 10/13/2018  7:23 PM  Vitals shown include unvalidated device data.  Last Pain:  Vitals:   10/13/18 1300  TempSrc:   PainSc: 7       Patients Stated Pain Goal: 0 (96/78/93 8101)  Complications: No apparent anesthesia complications

## 2018-10-13 NOTE — Op Note (Signed)
NAME:  Rhonda Espinoza              MEDICAL RECORD NO.:  [id number]  PHYSICIAN:  Phylliss Bob, MD           DATE OF BIRTH: 1961-08-19  DATE OF PROCEDURE:  10/13/2018                              OPERATIVE REPORT   PREOPERATIVE DIAGNOSIS:  T12 compression fracture.  POSTOPERATIVE DIAGNOSIS:  T12 compression fracture.  PROCEDURE:  T12 kyphoplasty.  SURGEON:  Phylliss Bob, MD.  ASSISTANT:  None  ANESTHESIA:  General endotracheal anesthesia.  COMPLICATIONS:  None.  DISPOSITION:  Stable.   ESTIMATED BLOOD LOSS:  Minimal.  INDICATIONS FOR SURGERY:  Briefly, Rhonda Espinoza is a very pleasant 58- year-old female, who did have an onset of pain in her low back about 4 months ago.   Her pain was rather severe.  The patient's imaging studies did clearly reveal a T12 compression fracture. The patient continued to feel rather debilitated as a result of her ongoing Pain, despite appropriate nonoperative meansures.  Given her ongoing pain and dysfunction, we did discuss proceeding with the procedure noted above.  I did fully discuss the procedure with the patient, and she did wish to proceed.  OPERATIVE DETAILS:  On 10/13/2018, the patient was brought to surgery and general endotracheal anesthesia was administered.  The patient was placed prone on a well-padded flat Jackson bed with gel rolls placed under the patient's chest and hips.  Antibiotics were given.  AP and lateral fluoroscopy was brought into the field.  The T12 pedicles were marked out in the usual fashion.  After a time-out procedure was performed, I did advance Jamshidis across the T12 pedicles on the right and left sides.  I then drilled through the Jamshidis.  I then inserted kyphoplasty balloons and I was able to inflate the balloons with approximately 3cc of contrast.  Partial restoration of the superior endplate was noted.  At this point, after the cement was mixed, a total of approximately 6cc of cement was injected, half on  the right, and half on the left.  Excellent interdigitation of cement was Identified on the right. The cement did appear to slightly breach the left lateral vertebral cortex after about 3cc of cement was injected.  I elected to not inject any additional cement on the left side as soon as  this was noted. There was no extravasation of cement posteriorly toward  the spinal canal or anteriorly. The cement was then allowed to harden, after which point the Jamshidis were removed.  The wound  was then irrigated and closed using 4-0 Monocryl.  Bacitracin and a sterile dressing were applied.   The patient was then awoken from general endotracheal anesthesia and transferred to Recovery in stable condition.   Phylliss Bob, MD

## 2018-10-13 NOTE — Anesthesia Preprocedure Evaluation (Addendum)
Anesthesia Evaluation  Patient identified by MRN, date of birth, ID band Patient awake    Reviewed: Allergy & Precautions, NPO status , Patient's Chart, lab work & pertinent test results  Airway Mallampati: I  TM Distance: >3 FB Neck ROM: Full    Dental no notable dental hx. (+) Teeth Intact, Dental Advisory Given   Pulmonary asthma ,    Pulmonary exam normal breath sounds clear to auscultation       Cardiovascular hypertension, negative cardio ROS Normal cardiovascular exam Rhythm:Regular Rate:Normal     Neuro/Psych PSYCHIATRIC DISORDERS Anxiety negative neurological ROS     GI/Hepatic negative GI ROS, Neg liver ROS,   Endo/Other  negative endocrine ROS  Renal/GU negative Renal ROS  negative genitourinary   Musculoskeletal  (+) Arthritis , Osteoarthritis,    Abdominal   Peds  Hematology negative hematology ROS (+)   Anesthesia Other Findings   Reproductive/Obstetrics                          Anesthesia Physical Anesthesia Plan  ASA: II  Anesthesia Plan: General   Post-op Pain Management:    Induction: Intravenous  PONV Risk Score and Plan: 3 and Midazolam, Dexamethasone and Ondansetron  Airway Management Planned: Oral ETT  Additional Equipment: None  Intra-op Plan:   Post-operative Plan: Extubation in OR  Informed Consent: I have reviewed the patients History and Physical, chart, labs and discussed the procedure including the risks, benefits and alternatives for the proposed anesthesia with the patient or authorized representative who has indicated his/her understanding and acceptance.     Dental advisory given  Plan Discussed with: CRNA  Anesthesia Plan Comments:        Anesthesia Quick Evaluation

## 2018-10-13 NOTE — Anesthesia Postprocedure Evaluation (Signed)
Anesthesia Post Note  Patient: Rhonda Espinoza  Procedure(s) Performed: THORACIC 12 KYPHOPLASTY (N/A Back)     Patient location during evaluation: PACU Anesthesia Type: General Level of consciousness: awake and alert Pain management: pain level controlled Vital Signs Assessment: post-procedure vital signs reviewed and stable Respiratory status: spontaneous breathing, nonlabored ventilation and respiratory function stable Cardiovascular status: blood pressure returned to baseline and stable Postop Assessment: no apparent nausea or vomiting Anesthetic complications: no    Last Vitals:  Vitals:   10/13/18 1940 10/13/18 2025  BP: (!) 156/89   Pulse: 78   Resp: 13   Temp:  36.6 C  SpO2: 97%     Last Pain:  Vitals:   10/13/18 2025  TempSrc:   PainSc: [name]

## 2018-10-15 ENCOUNTER — Encounter (HOSPITAL_COMMUNITY): Payer: Self-pay | Admitting: Orthopedic Surgery

## 2018-11-02 ENCOUNTER — Ambulatory Visit: Payer: Managed Care, Other (non HMO) | Admitting: Pain Medicine

## 2018-11-02 ENCOUNTER — Other Ambulatory Visit: Payer: Self-pay

## 2018-11-02 ENCOUNTER — Encounter: Payer: Self-pay | Admitting: Pain Medicine

## 2018-11-02 ENCOUNTER — Ambulatory Visit
Admission: RE | Admit: 2018-11-02 | Discharge: 2018-11-02 | Disposition: A | Discharge status: Home / Self Care | Payer: Managed Care, Other (non HMO) | Source: Ambulatory Visit | Attending: Pain Medicine | Admitting: Pain Medicine

## 2018-11-02 VITALS — BP 104/70 | HR 93 | Temp 98.3°F | Resp 10 | Ht 68.0 in | Wt 220.0 lb

## 2018-11-02 DIAGNOSIS — M4310 Spondylolisthesis, site unspecified: Secondary | ICD-10-CM | POA: Insufficient documentation

## 2018-11-02 DIAGNOSIS — Z91040 Latex allergy status: Secondary | ICD-10-CM | POA: Diagnosis present

## 2018-11-02 DIAGNOSIS — M5136 Other intervertebral disc degeneration, lumbar region: Secondary | ICD-10-CM | POA: Insufficient documentation

## 2018-11-02 DIAGNOSIS — M545 Low back pain, unspecified: Secondary | ICD-10-CM

## 2018-11-02 DIAGNOSIS — M48061 Spinal stenosis, lumbar region without neurogenic claudication: Secondary | ICD-10-CM

## 2018-11-02 DIAGNOSIS — G8929 Other chronic pain: Secondary | ICD-10-CM | POA: Insufficient documentation

## 2018-11-02 DIAGNOSIS — S22080S Wedge compression fracture of T11-T12 vertebra, sequela: Secondary | ICD-10-CM | POA: Insufficient documentation

## 2018-11-02 DIAGNOSIS — M51369 Other intervertebral disc degeneration, lumbar region without mention of lumbar back pain or lower extremity pain: Secondary | ICD-10-CM

## 2018-11-02 MED ORDER — MIDAZOLAM HCL 5 MG/5ML IJ SOLN
INTRAMUSCULAR | Status: AC
  Filled 2018-11-02: qty 5

## 2018-11-02 MED ORDER — ROPIVACAINE HCL 2 MG/ML IJ SOLN
INTRAMUSCULAR | Status: AC
  Filled 2018-11-02: qty 10

## 2018-11-02 MED ORDER — DEXAMETHASONE SODIUM PHOSPHATE 10 MG/ML IJ SOLN: 10.0000 mg | Freq: Once | INTRAMUSCULAR | Status: DC

## 2018-11-02 MED ORDER — LIDOCAINE HCL 2 % IJ SOLN
INTRAMUSCULAR | Status: AC
  Filled 2018-11-02: qty 20

## 2018-11-02 MED ORDER — FENTANYL CITRATE (PF) 100 MCG/2ML IJ SOLN
25.0000 ug | INTRAMUSCULAR | Status: DC | PRN
  Administered 2018-11-02: 100 ug via INTRAVENOUS

## 2018-11-02 MED ORDER — IOPAMIDOL (ISOVUE-M 200) INJECTION 41%
10.0000 mL | Freq: Once | INTRAMUSCULAR | Status: AC
  Administered 2018-11-02: 10 mL via EPIDURAL
  Filled 2018-11-02: qty 10

## 2018-11-02 MED ORDER — SODIUM CHLORIDE 0.9% FLUSH
2.0000 mL | Freq: Once | INTRAVENOUS | Status: AC
  Administered 2018-11-02: 2 mL

## 2018-11-02 MED ORDER — ROPIVACAINE HCL 2 MG/ML IJ SOLN
2.0000 mL | Freq: Once | INTRAMUSCULAR | Status: AC
  Administered 2018-11-02: 2 mL via EPIDURAL

## 2018-11-02 MED ORDER — FENTANYL CITRATE (PF) 100 MCG/2ML IJ SOLN
INTRAMUSCULAR | Status: AC
  Filled 2018-11-02: qty 2

## 2018-11-02 MED ORDER — LIDOCAINE HCL 2 % IJ SOLN
20.0000 mL | Freq: Once | INTRAMUSCULAR | Status: AC
  Administered 2018-11-02: 400 mg

## 2018-11-02 MED ORDER — DEXAMETHASONE SODIUM PHOSPHATE 10 MG/ML IJ SOLN
INTRAMUSCULAR | Status: AC
  Filled 2018-11-02: qty 1

## 2018-11-02 MED ORDER — MIDAZOLAM HCL 5 MG/5ML IJ SOLN
1.0000 mg | INTRAMUSCULAR | Status: DC | PRN
  Administered 2018-11-02: 3 mg via INTRAVENOUS

## 2018-11-02 MED ORDER — SODIUM CHLORIDE (PF) 0.9 % IJ SOLN
INTRAMUSCULAR | Status: AC
  Filled 2018-11-02: qty 10

## 2018-11-02 MED ORDER — TRIAMCINOLONE ACETONIDE 40 MG/ML IJ SUSP
INTRAMUSCULAR | Status: AC
  Filled 2018-11-02: qty 1

## 2018-11-02 MED ORDER — LACTATED RINGERS IV SOLN
1000.0000 mL | Freq: Once | INTRAVENOUS | Status: AC
  Administered 2018-11-02: 1000 mL via INTRAVENOUS

## 2018-11-02 MED ORDER — TRIAMCINOLONE ACETONIDE 40 MG/ML IJ SUSP
40.0000 mg | Freq: Once | INTRAMUSCULAR | Status: AC
  Administered 2018-11-02: 40 mg

## 2018-11-02 NOTE — Progress Notes (Signed)
Patient's Name: Rhonda Espinoza  MRN: [id number]  Referring Provider: Chesley Noon, MD  DOB: 1961-02-19  PCP: Chesley Noon, MD  DOS: 11/02/2018  Note by: Gaspar Cola, MD  Service setting: Ambulatory outpatient  Specialty: Interventional Pain Management  Patient type: Established  Location: [facility] (AMB) Pain Management Facility  Visit type: Interventional Procedure   Primary Reason for Visit: Interventional Pain Management Treatment. CC: Back Pain  Procedure:          Anesthesia, Analgesia, Anxiolysis:  Type: Therapeutic Inter-Laminar Epidural Steroid Injection  #2  Region: Lumbar Level: L2-3 Level. Laterality: Right-Sided Paramedial  Type: Moderate (Conscious) Sedation combined with Local Anesthesia Indication(s): Analgesia and Anxiety Route: Intravenous (IV) IV Access: Secured Sedation: Meaningful verbal contact was maintained at all times during the procedure  Local Anesthetic: Lidocaine 1-2%  Position: Prone with head of the table was raised to facilitate breathing.   Indications: 1. DDD (degenerative disc disease), lumbar   2. Chronic low back pain (Bilateral) (R>L)   3. Traumatic compression fracture of T12 (<15%) thoracic vertebra, sequela   4. Lumbar central spinal stenosis   5. Lumbar foraminal stenosis (Left: L2-3, L3-4) (Bilateral: L4-5, L5-S1)   6. Lumbar Grade 1 Retrolisthesis of L2/L3, L3/L4, & L4/L5   7. Lumbar lateral recess stenosis (Left: L2-3) (Bilateral: L3-4) (Right: L4-5)    Pain Score: Pre-procedure: 5 /10 Post-procedure: 0-No pain/10  Pre-op Assessment:  Rhonda Espinoza is a 58 y.o. (year old), female patient, seen today for interventional treatment. She  has a past surgical history that includes Knee arthroscopy; Cesarean section; Hernia repair; Foot surgery (Left); and Kyphoplasty (N/A, 10/13/2018). Rhonda Espinoza has a current medication list which includes the following prescription(s): acetaminophen, albuterol, alprazolam, aspirin, atorvastatin,  bupropion, diazepam, fenofibrate, fluticasone, menthol (topical analgesic), valsartan-hydrochlorothiazide, vitamin d (ergocalciferol), zolpidem, and lisinopril-hydrochlorothiazide, and the following Facility-Administered Medications: dexamethasone, fentanyl, and midazolam. Her primarily concern today is the Back Pain  Initial Vital Signs:  Pulse/HCG Rate: 93ECG Heart Rate: 91 Temp: 97.9 F (36.6 C) Resp: 14 BP: 132/74 SpO2: 100 %  BMI: Estimated body mass index is 33.45 kg/m as calculated from the following:   Height as of this encounter: 5\' 8"  (1.727 m).   Weight as of this encounter: 220 lb (99.8 kg).  Risk Assessment: Allergies: Reviewed. She is allergic to latex.  Allergy Precautions: Latex-free protocol activated Coagulopathies: Reviewed. None identified.  Blood-thinner therapy: None at this time Active Infection(s): Reviewed. None identified. Rhonda Espinoza is afebrile  Site Confirmation: Rhonda Espinoza was asked to confirm the procedure and laterality before marking the site Procedure checklist: Completed Consent: Before the procedure and under the influence of no sedative(s), amnesic(s), or anxiolytics, the patient was informed of the treatment options, risks and possible complications. To fulfill our ethical and legal obligations, as recommended by the American Medical Association's Code of Ethics, I have informed the patient of my clinical impression; the nature and purpose of the treatment or procedure; the risks, benefits, and possible complications of the intervention; the alternatives, including doing nothing; the risk(s) and benefit(s) of the alternative treatment(s) or procedure(s); and the risk(s) and benefit(s) of doing nothing. The patient was provided information about the general risks and possible complications associated with the procedure. These may include, but are not limited to: failure to achieve desired goals, infection, bleeding, organ or nerve damage, allergic reactions,  paralysis, and death. In addition, the patient was informed of those risks and complications associated to Spine-related procedures, such as failure to decrease pain; infection (  i.e.: Meningitis, epidural or intraspinal abscess); bleeding (i.e.: epidural hematoma, subarachnoid hemorrhage, or any other type of intraspinal or peri-dural bleeding); organ or nerve damage (i.e.: Any type of peripheral nerve, nerve root, or spinal cord injury) with subsequent damage to sensory, motor, and/or autonomic systems, resulting in permanent pain, numbness, and/or weakness of one or several areas of the body; allergic reactions; (i.e.: anaphylactic reaction); and/or death. Furthermore, the patient was informed of those risks and complications associated with the medications. These include, but are not limited to: allergic reactions (i.e.: anaphylactic or anaphylactoid reaction(s)); adrenal axis suppression; blood sugar elevation that in diabetics may result in ketoacidosis or comma; water retention that in patients with history of congestive heart failure may result in shortness of breath, pulmonary edema, and decompensation with resultant heart failure; weight gain; swelling or edema; medication-induced neural toxicity; particulate matter embolism and blood vessel occlusion with resultant organ, and/or nervous system infarction; and/or aseptic necrosis of one or more joints. Finally, the patient was informed that Medicine is not an exact science; therefore, there is also the possibility of unforeseen or unpredictable risks and/or possible complications that may result in a catastrophic outcome. The patient indicated having understood very clearly. We have given the patient no guarantees and we have made no promises. Enough time was given to the patient to ask questions, all of which were answered to the patient's satisfaction. Rhonda Espinoza has indicated that she wanted to continue with the procedure. Attestation: I, the ordering  provider, attest that I have discussed with the patient the benefits, risks, side-effects, alternatives, likelihood of achieving goals, and potential problems during recovery for the procedure that I have provided informed consent. Date  Time: 11/02/2018  1:44 PM  Pre-Procedure Preparation:  Monitoring: As per clinic protocol. Respiration, ETCO2, SpO2, BP, heart rate and rhythm monitor placed and checked for adequate function Safety Precautions: Patient was assessed for positional comfort and pressure points before starting the procedure. Time-out: I initiated and conducted the "Time-out" before starting the procedure, as per protocol. The patient was asked to participate by confirming the accuracy of the "Time Out" information. Verification of the correct person, site, and procedure were performed and confirmed by me, the nursing staff, and the patient. "Time-out" conducted as per Joint Commission's Universal Protocol (UP.01.01.01). Time: 1428  Description of Procedure:          Target Area: The interlaminar space, initially targeting the lower laminar border of the superior vertebral body. Approach: Paramedial approach. Area Prepped: Entire Posterior Lumbar Region Prepping solution: ChloraPrep (2% chlorhexidine gluconate and 70% isopropyl alcohol) Safety Precautions: Aspiration looking for blood return was conducted prior to all injections. At no point did we inject any substances, as a needle was being advanced. No attempts were made at seeking any paresthesias. Safe injection practices and needle disposal techniques used. Medications properly checked for expiration dates. SDV (single dose vial) medications used. Description of the Procedure: Protocol guidelines were followed. The procedure needle was introduced through the skin, ipsilateral to the reported pain, and advanced to the target area. Bone was contacted and the needle walked caudad, until the lamina was cleared. The epidural space was  identified using "loss-of-resistance technique" with 2-3 ml of PF-NaCl (0.9% NSS), in a 5cc LOR glass syringe.  Vitals:   11/02/18 1434 11/02/18 1444 11/02/18 1454 11/02/18 1504  BP: (!) 124/57 (!) 147/72 129/74 104/70  Pulse:      Resp: 14 11 (!) 9 10  Temp:  (!) 97.2 F (36.2 C)  98.3 F (36.8 C)  SpO2: 94% 98% 99% 100%  Weight:      Height:        Start Time: 1428 hrs. End Time: 1434 hrs.  Materials:  Needle(s) Type: Epidural needle Gauge: 17G Length: 3.5-in Medication(s): Please see orders for medications and dosing details.  Imaging Guidance (Spinal):          Type of Imaging Technique: Fluoroscopy Guidance (Spinal) Indication(s): Assistance in needle guidance and placement for procedures requiring needle placement in or near specific anatomical locations not easily accessible without such assistance. Exposure Time: Please see nurses notes. Contrast: Before injecting any contrast, we confirmed that the patient did not have an allergy to iodine, shellfish, or radiological contrast. Once satisfactory needle placement was completed at the desired level, radiological contrast was injected. Contrast injected under live fluoroscopy. No contrast complications. See chart for type and volume of contrast used. Fluoroscopic Guidance: I was personally present during the use of fluoroscopy. "Tunnel Vision Technique" used to obtain the best possible view of the target area. Parallax error corrected before commencing the procedure. "Direction-depth-direction" technique used to introduce the needle under continuous pulsed fluoroscopy. Once target was reached, antero-posterior, oblique, and lateral fluoroscopic projection used confirm needle placement in all planes. Images permanently stored in EMR.      Interpretation: I personally interpreted the imaging intraoperatively. Adequate needle placement confirmed in multiple planes. Appropriate spread of contrast into desired area was observed. No  evidence of afferent or efferent intravascular uptake. No intrathecal or subarachnoid spread observed. Permanent images saved into the patient's record.  Antibiotic Prophylaxis:   Anti-infectives (From admission, onward)   None     Indication(s): None identified  Post-operative Assessment:  Post-procedure Vital Signs:  Pulse/HCG Rate: 9379 Temp: 98.3 F (36.8 C) Resp: 10 BP: 104/70 SpO2: 100 %  EBL: None  Complications: No immediate post-treatment complications observed by team, or reported by patient.  Note: The patient tolerated the entire procedure well. A repeat set of vitals were taken after the procedure and the patient was kept under observation following institutional policy, for this type of procedure. Post-procedural neurological assessment was performed, showing return to baseline, prior to discharge. The patient was provided with post-procedure discharge instructions, including a section on how to identify potential problems. Should any problems arise concerning this procedure, the patient was given instructions to immediately contact us, at any time, without hesitation. In any case, we plan to contact the patient by telephone for a follow-up status report regarding this interventional procedure.  Comments:  No additional relevant information.  Plan of Care    Imaging Orders     DG C-Arm 1-60 Min-No Report  Procedure Orders     Lumbar Epidural Injection  Medications ordered for procedure: Meds ordered this encounter  Medications  . iopamidol (ISOVUE-M) 41 % intrathecal injection 10 mL    Must be Myelogram-compatible. If not available, you may substitute with a water-soluble, non-ionic, hypoallergenic, myelogram-compatible radiological contrast medium.  Marland Kitchen lidocaine (XYLOCAINE) 2 % (with pres) injection 400 mg  . midazolam (VERSED) 5 MG/5ML injection 1-2 mg    Make sure Flumazenil is available in the pyxis when using this medication. If oversedation occurs,  administer 0.2 mg IV over 15 sec. If after 45 sec no response, administer 0.2 mg again over 1 min; may repeat at 1 min intervals; not to exceed 4 doses (1 mg)  . fentaNYL (SUBLIMAZE) injection 25-50 mcg    Make sure Narcan is available in the pyxis when  using this medication. In the event of respiratory depression (RR< 8/min): Titrate NARCAN (naloxone) in increments of 0.1 to 0.2 mg IV at 2-3 minute intervals, until desired degree of reversal.  . lactated ringers infusion 1,000 mL  . sodium chloride flush (NS) 0.9 % injection 2 mL  . ropivacaine (PF) 2 mg/mL (0.2%) (NAROPIN) injection 2 mL  . triamcinolone acetonide (KENALOG-40) injection 40 mg  . dexamethasone (DECADRON) injection 10 mg   Medications administered: We administered iopamidol, lidocaine, midazolam, fentaNYL, lactated ringers, sodium chloride flush, ropivacaine (PF) 2 mg/mL (0.2%), and triamcinolone acetonide.  See the medical record for exact dosing, route, and time of administration.  Disposition: Discharge home  Discharge Date & Time: 11/02/2018; 1507 hrs.   Physician-requested Follow-up: Return for post-procedure eval (2 wks), w/ Dr. Dossie Arbour.  Future Appointments  Date Time Provider [place]  11/22/2018  1:45 PM Milinda Pointer, MD [facility] None   Primary Care Physician: Chesley Noon, MD Location: [facility] Note by: Gaspar Cola, MD Date: 11/02/2018; Time: 3:47 PM  Disclaimer:  Medicine is not an Chief Strategy Officer. The only guarantee in medicine is that nothing is guaranteed. It is important to note that the decision to proceed with this intervention was based on the information collected from the patient. The Data and conclusions were drawn from the patient's questionnaire, the interview, and the physical examination. Because the information was provided in large part by the patient, it cannot be guaranteed that it has not been purposely or unconsciously manipulated. Every  effort has been made to obtain as much relevant data as possible for this evaluation. It is important to note that the conclusions that lead to this procedure are derived in large part from the available data. Always take into account that the treatment will also be dependent on availability of resources and existing treatment guidelines, considered by other Pain Management Practitioners as being common knowledge and practice, at the time of the intervention. For Medico-Legal purposes, it is also important to point out that variation in procedural techniques and pharmacological choices are the acceptable norm. The indications, contraindications, technique, and results of the above procedure should only be interpreted and judged by a Board-Certified Interventional Pain Specialist with extensive familiarity and expertise in the same exact procedure and technique.

## 2018-11-02 NOTE — Patient Instructions (Signed)

## 2018-11-03 ENCOUNTER — Telehealth: Payer: Self-pay | Admitting: *Deleted

## 2018-11-03 NOTE — Telephone Encounter (Signed)
Denies complications post procedure. 

## 2018-11-12 ENCOUNTER — Other Ambulatory Visit: Payer: Self-pay | Admitting: Pain Medicine

## 2018-11-12 DIAGNOSIS — M47816 Spondylosis without myelopathy or radiculopathy, lumbar region: Secondary | ICD-10-CM

## 2018-11-12 DIAGNOSIS — M5136 Other intervertebral disc degeneration, lumbar region: Secondary | ICD-10-CM

## 2018-11-12 NOTE — Progress Notes (Unsigned)
The patient contacted me and indicated that she was still having pain in the lower back and therefore for we will go ahead and schedule her to return for a diagnostic bilateral lumbar facet block #1 under fluoroscopic guidance and IV sedation.

## 2018-11-22 ENCOUNTER — Ambulatory Visit: Payer: Managed Care, Other (non HMO) | Admitting: Pain Medicine

## 2018-11-23 ENCOUNTER — Ambulatory Visit
Admission: RE | Admit: 2018-11-23 | Discharge: 2018-11-23 | Disposition: A | Discharge status: Home / Self Care | Payer: Managed Care, Other (non HMO) | Source: Ambulatory Visit | Attending: Pain Medicine | Admitting: Pain Medicine

## 2018-11-23 ENCOUNTER — Ambulatory Visit (HOSPITAL_BASED_OUTPATIENT_CLINIC_OR_DEPARTMENT_OTHER): Payer: Managed Care, Other (non HMO) | Admitting: Pain Medicine

## 2018-11-23 ENCOUNTER — Encounter: Payer: Self-pay | Admitting: Pain Medicine

## 2018-11-23 ENCOUNTER — Other Ambulatory Visit: Payer: Self-pay

## 2018-11-23 VITALS — BP 110/89 | HR 143 | Temp 98.3°F | Resp 17 | Ht 68.0 in | Wt 220.0 lb

## 2018-11-23 DIAGNOSIS — M5136 Other intervertebral disc degeneration, lumbar region: Secondary | ICD-10-CM | POA: Insufficient documentation

## 2018-11-23 DIAGNOSIS — G8929 Other chronic pain: Secondary | ICD-10-CM | POA: Insufficient documentation

## 2018-11-23 DIAGNOSIS — M545 Low back pain: Secondary | ICD-10-CM | POA: Insufficient documentation

## 2018-11-23 DIAGNOSIS — R937 Abnormal findings on diagnostic imaging of other parts of musculoskeletal system: Secondary | ICD-10-CM

## 2018-11-23 DIAGNOSIS — M4310 Spondylolisthesis, site unspecified: Secondary | ICD-10-CM | POA: Diagnosis present

## 2018-11-23 DIAGNOSIS — M47816 Spondylosis without myelopathy or radiculopathy, lumbar region: Secondary | ICD-10-CM | POA: Diagnosis present

## 2018-11-23 MED ORDER — TRIAMCINOLONE ACETONIDE 40 MG/ML IJ SUSP
80.0000 mg | Freq: Once | INTRAMUSCULAR | Status: AC
  Administered 2018-11-23: 80 mg
  Filled 2018-11-23: qty 2

## 2018-11-23 MED ORDER — LACTATED RINGERS IV SOLN
1000.0000 mL | Freq: Once | INTRAVENOUS | Status: AC
  Administered 2018-11-23: 1000 mL via INTRAVENOUS

## 2018-11-23 MED ORDER — FENTANYL CITRATE (PF) 100 MCG/2ML IJ SOLN
25.0000 ug | INTRAMUSCULAR | Status: DC | PRN
  Administered 2018-11-23: 100 ug via INTRAVENOUS
  Filled 2018-11-23: qty 2

## 2018-11-23 MED ORDER — LIDOCAINE HCL 2 % IJ SOLN
20.0000 mL | Freq: Once | INTRAMUSCULAR | Status: AC
  Administered 2018-11-23: 400 mg
  Filled 2018-11-23: qty 40

## 2018-11-23 MED ORDER — MIDAZOLAM HCL 5 MG/5ML IJ SOLN
1.0000 mg | INTRAMUSCULAR | Status: DC | PRN
  Administered 2018-11-23: 3 mg via INTRAVENOUS
  Filled 2018-11-23: qty 5

## 2018-11-23 MED ORDER — ROPIVACAINE HCL 2 MG/ML IJ SOLN
18.0000 mL | Freq: Once | INTRAMUSCULAR | Status: AC
  Administered 2018-11-23: 18 mL via PERINEURAL
  Filled 2018-11-23: qty 20

## 2018-11-23 NOTE — Patient Instructions (Signed)

## 2018-11-23 NOTE — Progress Notes (Signed)
Patient's Name: Rhonda Espinoza  MRN: [id number]  Referring Provider: Milinda Pointer, MD  DOB: [birth year]  PCP: Chesley Noon, MD  DOS: 11/23/2018  Note by: Gaspar Cola, MD  Service setting: Ambulatory outpatient  Specialty: Interventional Pain Management  Patient type: Established  Location: [facility] (AMB) Pain Management Facility  Visit type: Interventional Procedure   Primary Reason for Visit: Interventional Pain Management Treatment. CC: Back Pain  Procedure:          Anesthesia, Analgesia, Anxiolysis:  Type: Lumbar Facet, Medial Branch Block(s) #1  Primary Purpose: Diagnostic Region: Posterolateral Lumbosacral Spine Level: L2, L3, L4, L5, & S1 Medial Branch Level(s). Injecting these levels blocks the L3-4, L4-5, and L5-S1 lumbar facet joints. Laterality: Bilateral  Type: Moderate (Conscious) Sedation combined with Local Anesthesia Indication(s): Analgesia and Anxiety Route: Intravenous (IV) IV Access: Secured Sedation: Meaningful verbal contact was maintained at all times during the procedure  Local Anesthetic: Lidocaine 1-2%  Position: Prone   Indications: 1. Spondylosis without myelopathy or radiculopathy, lumbar region   2. Lumbar facet syndrome (Bilateral) (R>L)   3. Lumbar facet hypertrophy (Multilevel) (Bilateral)   4. Lumbar Grade 1 Retrolisthesis of L2/L3, L3/L4, & L4/L5   5. Grade 1 Lumbar Anterolisthesis of L5/S1   6. Chronic low back pain (Bilateral) (R>L)   7. Abnormal MRI, lumbar spine (08/14/2018)    Pain Score: Pre-procedure: 6 /10 Post-procedure: 0-No pain/10  Lumbar Spine Area Exam  Skin & Axial Inspection: No masses, redness, or swelling Alignment: Symmetrical Functional ROM: Decreased ROM affecting both sides Stability: No instability detected Muscle Tone/Strength: Guarding detected Sensory (Neurological): Movement-associated pain Palpation: Complains of area being tender to palpation       Provocative Tests: Hyperextension/rotation test:  (+) bilaterally for facet joint pain. Lumbar quadrant test (Kemp's test): (+) bilaterally for facet joint pain.  Imaging Review  Lumbosacral Imaging: Lumbar DG 2-3 views:  Results for orders placed during the hospital encounter of 10/13/18  DG Lumbar Spine 2-3 Views   Narrative CLINICAL DATA:  58 year old female undergoing T12 augmentation with kyphoplasty.  EXAM: LUMBAR SPINE - 2-3 VIEW; DG C-ARM 61-120 MIN  COMPARISON:  None.  FINDINGS: Frontal and lateral radiographs of the thoracolumbar spine are submitted for review. The left rib appears hypoplastic. Cement is present in both the right and left aspects of the vertebral body consistent with a bipedicular kyphoplasty. There is a small amount of radiopaque cement within a right paravertebral vein.  IMPRESSION: T12 bipedicular kyphoplasty.   Electronically Signed   By: Jacqulynn Cadet M.D.   On: 10/13/2018 19:23    Lumbar DG Bending views:  Results for orders placed during the hospital encounter of 08/02/18  DG Lumbar Spine Complete W/Bend   Narrative CLINICAL DATA:  58 year old female with pain approximately L1 area for years. No known injury. Initial encounter.  EXAM: LUMBAR SPINE - COMPLETE WITH BENDING VIEWS  COMPARISON:  05/21/2007 CT.  FINDINGS: Scoliosis lumbar spine convex right.  T12 superior endplate mild compression fracture is new compared to 2008 but appears remote with 15% loss of height. No retropulsion.  L1-2 mild disc space narrowing.  L2-3 marked disc space narrowing greatest anteriorly and left lateral position. Mild rotation/retrolisthesis.  L3-4: Marked left-sided and moderate right-sided disc space narrowing. Slight loss of height left lateral aspect of the vertebra.  L4-5 mild to moderate disc space narrowing.  Minimal retrolisthesis.  L5-S1 mild disc space narrowing.  No pars defect detected.  No abnormal motion between flexion and extension.  IMPRESSION: 1.  Scoliosis  lumbar spine convex right with superimposed degenerative changes most notable on the left at the L2-3 and L3-4 level as detailed above. 2. T12 superior endplate compression fracture with 15% loss of height. Although new from 2008 CT, this has an appearance suggesting this is remote in origin. 3. No abnormal motion between flexion and extension.   Electronically Signed   By: Genia Del M.D.   On: 08/02/2018 14:16    MRI LUMBAR SPINE WITHOUT CONTRAST, 08/14/2018 5:35 PM  FINDINGS: Alignment: Dextroconvex fracture. There is minimal stepwise retrolisthesis of L2-L3 through L4-5. There is trace anterolisthesis of L5-S1. Vertebrae: There is notable linear T2/FLAIR signal in the superior T12 endplate with less than 15% vertebral body height loss. There is bowing of the posterior endplate without bony retropulsion. There is facet arthropathy at all levels, worst at L5/S1 on the right. Conus: The conus terminates at level of L1-L2. Normal signal and contour. Degenerative Changes:  T12-L1: Small posterior disc bulge with no significant foraminal spinal canal stenosis. L1-2: Small posterior disc bulge with no significant foraminal spinal canal stenosis. L2-3: Posterior disc bulge and ligamentum flavum thickening results in mild central canal and left lateral recess narrowing. There is mild left foraminal stenosis at this level. L3-4: Posterior disc bulge, facet hypertrophy, ligamentum flavum thickening results in mild central canal narrowing and lateral recess narrowing bilaterally. The posterior disc bulge encephalopathy result in moderate left foraminal stenosis L4-5: Large posterior disc bulge, ligamentum flavum thickening, and facet arthropathy results in mild central canal stenosis. There is narrowing of the lateral recesses displacing the right L5 nerve root. These findings result in advanced right and mild left foraminal stenosis. L5-S1: Posterior disc bulge and ligamentum flavum thickening  results in mild central canal stenosis. There is moderate right and mild left foraminal stenosis.  Upper sacrum/ilium: No significant focal abnormality. Approximately 1.6 cm T1/T2 hyperintense lesion in the upper pole of the right kidney.  CONCLUSION:  1.  Findings consistent with an acute/subacute superior endplate compression fracture of T12 with less than 15% vertebral body height loss and no no substantial bony retropulsion. 2.  Varying degrees of spinal canal and foraminal stenosis, with advanced foraminal stenosis on the right L4-L5 and moderate left foraminal stenosis at L3-L4. Facet arthropathy at all lumbar levels, worst at L5/S1 on the right.  3.  Approximately 1.6 cm lesion in the upper pole the right kidney is hyperintense on both T1 and T2-weighted sequences, potentially due to proteinaceous contents or hemorrhage. Given that this is incompletely characterized by this exam, recommend ultrasound for further evaluation.  Findings related to conclusions #1 and 2 were discussed with Dr. Dossie Arbour by Dr. Wilhelmenia Blase via telephone on 08/16/2018 at 10:42 AM.  Note: Imaging results reviewed.         Pre-op Assessment:  Ms. Forbess is a 58 y.o. (year old), female patient, seen today for interventional treatment. She  has a past surgical history that includes Knee arthroscopy; Cesarean section; Hernia repair; Foot surgery (Left); and Kyphoplasty (N/A, 10/13/2018). Ms. Barhorst has a current medication list which includes the following prescription(s): acetaminophen, albuterol, alprazolam, aspirin, atorvastatin, bupropion, diazepam, fenofibrate, fluticasone, menthol (topical analgesic), valsartan-hydrochlorothiazide, vitamin d (ergocalciferol), zolpidem, and lisinopril-hydrochlorothiazide, and the following Facility-Administered Medications: fentanyl and midazolam. Her primarily concern today is the Back Pain  Initial Vital Signs:  Pulse/HCG Rate: (!) 143ECG Heart Rate: 80 Temp: 98.1 F (36.7 C) Resp:  12 BP: 130/67 SpO2: 96 %  BMI: Estimated body mass index is 33.45 kg/m as calculated  from the following:   Height as of this encounter: 5\' 8"  (1.727 m).   Weight as of this encounter: 220 lb (99.8 kg).  Risk Assessment: Allergies: Reviewed. She is allergic to latex.  Allergy Precautions: None required Coagulopathies: Reviewed. None identified.  Blood-thinner therapy: None at this time Active Infection(s): Reviewed. None identified. Ms. Yingling is afebrile  Site Confirmation: Ms. Boucher was asked to confirm the procedure and laterality before marking the site Procedure checklist: Completed Consent: Before the procedure and under the influence of no sedative(s), amnesic(s), or anxiolytics, the patient was informed of the treatment options, risks and possible complications. To fulfill our ethical and legal obligations, as recommended by the American Medical Association's Code of Ethics, I have informed the patient of my clinical impression; the nature and purpose of the treatment or procedure; the risks, benefits, and possible complications of the intervention; the alternatives, including doing nothing; the risk(s) and benefit(s) of the alternative treatment(s) or procedure(s); and the risk(s) and benefit(s) of doing nothing. The patient was provided information about the general risks and possible complications associated with the procedure. These may include, but are not limited to: failure to achieve desired goals, infection, bleeding, organ or nerve damage, allergic reactions, paralysis, and death. In addition, the patient was informed of those risks and complications associated to Spine-related procedures, such as failure to decrease pain; infection (i.e.: Meningitis, epidural or intraspinal abscess); bleeding (i.e.: epidural hematoma, subarachnoid hemorrhage, or any other type of intraspinal or peri-dural bleeding); organ or nerve damage (i.e.: Any type of peripheral nerve, nerve root, or spinal  cord injury) with subsequent damage to sensory, motor, and/or autonomic systems, resulting in permanent pain, numbness, and/or weakness of one or several areas of the body; allergic reactions; (i.e.: anaphylactic reaction); and/or death. Furthermore, the patient was informed of those risks and complications associated with the medications. These include, but are not limited to: allergic reactions (i.e.: anaphylactic or anaphylactoid reaction(s)); adrenal axis suppression; blood sugar elevation that in diabetics may result in ketoacidosis or comma; water retention that in patients with history of congestive heart failure may result in shortness of breath, pulmonary edema, and decompensation with resultant heart failure; weight gain; swelling or edema; medication-induced neural toxicity; particulate matter embolism and blood vessel occlusion with resultant organ, and/or nervous system infarction; and/or aseptic necrosis of one or more joints. Finally, the patient was informed that Medicine is not an exact science; therefore, there is also the possibility of unforeseen or unpredictable risks and/or possible complications that may result in a catastrophic outcome. The patient indicated having understood very clearly. We have given the patient no guarantees and we have made no promises. Enough time was given to the patient to ask questions, all of which were answered to the patient's satisfaction. Ms. Thien has indicated that she wanted to continue with the procedure. Attestation: I, the ordering provider, attest that I have discussed with the patient the benefits, risks, side-effects, alternatives, likelihood of achieving goals, and potential problems during recovery for the procedure that I have provided informed consent. Date  Time: 11/23/2018  9:47 AM  Pre-Procedure Preparation:  Monitoring: As per clinic protocol. Respiration, ETCO2, SpO2, BP, heart rate and rhythm monitor placed and checked for adequate  function Safety Precautions: Patient was assessed for positional comfort and pressure points before starting the procedure. Time-out: I initiated and conducted the "Time-out" before starting the procedure, as per protocol. The patient was asked to participate by confirming the accuracy of the "Time Out" information. Verification of  the correct person, site, and procedure were performed and confirmed by me, the nursing staff, and the patient. "Time-out" conducted as per Joint Commission's Universal Protocol (UP.01.01.01). Time: 1031  Description of Procedure:          Laterality: Bilateral. The procedure was performed in identical fashion on both sides. Levels:  L2, L3, L4, L5, & S1 Medial Branch Level(s) Area Prepped: Posterior Lumbosacral Region Prepping solution: ChloraPrep (2% chlorhexidine gluconate and 70% isopropyl alcohol) Safety Precautions: Aspiration looking for blood return was conducted prior to all injections. At no point did we inject any substances, as a needle was being advanced. Before injecting, the patient was told to immediately notify me if she was experiencing any new onset of "ringing in the ears, or metallic taste in the mouth". No attempts were made at seeking any paresthesias. Safe injection practices and needle disposal techniques used. Medications properly checked for expiration dates. SDV (single dose vial) medications used. After the completion of the procedure, all disposable equipment used was discarded in the proper designated medical waste containers. Local Anesthesia: Protocol guidelines were followed. The patient was positioned over the fluoroscopy table. The area was prepped in the usual manner. The time-out was completed. The target area was identified using fluoroscopy. A 12-in long, straight, sterile hemostat was used with fluoroscopic guidance to locate the targets for each level blocked. Once located, the skin was marked with an approved surgical skin marker. Once  all sites were marked, the skin (epidermis, dermis, and hypodermis), as well as deeper tissues (fat, connective tissue and muscle) were infiltrated with a small amount of a short-acting local anesthetic, loaded on a 10cc syringe with a 25G, 1.5-in  Needle. An appropriate amount of time was allowed for local anesthetics to take effect before proceeding to the next step. Local Anesthetic: Lidocaine 2.0% The unused portion of the local anesthetic was discarded in the proper designated containers. Technical explanation of process:  L2 Medial Branch Nerve Block (MBB): The target area for the L2 medial branch is at the junction of the postero-lateral aspect of the superior articular process and the superior, posterior, and medial edge of the transverse process of L3. Under fluoroscopic guidance, a Quincke needle was inserted until contact was made with os over the superior postero-lateral aspect of the pedicular shadow (target area). After negative aspiration for blood, 0.5 mL of the nerve block solution was injected without difficulty or complication. The needle was removed intact. L3 Medial Branch Nerve Block (MBB): The target area for the L3 medial branch is at the junction of the postero-lateral aspect of the superior articular process and the superior, posterior, and medial edge of the transverse process of L4. Under fluoroscopic guidance, a Quincke needle was inserted until contact was made with os over the superior postero-lateral aspect of the pedicular shadow (target area). After negative aspiration for blood, 0.5 mL of the nerve block solution was injected without difficulty or complication. The needle was removed intact. L4 Medial Branch Nerve Block (MBB): The target area for the L4 medial branch is at the junction of the postero-lateral aspect of the superior articular process and the superior, posterior, and medial edge of the transverse process of L5. Under fluoroscopic guidance, a Quincke needle was  inserted until contact was made with os over the superior postero-lateral aspect of the pedicular shadow (target area). After negative aspiration for blood, 0.5 mL of the nerve block solution was injected without difficulty or complication. The needle was removed intact. L5 Medial Branch  Nerve Block (MBB): The target area for the L5 medial branch is at the junction of the postero-lateral aspect of the superior articular process and the superior, posterior, and medial edge of the sacral ala. Under fluoroscopic guidance, a Quincke needle was inserted until contact was made with os over the superior postero-lateral aspect of the pedicular shadow (target area). After negative aspiration for blood, 0.5 mL of the nerve block solution was injected without difficulty or complication. The needle was removed intact. S1 Medial Branch Nerve Block (MBB): The target area for the S1 medial branch is at the posterior and inferior 6 o'clock position of the L5-S1 facet joint. Under fluoroscopic guidance, the Quincke needle inserted for the L5 MBB was redirected until contact was made with os over the inferior and postero aspect of the sacrum, at the 6 o' clock position under the L5-S1 facet joint (Target area). After negative aspiration for blood, 0.5 mL of the nerve block solution was injected without difficulty or complication. The needle was removed intact.  Nerve block solution: 0.2% PF-Ropivacaine + Triamcinolone (40 mg/mL) diluted to a final concentration of 4 mg of Triamcinolone/mL of Ropivacaine The unused portion of the solution was discarded in the proper designated containers. Procedural Needles: 22-gauge, 3.5-inch, Quincke needles used for all levels.  Once the entire procedure was completed, the treated area was cleaned, making sure to leave some of the prepping solution back to take advantage of its long term bactericidal properties.   Illustration of the posterior view of the lumbar spine and the posterior  neural structures. Laminae of L2 through S1 are labeled. DPRL5, dorsal primary ramus of L5; DPRS1, dorsal primary ramus of S1; DPR3, dorsal primary ramus of L3; FJ, facet (zygapophyseal) joint L3-L4; I, inferior articular process of L4; LB1, lateral branch of dorsal primary ramus of L1; IAB, inferior articular branches from L3 medial branch (supplies L4-L5 facet joint); IBP, intermediate branch plexus; MB3, medial branch of dorsal primary ramus of L3; NR3, third lumbar nerve root; S, superior articular process of L5; SAB, superior articular branches from L4 (supplies L4-5 facet joint also); TP3, transverse process of L3.  Vitals:   11/23/18 1040 11/23/18 1050 11/23/18 1100 11/23/18 1110  BP: 134/85 (!) 128/94 (!) 139/101 110/89  Pulse:      Resp: 16 20 16 17   Temp:  98.3 F (36.8 C)  98.3 F (36.8 C)  SpO2: 94% 97% 97% 98%  Weight:      Height:         Start Time: 1031 hrs. End Time: 1040 hrs.  Imaging Guidance (Spinal):          Type of Imaging Technique: Fluoroscopy Guidance (Spinal) Indication(s): Assistance in needle guidance and placement for procedures requiring needle placement in or near specific anatomical locations not easily accessible without such assistance. Exposure Time: Please see nurses notes. Contrast: None used. Fluoroscopic Guidance: I was personally present during the use of fluoroscopy. "Tunnel Vision Technique" used to obtain the best possible view of the target area. Parallax error corrected before commencing the procedure. "Direction-depth-direction" technique used to introduce the needle under continuous pulsed fluoroscopy. Once target was reached, antero-posterior, oblique, and lateral fluoroscopic projection used confirm needle placement in all planes. Images permanently stored in EMR. Interpretation: No contrast injected. I personally interpreted the imaging intraoperatively. Adequate needle placement confirmed in multiple planes. Permanent images saved into the  patient's record.  Antibiotic Prophylaxis:   Anti-infectives (From admission, onward)   None     Indication(s): None  identified  Post-operative Assessment:  Post-procedure Vital Signs:  Pulse/HCG Rate: (!) 14380 Temp: 98.3 F (36.8 C) Resp: 17 BP: 110/89 SpO2: 98 %  EBL: None  Complications: No immediate post-treatment complications observed by team, or reported by patient.  Note: The patient tolerated the entire procedure well. A repeat set of vitals were taken after the procedure and the patient was kept under observation following institutional policy, for this type of procedure. Post-procedural neurological assessment was performed, showing return to baseline, prior to discharge. The patient was provided with post-procedure discharge instructions, including a section on how to identify potential problems. Should any problems arise concerning this procedure, the patient was given instructions to immediately contact us, at any time, without hesitation. In any case, we plan to contact the patient by telephone for a follow-up status report regarding this interventional procedure.  Comments:  No additional relevant information.  Plan of Care    Imaging Orders     DG C-Arm 1-60 Min-No Report  Procedure Orders     LUMBAR FACET(MEDIAL BRANCH NERVE BLOCK) MBNB  Medications ordered for procedure: Meds ordered this encounter  Medications  . lidocaine (XYLOCAINE) 2 % (with pres) injection 400 mg  . midazolam (VERSED) 5 MG/5ML injection 1-2 mg    Make sure Flumazenil is available in the pyxis when using this medication. If oversedation occurs, administer 0.2 mg IV over 15 sec. If after 45 sec no response, administer 0.2 mg again over 1 min; may repeat at 1 min intervals; not to exceed 4 doses (1 mg)  . fentaNYL (SUBLIMAZE) injection 25-50 mcg    Make sure Narcan is available in the pyxis when using this medication. In the event of respiratory depression (RR< 8/min): Titrate NARCAN  (naloxone) in increments of 0.1 to 0.2 mg IV at 2-3 minute intervals, until desired degree of reversal.  . lactated ringers infusion 1,000 mL  . ropivacaine (PF) 2 mg/mL (0.2%) (NAROPIN) injection 18 mL  . triamcinolone acetonide (KENALOG-40) injection 80 mg   Medications administered: We administered lidocaine, midazolam, fentaNYL, lactated ringers, ropivacaine (PF) 2 mg/mL (0.2%), and triamcinolone acetonide.  See the medical record for exact dosing, route, and time of administration.  Disposition: Discharge home  Discharge Date & Time: 11/23/2018; 1113 hrs.   Physician-requested Follow-up: Return for PPE (2 wks), w/ Dr. Dossie Arbour.  Future Appointments  Date Time Provider [facility]  12/06/2018  1:15 PM Milinda Pointer, MD [facility] None   Primary Care Physician: Chesley Noon, MD Location: [facility] Note by: Gaspar Cola, MD Date: 11/23/2018; Time: 11:32 AM  Disclaimer:  Medicine is not an exact science. The only guarantee in medicine is that nothing is guaranteed. It is important to note that the decision to proceed with this intervention was based on the information collected from the patient. The Data and conclusions were drawn from the patient's questionnaire, the interview, and the physical examination. Because the information was provided in large part by the patient, it cannot be guaranteed that it has not been purposely or unconsciously manipulated. Every effort has been made to obtain as much relevant data as possible for this evaluation. It is important to note that the conclusions that lead to this procedure are derived in large part from the available data. Always take into account that the treatment will also be dependent on availability of resources and existing treatment guidelines, considered by other Pain Management Practitioners as being common knowledge and practice, at the time of the intervention. For  Medico-Legal purposes,  it is also important to point out that variation in procedural techniques and pharmacological choices are the acceptable norm. The indications, contraindications, technique, and results of the above procedure should only be interpreted and judged by a Board-Certified Interventional Pain Specialist with extensive familiarity and expertise in the same exact procedure and technique.

## 2018-11-24 ENCOUNTER — Telehealth: Payer: Self-pay | Admitting: *Deleted

## 2018-11-24 NOTE — Telephone Encounter (Signed)
Denies complications post procedure. 

## 2018-12-06 ENCOUNTER — Ambulatory Visit: Payer: Managed Care, Other (non HMO) | Admitting: Pain Medicine

## 2019-01-16 ENCOUNTER — Other Ambulatory Visit: Payer: Self-pay | Admitting: Pain Medicine

## 2019-01-16 DIAGNOSIS — M25551 Pain in right hip: Secondary | ICD-10-CM

## 2019-01-16 DIAGNOSIS — M5136 Other intervertebral disc degeneration, lumbar region: Secondary | ICD-10-CM

## 2019-01-16 DIAGNOSIS — M79604 Pain in right leg: Secondary | ICD-10-CM

## 2019-01-16 DIAGNOSIS — M51369 Other intervertebral disc degeneration, lumbar region without mention of lumbar back pain or lower extremity pain: Secondary | ICD-10-CM

## 2019-01-16 DIAGNOSIS — M5431 Sciatica, right side: Secondary | ICD-10-CM

## 2019-01-16 MED ORDER — PREDNISONE 20 MG PO TABS: ORAL_TABLET | ORAL | 0 refills | Status: AC

## 2019-01-17 ENCOUNTER — Other Ambulatory Visit: Payer: Self-pay

## 2019-01-17 ENCOUNTER — Ambulatory Visit: Payer: Managed Care, Other (non HMO) | Attending: Pain Medicine | Admitting: Pain Medicine

## 2019-01-17 NOTE — Progress Notes (Deleted)
Pain Management Virtual Encounter Note - Virtual Visit via Telephone Telehealth (real-time audio visits between healthcare provider and patient).  Patient's Phone No. & Preferred Pharmacy:  [phone] (home); [phone] (mobile); (Preferred) [phone] [email or web address]  [id number] [place], [place] [address] Sa[address] [place] [address] Phone: [phone] Fax: [phone]   Pre-screening note:  Our staff contacted Ms. Huettner and offered her an "in person", "face-to-face" appointment versus a telephone encounter. She indicated preferring the telephone encounter, at this time.  Reason for Virtual Visit: COVID-19*  Social distancing based on CDC and AMA recommendations.   I contacted DIOR DOMINIK on 01/17/2019 at 7:35 AM via telephone and clearly identified myself as Gaspar Cola, MD. I verified that I was speaking with the correct person using two identifiers (Name and date of birth: Jul 26, 1961).  Advanced Informed Consent I sought verbal advanced consent from Wyatt Portela for virtual visit interactions. I informed Ms. Dobrowolski of possible security and privacy concerns, risks, and limitations associated with providing "not-in-person" medical evaluation and management services. I also informed Ms. Runions of the availability of "in-person" appointments. Finally, I informed her that there would be a charge for the virtual visit and that she could be  personally, fully or partially, financially responsible for it. Ms. Paquette expressed understanding and agreed to proceed.   Historic Elements   Ms. Rhonda Espinoza is a 58 y.o. year old, female patient evaluated today after her last encounter by our practice on 11/24/2018. Ms. Fuller  has a past medical history of ADD (attention deficit disorder), Anxiety, Arthritis, Asthma, Disc disorder, Hyperlipidemia, Hypertension, and Pneumonia. She also  has a past surgical history that includes Knee arthroscopy; Cesarean section;  Hernia repair; Foot surgery (Left); and Kyphoplasty (N/A, 10/13/2018). Ms. Janota has a current medication list which includes the following prescription(s): acetaminophen, albuterol, alprazolam, aspirin, atorvastatin, bupropion, diazepam, fenofibrate, fluticasone, lisinopril-hydrochlorothiazide, menthol (topical analgesic), prednisone, valsartan-hydrochlorothiazide, vitamin d (ergocalciferol), and zolpidem. She  reports that she has never smoked. She has never used smokeless tobacco. She reports current alcohol use. She reports that she does not use drugs. Ms. Tibbitts is allergic to latex.   HPI  I last saw her on 11/23/2018. She is being evaluated for new problems.  Post-Procedure Evaluation  Procedure: Diagnostic bilateral lumbar facet block #1 under fluoroscopic guidance and IV sedation Pre-procedure pain level:  6/10 Post-procedure: 0/10 (100% relief)  Sedation: Please see nurses note.  Effectiveness during initial hour after procedure(Ultra-Short Term Relief): 100 %  Local anesthetic used: Long-acting (4-6 hours) Effectiveness: Defined as any analgesic benefit obtained secondary to the administration of local anesthetics. This carries significant diagnostic value as to the etiological location, or anatomical origin, of the pain. Duration of benefit is expected to coincide with the duration of the local anesthetic used.  Effectiveness during initial 4-6 hours after procedure(Short-Term Relief): 100 %  Long-term benefit: Defined as any relief past the pharmacologic duration of the local anesthetics.  Effectiveness past the initial 6 hours after procedure(Long-Term Relief): *** %  Current benefits: Defined as benefit that persist at this time.   Analgesia:   ***  Function: Somewhat improved ROM: Somewhat improved  Review of recent tests  DG C-Arm 1-60 Min-No Report Fluoroscopy was utilized by the requesting physician.  No radiographic  interpretation.    Hospital Outpatient Visit on  10/11/2018  Component Date Value Ref Range Status  . aPTT 10/11/2018 29  24 - 36 seconds Final   Performed at [facility],  [address], [place], [place] [address]  . WBC 10/11/2018 8.0  4.0 - 10.5 K/uL Final  . RBC 10/11/2018 3.97  3.87 - 5.11 MIL/uL Final  . Hemoglobin 10/11/2018 13.3  12.0 - 15.0 g/dL Final  . HCT 10/11/2018 40.1  36.0 - 46.0 % Final  . MCV 10/11/2018 101.0* 80.0 - 100.0 fL Final  . MCH 10/11/2018 33.5  26.0 - 34.0 pg Final  . MCHC 10/11/2018 33.2  30.0 - 36.0 g/dL Final  . RDW 10/11/2018 15.7* 11.5 - 15.5 % Final  . Platelets 10/11/2018 411* 150 - 400 K/uL Final  . nRBC 10/11/2018 0.0  0.0 - 0.2 % Final  . Neutrophils Relative % 10/11/2018 53  % Final  . Neutro Abs 10/11/2018 4.2  1.7 - 7.7 K/uL Final  . Lymphocytes Relative 10/11/2018 34  % Final  . Lymphs Abs 10/11/2018 2.7  0.7 - 4.0 K/uL Final  . Monocytes Relative 10/11/2018 8  % Final  . Monocytes Absolute 10/11/2018 0.7  0.1 - 1.0 K/uL Final  . Eosinophils Relative 10/11/2018 4  % Final  . Eosinophils Absolute 10/11/2018 0.3  0.0 - 0.5 K/uL Final  . Basophils Relative 10/11/2018 1  % Final  . Basophils Absolute 10/11/2018 0.1  0.0 - 0.1 K/uL Final  . Immature Granulocytes 10/11/2018 0  % Final  . Abs Immature Granulocytes 10/11/2018 0.03  0.00 - 0.07 K/uL Final   Performed at [facility], [place] [address], [place], [place] [address]  . Sodium 10/11/2018 134* 135 - 145 mmol/L Final  . Potassium 10/11/2018 4.1  3.5 - 5.1 mmol/L Final  . Chloride 10/11/2018 100  98 - 111 mmol/L Final  . CO2 10/11/2018 23  22 - 32 mmol/L Final  . Glucose, Bld 10/11/2018 114* 70 - 99 mg/dL Final  . BUN 10/11/2018 5* 6 - 20 mg/dL Final  . Creatinine, Ser 10/11/2018 0.78  0.44 - 1.00 mg/dL Final  . Calcium 10/11/2018 9.2  8.9 - 10.3 mg/dL Final  . Total Protein 10/11/2018 7.2  6.5 - 8.1 g/dL Final  . Albumin 10/11/2018 4.1  3.5 - 5.0 g/dL Final  . AST 10/11/2018 71* 15 - 41 U/L Final  . ALT 10/11/2018 61* 0 -  44 U/L Final  . Alkaline Phosphatase 10/11/2018 59  38 - 126 U/L Final  . Total Bilirubin 10/11/2018 0.7  0.3 - 1.2 mg/dL Final  . GFR calc non Af Amer 10/11/2018 >60  >60 mL/min Final  . GFR calc Af Amer 10/11/2018 >60  >60 mL/min Final  . Anion gap 10/11/2018 11  5 - 15 Final   Performed at [facility], [place] [address] [place], [place] [address]  . Prothrombin Time 10/11/2018 13.9  11.4 - 15.2 seconds Final  . INR 10/11/2018 1.08   Final   Performed at [facility], [place] [address], [place], [place] [address]  . ABO/RH(D) 10/11/2018 A POS   Final  . Antibody Screen 10/11/2018 NEG   Final  . Sample Expiration 10/11/2018 10/25/2018   Final  . Extend sample reason 10/11/2018    Final                   Value:NO TRANSFUSIONS OR PREGNANCY IN THE PAST 3 MONTHS Performed at [facility], [place] [address] [place], [place] [address]   . Color, Urine 10/11/2018 YELLOW  YELLOW Final  . APPearance 10/11/2018 CLEAR  CLEAR Final  . Specific Gravity, Urine 10/11/2018 1.010  1.005 - 1.030 Final  . pH 10/11/2018 6.0  5.0 - 8.0 Final  . Glucose, UA 10/11/2018 NEGATIVE  NEGATIVE mg/dL Final  . Hgb urine dipstick 10/11/2018 NEGATIVE  NEGATIVE Final  . Bilirubin Urine 10/11/2018 NEGATIVE  NEGATIVE Final  . Ketones, ur 10/11/2018 NEGATIVE  NEGATIVE mg/dL Final  . Protein, ur 10/11/2018 NEGATIVE  NEGATIVE mg/dL Final  . Nitrite 10/11/2018 NEGATIVE  NEGATIVE Final  . Leukocytes, UA 10/11/2018 MODERATE* NEGATIVE Final   Performed at [facility], [place] [address], [place], [place] [address]  . MRSA, PCR 10/11/2018 NEGATIVE  NEGATIVE Final  . Staphylococcus aureus 10/11/2018 POSITIVE* NEGATIVE Final   Comment: (NOTE) The Xpert SA Assay (FDA approved for NASAL specimens in patients 67 years of age and older), is one component of a comprehensive surveillance program. It is not intended to diagnose infection nor to guide or monitor treatment. Performed at [facility], [place]  [address] [place], [place] [address]   . RBC / HPF 10/11/2018 NONE SEEN  0 - 5 RBC/hpf Final  . WBC, UA 10/11/2018 6-10  0 - 5 WBC/hpf Final  . Bacteria, UA 10/11/2018 RARE* NONE SEEN Final  . Squamous Epithelial / LPF 10/11/2018 0-5  0 - 5 Final  . Urine-Other 10/11/2018 LESS THAN 10 mL OF URINE SUBMITTED   Final   Performed at [facility], [place] [address] [place], [place] [address]  . ABO/RH(D) 10/11/2018    Final                   Value:A POS Performed at [facility], [place] [address] [place], [place] [address]    Assessment  The primary encounter diagnosis was Acute leg pain, right. Diagnoses of Hip pain, acute, right and Sciatica of right side without back pain were also pertinent to this visit.  Plan of Care  I am having Tami B. Robison maintain her ALPRAZolam, aspirin, Vitamin D (Ergocalciferol), fenofibrate, lisinopril-hydrochlorothiazide, zolpidem, atorvastatin, diazepam, buPROPion, albuterol, (Menthol, Topical Analgesic, (BIOFREEZE EX)), acetaminophen, valsartan-hydrochlorothiazide, fluticasone, and predniSONE.  Pharmacotherapy (Medications Ordered): No orders of the defined types were placed in this encounter.  Orders:  No orders of the defined types were placed in this encounter.  Follow-up plan:   No follow-ups on file.   I discussed the assessment and treatment plan with the patient. The patient was provided an opportunity to ask questions and all were answered. The patient agreed with the plan and demonstrated an understanding of the instructions.  Patient advised to call back or seek an in-person evaluation if the symptoms or condition worsens.  Total duration of non-face-to-face encounter: *** minutes.  Note by: Gaspar Cola, MD Date: 01/17/2019; Time: 7:35 AM  Disclaimer:  * Given the special circumstances of the COVID-19 pandemic, the federal government has announced that the Office for Civil Rights (OCR) will exercise its enforcement discretion  and will not impose penalties on physicians using telehealth in the event of noncompliance with regulatory requirements under the [place] and Accountability Act (HIPAA) in connection with the good faith provision of telehealth during the QIWLN-98 national public health emergency. ([place])

## 2019-01-20 ENCOUNTER — Encounter: Payer: Self-pay | Admitting: Pain Medicine

## 2019-01-23 NOTE — Progress Notes (Signed)
Pain Management Virtual Encounter Note - Virtual Visit via Telephone Telehealth (real-time audio visits between healthcare provider and patient).  Patient's Phone No. & Preferred Pharmacy:  [phone] (home); [phone] (mobile); (Preferred) [phone] [email or web address]  [id number] [place], [place] [address] Phone: [phone] Fax: [phone]   Pre-screening note:  Our staff contacted Rhonda Espinoza and offered her an "in person", "face-to-face" appointment versus a telephone encounter. She indicated preferring the telephone encounter, at this time.  Reason for Virtual Visit: COVID-19*  Social distancing based on CDC and AMA recommendations.   I contacted Rhonda Espinoza on 01/24/2019 at 8:48 AM via telephone and clearly identified myself as Gaspar Cola, MD. I verified that I was speaking with the correct person using two identifiers (Name and date of birth: 07/23/1961).  Advanced Informed Consent I sought verbal advanced consent from Rhonda Espinoza for virtual visit interactions. I informed Rhonda Espinoza of possible security and privacy concerns, risks, and limitations associated with providing "not-in-person" medical evaluation and management services. I also informed Rhonda Espinoza of the availability of "in-person" appointments. Finally, I informed her that there would be a charge for the virtual visit and that she could be  personally, fully or partially, financially responsible for it. Rhonda Espinoza expressed understanding and agreed to proceed.   Historic Elements   Rhonda Espinoza is a 58 y.o. year old, female patient evaluated today after her last encounter by our practice on 01/17/2019. Rhonda Espinoza  has a past medical history of ADD (attention deficit disorder), Anxiety, Arthritis, Asthma, Disc disorder, Hyperlipidemia, Hypertension, and Pneumonia. She also  has a past surgical history that includes Knee arthroscopy; Cesarean section;  Hernia repair; Foot surgery (Left); and Kyphoplasty (N/A, 10/13/2018). Rhonda Espinoza has a current medication list which includes the following prescription(s): acetaminophen, albuterol, alprazolam, amphetamine-dextroamphetamine, aspirin, atorvastatin, bupropion, diazepam, fenofibrate, fluticasone, menthol (topical analgesic), omeprazole, prednisone, valsartan-hydrochlorothiazide, vitamin d (ergocalciferol), zolpidem, and lisinopril-hydrochlorothiazide. She  reports that she has never smoked. She has never used smokeless tobacco. She reports current alcohol use. She reports that she does not use drugs. Rhonda Espinoza is allergic to latex.   HPI  I last saw her on 01/17/2019. She is being evaluated for  postprocedure evaluation as well as follow-up evaluation after a flareup of her low back pain and treatment with oral steroids.  I recently received a phone call from the patient indicated that she had done something to flareup her low back pain.  She indicated the pain was in both sides of her lower back and going down to the back of the legs, following a pattern believed to be referred from the facet joints.  Since we are under COVID-19 restrictions and I cannot offer her in interventional therapy I went ahead and gave her a prescription for a steroid taper pack.  Today she indicates that the pain has improved significantly to the point where she was able to get out of bed and she has resumed some of her daily activities.  However, she continues to work at home on shortness that may flareup this pain again.  She was describing recently having vacuumed her home, which seems to have reversed some of the benefits that she had pain with the oral steroid pack.  The long-term plan of this patient is to complete a series of 2 diagnostic lumbar facet blocks and if both show the patient to get good relief of the pain after the procedure, then if the  pain persists, then we will move onto a radiofrequency ablation of those  facets.  Post-Procedure Evaluation  Procedure: Diagnostic bilateral lumbar facet block #1 under fluoroscopic guidance and IV sedation Pre-procedure pain level:  6/10 Post-procedure: 0/10 (100% relief)  Sedation: Sedation provided.  Effectiveness during initial hour after procedure(Ultra-Short Term Relief): 100 %  Local anesthetic used: Long-acting (4-6 hours) Effectiveness: Defined as any analgesic benefit obtained secondary to the administration of local anesthetics. This carries significant diagnostic value as to the etiological location, or anatomical origin, of the pain. Duration of benefit is expected to coincide with the duration of the local anesthetic used.  Effectiveness during initial 4-6 hours after procedure(Short-Term Relief): 100 %  Long-term benefit: Defined as any relief past the pharmacologic duration of the local anesthetics.  Effectiveness past the initial 6 hours after procedure(Long-Term Relief): 100 % x 2-3 weeks  Current benefits: Defined as benefit that persist at this time.   Analgesia:  <50% better Function: Back to baseline ROM: Back to baseline  Pharmacotherapy Assessment  Analgesic: Tramadol 50 mg, 1 to 2 tablets p.o. every 6 hours MME/day: 40 mg/day.   Monitoring: Pharmacotherapy: No side-effects or adverse reactions reported. Rhonda Espinoza PMP: PDMP reviewed during this encounter.       Compliance: No problems identified or detected. Plan: Refer to "POC".  Review of recent tests  DG C-Arm 1-60 Min-No Report Fluoroscopy was utilized by the requesting physician.  No radiographic  interpretation.    Hospital Outpatient Visit on 10/11/2018  Component Date Value Ref Range Status  . aPTT 10/11/2018 29  24 - 36 seconds Final   Performed at [facility], [place] [address] [place], [place] [address]  . WBC 10/11/2018 8.0  4.0 - 10.5 K/uL Final  . RBC 10/11/2018 3.97  3.87 - 5.11 MIL/uL Final  . Hemoglobin 10/11/2018 13.3  12.0 - 15.0 g/dL Final  . HCT  10/11/2018 40.1  36.0 - 46.0 % Final  . MCV 10/11/2018 101.0* 80.0 - 100.0 fL Final  . MCH 10/11/2018 33.5  26.0 - 34.0 pg Final  . MCHC 10/11/2018 33.2  30.0 - 36.0 g/dL Final  . RDW 10/11/2018 15.7* 11.5 - 15.5 % Final  . Platelets 10/11/2018 411* 150 - 400 K/uL Final  . nRBC 10/11/2018 0.0  0.0 - 0.2 % Final  . Neutrophils Relative % 10/11/2018 53  % Final  . Neutro Abs 10/11/2018 4.2  1.7 - 7.7 K/uL Final  . Lymphocytes Relative 10/11/2018 34  % Final  . Lymphs Abs 10/11/2018 2.7  0.7 - 4.0 K/uL Final  . Monocytes Relative 10/11/2018 8  % Final  . Monocytes Absolute 10/11/2018 0.7  0.1 - 1.0 K/uL Final  . Eosinophils Relative 10/11/2018 4  % Final  . Eosinophils Absolute 10/11/2018 0.3  0.0 - 0.5 K/uL Final  . Basophils Relative 10/11/2018 1  % Final  . Basophils Absolute 10/11/2018 0.1  0.0 - 0.1 K/uL Final  . Immature Granulocytes 10/11/2018 0  % Final  . Abs Immature Granulocytes 10/11/2018 0.03  0.00 - 0.07 K/uL Final   Performed at [facility], [place] [address], [place], [place] [address]  . Sodium 10/11/2018 134* 135 - 145 mmol/L Final  . Potassium 10/11/2018 4.1  3.5 - 5.1 mmol/L Final  . Chloride 10/11/2018 100  98 - 111 mmol/L Final  . CO2 10/11/2018 23  22 - 32 mmol/L Final  . Glucose, Bld 10/11/2018 114* 70 - 99 mg/dL Final  . BUN 10/11/2018 5* 6 -  20 mg/dL Final  . Creatinine, Ser 10/11/2018 0.78  0.44 - 1.00 mg/dL Final  . Calcium 10/11/2018 9.2  8.9 - 10.3 mg/dL Final  . Total Protein 10/11/2018 7.2  6.5 - 8.1 g/dL Final  . Albumin 10/11/2018 4.1  3.5 - 5.0 g/dL Final  . AST 10/11/2018 71* 15 - 41 U/L Final  . ALT 10/11/2018 61* 0 - 44 U/L Final  . Alkaline Phosphatase 10/11/2018 59  38 - 126 U/L Final  . Total Bilirubin 10/11/2018 0.7  0.3 - 1.2 mg/dL Final  . GFR calc non Af Amer 10/11/2018 >60  >60 mL/min Final  . GFR calc Af Amer 10/11/2018 >60  >60 mL/min Final  . Anion gap 10/11/2018 11  5 - 15 Final   Performed at [facility], [place] [address] [place], [place] [address]  . Prothrombin Time 10/11/2018 13.9  11.4 - 15.2 seconds Final  . INR 10/11/2018 1.08   Final   Performed at [facility], [place] [address], [place], [place] [address]  . ABO/RH(D) 10/11/2018 A POS   Final  . Antibody Screen 10/11/2018 NEG   Final  . Sample Expiration 10/11/2018 10/25/2018   Final  . Extend sample reason 10/11/2018    Final                   Value:NO TRANSFUSIONS OR PREGNANCY IN THE PAST 3 MONTHS Performed at [facility], [place] [address], [place], [place] [address]   . Color, Urine 10/11/2018 YELLOW  YELLOW Final  . APPearance 10/11/2018 CLEAR  CLEAR Final  . Specific Gravity, Urine 10/11/2018 1.010  1.005 - 1.030 Final  . pH 10/11/2018 6.0  5.0 - 8.0 Final  . Glucose, UA 10/11/2018 NEGATIVE  NEGATIVE mg/dL Final  . Hgb urine dipstick 10/11/2018 NEGATIVE  NEGATIVE Final  . Bilirubin Urine 10/11/2018 NEGATIVE  NEGATIVE Final  . Ketones, ur 10/11/2018 NEGATIVE  NEGATIVE mg/dL Final  . Protein, ur 10/11/2018 NEGATIVE  NEGATIVE mg/dL Final  . Nitrite 10/11/2018 NEGATIVE  NEGATIVE Final  . Leukocytes, UA 10/11/2018 MODERATE* NEGATIVE Final   Performed at [facility], [place] [address] [place], [place] [address]  . MRSA, PCR 10/11/2018 NEGATIVE  NEGATIVE Final  . Staphylococcus aureus 10/11/2018 POSITIVE* NEGATIVE Final   Comment: (NOTE) The Xpert SA Assay (FDA approved for NASAL specimens in patients 70 years of age and older), is one component of a comprehensive surveillance program. It is not intended to diagnose infection nor to guide or monitor treatment. Performed at [facility], [place] [address], [place], [place] [address]   . RBC / HPF 10/11/2018 NONE SEEN  0 - 5 RBC/hpf Final  . WBC, UA 10/11/2018 6-10  0 - 5 WBC/hpf Final  . Bacteria, UA 10/11/2018 RARE* NONE SEEN Final  . Squamous Epithelial / LPF 10/11/2018 0-5  0 - 5 Final  . Urine-Other 10/11/2018 LESS THAN 10 mL OF URINE SUBMITTED   Final   Performed at  [facility], [place] [address], [place], [place] [address]  . ABO/RH(D) 10/11/2018    Final                   Value:A POS Performed at [facility], [place] [address] [place], [place] [address]    Assessment  The primary encounter diagnosis was Chronic low back pain (Bilateral) (R>L). Diagnoses of Lumbar facet syndrome (Bilateral) (R>L), Lumbar facet hypertrophy (Multilevel) (Bilateral), Lumbar Grade 1  Retrolisthesis of L2/L3, L3/L4, & L4/L5, Grade 1 Lumbar Anterolisthesis of L5/S1, and History of T12 kyphoplasty were also pertinent to this visit.  Plan of Care  I am having Rhonda Espinoza maintain her ALPRAZolam, aspirin, Vitamin D (Ergocalciferol), fenofibrate, lisinopril-hydrochlorothiazide, zolpidem, atorvastatin, diazepam, buPROPion, albuterol, (Menthol, Topical Analgesic, (BIOFREEZE EX)), acetaminophen, valsartan-hydrochlorothiazide, fluticasone, predniSONE, amphetamine-dextroamphetamine, and omeprazole.  Pharmacotherapy (Medications Ordered): No orders of the defined types were placed in this encounter.  Orders:  Orders Placed This Encounter  Procedures  . LUMBAR FACET(MEDIAL BRANCH NERVE BLOCK) MBNB    Standing Status:   Future    Standing Expiration Date:   02/23/2019    Scheduling Instructions:     Side: Bilateral     Level: L3-4, L4-5, & L5-S1 Facets (L2, L3, L4, L5, & S1 Medial Branch Nerves)     Sedation: Patient's choice.     Timeframe: ASAA    Order Specific Question:   Where will this procedure be performed?    Answer:   [facility]   Follow-up plan:   Return for Procedure, (w/ sedation): (B) L-FCT BLK #2.   I discussed the assessment and treatment plan with the patient. The patient was provided an opportunity to ask questions and all were answered. The patient agreed with the plan and demonstrated an understanding of the instructions.  Patient advised to call back or seek an in-person evaluation if the symptoms or condition worsens.  Total duration  of non-face-to-face encounter: 15 minutes.  Note by: Gaspar Cola, MD Date: 01/24/2019; Time: 10:00 AM  Disclaimer:  * Given the special circumstances of the COVID-19 pandemic, the federal government has announced that the Office for Civil Rights (OCR) will exercise its enforcement discretion and will not impose penalties on physicians using telehealth in the event of noncompliance with regulatory requirements under the [place] (HIPAA) in connection with the good faith provision of telehealth during the TDSKA-76 national public health emergency. ([place])

## 2019-01-24 ENCOUNTER — Ambulatory Visit: Payer: Managed Care, Other (non HMO) | Attending: Pain Medicine | Admitting: Pain Medicine

## 2019-01-24 ENCOUNTER — Other Ambulatory Visit: Payer: Self-pay

## 2019-01-24 DIAGNOSIS — Z9889 Other specified postprocedural states: Secondary | ICD-10-CM | POA: Insufficient documentation

## 2019-01-24 DIAGNOSIS — G8929 Other chronic pain: Secondary | ICD-10-CM

## 2019-01-24 DIAGNOSIS — M545 Low back pain: Secondary | ICD-10-CM

## 2019-01-24 DIAGNOSIS — M4310 Spondylolisthesis, site unspecified: Secondary | ICD-10-CM | POA: Diagnosis not present

## 2019-01-24 DIAGNOSIS — M47816 Spondylosis without myelopathy or radiculopathy, lumbar region: Secondary | ICD-10-CM | POA: Diagnosis not present

## 2019-01-24 NOTE — Patient Instructions (Signed)

## 2019-03-16 ENCOUNTER — Emergency Department (HOSPITAL_COMMUNITY)
Admission: EM | Admit: 2019-03-16 | Discharge: 2019-03-17 | Disposition: A | Discharge status: Home / Self Care | Payer: Managed Care, Other (non HMO) | Attending: Emergency Medicine | Admitting: Emergency Medicine

## 2019-03-16 DIAGNOSIS — I10 Essential (primary) hypertension: Secondary | ICD-10-CM | POA: Diagnosis not present

## 2019-03-16 DIAGNOSIS — R0789 Other chest pain: Secondary | ICD-10-CM

## 2019-03-16 DIAGNOSIS — Z79899 Other long term (current) drug therapy: Secondary | ICD-10-CM | POA: Insufficient documentation

## 2019-03-16 DIAGNOSIS — R079 Chest pain, unspecified: Secondary | ICD-10-CM | POA: Diagnosis present

## 2019-03-16 DIAGNOSIS — J45909 Unspecified asthma, uncomplicated: Secondary | ICD-10-CM | POA: Insufficient documentation

## 2019-03-16 DIAGNOSIS — E876 Hypokalemia: Secondary | ICD-10-CM | POA: Diagnosis not present

## 2019-03-16 DIAGNOSIS — Z7982 Long term (current) use of aspirin: Secondary | ICD-10-CM | POA: Insufficient documentation

## 2019-03-16 NOTE — ED Triage Notes (Signed)
Arrived by EMS from home patient reports right chest pain X3 days that radiates into right arm and tingling in right hand. Patient states pain is getting worse. Patient has been taking Ibuprofen and muscle relaxants without relief. CBG 133 received 100 mL 0.9% NS en route to facility.

## 2019-03-17 ENCOUNTER — Emergency Department (HOSPITAL_COMMUNITY): Payer: Managed Care, Other (non HMO)

## 2019-03-17 LAB — BASIC METABOLIC PANEL
Anion gap: 15 (ref 5–15)
BUN: 9 mg/dL (ref 6–20)
CO2: 21 mmol/L — ABNORMAL LOW (ref 22–32)
Calcium: 8.8 mg/dL — ABNORMAL LOW (ref 8.9–10.3)
Chloride: 98 mmol/L (ref 98–111)
Creatinine, Ser: 0.75 mg/dL (ref 0.44–1.00)
GFR calc Af Amer: >60 mL/min (ref >60)
GFR calc non Af Amer: >60 mL/min (ref >60)
Glucose, Bld: 106 mg/dL — ABNORMAL HIGH (ref 70–99)
Potassium: 3 mmol/L — ABNORMAL LOW (ref 3.5–5.1)
Sodium: 134 mmol/L — ABNORMAL LOW (ref 135–145)

## 2019-03-17 LAB — CBC
HCT: 34.8 % — ABNORMAL LOW (ref 36.0–46.0)
Hemoglobin: 11.3 g/dL — ABNORMAL LOW (ref 12.0–15.0)
MCH: 33.5 pg (ref 26.0–34.0)
MCHC: 32.5 g/dL (ref 30.0–36.0)
MCV: 103.3 fL — ABNORMAL HIGH (ref 80.0–100.0)
Platelets: 328 10*3/uL (ref 150–400)
RBC: 3.37 MIL/uL — ABNORMAL LOW (ref 3.87–5.11)
RDW: 13.6 % (ref 11.5–15.5)
WBC: 6.9 10*3/uL (ref 4.0–10.5)
nRBC: 0 % (ref 0.0–0.2)

## 2019-03-17 LAB — TROPONIN I
Troponin I: <0.03 ng/mL (ref <0.03)
Troponin I: <0.03 ng/mL (ref <0.03)

## 2019-03-17 IMAGING — CR CHEST - 2 VIEW
2 series · 2 of 2 positions shown · non-contrast
Comparison: None.

CLINICAL DATA: Chest pain and shortness of breath.

EXAM:
CHEST - 2 VIEW

[w chest pa]
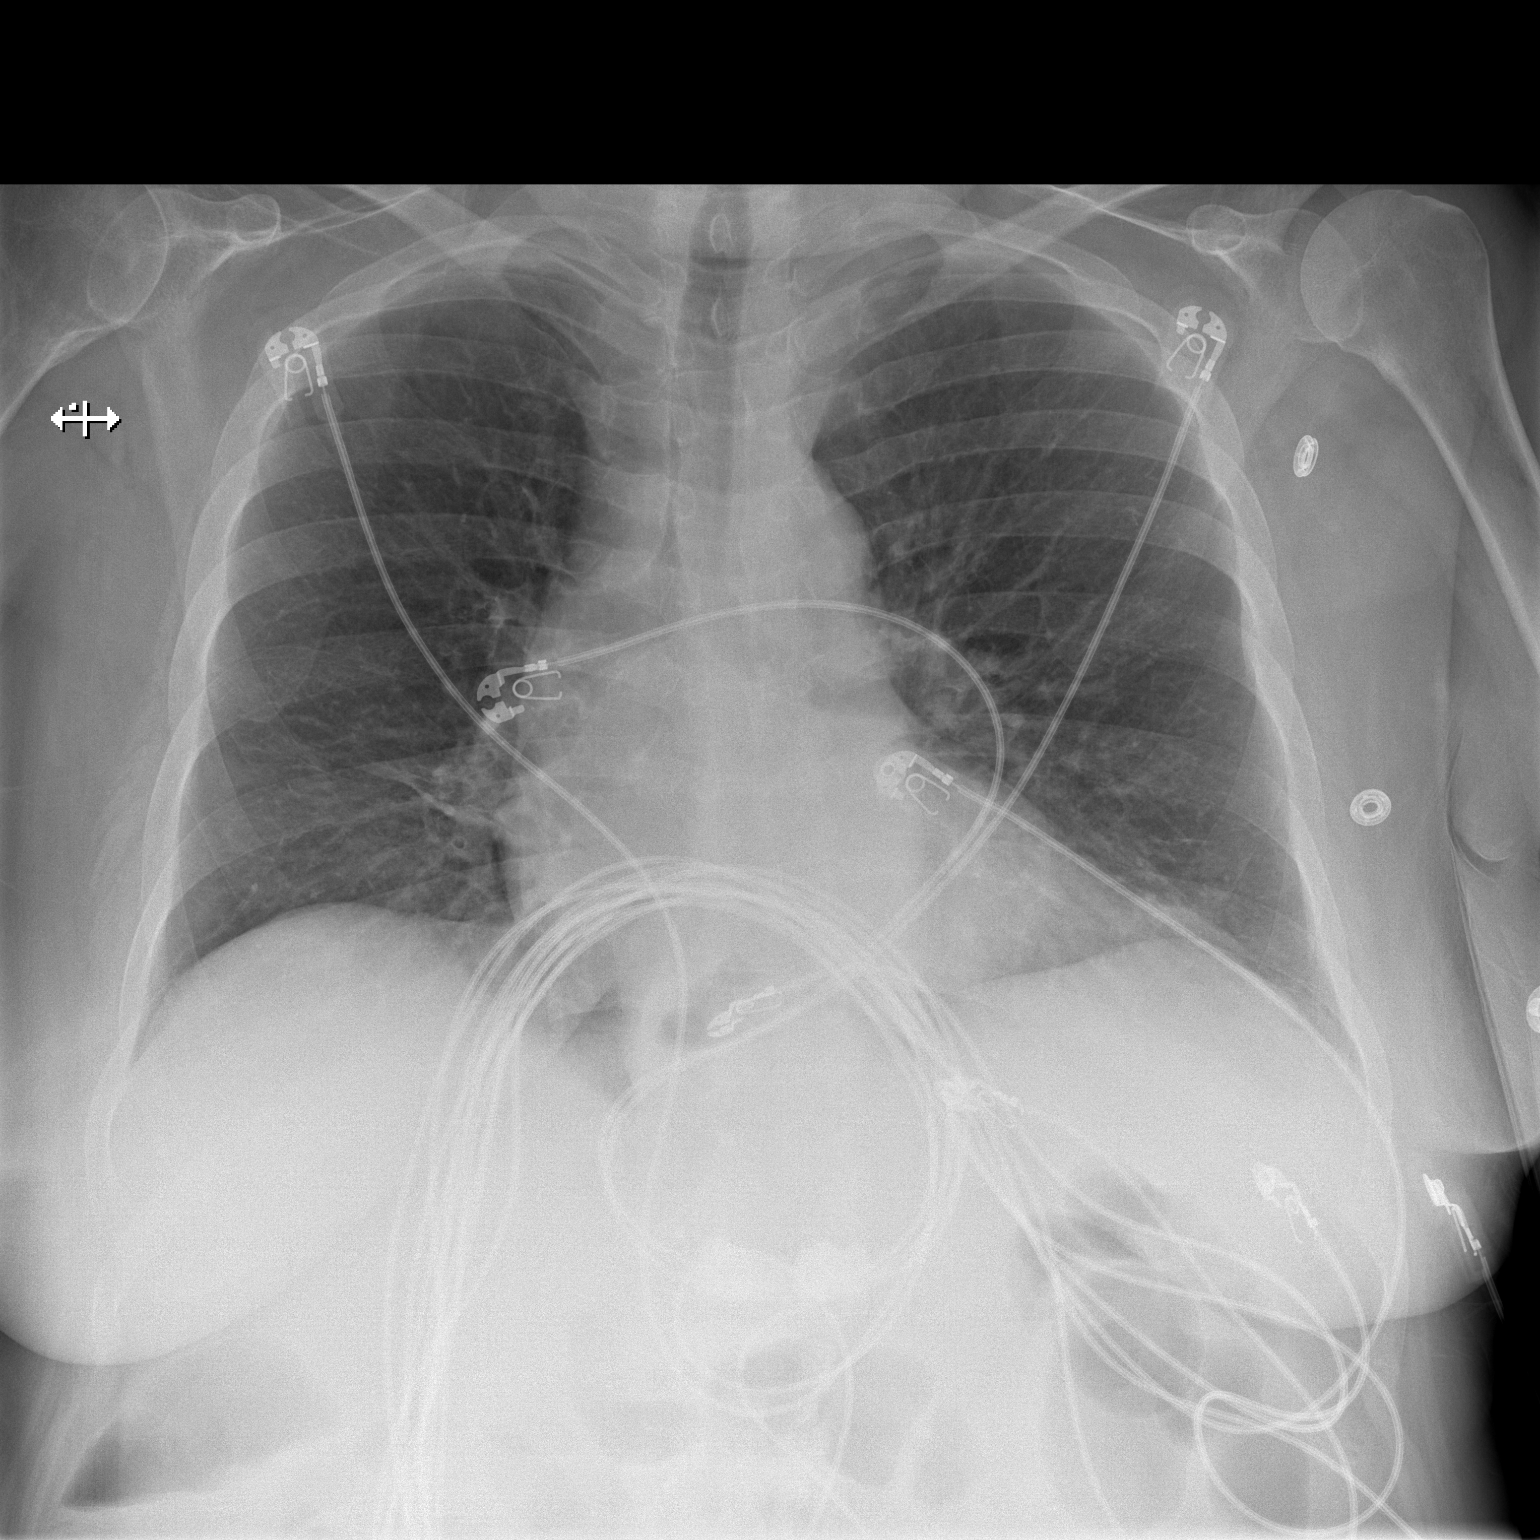

[w chest lat]
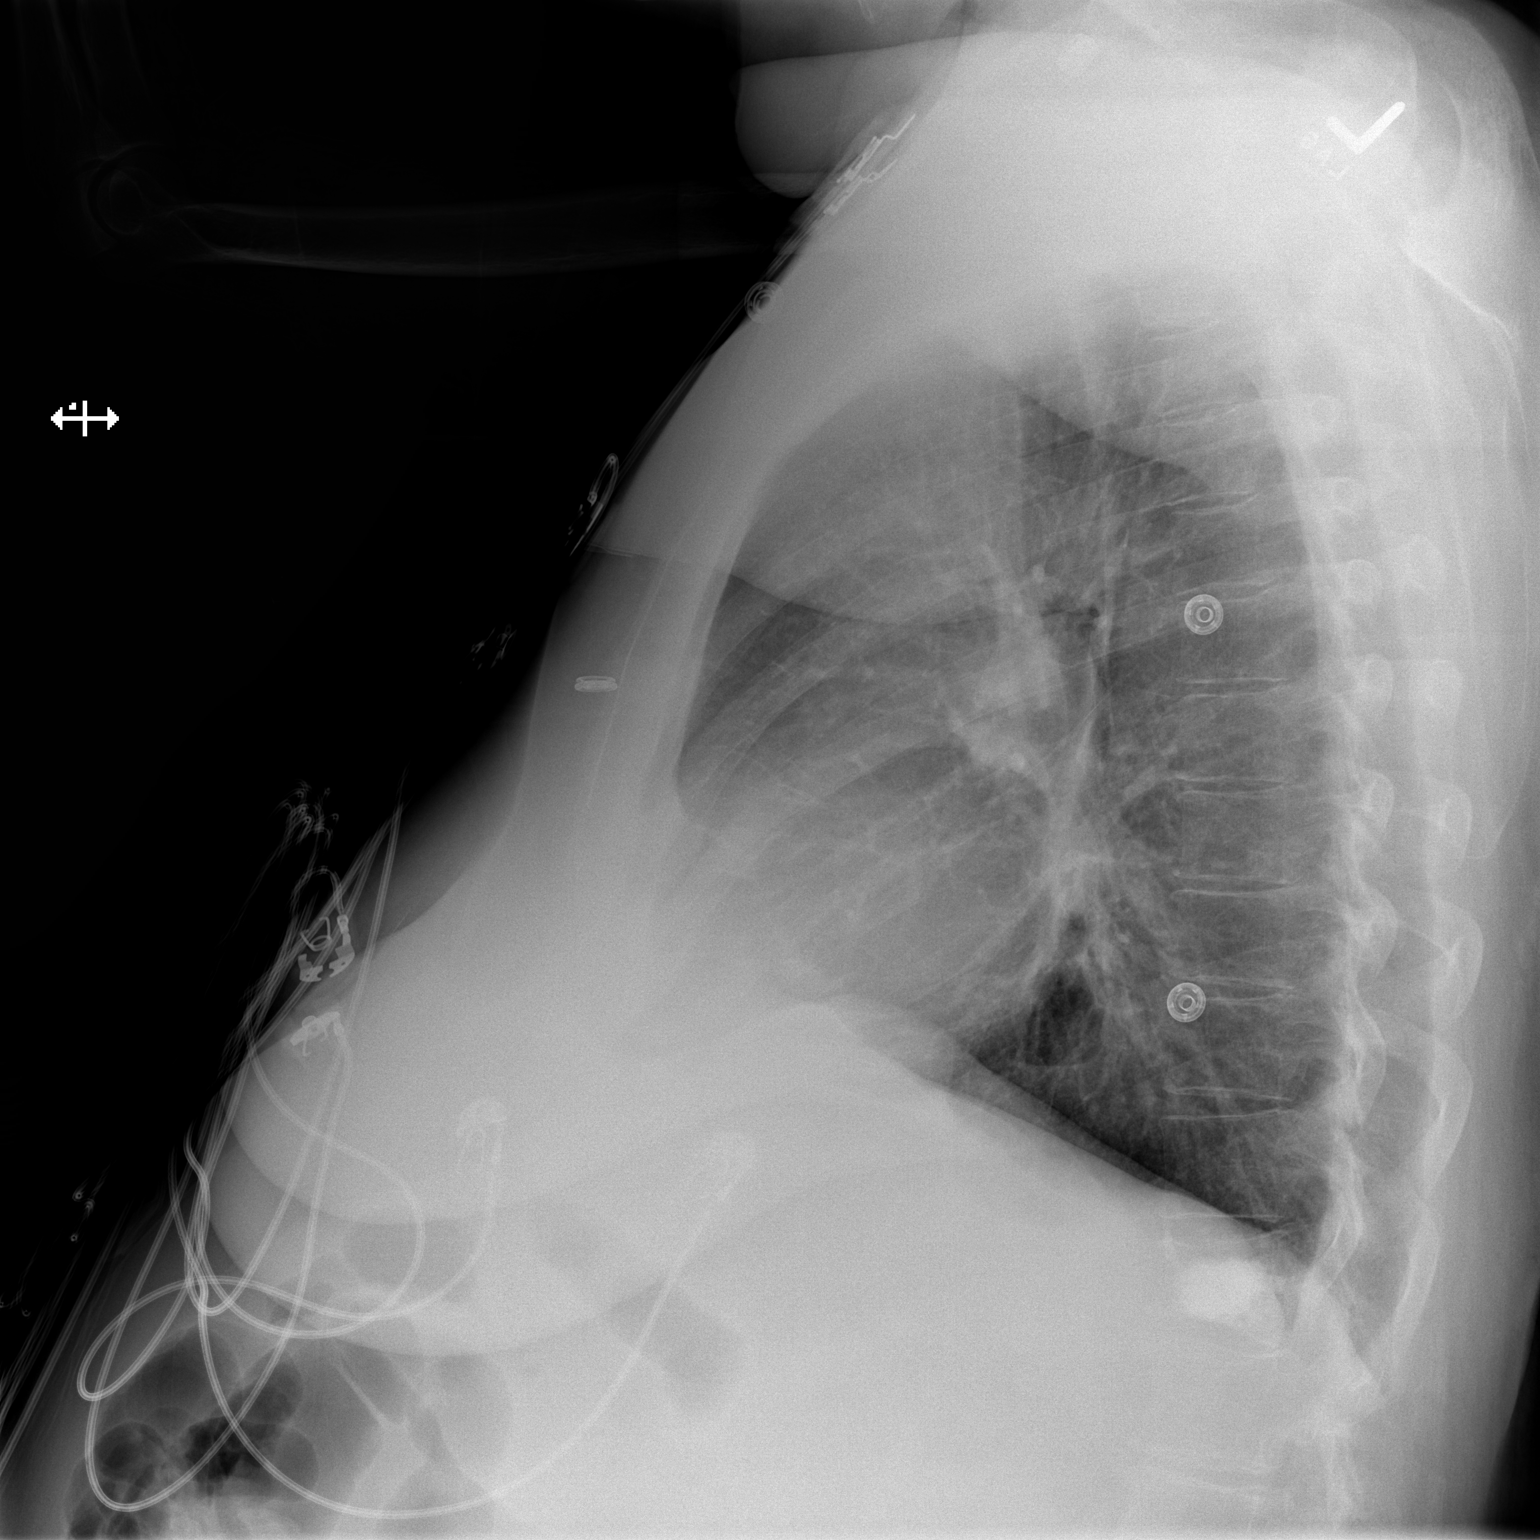

[2 of 2 positions shown; findings below may reference images not displayed]

FINDINGS: The cardiomediastinal contours are normal. Streaky right perihilar
opacity. Pulmonary vasculature is normal. No consolidation, pleural
effusion, or pneumothorax. No acute osseous abnormalities are seen.
Kyphoplasty at the thoracolumbar junction.
IMPRESSION: Streaky right perihilar opacity may reflect atelectasis or scarring.

## 2019-03-17 MED ORDER — HYDROCODONE-ACETAMINOPHEN 5-325 MG PO TABS: 1.0000 | ORAL_TABLET | Freq: Four times a day (QID) | ORAL | 0 refills | Status: DC | PRN

## 2019-03-17 MED ORDER — KETOROLAC TROMETHAMINE 15 MG/ML IJ SOLN
15.0000 mg | Freq: Once | INTRAMUSCULAR | Status: AC
  Administered 2019-03-17: 03:00:00 15 mg via INTRAVENOUS
  Filled 2019-03-17: qty 1

## 2019-03-17 MED ORDER — POTASSIUM CHLORIDE CRYS ER 20 MEQ PO TBCR
40.0000 meq | EXTENDED_RELEASE_TABLET | Freq: Once | ORAL | Status: AC
  Administered 2019-03-17: 03:00:00 40 meq via ORAL
  Filled 2019-03-17: qty 2

## 2019-03-17 NOTE — ED Notes (Signed)
ED Provider at bedside. 

## 2019-03-17 NOTE — ED Notes (Signed)
EKG given to EDP,Molpus,MD., for review. 

## 2019-03-17 NOTE — ED Provider Notes (Addendum)
[facility] DEPT Provider Note: Georgena Spurling, MD, FACEP  CSN: [id number] MRN: [id number] ARRIVAL: 03/16/19 at [place]  Chest Pain   HISTORY OF PRESENT ILLNESS  03/17/19 2:11 AM Rhonda Espinoza is a 58 y.o. female with a 3-day history of chest pain.  The pain is in her right chest just above her right breast.  It is fairly well localized but radiates to her right arm down to her fingers.  She has some paresthesias in her right fingertips.  The pain has been constant but is sometimes worse and sometimes better.  It got "pretty bad" prior to arrival and she called 911.  It is somewhat better now.  Nothing makes the pain better or worse including movement of the neck, movement of the arm, palpation of the chest or deep breathing.  She denies associated shortness of breath, nausea, vomiting or diaphoresis.  She has not had lower extremity pain or swelling.  She denies recent travel.  She has taken 600 mg of ibuprofen and an unspecified muscle relaxant without relief.  She does have a history of chronic pain due to degenerative disc disease as well as T12 compression fracture status post kyphoplasty.    Past Medical History:  Diagnosis Date  . ADD (attention deficit disorder)   . Anxiety   . Arthritis   . Asthma    allergy induced per patient  . Disc disorder   . Hyperlipidemia   . Hypertension   . Pneumonia     Past Surgical History:  Procedure Laterality Date  . CESAREAN SECTION    . FOOT SURGERY Left   . HERNIA REPAIR    . KNEE ARTHROSCOPY    . KYPHOPLASTY N/A 10/13/2018   Procedure: THORACIC 12 KYPHOPLASTY;  Surgeon: Phylliss Bob, MD;  Location: [facility];  Service: Orthopedics;  Laterality: N/A;    No family history on file.  Social History   Tobacco Use  . Smoking status: Never Smoker  . Smokeless tobacco: Never Used  Substance Use Topics  . Alcohol use: Yes    Comment: occasional  . Drug use: Never    Prior to Admission medications    Medication Sig Start Date End Date Taking? Authorizing Provider  acetaminophen (TYLENOL) 325 MG tablet Take 650 mg by mouth every 6 (six) hours as needed for mild pain.    Yes [provider]  albuterol (PROVENTIL HFA;VENTOLIN HFA) 108 (90 Base) MCG/ACT inhaler Inhale 2 puffs into the lungs every 6 (six) hours as needed for wheezing or shortness of breath.   Yes [provider]  ALPRAZolam [name] 0.5 MG tablet Take 0.5 mg by mouth at bedtime as needed for anxiety.  07/09/16  Yes [provider]  aspirin 81 MG EC tablet Take 81 mg by mouth daily.    Yes [provider]  atorvastatin (LIPITOR) 40 MG tablet Take 40 mg by mouth daily.  06/12/16  Yes [provider]  buPROPion (WELLBUTRIN XL) 150 MG 24 hr tablet Take 150 mg by mouth daily.   Yes [provider]  fenofibrate 160 MG tablet Take 160 mg by mouth daily.  06/10/16  Yes [provider]  fluticasone (FLONASE) 50 MCG/ACT nasal spray Place 2 sprays into both nostrils daily as needed for allergies.  09/23/18  Yes [provider]  omeprazole (PRILOSEC) 40 MG capsule Take 40 mg by mouth daily as needed (heartburn).    Yes [provider]  valsartan-hydrochlorothiazide (DIOVAN-HCT) 160-25 MG  tablet Take 1 tablet by mouth daily.   Yes [provider]  Vitamin D, Ergocalciferol, (DRISDOL) 50000 units CAPS capsule Take 50,000 Units by mouth every 7 (seven) days.  03/10/16  Yes [provider]  zolpidem (AMBIEN CR) 12.5 MG CR tablet Take 12.5 mg by mouth at bedtime as needed for sleep.  07/16/16  Yes [provider]  diazepam (VALIUM) 5 MG tablet Take 1 tablet (5 mg total) by mouth as needed for up to 2 doses for anxiety (Take one tab 45 minutes before MRI. Take second tablet just prior to MRI scan). Do not take medication within 4 hours of taking opioid pain medications. Must have a driver. Do not drive or operate machinery x 24 hours after taking this  medication. Patient not taking: Reported on 03/17/2019 08/05/18   Milinda Pointer, MD    Allergies Latex   REVIEW OF SYSTEMS  Negative except as noted here or in the History of Present Illness.   PHYSICAL EXAMINATION  Initial Vital Signs Blood pressure 107/90, pulse 86, temperature 98.3 F (36.8 C), temperature source Oral, resp. rate 16, last menstrual period 06/30/2011, SpO2 96 %.  Examination General: Well-developed, well-nourished female in no acute distress; appearance consistent with age of record HENT: normocephalic; atraumatic Eyes: pupils equal, round and reactive to light; extraocular muscles intact Neck: supple; no change in pain on range of motion Heart: regular rate and rhythm; no murmur Lungs: clear to auscultation bilaterally Abdomen: soft; nondistended; nontender; bowel sounds present Extremities: No deformity; full range of motion; no change in pain on movement of right shoulder; pulses normal; no calf tenderness; no edema Neurologic: Awake, alert and oriented; motor function intact in all extremities and symmetric; no facial droop Skin: Warm and dry Psychiatric: Normal mood and affect   RESULTS  Summary of this visit's results, reviewed by myself:   EKG Interpretation  Date/Time:  Thursday March 17 2019 00:29:33 EDT Ventricular Rate:  77 PR Interval:    QRS Duration: 101 QT Interval:  412 QTC Calculation: 467 R Axis:   -10 Text Interpretation:  Sinus rhythm Nonspecific T abnormalities, anterior leads No significant change was found Confirmed by Shanon Rosser [phone]) on 03/17/2019 12:34:20 AM      Laboratory Studies: Results for orders placed or performed during the hospital encounter of 03/16/19 (from the past 24 hour(s))  Basic metabolic panel     Status: Abnormal   Collection Time: 03/17/19 12:13 AM  Result Value Ref Range   Sodium 134 (L) 135 - 145 mmol/L   Potassium 3.0 (L) 3.5 - 5.1 mmol/L   Chloride 98 98 - 111 mmol/L   CO2 21 (L) 22 - 32  mmol/L   Glucose, Bld 106 (H) 70 - 99 mg/dL   BUN 9 6 - 20 mg/dL   Creatinine, Ser 0.75 0.44 - 1.00 mg/dL   Calcium 8.8 (L) 8.9 - 10.3 mg/dL   GFR calc non Af Amer >60 >60 mL/min   GFR calc Af Amer >60 >60 mL/min   Anion gap 15 5 - 15  CBC     Status: Abnormal   Collection Time: 03/17/19 12:13 AM  Result Value Ref Range   WBC 6.9 4.0 - 10.5 K/uL   RBC 3.37 (L) 3.87 - 5.11 MIL/uL   Hemoglobin 11.3 (L) 12.0 - 15.0 g/dL   HCT 34.8 (L) 36.0 - 46.0 %   MCV 103.3 (H) 80.0 - 100.0 fL   MCH 33.5 26.0 - 34.0 pg   MCHC 32.5 30.0 -  36.0 g/dL   RDW 13.6 11.5 - 15.5 %   Platelets 328 150 - 400 K/uL   nRBC 0.0 0.0 - 0.2 %  Troponin I - ONCE - STAT     Status: None   Collection Time: 03/17/19 12:13 AM  Result Value Ref Range   Troponin I <0.03 <0.03 ng/mL  Troponin I - ONCE - STAT     Status: None   Collection Time: 03/17/19  3:05 AM  Result Value Ref Range   Troponin I <0.03 <0.03 ng/mL   Imaging Studies: Dg Chest 2 View  Result Date: 03/17/2019 CLINICAL DATA:  Chest pain and shortness of breath. EXAM: CHEST - 2 VIEW COMPARISON:  None. FINDINGS: The cardiomediastinal contours are normal. Streaky right perihilar opacity. Pulmonary vasculature is normal. No consolidation, pleural effusion, or pneumothorax. No acute osseous abnormalities are seen. Kyphoplasty at the thoracolumbar junction. IMPRESSION: Streaky right perihilar opacity may reflect atelectasis or scarring. Electronically Signed   By: Keith Rake M.D.   On: 03/17/2019 00:32    ED COURSE and MDM  Nursing notes and initial vitals signs, including pulse oximetry, reviewed.  Vitals:   03/17/19 0130 03/17/19 0200 03/17/19 0300 03/17/19 0330  BP: 123/67 107/90 114/73 120/65  Pulse: 79 86 84 79  Resp: 17 16 (!) 21 20  Temp:      TempSrc:      SpO2: 92% 96% 97% 98%   3:47 AM No change with IV Toradol.  Patient's pain is atypical for cardiac etiology.  There is no exertional component.  Clinical presentation is not consistent  with thromboembolic disease.  She has had 2 negative troponins and an unremarkable EKG.  She plans to contact her PCP later this morning for follow-up.  Hypokalemia likely due to hydrochlorothiazide, she was advised to mention this to her PCP.  PROCEDURES    ED DIAGNOSES     ICD-10-CM   1. Atypical chest pain  R07.89        Shanon Rosser, MD 03/17/19 0350    Shanon Rosser, MD 03/17/19 [id number]

## 2019-05-24 ENCOUNTER — Other Ambulatory Visit: Payer: Self-pay | Admitting: Physician Assistant

## 2020-05-08 ENCOUNTER — Other Ambulatory Visit: Payer: Self-pay

## 2020-05-08 ENCOUNTER — Emergency Department (HOSPITAL_BASED_OUTPATIENT_CLINIC_OR_DEPARTMENT_OTHER): Payer: BC Managed Care – PPO

## 2020-05-08 ENCOUNTER — Emergency Department (HOSPITAL_BASED_OUTPATIENT_CLINIC_OR_DEPARTMENT_OTHER)
Admission: EM | Admit: 2020-05-08 | Discharge: 2020-05-08 | Disposition: A | Discharge status: Home / Self Care | Payer: BC Managed Care – PPO | Attending: Emergency Medicine | Admitting: Emergency Medicine

## 2020-05-08 ENCOUNTER — Encounter (HOSPITAL_BASED_OUTPATIENT_CLINIC_OR_DEPARTMENT_OTHER): Payer: Self-pay | Admitting: *Deleted

## 2020-05-08 DIAGNOSIS — Z91040 Latex allergy status: Secondary | ICD-10-CM | POA: Diagnosis not present

## 2020-05-08 DIAGNOSIS — I10 Essential (primary) hypertension: Secondary | ICD-10-CM | POA: Insufficient documentation

## 2020-05-08 DIAGNOSIS — J45909 Unspecified asthma, uncomplicated: Secondary | ICD-10-CM | POA: Insufficient documentation

## 2020-05-08 DIAGNOSIS — K859 Acute pancreatitis without necrosis or infection, unspecified: Secondary | ICD-10-CM | POA: Diagnosis not present

## 2020-05-08 DIAGNOSIS — R109 Unspecified abdominal pain: Secondary | ICD-10-CM | POA: Diagnosis present

## 2020-05-08 LAB — COMPREHENSIVE METABOLIC PANEL
ALT: 36 U/L (ref 0–44)
AST: 54 U/L — ABNORMAL HIGH (ref 15–41)
Albumin: 3.9 g/dL (ref 3.5–5.0)
Alkaline Phosphatase: 61 U/L (ref 38–126)
Anion gap: 10 (ref 5–15)
BUN: 9 mg/dL (ref 6–20)
CO2: 25 mmol/L (ref 22–32)
Calcium: 9.1 mg/dL (ref 8.9–10.3)
Chloride: 98 mmol/L (ref 98–111)
Creatinine, Ser: 0.73 mg/dL (ref 0.44–1.00)
GFR calc Af Amer: >60 mL/min (ref >60)
GFR calc non Af Amer: >60 mL/min (ref >60)
Glucose, Bld: 104 mg/dL — ABNORMAL HIGH (ref 70–99)
Potassium: 4.2 mmol/L (ref 3.5–5.1)
Sodium: 133 mmol/L — ABNORMAL LOW (ref 135–145)
Total Bilirubin: 0.4 mg/dL (ref 0.3–1.2)
Total Protein: 6.9 g/dL (ref 6.5–8.1)

## 2020-05-08 LAB — CBC WITH DIFFERENTIAL/PLATELET
Abs Immature Granulocytes: 0.04 10*3/uL (ref 0.00–0.07)
Basophils Absolute: 0 10*3/uL (ref 0.0–0.1)
Basophils Relative: 0 %
Eosinophils Absolute: 0.1 10*3/uL (ref 0.0–0.5)
Eosinophils Relative: 1 %
HCT: 35.3 % — ABNORMAL LOW (ref 36.0–46.0)
Hemoglobin: 12 g/dL (ref 12.0–15.0)
Immature Granulocytes: 0 %
Lymphocytes Relative: 17 %
Lymphs Abs: 1.7 10*3/uL (ref 0.7–4.0)
MCH: 33.7 pg (ref 26.0–34.0)
MCHC: 34 g/dL (ref 30.0–36.0)
MCV: 99.2 fL (ref 80.0–100.0)
Monocytes Absolute: 0.6 10*3/uL (ref 0.1–1.0)
Monocytes Relative: 6 %
Neutro Abs: 7.6 10*3/uL (ref 1.7–7.7)
Neutrophils Relative %: 76 %
Platelets: 348 10*3/uL (ref 150–400)
RBC: 3.56 MIL/uL — ABNORMAL LOW (ref 3.87–5.11)
RDW: 12.7 % (ref 11.5–15.5)
WBC: 10.1 10*3/uL (ref 4.0–10.5)
nRBC: 0 % (ref 0.0–0.2)

## 2020-05-08 LAB — URINALYSIS, MICROSCOPIC (REFLEX)

## 2020-05-08 LAB — URINALYSIS, ROUTINE W REFLEX MICROSCOPIC
Bilirubin Urine: NEGATIVE
Glucose, UA: 100 mg/dL — AB
Hgb urine dipstick: NEGATIVE
Ketones, ur: NEGATIVE mg/dL
Nitrite: NEGATIVE
Protein, ur: NEGATIVE mg/dL
Specific Gravity, Urine: 1.01 (ref 1.005–1.030)
pH: 6.5 (ref 5.0–8.0)

## 2020-05-08 LAB — LIPASE, BLOOD: Lipase: 64 U/L — ABNORMAL HIGH (ref 11–51)

## 2020-05-08 IMAGING — CT CT ABD-PELV W/ CM
2 of 5 series · 16 of 46 positions shown, 18 images · IV contrast (Omnipaque)
Comparison: None.

CLINICAL DATA: Epigastric abdominal pain.  Ulcers.  Hiatal hernia.

EXAM:
CT ABDOMEN AND PELVIS WITH CONTRAST
TECHNIQUE: Multidetector CT imaging of the abdomen and pelvis was performed
using the standard protocol following bolus administration of
intravenous contrast.
CONTRAST:  100mL OMNIPAQUE IOHEXOL 300 MG/ML  SOLN

[Series 2: axial st · axial · 0.93mm/px · z∈[-689,-279]mm · 13 of 92 slices shown, 15 images]
[im 5/92  soft-tissue]
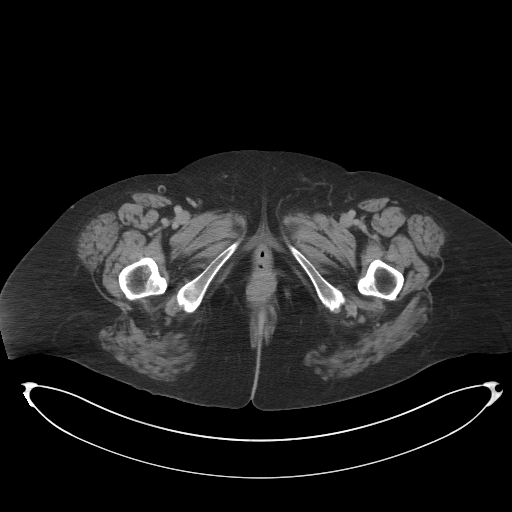
[im 5/92  bone]
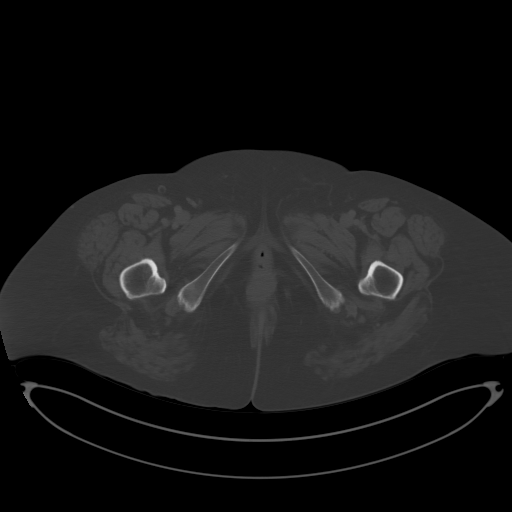
[im 14/92  soft-tissue]
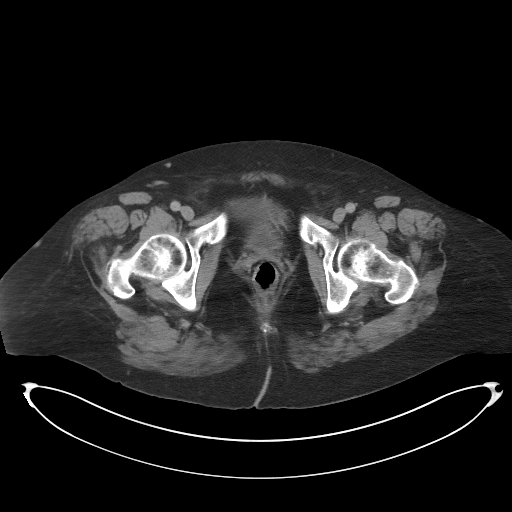
[im 19/92  soft-tissue]
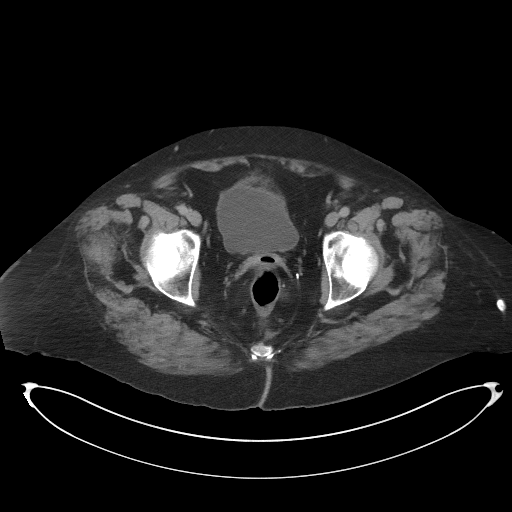
[im 28/92  soft-tissue]
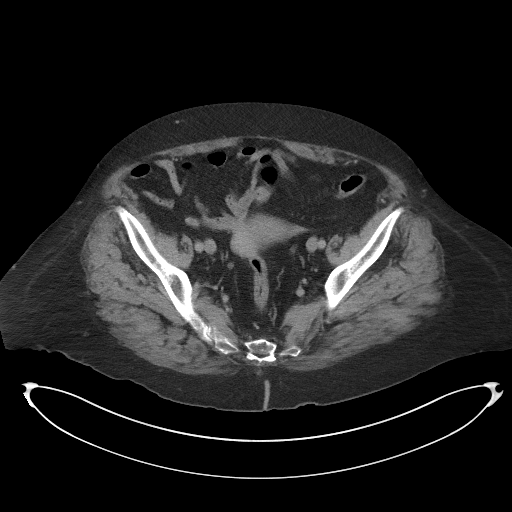
[im 32/92  soft-tissue]
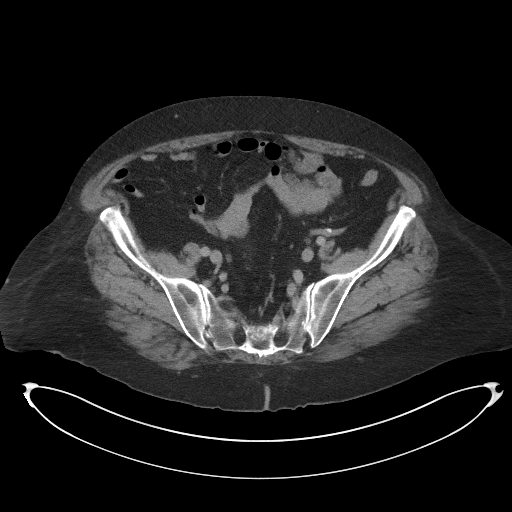
[im 41/92  soft-tissue]
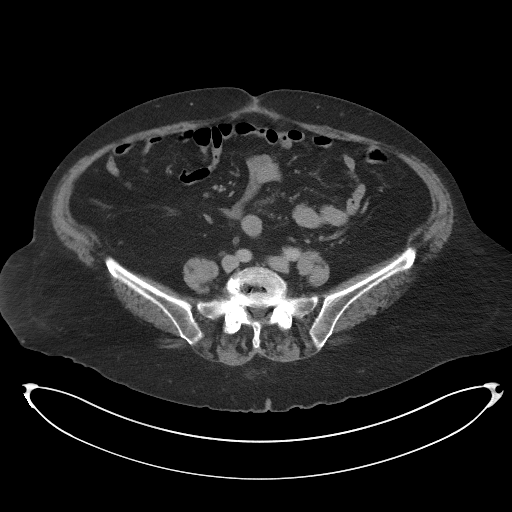
[im 46/92  soft-tissue]
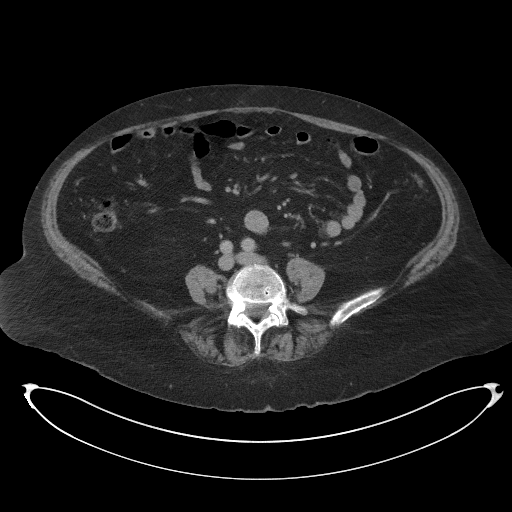
[im 51/92  soft-tissue]
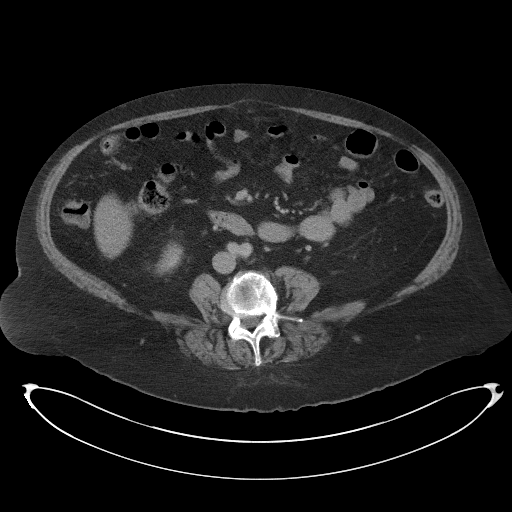
[im 60/92  soft-tissue]
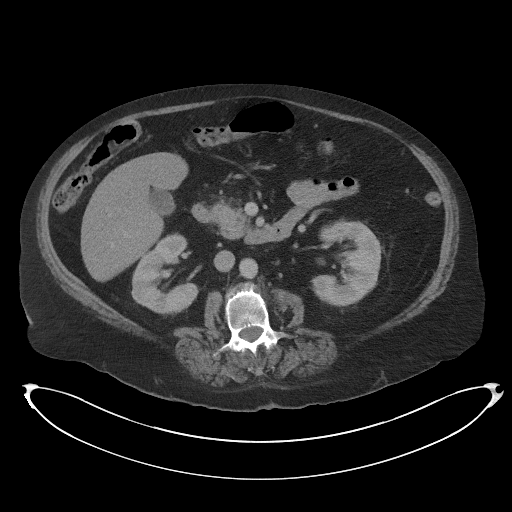
[im 60/92  bone]
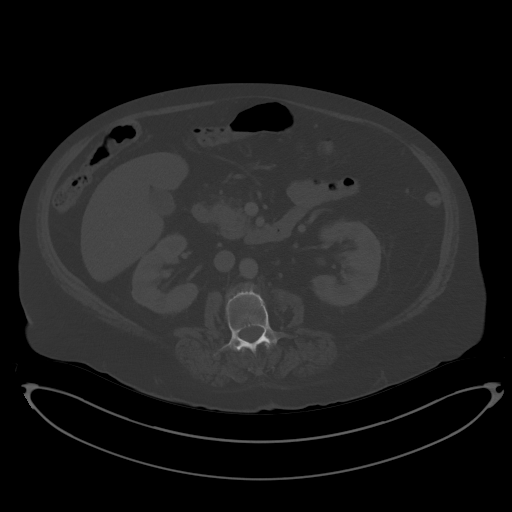
[im 64/92  soft-tissue]
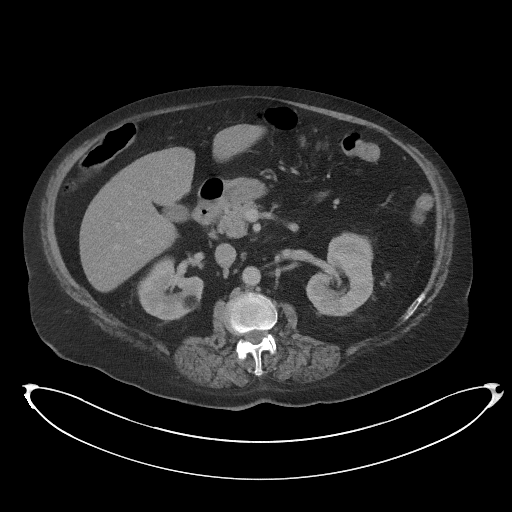
[im 73/92  soft-tissue]
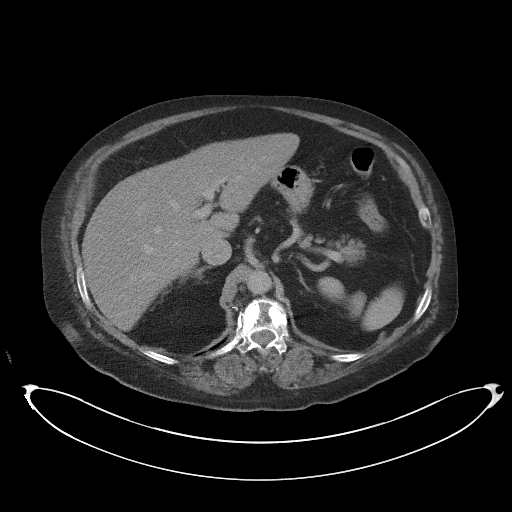
[im 78/92  soft-tissue]
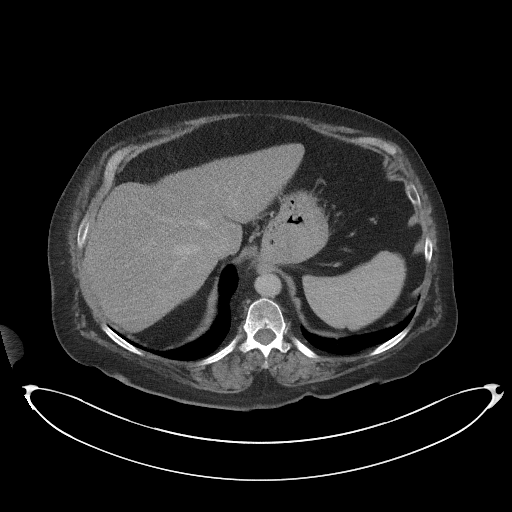
[im 87/92  soft-tissue]
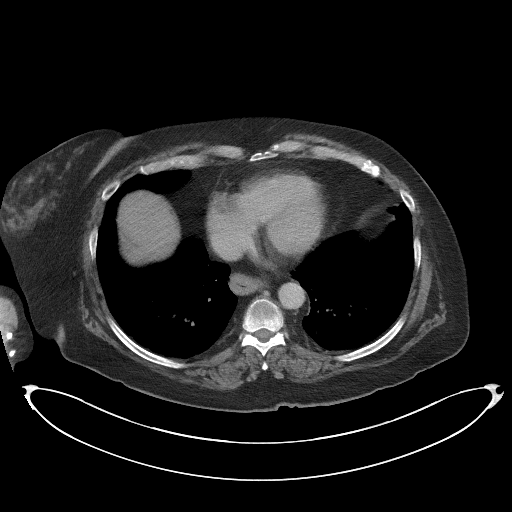

[Series 5: coronal st · coronal · 0.93mm/px · 3 of 111 slices shown]
[im 37/111  soft-tissue]
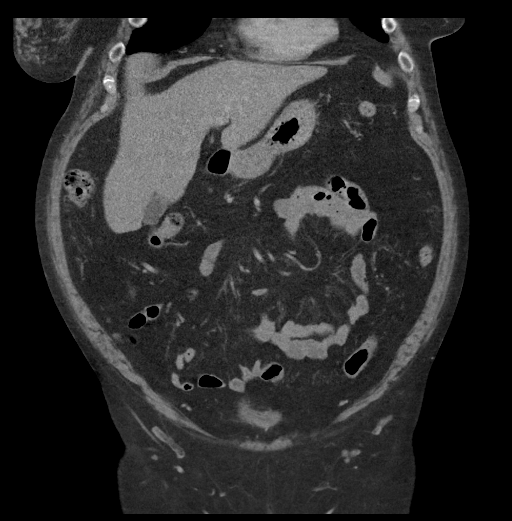
[im 49/111  soft-tissue]
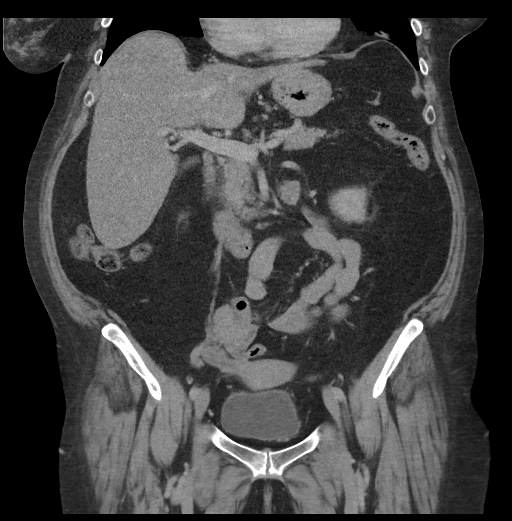
[im 62/111  soft-tissue]
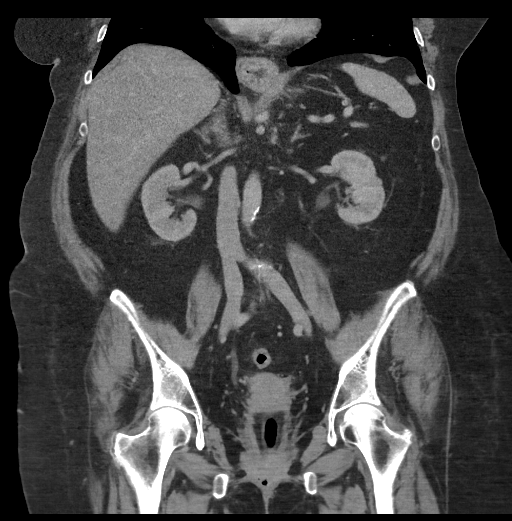

[16 of 46 positions shown; findings below may reference images not displayed]

FINDINGS: Lower chest: Linear scarring in the lingula. Subsegmental
atelectasis or scarring both lower lobes. Small type 1 hiatal
hernia.

Hepatobiliary: 1.0 cm hyperdensity segment 4b of the liver adjacent
to the gallbladder fossa on image 72/5, roughly similar appearance
on [DATE], possibly from focal spotty sparing technically
nonspecific. Otherwise unremarkable.

Pancreas: Mild stranding along the pancreaticoduodenal groove and
margins of the pancreatic head inferiorly suspicious for focal
pancreatitis. Otherwise occult gastroduodenal ulcer may be an
alternative cause for this low-grade inflammatory appearance.

Spleen: Unremarkable

Adrenals/Urinary Tract: Both adrenal glands appear normal.

2.0 by 1.7 cm angiomyolipoma of the right kidney upper pole, new
compared to [DATE].

Stomach/Bowel: As noted above there is low-grade stranding along the
pancreaticoduodenal groove. Normal appendix.

Vascular/Lymphatic: Aortoiliac atherosclerotic vascular disease.

Reproductive: Lobularity along the right uterine body favoring
uterine fibroid.

Other: No supplemental non-categorized findings.

Musculoskeletal: Small supraumbilical hernia contains adipose tissue
as shown on image 71/6.

Sclerosis distally in the left seventh rib likely from a healed rib
fracture.

Dextroconvex lumbar scoliosis with rotary component.

Prior wedge compression at T12 with vertebral augmentation. We
partially include a small sclerotic lesion in the T8 vertebral body,
nonspecific. Lumbar spondylosis and degenerative disc disease result
in right foraminal impingement L4-5 and L5-S1, and left foraminal
impingement at L2-3, L3-4, and L4-5. Grade 1 degenerative
retrolisthesis at L4-5.
IMPRESSION: 1. Mild stranding along the pancreaticoduodenal groove and margins
of the pancreatic head inferiorly suspicious for focal pancreatitis.
Otherwise occult gastroduodenal ulcer may be an alternative cause
for this low-grade inflammatory appearance.
2. New right kidney upper pole 2.0 by 1.7 cm angiomyolipoma. This is
at the borderline (2.0 cm diameter) for which surveillance might be
suggested. Consider follow up CT or MRI in 2 years time.
3. Other imaging findings of potential clinical significance: Small
type 1 hiatal hernia. Small supraumbilical hernia contains adipose
tissue. Lumbar spondylosis and degenerative disc disease causing
multilevel impingement. Dextroconvex lumbar scoliosis with rotary
component. Prior wedge compression at T12 with vertebral
augmentation.
4. Aortic atherosclerosis.

Aortic Atherosclerosis ([LT]-[LT]).

## 2020-05-08 MED ORDER — IOHEXOL 300 MG/ML  SOLN
100.0000 mL | Freq: Once | INTRAMUSCULAR | Status: AC | PRN
  Administered 2020-05-08: 100 mL via INTRAVENOUS

## 2020-05-08 MED ORDER — HYDROCODONE-ACETAMINOPHEN 5-325 MG PO TABS: 1.0000 | ORAL_TABLET | Freq: Four times a day (QID) | ORAL | 0 refills | Status: DC | PRN

## 2020-05-08 MED ORDER — SODIUM CHLORIDE 0.9 % IV BOLUS
1000.0000 mL | Freq: Once | INTRAVENOUS | Status: AC
  Administered 2020-05-08: 1000 mL via INTRAVENOUS

## 2020-05-08 MED ORDER — ONDANSETRON 4 MG PO TBDP: 4.0000 mg | ORAL_TABLET | Freq: Three times a day (TID) | ORAL | 0 refills | Status: DC | PRN

## 2020-05-08 MED ORDER — MORPHINE SULFATE (PF) 4 MG/ML IV SOLN
4.0000 mg | Freq: Once | INTRAVENOUS | Status: AC
  Administered 2020-05-08: 4 mg via INTRAVENOUS
  Filled 2020-05-08: qty 1

## 2020-05-08 MED ORDER — ALUM & MAG HYDROXIDE-SIMETH 200-200-20 MG/5ML PO SUSP
30.0000 mL | Freq: Once | ORAL | Status: AC
  Administered 2020-05-08: 30 mL via ORAL
  Filled 2020-05-08: qty 30

## 2020-05-08 MED ORDER — PANTOPRAZOLE SODIUM 40 MG IV SOLR
40.0000 mg | Freq: Once | INTRAVENOUS | Status: AC
  Administered 2020-05-08: 40 mg via INTRAVENOUS
  Filled 2020-05-08: qty 40

## 2020-05-08 NOTE — ED Notes (Signed)
ED Provider at bedside. 

## 2020-05-08 NOTE — ED Notes (Signed)
Pt ambulated to RR unassisted 

## 2020-05-08 NOTE — ED Notes (Signed)
Pt ambulated to bathroom without difficulty.

## 2020-05-08 NOTE — ED Triage Notes (Signed)
Abdominal pain. Recent endo that showed ulcers and hiatal hernia.

## 2020-05-08 NOTE — Discharge Instructions (Addendum)
Please increase oral hydration.  Take your pain medications as needed. I would also like you to take Zofran should you develop nausea symptoms.  Please read the attachment on acute pancreatitis.  You'll need to follow-up with your primary care provider as well as your gastroenterologist, Dr. Collene Mares.   Please return to the ED or seek immediate medical attention should experience any new or worsening symptoms.

## 2020-05-08 NOTE — ED Provider Notes (Addendum)
[facility] EMERGENCY DEPARTMENT Provider Note   CSN: [id number] Arrival date & time: 05/08/20  1208     History Chief Complaint  Patient presents with  . Abdominal Pain    Rhonda Espinoza is a 59 y.o. female with PMH sniffing for HTN, HLD, anxiety, and chronic pain syndrome presents to the ED with complaints of abdominal pain in context of recent peptic ulcers and hiatal hernia on upper endoscopy.  I tried to access patient's endoscopy 04/20/2020 on Care Everywhere, however unable to view the note.  This was completed by a [place] health provider.  Patient was evaluated today by her primary care provider, Dr. Melford Aase, who recommended that patient go to the ED for evaluation given her recent diagnosis of esophageal ulcers and history of pancreatitis.  On my exam, patient states that she has been taking her omeprazole and supinate at home religiously.  She states that she felt fine yesterday and at approximately 3 AM this morning she developed epigastric abdominal pain that feels different from her history of GERD.  She is unaware of any fevers or chills, but states that she has had diminished appetite for weeks.  She lost her mother approximately 1 month ago and since then has had diminished p.o. intake.  She does have a history of recurrent pancreatitis which she states has unclear etiology.  She states that her imaging and work-up for prior episodes has all been done at [facility].  Abdominal surgical history including umbilical hernia surgery and C-section.  Patient states that her last bowel movement was yesterday and normal.  She also endorses increased urinary frequency, but no dysuria and feels as though that is common for her given her increasing age.  She denies any back pain, nausea or vomiting, or any obvious fever/chills.  Patient also denies any melena.    HPI     Past Medical History:  Diagnosis Date  . ADD (attention deficit disorder)   . Anxiety   . Arthritis   . Asthma     allergy induced per patient  . Disc disorder   . Hyperlipidemia   . Hypertension   . Pneumonia     Patient Active Problem List   Diagnosis Date Noted  . History of T12 kyphoplasty 01/24/2019  . Hip pain, acute, right 01/16/2019  . Acute leg pain, right 01/16/2019  . Sciatica of right side without back pain 01/16/2019  . Latex precautions, history of latex allergy 11/02/2018  . Lesion of right native kidney 08/30/2018  . Abnormal MRI, lumbar spine (08/14/2018) 08/23/2018  . Lumbar lateral recess stenosis (Left: L2-3) (Bilateral: L3-4) (Right: L4-5) 08/23/2018  . Lumbar Grade 1 Retrolisthesis of L2/L3, L3/L4, & L4/L5 08/23/2018  . Grade 1 Lumbar Anterolisthesis of L5/S1 08/23/2018  . Lumbar facet hypertrophy (Multilevel) (Bilateral) 08/19/2018  . Lumbar foraminal stenosis (Left: L2-3, L3-4) (Bilateral: L4-5, L5-S1) 08/19/2018  . Lumbar central spinal stenosis 08/19/2018  . DDD (degenerative disc disease), lumbar 08/05/2018  . Traumatic compression fracture of T12 (<15%) thoracic vertebra, sequela 08/05/2018  . Abnormal x-ray of lumbar spine (08/02/2018) 08/05/2018  . Disorder of skeletal system 08/05/2018  . Pharmacologic therapy 08/05/2018  . Problems influencing health status 08/05/2018  . Chronic pain syndrome 08/05/2018  . Class 1 obesity with body mass index (BMI) of 33.0 to 33.9 in adult 05/24/2018  . Seasonal allergic rhinitis due to pollen 02/03/2017  . Chronic low back pain (Bilateral) (R>L) 08/04/2016  . Long term current use of opiate analgesic 08/04/2016  .  Long term prescription opiate use 08/04/2016  . Opiate use 08/04/2016  . Spondylosis without myelopathy or radiculopathy, lumbar region 08/04/2016  . ADD (attention deficit disorder) 05/22/2016  . GAD (generalized anxiety disorder) 05/22/2016  . Hypertension, essential 05/22/2016  . Panic attacks 05/22/2016  . Essential hypertension 08/30/2015  . Back muscle spasm 01/31/2014  . Abnormal LFTs 01/31/2014  . Blood  glucose elevated 01/31/2014  . Insomnia 01/31/2014  . Lumbar facet syndrome (Bilateral) (R>L) 01/31/2014  . Elevated LFTs 01/31/2014  . Avitaminosis D 07/03/2011  . Vitamin D deficiency 07/03/2011  . HLD (hyperlipidemia) 06/23/2011  . Hypertriglyceridemia 06/23/2011    Past Surgical History:  Procedure Laterality Date  . CESAREAN SECTION    . FOOT SURGERY Left   . HERNIA REPAIR    . KNEE ARTHROSCOPY    . KYPHOPLASTY N/A 10/13/2018   Procedure: THORACIC 12 KYPHOPLASTY;  Surgeon: Phylliss Bob, MD;  Location: [facility];  Service: Orthopedics;  Laterality: N/A;     OB History   No obstetric history on file.     No family history on file.  Social History   Tobacco Use  . Smoking status: Never Smoker  . Smokeless tobacco: Never Used  Vaping Use  . Vaping Use: Never used  Substance Use Topics  . Alcohol use: Yes    Comment: occasional  . Drug use: Never    Home Medications Prior to Admission medications   Medication Sig Start Date End Date Taking? Authorizing Provider  ALPRAZolam [name] 0.5 MG tablet Take 0.5 mg by mouth at bedtime as needed for anxiety.  07/09/16  Yes [provider]  atorvastatin (LIPITOR) 40 MG tablet Take 40 mg by mouth daily.  06/12/16  Yes [provider]  desvenlafaxine (PRISTIQ) 50 MG 24 hr tablet Take 50 mg by mouth daily. 05/04/20  Yes [provider]  fenofibrate 160 MG tablet Take 160 mg by mouth daily.  06/10/16  Yes [provider]  omeprazole (PRILOSEC) 40 MG capsule Take 40 mg by mouth daily as needed (heartburn).    Yes [provider]  sucralfate (CARAFATE) 1 GM/10ML suspension Take 10 mLs by mouth in the morning, at noon, in the evening, and at bedtime.  04/21/20  Yes [provider]  tiZANidine (ZANAFLEX) 4 MG tablet Take 4 mg by mouth at bedtime as needed for muscle spasms.  04/26/20  Yes [provider]  valsartan-hydrochlorothiazide (DIOVAN-HCT) 160-25 MG tablet Take 1 tablet by  mouth daily.   Yes [provider]  Vitamin D, Ergocalciferol, (DRISDOL) 50000 units CAPS capsule Take 50,000 Units by mouth every 7 (seven) days.  03/10/16  Yes [provider]  zolpidem (AMBIEN CR) 12.5 MG CR tablet Take 12.5 mg by mouth at bedtime as needed for sleep.  07/16/16  Yes [provider]  HYDROcodone-acetaminophen (NORCO/VICODIN) 5-325 MG tablet Take 1 tablet by mouth every 6 (six) hours as needed for severe pain. 05/08/20   Corena Herter, PA-C  ondansetron (ZOFRAN ODT) 4 MG disintegrating tablet Take 1 tablet (4 mg total) by mouth every 8 (eight) hours as needed for nausea or vomiting. 05/08/20   Corena Herter, PA-C    Allergies    Latex  Review of Systems   Review of Systems  All other systems reviewed and are negative.   Physical Exam Updated Vital Signs BP (!) 155/82 (BP Location: Right Arm)   Pulse 81   Temp 98.1 F (36.7 C) (Oral)   Resp 18   Ht 5'  8" (1.727 m)   Wt 94.3 kg   LMP 06/30/2011   SpO2 100%   BMI 31.63 kg/m   Physical Exam Vitals and nursing note reviewed. Exam conducted with a chaperone present.  Constitutional:      General: She is not in acute distress.    Appearance: Normal appearance.  HENT:     Head: Normocephalic and atraumatic.  Eyes:     General: No scleral icterus.    Conjunctiva/sclera: Conjunctivae normal.  Cardiovascular:     Rate and Rhythm: Normal rate and regular rhythm.     Pulses: Normal pulses.     Heart sounds: Normal heart sounds.  Pulmonary:     Effort: Pulmonary effort is normal. No respiratory distress.     Breath sounds: Normal breath sounds. No wheezing or rales.  Abdominal:     Comments: Soft, nondistended.  TTP in epigastric region.  No significant TTP elsewhere.  No guarding.  No overlying skin changes.  Musculoskeletal:     Cervical back: Normal range of motion. No rigidity.     Right lower leg: No edema.     Left lower leg: No edema.  Skin:    General: Skin is dry.      Capillary Refill: Capillary refill takes less than 2 seconds.  Neurological:     Mental Status: She is alert and oriented to person, place, and time.     GCS: GCS eye subscore is 4. GCS verbal subscore is 5. GCS motor subscore is 6.  Psychiatric:        Mood and Affect: Mood normal.        Behavior: Behavior normal.        Thought Content: Thought content normal.     ED Results / Procedures / Treatments   Labs (all labs ordered are listed, but only abnormal results are displayed) Labs Reviewed  URINALYSIS, ROUTINE W REFLEX MICROSCOPIC - Abnormal; Notable for the following components:      Result Value   Glucose, UA 100 (*)    Leukocytes,Ua SMALL (*)    All other components within normal limits  URINALYSIS, MICROSCOPIC (REFLEX) - Abnormal; Notable for the following components:   Bacteria, UA FEW (*)    All other components within normal limits  COMPREHENSIVE METABOLIC PANEL - Abnormal; Notable for the following components:   Sodium 133 (*)    Glucose, Bld 104 (*)    AST 54 (*)    All other components within normal limits  CBC WITH DIFFERENTIAL/PLATELET - Abnormal; Notable for the following components:   RBC 3.56 (*)    HCT 35.3 (*)    All other components within normal limits  LIPASE, BLOOD - Abnormal; Notable for the following components:   Lipase 64 (*)    All other components within normal limits    EKG None  Radiology CT ABDOMEN PELVIS W CONTRAST  Result Date: 05/08/2020 CLINICAL DATA:  Epigastric abdominal pain.  Ulcers.  Hiatal hernia. EXAM: CT ABDOMEN AND PELVIS WITH CONTRAST TECHNIQUE: Multidetector CT imaging of the abdomen and pelvis was performed using the standard protocol following bolus administration of intravenous contrast. CONTRAST:  147mL OMNIPAQUE IOHEXOL 300 MG/ML  SOLN COMPARISON:  None. FINDINGS: Lower chest: Linear scarring in the lingula. Subsegmental atelectasis or scarring both lower lobes. Small type 1 hiatal hernia. Hepatobiliary: 1.0 cm  hyperdensity segment 4b of the liver adjacent to the gallbladder fossa on image 72/5, roughly similar appearance on 05/21/2007, possibly from focal spotty sparing technically nonspecific. Otherwise unremarkable.  Pancreas: Mild stranding along the pancreaticoduodenal groove and margins of the pancreatic head inferiorly suspicious for focal pancreatitis. Otherwise occult gastroduodenal ulcer may be an alternative cause for this low-grade inflammatory appearance. Spleen: Unremarkable Adrenals/Urinary Tract: Both adrenal glands appear normal. 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 Stomach/Bowel: As noted above there is low-grade stranding along the pancreaticoduodenal groove. Normal appendix. Vascular/Lymphatic: Aortoiliac atherosclerotic vascular disease. Reproductive: Lobularity along the right uterine body favoring uterine fibroid. Other: No supplemental non-categorized findings. Musculoskeletal: Small supraumbilical hernia contains adipose tissue as shown on image 71/6. Sclerosis distally in the left seventh rib likely from a healed rib fracture. Dextroconvex lumbar scoliosis with rotary component. Prior wedge compression at T12 with vertebral augmentation. We partially include a small sclerotic lesion in the T8 vertebral body, nonspecific. Lumbar spondylosis and degenerative disc disease result in right foraminal impingement L4-5 and L5-S1, and left foraminal impingement at L2-3, L3-4, and L4-5. Grade 1 degenerative retrolisthesis at L4-5. IMPRESSION: 1. Mild stranding along the pancreaticoduodenal groove and margins of the pancreatic head inferiorly suspicious for focal pancreatitis. Otherwise occult gastroduodenal ulcer may be an alternative cause for this low-grade inflammatory appearance. 2. 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 This is at the borderline (2.0 cm diameter) for which surveillance might be suggested. Consider follow up CT or MRI  in 2 years time. 3. Other imaging findings of potential clinical significance: Small type 1 hiatal hernia. Small supraumbilical hernia contains adipose tissue. Lumbar spondylosis and degenerative disc disease causing multilevel impingement. Dextroconvex lumbar scoliosis with rotary component. Prior wedge compression at T12 with vertebral augmentation. 4. Aortic atherosclerosis. Aortic Atherosclerosis (ICD10-I70.0). Electronically Signed   By: Van Clines M.D.   On: 05/08/2020 17:09    Procedures Procedures (including critical care time)  Medications Ordered in ED Medications  sodium chloride 0.9 % bolus 1,000 mL (0 mLs Intravenous Stopped 05/08/20 1700)  pantoprazole (PROTONIX) injection 40 mg (40 mg Intravenous Given 05/08/20 1544)  alum & mag hydroxide-simeth (MAALOX/MYLANTA) 200-200-20 MG/5ML suspension 30 mL (30 mLs Oral Given 05/08/20 1539)  morphine 4 MG/ML injection 4 mg (4 mg Intravenous Given 05/08/20 1653)  iohexol (OMNIPAQUE) 300 MG/ML solution 100 mL (100 mLs Intravenous Contrast Given 05/08/20 1641)    ED Course  I have reviewed the triage vital signs and the nursing notes.  Pertinent labs & imaging results that were available during my care of the patient were reviewed by me and considered in my medical decision making (see chart for details).    MDM Rules/Calculators/A&P                          I reviewed patient's medical record and on 08/28/2019 patient had CT abdomen pelvis with IV contrast obtained which revealed a mild pancreatitis involving pancreatic head and body with adjacent edema.  Aorta was visualized and grossly normal.  No gallbladder abnormalities.  Initial Ranson's Criteria of 1.    Patient is tolerating food and liquid by mouth. Her pain symptoms have improved after IVF and IV morphine. She is adamant that she does not want to be admitted to the hospital for her uncomplicated pancreatitis. Patient does not drink significant alcohol and states that she has  not had anything to drink since her diagnosis of peptic ulcer disease. There are no gallstones noted in imaging and the cause of her pancreatitis is unclear.  I informed patient that she would need to have  close follow-up evaluation with Dr. Collene Mares, gastroenterology. Not also like for her to notify her primary care provider of today's encounter. Recommending small meals.  She is in no acute distress and feels prepared for discharge. I discussed very strict return precautions with patient.  Discussed with Dr. Johnney Killian who agrees with assessment and plan.  All of the evaluation and work-up results were discussed with the patient and any family at bedside.  Patient and/or family were informed that while patient is appropriate for discharge at this time, some medical emergencies may only develop or become detectable after a period of time.  I specifically instructed patient and/or family to return to return to the ED or seek immediate medical attention for any new or worsening symptoms.  They were provided opportunity to ask any additional questions and have none at this time.  Prior to discharge patient is feeling well, agreeable with plan for discharge home.  They have expressed understanding of verbal discharge instructions as well as return precautions and are agreeable to the plan.   Patient counseled to never drive or operate heavy machinery while taking narcotic or other sedating medication.   Final Clinical Impression(s) / ED Diagnoses Final diagnoses:  Acute pancreatitis without infection or necrosis, unspecified pancreatitis type    Rx / DC Orders ED Discharge Orders         Ordered    HYDROcodone-acetaminophen (NORCO/VICODIN) 5-325 MG tablet  Every 6 hours PRN     Discontinue  Reprint     05/08/20 1856    ondansetron (ZOFRAN ODT) 4 MG disintegrating tablet  Every 8 hours PRN     Discontinue  Reprint     05/08/20 1856           Corena Herter, PA-C 05/08/20 1857    Corena Herter, PA-C 05/08/20 Darci Needle, MD 05/09/20 [phone]

## 2020-05-08 NOTE — ED Notes (Signed)
Patient transported to CT 

## 2020-07-22 ENCOUNTER — Emergency Department (HOSPITAL_BASED_OUTPATIENT_CLINIC_OR_DEPARTMENT_OTHER)
Admission: EM | Admit: 2020-07-22 | Discharge: 2020-07-22 | Disposition: A | Discharge status: Home / Self Care | Payer: BC Managed Care – PPO | Attending: Emergency Medicine | Admitting: Emergency Medicine

## 2020-07-22 ENCOUNTER — Emergency Department (HOSPITAL_BASED_OUTPATIENT_CLINIC_OR_DEPARTMENT_OTHER): Payer: BC Managed Care – PPO

## 2020-07-22 ENCOUNTER — Encounter (HOSPITAL_BASED_OUTPATIENT_CLINIC_OR_DEPARTMENT_OTHER): Payer: Self-pay | Admitting: Emergency Medicine

## 2020-07-22 ENCOUNTER — Other Ambulatory Visit: Payer: Self-pay

## 2020-07-22 DIAGNOSIS — J45909 Unspecified asthma, uncomplicated: Secondary | ICD-10-CM | POA: Diagnosis not present

## 2020-07-22 DIAGNOSIS — Z79899 Other long term (current) drug therapy: Secondary | ICD-10-CM | POA: Insufficient documentation

## 2020-07-22 DIAGNOSIS — I10 Essential (primary) hypertension: Secondary | ICD-10-CM | POA: Insufficient documentation

## 2020-07-22 DIAGNOSIS — K859 Acute pancreatitis without necrosis or infection, unspecified: Secondary | ICD-10-CM | POA: Insufficient documentation

## 2020-07-22 DIAGNOSIS — Z91040 Latex allergy status: Secondary | ICD-10-CM | POA: Diagnosis not present

## 2020-07-22 DIAGNOSIS — K2980 Duodenitis without bleeding: Secondary | ICD-10-CM | POA: Insufficient documentation

## 2020-07-22 DIAGNOSIS — R1084 Generalized abdominal pain: Secondary | ICD-10-CM | POA: Diagnosis present

## 2020-07-22 LAB — COMPREHENSIVE METABOLIC PANEL
ALT: 43 U/L (ref 0–44)
AST: 54 U/L — ABNORMAL HIGH (ref 15–41)
Albumin: 4.1 g/dL (ref 3.5–5.0)
Alkaline Phosphatase: 82 U/L (ref 38–126)
Anion gap: 12 (ref 5–15)
BUN: 17 mg/dL (ref 6–20)
CO2: 26 mmol/L (ref 22–32)
Calcium: 9.1 mg/dL (ref 8.9–10.3)
Chloride: 93 mmol/L — ABNORMAL LOW (ref 98–111)
Creatinine, Ser: 0.66 mg/dL (ref 0.44–1.00)
GFR, Estimated: >60 mL/min (ref >60)
Glucose, Bld: 126 mg/dL — ABNORMAL HIGH (ref 70–99)
Potassium: 4.2 mmol/L (ref 3.5–5.1)
Sodium: 131 mmol/L — ABNORMAL LOW (ref 135–145)
Total Bilirubin: 0.7 mg/dL (ref 0.3–1.2)
Total Protein: 6.9 g/dL (ref 6.5–8.1)

## 2020-07-22 LAB — CBC
HCT: 38.8 % (ref 36.0–46.0)
Hemoglobin: 13.2 g/dL (ref 12.0–15.0)
MCH: 33.2 pg (ref 26.0–34.0)
MCHC: 34 g/dL (ref 30.0–36.0)
MCV: 97.7 fL (ref 80.0–100.0)
Platelets: 323 10*3/uL (ref 150–400)
RBC: 3.97 MIL/uL (ref 3.87–5.11)
RDW: 13 % (ref 11.5–15.5)
WBC: 11.3 10*3/uL — ABNORMAL HIGH (ref 4.0–10.5)
nRBC: 0 % (ref 0.0–0.2)

## 2020-07-22 LAB — LIPASE, BLOOD: Lipase: 184 U/L — ABNORMAL HIGH (ref 11–51)

## 2020-07-22 IMAGING — CT CT ABD-PELV W/ CM
2 of 5 series · 16 of 46 positions shown, 18 images · IV contrast (Omnipaque)
Comparison: [DATE]

CLINICAL DATA: Abdominal pain.

EXAM:
CT ABDOMEN AND PELVIS WITH CONTRAST
TECHNIQUE: Multidetector CT imaging of the abdomen and pelvis was performed
using the standard protocol following bolus administration of
intravenous contrast.
CONTRAST:  100mL OMNIPAQUE IOHEXOL 300 MG/ML  SOLN

[Series 2: axial st · axial · 0.89mm/px · z∈[+581,+1001]mm · 13 of 94 slices shown, 15 images]
[im 5/94  soft-tissue]
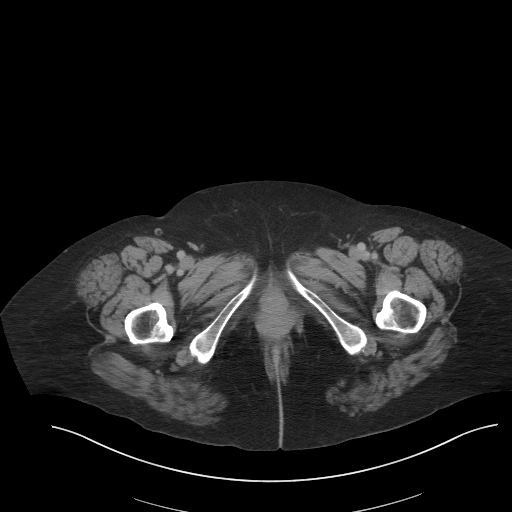
[im 5/94  bone]
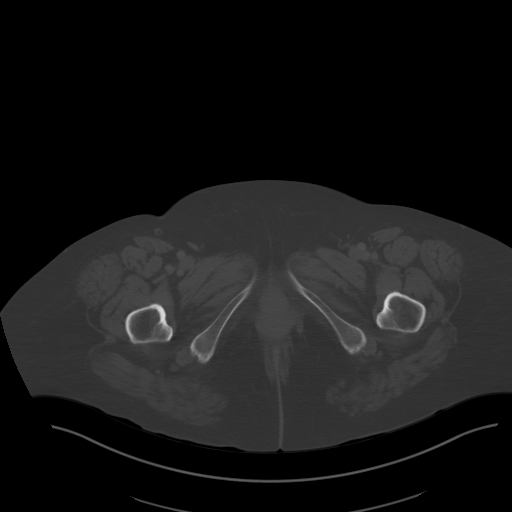
[im 14/94  soft-tissue]
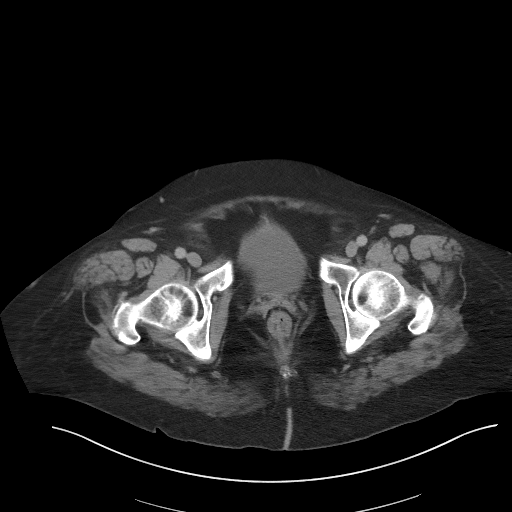
[im 19/94  soft-tissue]
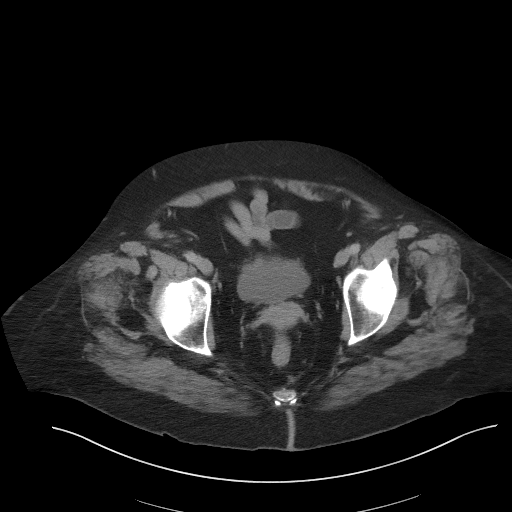
[im 28/94  soft-tissue]
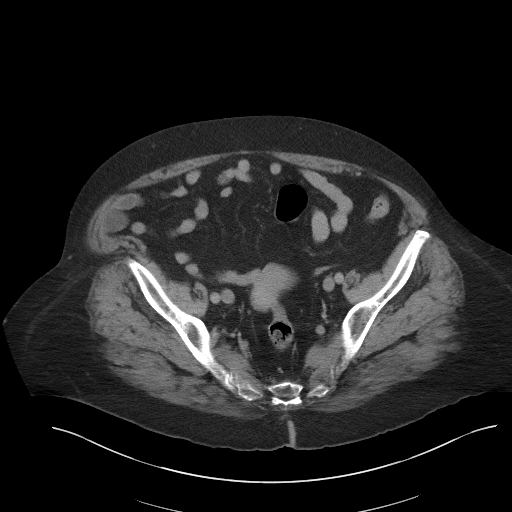
[im 33/94  soft-tissue]
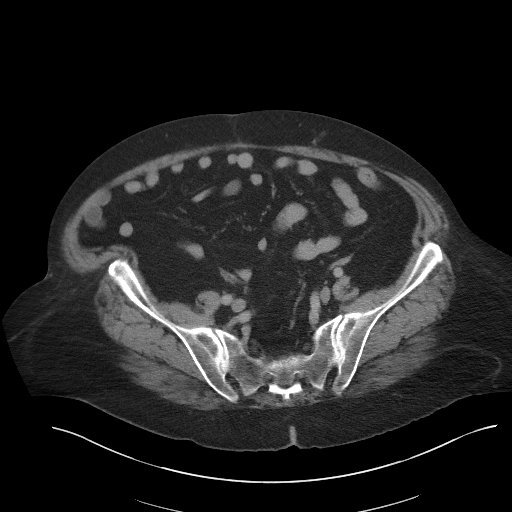
[im 42/94  soft-tissue]
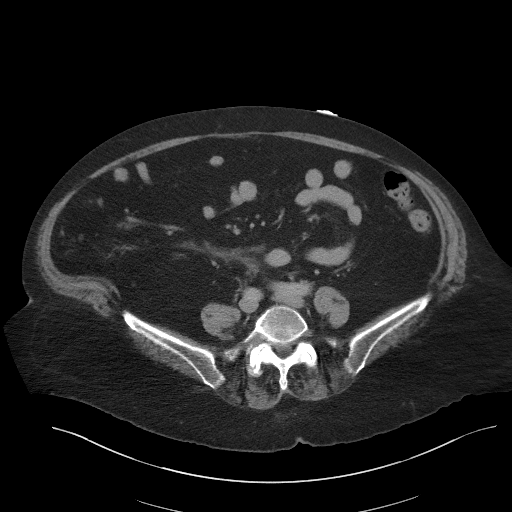
[im 47/94  soft-tissue]
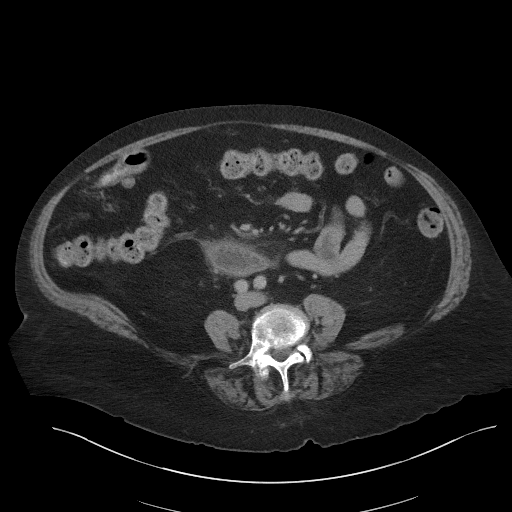
[im 52/94  soft-tissue]
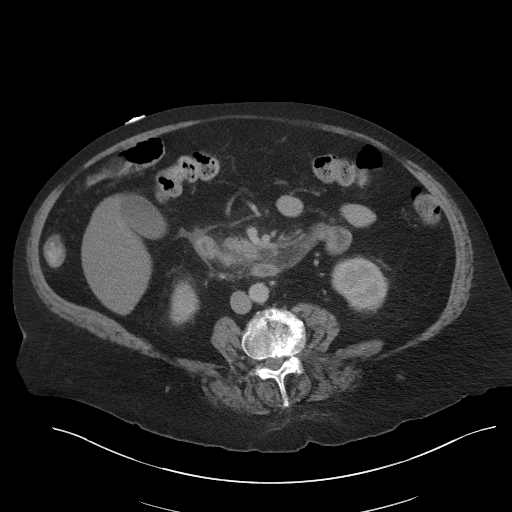
[im 61/94  soft-tissue]
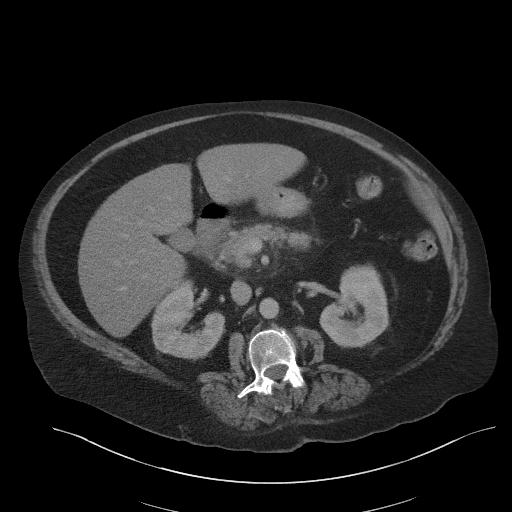
[im 61/94  bone]
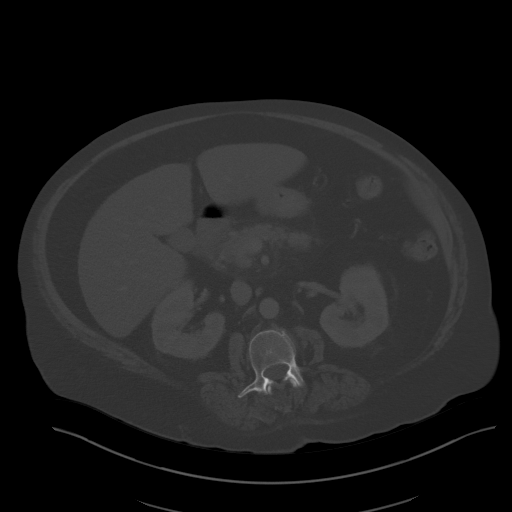
[im 66/94  soft-tissue]
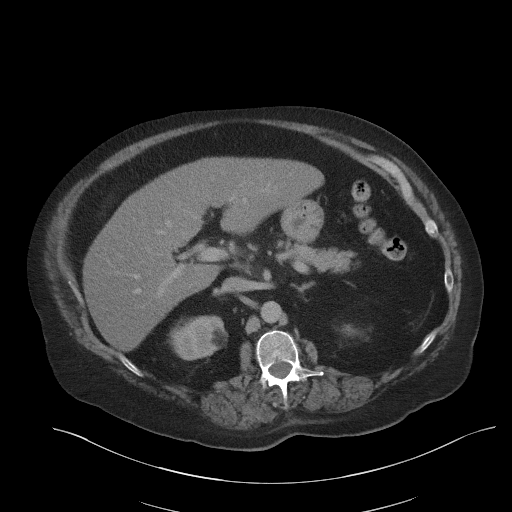
[im 75/94  soft-tissue]
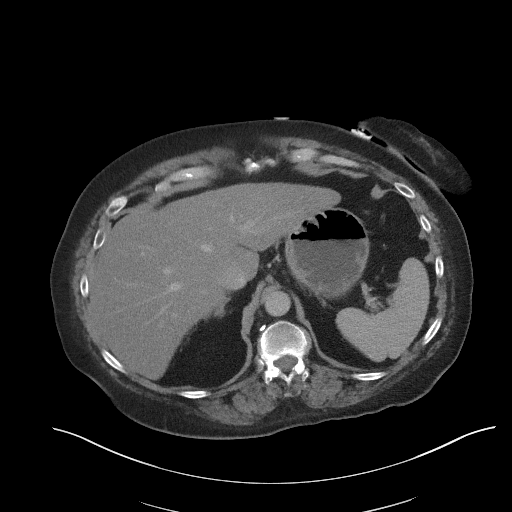
[im 80/94  soft-tissue]
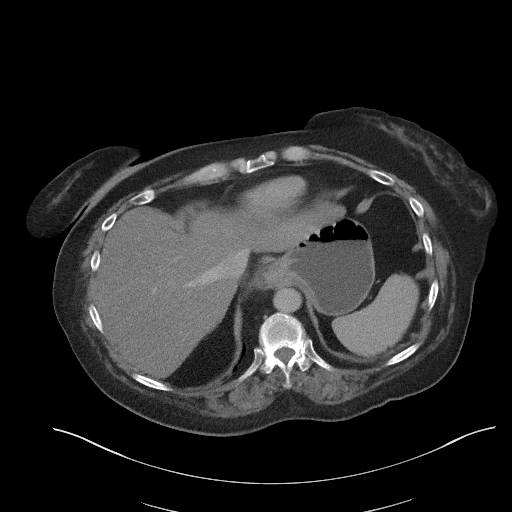
[im 89/94  soft-tissue]
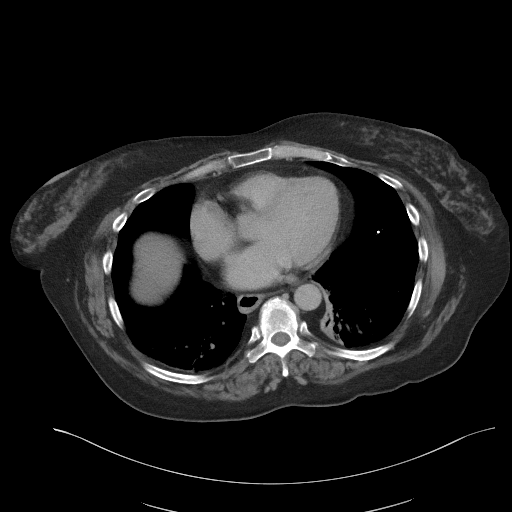

[Series 5: coronal st · coronal · 0.90mm/px · 3 of 101 slices shown]
[im 34/101  soft-tissue]
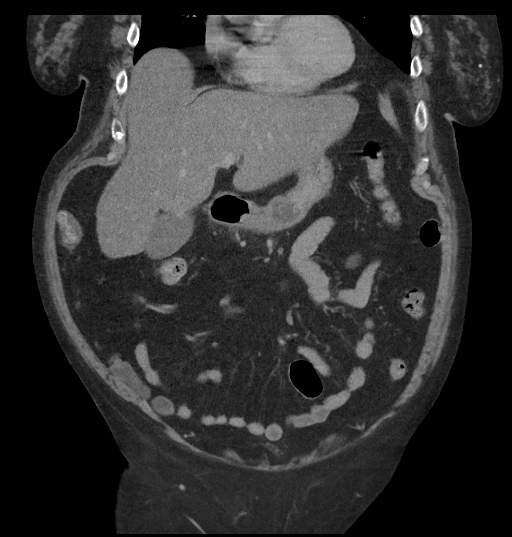
[im 45/101  soft-tissue]
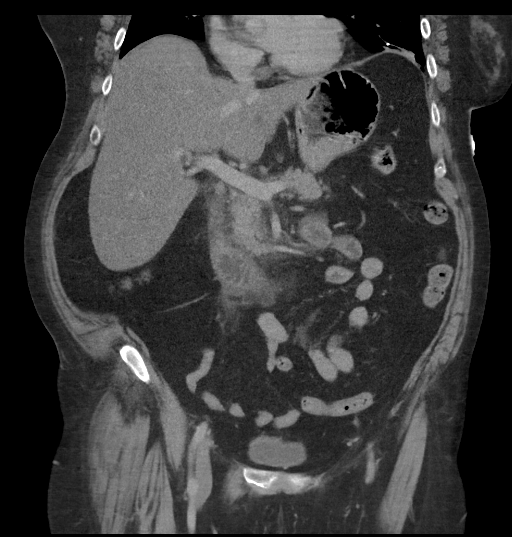
[im 56/101  soft-tissue]
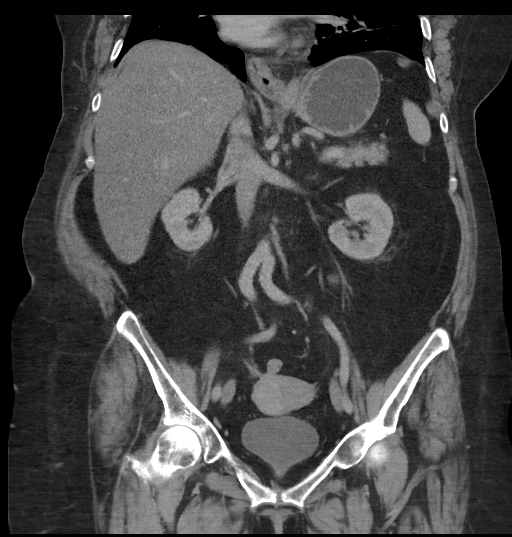

[16 of 46 positions shown; findings below may reference images not displayed]

FINDINGS: Lower chest: Moderate severity linear atelectasis is seen within the
bilateral lung bases.

Hepatobiliary: There is mild diffuse fatty infiltration of the liver
parenchyma. No focal liver abnormality is seen. No gallstones,
gallbladder wall thickening, or biliary dilatation.

Pancreas: Mild peripancreatic inflammatory fat stranding is seen
within the region of the head of the pancreas.

Spleen: Normal in size without focal abnormality.

Adrenals/Urinary Tract: Adrenal glands are unremarkable. Kidneys are
normal in size, without renal calculi or hydronephrosis. A 1.9 cm
diameter cyst is seen within the medial aspect of the mid right
kidney. Bladder is unremarkable.

Stomach/Bowel: Stomach is within normal limits. Appendix appears
normal. No evidence of bowel dilatation. Mild to moderate severity
inflammatory fat stranding is seen surrounding the proximal
duodenum. This is continuous with the previously noted
peripancreatic inflammatory fat stranding.

Vascular/Lymphatic: No significant vascular findings are present. No
enlarged abdominal or pelvic lymph nodes.

Reproductive: A 4.3 cm x 3.1 cm well-defined mildly increased
attenuation soft tissue mass is seen within the uterus on the right.
The bilateral adnexa are unremarkable.

Other: No abdominal wall hernia or abnormality. No abdominopelvic
ascites.

Musculoskeletal: There is evidence of prior vertebroplasty at the
level of T12.

Moderate to marked severity multilevel degenerative changes seen
throughout the lumbar spine.
IMPRESSION: 1. Mild to moderate severity duodenitis involving the proximal
duodenum. Acute pancreatitis involving the adjacent portion of the
pancreatic head cannot be excluded. Correlation with pancreatic
enzymes is recommended.
2. Findings likely consistent with a noncalcified uterine fibroid.
Correlation with pelvic ultrasound is recommended.
3. Moderate severity linear atelectasis within the bilateral lung
bases.
4. Evidence of prior vertebroplasty at the level of T12.

## 2020-07-22 MED ORDER — IOHEXOL 300 MG/ML  SOLN
100.0000 mL | Freq: Once | INTRAMUSCULAR | Status: AC | PRN
  Administered 2020-07-22: 100 mL via INTRAVENOUS

## 2020-07-22 MED ORDER — ONDANSETRON HCL 4 MG/2ML IJ SOLN
4.0000 mg | Freq: Once | INTRAMUSCULAR | Status: AC
  Administered 2020-07-22: 4 mg via INTRAVENOUS
  Filled 2020-07-22: qty 2

## 2020-07-22 MED ORDER — SODIUM CHLORIDE 0.9 % IV BOLUS
1000.0000 mL | Freq: Once | INTRAVENOUS | Status: AC
  Administered 2020-07-22: 1000 mL via INTRAVENOUS

## 2020-07-22 MED ORDER — HYDROMORPHONE HCL 1 MG/ML IJ SOLN
1.0000 mg | Freq: Once | INTRAMUSCULAR | Status: AC
  Administered 2020-07-22: 1 mg via INTRAVENOUS
  Filled 2020-07-22: qty 1

## 2020-07-22 MED ORDER — ONDANSETRON HCL 4 MG/2ML IJ SOLN
4.0000 mg | Freq: Once | INTRAMUSCULAR | Status: AC | PRN
  Administered 2020-07-22: 4 mg via INTRAVENOUS
  Filled 2020-07-22: qty 2

## 2020-07-22 MED ORDER — ONDANSETRON 4 MG PO TBDP: 4.0000 mg | ORAL_TABLET | Freq: Three times a day (TID) | ORAL | 0 refills | Status: DC | PRN

## 2020-07-22 MED ORDER — HYDROCODONE-ACETAMINOPHEN 5-325 MG PO TABS
1.0000 | ORAL_TABLET | Freq: Four times a day (QID) | ORAL | 0 refills | Status: DC | PRN
Start: 2020-07-22 — End: 2020-09-03

## 2020-07-22 NOTE — ED Triage Notes (Signed)
Reports hx of stomach ulcer.  Had a cortisone shot recently and feels like it has bothered her stomach.  C/o mid abdominal pain described as stabbing.  Also endorses n/v.  Vomited X 1.  Reports it was clear liquid.

## 2020-07-22 NOTE — ED Provider Notes (Signed)
[facility] EMERGENCY DEPARTMENT Provider Note   CSN: [id number] Arrival date & time: 07/22/20  1933     History Chief Complaint  Patient presents with  . Abdominal Pain    Rhonda Espinoza is a 59 y.o. female.  She is complaining of generalized abdominal pain radiating through to her back severe in nature associated with nausea and vomiting.  Nonbloody.  No diarrhea or constipation no urinary symptoms.  She attributes it to getting a steroid shot done for an epidural in her neck.  She said the last time she got a steroid shot and also flared up some pancreatitis which felt a lot like this.  She also has known stomach ulcers and is on medication for that.  No fevers or chills.  She is Covid vaccinated.  The history is provided by the patient.  Abdominal Pain Pain location:  Generalized Pain quality: aching   Pain radiates to:  Back Pain severity:  Severe Onset quality:  Gradual Timing:  Constant Progression:  Unchanged Chronicity:  Recurrent Context: not alcohol use   Relieved by:  Nothing Worsened by:  Nothing Ineffective treatments:  None tried Associated symptoms: nausea and vomiting   Associated symptoms: no chest pain, no cough, no diarrhea, no dysuria, no fever, no hematemesis, no hematochezia, no hematuria, no shortness of breath and no sore throat   Risk factors: no alcohol abuse   Risk factors comment:  Steroids      Past Medical History:  Diagnosis Date  . ADD (attention deficit disorder)   . Anxiety   . Arthritis   . Asthma    allergy induced per patient  . Disc disorder   . Hyperlipidemia   . Hypertension   . Pneumonia     Patient Active Problem List   Diagnosis Date Noted  . History of T12 kyphoplasty 01/24/2019  . Hip pain, acute, right 01/16/2019  . Acute leg pain, right 01/16/2019  . Sciatica of right side without back pain 01/16/2019  . Latex precautions, history of latex allergy 11/02/2018  . Lesion of right native kidney 08/30/2018    . Abnormal MRI, lumbar spine (08/14/2018) 08/23/2018  . Lumbar lateral recess stenosis (Left: L2-3) (Bilateral: L3-4) (Right: L4-5) 08/23/2018  . Lumbar Grade 1 Retrolisthesis of L2/L3, L3/L4, & L4/L5 08/23/2018  . Grade 1 Lumbar Anterolisthesis of L5/S1 08/23/2018  . Lumbar facet hypertrophy (Multilevel) (Bilateral) 08/19/2018  . Lumbar foraminal stenosis (Left: L2-3, L3-4) (Bilateral: L4-5, L5-S1) 08/19/2018  . Lumbar central spinal stenosis 08/19/2018  . DDD (degenerative disc disease), lumbar 08/05/2018  . Traumatic compression fracture of T12 (<15%) thoracic vertebra, sequela 08/05/2018  . Abnormal x-ray of lumbar spine (08/02/2018) 08/05/2018  . Disorder of skeletal system 08/05/2018  . Pharmacologic therapy 08/05/2018  . Problems influencing health status 08/05/2018  . Chronic pain syndrome 08/05/2018  . Class 1 obesity with body mass index (BMI) of 33.0 to 33.9 in adult 05/24/2018  . Seasonal allergic rhinitis due to pollen 02/03/2017  . Chronic low back pain (Bilateral) (R>L) 08/04/2016  . Long term current use of opiate analgesic 08/04/2016  . Long term prescription opiate use 08/04/2016  . Opiate use 08/04/2016  . Spondylosis without myelopathy or radiculopathy, lumbar region 08/04/2016  . ADD (attention deficit disorder) 05/22/2016  . GAD (generalized anxiety disorder) 05/22/2016  . Hypertension, essential 05/22/2016  . Panic attacks 05/22/2016  . Essential hypertension 08/30/2015  . Back muscle spasm 01/31/2014  . Abnormal LFTs 01/31/2014  . Blood glucose elevated 01/31/2014  .  Insomnia 01/31/2014  . Lumbar facet syndrome (Bilateral) (R>L) 01/31/2014  . Elevated LFTs 01/31/2014  . Avitaminosis D 07/03/2011  . Vitamin D deficiency 07/03/2011  . HLD (hyperlipidemia) 06/23/2011  . Hypertriglyceridemia 06/23/2011    Past Surgical History:  Procedure Laterality Date  . CESAREAN SECTION    . FOOT SURGERY Left   . HERNIA REPAIR    . KNEE ARTHROSCOPY    . KYPHOPLASTY  N/A 10/13/2018   Procedure: THORACIC 12 KYPHOPLASTY;  Surgeon: Phylliss Bob, MD;  Location: [facility];  Service: Orthopedics;  Laterality: N/A;     OB History   No obstetric history on file.     No family history on file.  Social History   Tobacco Use  . Smoking status: Never Smoker  . Smokeless tobacco: Never Used  Vaping Use  . Vaping Use: Never used  Substance Use Topics  . Alcohol use: Yes    Comment: occasional  . Drug use: Never    Home Medications Prior to Admission medications   Medication Sig Start Date End Date Taking? Authorizing Provider  ALPRAZolam [name] 0.5 MG tablet Take 0.5 mg by mouth at bedtime as needed for anxiety.  07/09/16   [provider]  atorvastatin (LIPITOR) 40 MG tablet Take 40 mg by mouth daily.  06/12/16   [provider]  desvenlafaxine (PRISTIQ) 50 MG 24 hr tablet Take 50 mg by mouth daily. 05/04/20   [provider]  fenofibrate 160 MG tablet Take 160 mg by mouth daily.  06/10/16   [provider]  HYDROcodone-acetaminophen (NORCO/VICODIN) 5-325 MG tablet Take 1 tablet by mouth every 6 (six) hours as needed for severe pain. 05/08/20   Corena Herter, PA-C  omeprazole (PRILOSEC) 40 MG capsule Take 40 mg by mouth daily as needed (heartburn).     [provider]  ondansetron (ZOFRAN ODT) 4 MG disintegrating tablet Take 1 tablet (4 mg total) by mouth every 8 (eight) hours as needed for nausea or vomiting. 05/08/20   Corena Herter, PA-C  sucralfate (CARAFATE) 1 GM/10ML suspension Take 10 mLs by mouth in the morning, at noon, in the evening, and at bedtime.  04/21/20   [provider]  tiZANidine (ZANAFLEX) 4 MG tablet Take 4 mg by mouth at bedtime as needed for muscle spasms.  04/26/20   [provider]  valsartan-hydrochlorothiazide (DIOVAN-HCT) 160-25 MG tablet Take 1 tablet by mouth daily.    [provider]  Vitamin D, Ergocalciferol, (DRISDOL) 50000 units CAPS capsule Take 50,000  Units by mouth every 7 (seven) days.  03/10/16   [provider]  zolpidem (AMBIEN CR) 12.5 MG CR tablet Take 12.5 mg by mouth at bedtime as needed for sleep.  07/16/16   [provider]    Allergies    Latex  Review of Systems   Review of Systems  Constitutional: Negative for fever.  HENT: Negative for sore throat.   Eyes: Negative for visual disturbance.  Respiratory: Negative for cough and shortness of breath.   Cardiovascular: Negative for chest pain.  Gastrointestinal: Positive for abdominal pain, nausea and vomiting. Negative for diarrhea, hematemesis and hematochezia.  Genitourinary: Negative for dysuria and hematuria.  Musculoskeletal: Positive for back pain.  Skin: Negative for rash.  Neurological: Negative for headaches.    Physical Exam Updated Vital Signs BP (!) 193/105 (BP Location: Right Arm)   Pulse 90   Temp (!) 97.5 F (36.4 C) (Oral)   Resp 18   Ht 5\' 8"  (1.727 m)  Wt 93.4 kg   LMP 06/30/2011   SpO2 100%   BMI 31.32 kg/m   Physical Exam Vitals and nursing note reviewed.  Constitutional:      General: She is not in acute distress.    Appearance: Normal appearance. She is well-developed.  HENT:     Head: Normocephalic and atraumatic.  Eyes:     Conjunctiva/sclera: Conjunctivae normal.  Cardiovascular:     Rate and Rhythm: Normal rate and regular rhythm.     Heart sounds: No murmur heard.   Pulmonary:     Effort: Pulmonary effort is normal. No respiratory distress.     Breath sounds: Normal breath sounds.  Abdominal:     Palpations: Abdomen is soft.     Tenderness: There is generalized abdominal tenderness. There is no guarding or rebound.  Musculoskeletal:        General: No deformity or signs of injury. Normal range of motion.     Cervical back: Neck supple.  Skin:    General: Skin is warm and dry.     Capillary Refill: Capillary refill takes less than 2 seconds.  Neurological:     General: No focal deficit present.      Mental Status: She is alert.     ED Results / Procedures / Treatments   Labs (all labs ordered are listed, but only abnormal results are displayed) Labs Reviewed  LIPASE, BLOOD - Abnormal; Notable for the following components:      Result Value   Lipase 184 (*)    All other components within normal limits  COMPREHENSIVE METABOLIC PANEL - Abnormal; Notable for the following components:   Sodium 131 (*)    Chloride 93 (*)    Glucose, Bld 126 (*)    AST 54 (*)    All other components within normal limits  CBC - Abnormal; Notable for the following components:   WBC 11.3 (*)    All other components within normal limits    EKG EKG Interpretation  Date/Time:  Sunday July 22 2020 19:47:07 EDT Ventricular Rate:  83 PR Interval:  152 QRS Duration: 76 QT Interval:  384 QTC Calculation: 451 R Axis:   -15 Text Interpretation: Normal sinus rhythm Cannot rule out Anterior infarct , age undetermined Abnormal ECG No significant change since prior 6/20 Confirmed by Aletta Edouard [phone]) on 07/22/2020 7:56:16 PM   Radiology CT Abdomen Pelvis W Contrast  Result Date: 07/22/2020 CLINICAL DATA:  Abdominal pain. EXAM: CT ABDOMEN AND PELVIS WITH CONTRAST TECHNIQUE: Multidetector CT imaging of the abdomen and pelvis was performed using the standard protocol following bolus administration of intravenous contrast. CONTRAST:  140mL OMNIPAQUE IOHEXOL 300 MG/ML  SOLN COMPARISON:  May 08, 2020 FINDINGS: Lower chest: Moderate severity linear atelectasis is seen within the bilateral lung bases. Hepatobiliary: There is mild diffuse fatty infiltration of the liver parenchyma. No focal liver abnormality is seen. No gallstones, gallbladder wall thickening, or biliary dilatation. Pancreas: Mild peripancreatic inflammatory fat stranding is seen within the region of the head of the pancreas. Spleen: Normal in size without focal abnormality. Adrenals/Urinary Tract: Adrenal glands are unremarkable. Kidneys are  normal in size, without renal calculi or hydronephrosis. A 1.9 cm diameter cyst is seen within the medial aspect of the mid right kidney. Bladder is unremarkable. Stomach/Bowel: Stomach is within normal limits. Appendix appears normal. No evidence of bowel dilatation. Mild to moderate severity inflammatory fat stranding is seen surrounding the proximal duodenum. This is continuous with the previously noted peripancreatic inflammatory fat  stranding. Vascular/Lymphatic: No significant vascular findings are present. No enlarged abdominal or pelvic lymph nodes. Reproductive: A 4.3 cm x 3.1 cm well-defined mildly increased attenuation soft tissue mass is seen within the uterus on the right. The bilateral adnexa are unremarkable. Other: No abdominal wall hernia or abnormality. No abdominopelvic ascites. Musculoskeletal: There is evidence of prior vertebroplasty at the level of T12. Moderate to marked severity multilevel degenerative changes seen throughout the lumbar spine. IMPRESSION: 1. Mild to moderate severity duodenitis involving the proximal duodenum. Acute pancreatitis involving the adjacent portion of the pancreatic head cannot be excluded. Correlation with pancreatic enzymes is recommended. 2. Findings likely consistent with a noncalcified uterine fibroid. Correlation with pelvic ultrasound is recommended. 3. Moderate severity linear atelectasis within the bilateral lung bases. 4. Evidence of prior vertebroplasty at the level of T12. Electronically Signed   By: Virgina Norfolk M.D.   On: 07/22/2020 21:16    Procedures Procedures (including critical care time)  Medications Ordered in ED Medications  ondansetron (ZOFRAN) injection 4 mg (has no administration in time range)  HYDROmorphone (DILAUDID) injection 1 mg (has no administration in time range)  ondansetron (ZOFRAN) injection 4 mg (has no administration in time range)  sodium chloride 0.9 % bolus 1,000 mL (has no administration in time range)     ED Course  I have reviewed the triage vital signs and the nursing notes.  Pertinent labs & imaging results that were available during my care of the patient were reviewed by me and considered in my medical decision making (see chart for details).  Clinical Course as of Jul 23 1014  Nancy Fetter Jul 22, 2020  2128 Reviewed results with patient.  She said she is dealt with pancreatitis before and feels she can manage this at home.  Prescription for pain medicine and nausea medicine and clear liquids.  Avoidance of alcohol.  Not eating until her pain is resolved.  She has a GI doctor Dr. Collene Mares.  Return instructions discussed   [MB]    Clinical Course User Index [MB] Hayden Rasmussen, MD   MDM Rules/Calculators/A&P                         This patient complains of generalized abdominal pain radiating through to her back; this involves an extensive number of treatment Options and is a complaint that carries with it a high risk of complications and Morbidity. The differential includes peptic ulcer disease, pancreatitis, obstruction, cholecystitis  I ordered, reviewed and interpreted labs, which included CBC with mildly elevated white count, normal hemoglobin, chemistries with mildly low sodium and chloride consistent with some dehydration, AST mildly elevated otherwise LFTs normal, lipase elevated at 184 I ordered medication IV fluids IV pain and nausea medication with improvement in her symptoms I ordered imaging studies which included CT abdomen and pelvis and I independently    visualized and interpreted imaging which showed duodenum and pancreas inflammatory changes Additional history obtained from patient significant other Previous records obtained and reviewed in epic including ED visit for similar complaint and diagnosis of pancreatitis.  At that point she was able to be managed as outpatient.  After the interventions stated above, I reevaluated the patient and found patient's pain to be  controlled and tolerating p.o.  She said she is comfortable with treating this at home.  Has a GI doctor.  Will send her home with prescription for some pain and nausea medication.  Return instructions discussed.   Final Clinical  Impression(s) / ED Diagnoses Final diagnoses:  Acute pancreatitis, unspecified complication status, unspecified pancreatitis type  Duodenitis    Rx / DC Orders ED Discharge Orders    None       Hayden Rasmussen, MD 07/23/20 1018

## 2020-07-22 NOTE — Discharge Instructions (Signed)
You were seen in the emergency department for generalized abdominal pain nausea vomiting.  Your lab work showed that your pancreas was inflamed.  Your CAT scan also showed the pancreas and the first part of your small intestine were inflamed.  You felt you would be able to manage her symptoms at home.  We are prescribing you some pain and nausea medication.  Please drink plenty of fluids.  Hold off on eating until your pain is resolved.  Follow-up with your GI doctor.  You experience a fever or any worsening symptoms please return to the emergency department

## 2020-07-22 NOTE — ED Notes (Signed)
Patient transported to CT 

## 2020-07-25 ENCOUNTER — Ambulatory Visit: Payer: BC Managed Care – PPO | Admitting: Dermatology

## 2020-07-30 HISTORY — PX: FRACTURE SURGERY: SHX138

## 2020-08-09 ENCOUNTER — Emergency Department (HOSPITAL_COMMUNITY): Payer: BC Managed Care – PPO

## 2020-08-09 ENCOUNTER — Emergency Department (HOSPITAL_COMMUNITY)
Admission: EM | Admit: 2020-08-09 | Discharge: 2020-08-09 | Disposition: A | Discharge status: Home / Self Care | Payer: BC Managed Care – PPO | Attending: Emergency Medicine | Admitting: Emergency Medicine

## 2020-08-09 ENCOUNTER — Other Ambulatory Visit: Payer: Self-pay

## 2020-08-09 ENCOUNTER — Encounter (HOSPITAL_COMMUNITY): Payer: Self-pay | Admitting: Emergency Medicine

## 2020-08-09 DIAGNOSIS — Y9301 Activity, walking, marching and hiking: Secondary | ICD-10-CM | POA: Diagnosis not present

## 2020-08-09 DIAGNOSIS — W1830XA Fall on same level, unspecified, initial encounter: Secondary | ICD-10-CM | POA: Diagnosis not present

## 2020-08-09 DIAGNOSIS — M25532 Pain in left wrist: Secondary | ICD-10-CM | POA: Diagnosis present

## 2020-08-09 DIAGNOSIS — Y9289 Other specified places as the place of occurrence of the external cause: Secondary | ICD-10-CM | POA: Diagnosis not present

## 2020-08-09 DIAGNOSIS — J45909 Unspecified asthma, uncomplicated: Secondary | ICD-10-CM | POA: Insufficient documentation

## 2020-08-09 DIAGNOSIS — Z91040 Latex allergy status: Secondary | ICD-10-CM | POA: Diagnosis not present

## 2020-08-09 DIAGNOSIS — I10 Essential (primary) hypertension: Secondary | ICD-10-CM | POA: Insufficient documentation

## 2020-08-09 DIAGNOSIS — Z79899 Other long term (current) drug therapy: Secondary | ICD-10-CM | POA: Diagnosis not present

## 2020-08-09 DIAGNOSIS — S52502A Unspecified fracture of the lower end of left radius, initial encounter for closed fracture: Secondary | ICD-10-CM

## 2020-08-09 IMAGING — DX DG WRIST COMPLETE 3+V*L*
4 series · 4 of 4 positions shown · non-contrast
Comparison: None.

CLINICAL DATA: Fall with wrist injury

EXAM:
LEFT WRIST - COMPLETE 3+ VIEW

[wrist ap (1 of 2)]
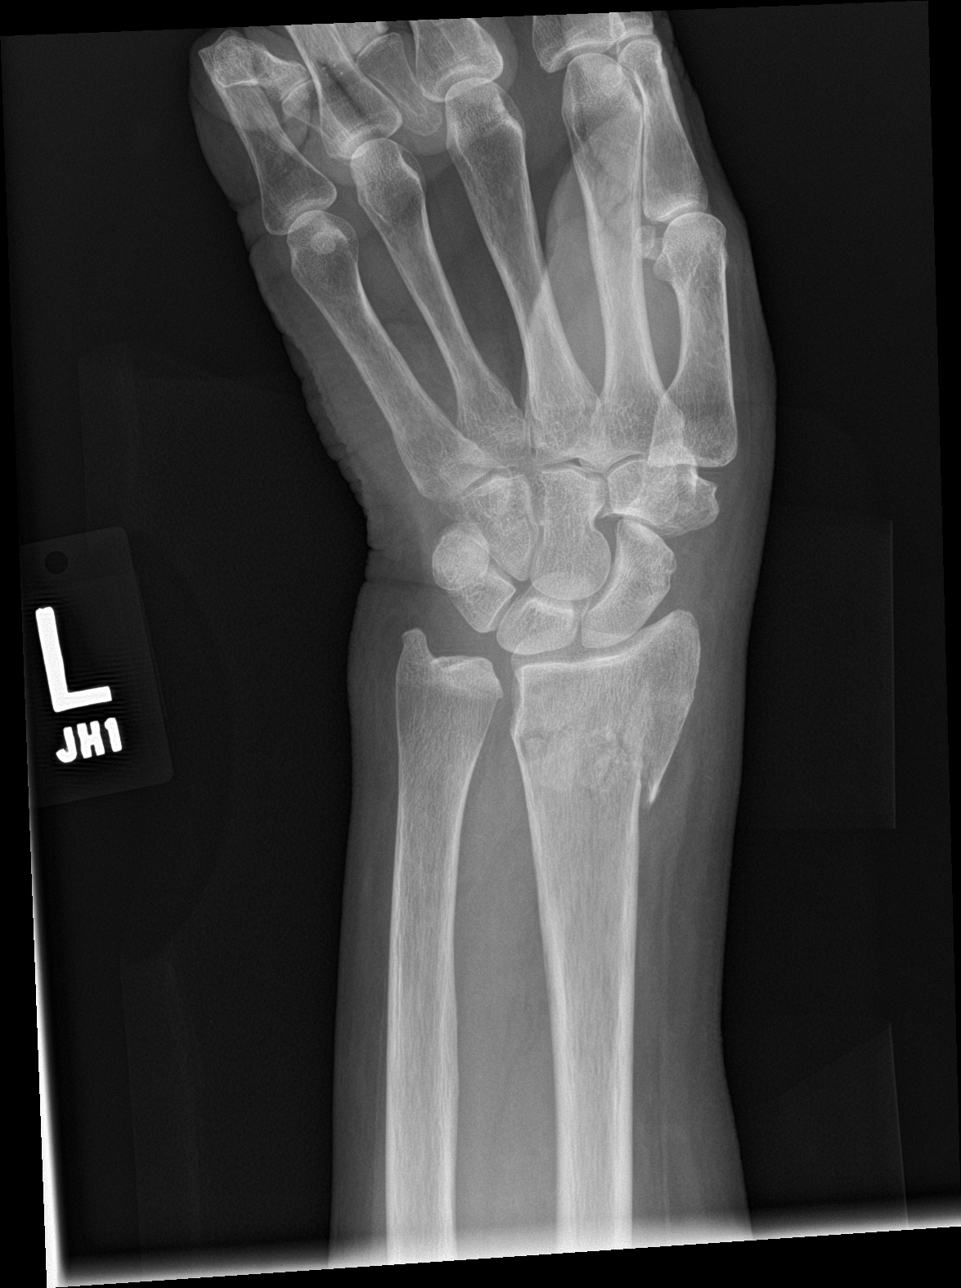

[wrist obl]
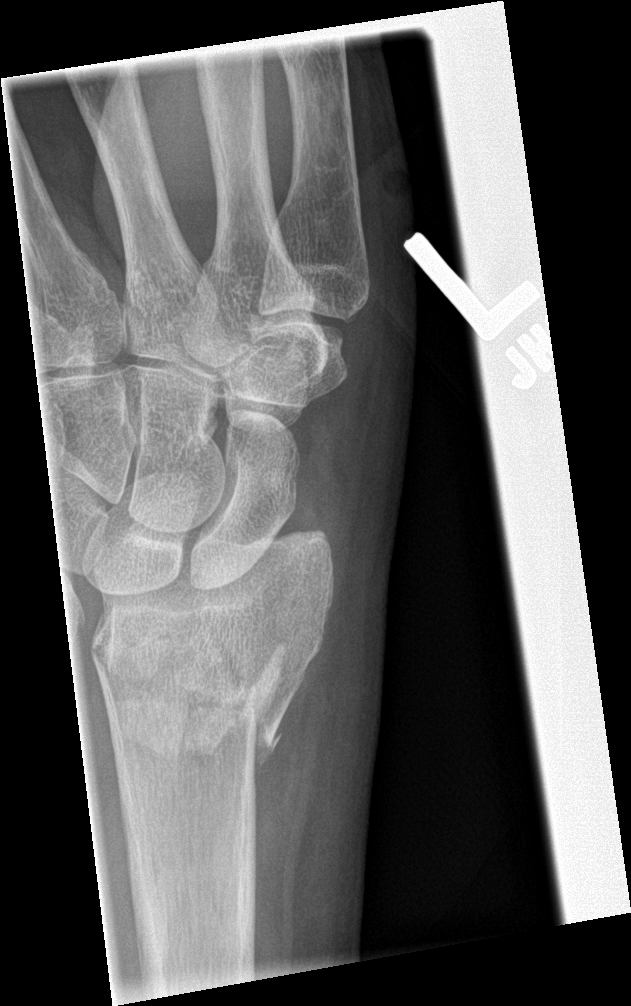

[wrist ap (2 of 2)]
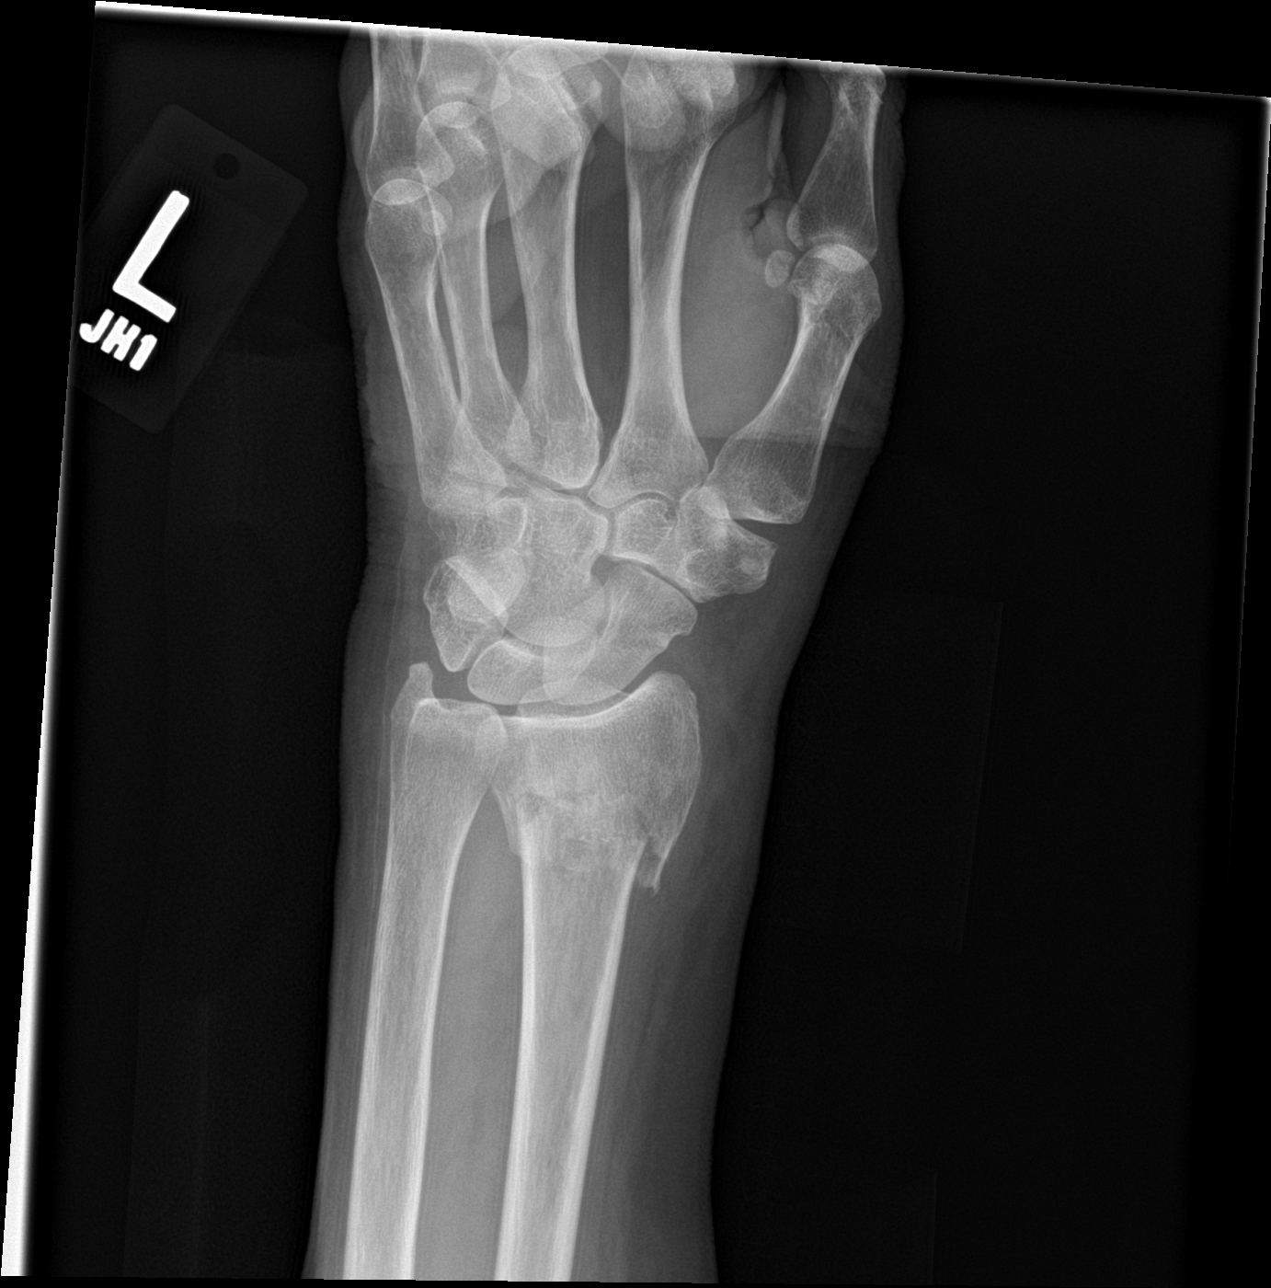

[wrist lat]
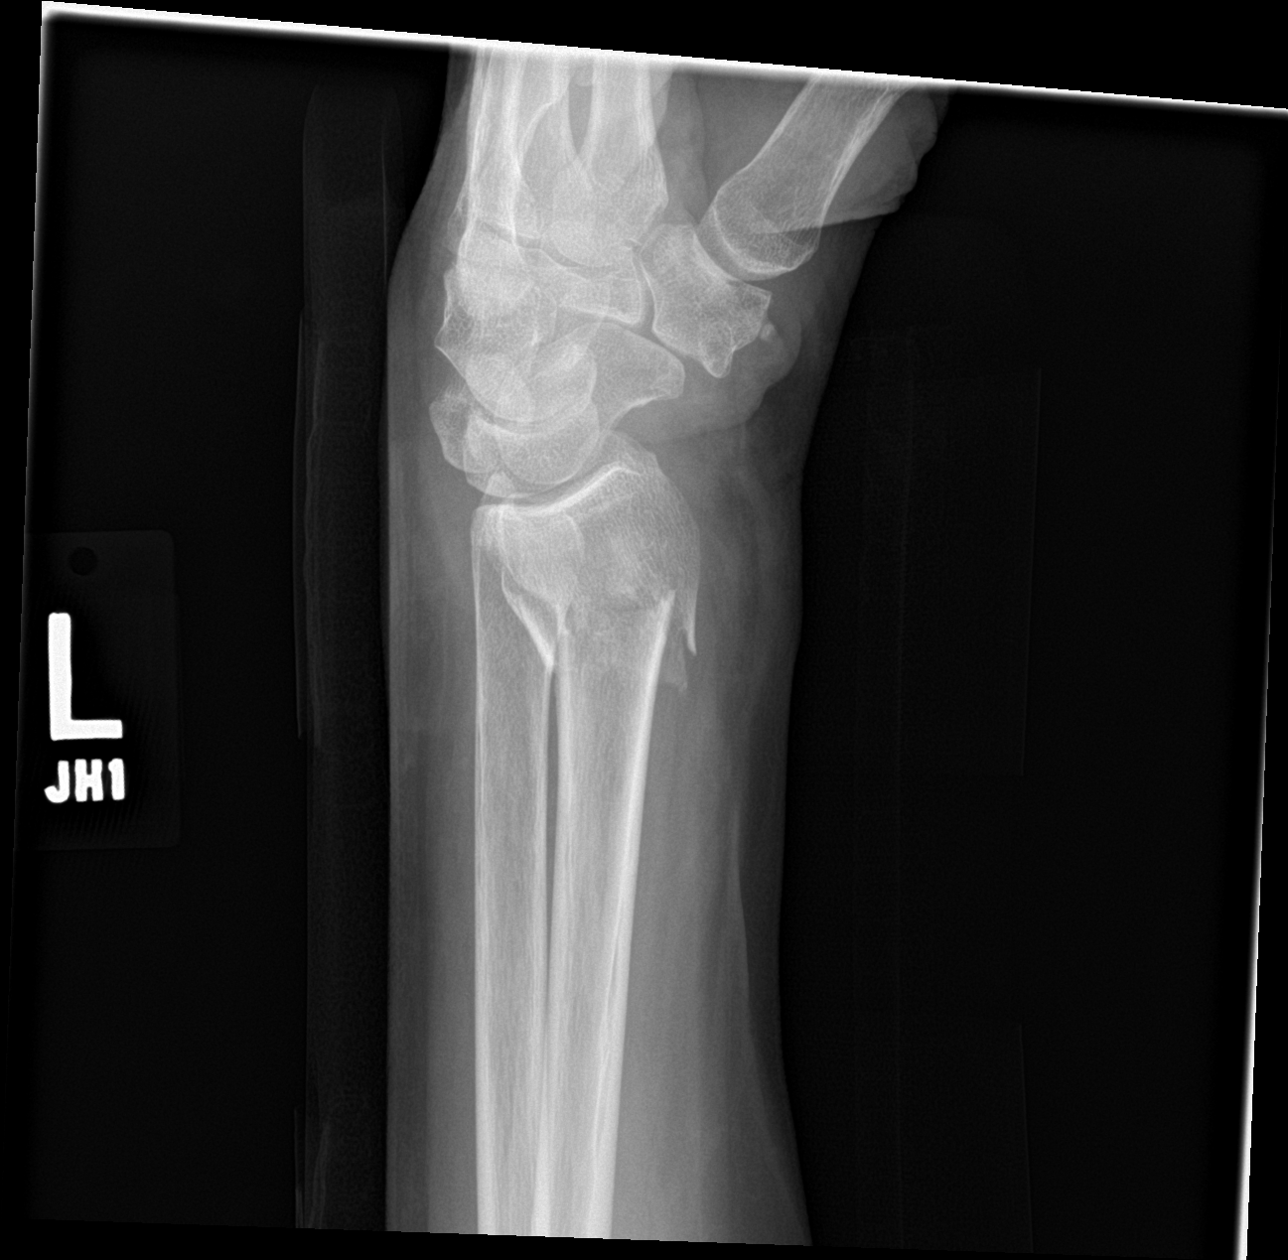

[4 of 4 positions shown; findings below may reference images not displayed]

FINDINGS: Extra-articular distal radius fracture with dorsal and lateral
impaction. The lateral view is limited by obliquity. Radiocarpal
alignment appears maintained. Negative for carpal fracture. No
definite distal ulnar fracture when allowing for spur at the distal
radioulnar joint.
IMPRESSION: Impacted distal radius fracture.

## 2020-08-09 MED ORDER — HYDROMORPHONE HCL 2 MG/ML IJ SOLN
2.0000 mg | Freq: Once | INTRAMUSCULAR | Status: AC
  Administered 2020-08-09: 2 mg via INTRAMUSCULAR
  Filled 2020-08-09: qty 1

## 2020-08-09 MED ORDER — OXYCODONE-ACETAMINOPHEN 5-325 MG PO TABS
1.0000 | ORAL_TABLET | Freq: Four times a day (QID) | ORAL | 0 refills | Status: DC | PRN
Start: 2020-08-09 — End: 2020-09-25

## 2020-08-09 MED ORDER — OXYCODONE-ACETAMINOPHEN 5-325 MG PO TABS
2.0000 | ORAL_TABLET | Freq: Once | ORAL | Status: AC
  Administered 2020-08-09: 2 via ORAL
  Filled 2020-08-09: qty 2

## 2020-08-09 NOTE — Discharge Instructions (Addendum)
Wear wrist splint as applied until followed up by orthopedics.  Ice for 20 minutes every 2 hours while awake for the next 2 days.  Keep your arm elevated above your heart as much as possible for the next several days.  You are to follow-up with hand surgery in the next 1 to 2 days.  The contact information for Dr. Caralyn Guile has been provided in this discharge summary for you to call and make these arrangements.

## 2020-08-09 NOTE — ED Provider Notes (Signed)
[facility] DEPT Provider Note   CSN: [id number] Arrival date & time: 08/09/20  0347     History Chief Complaint  Patient presents with   Wrist Pain    left    Rhonda Espinoza is a 59 y.o. female.  Patient is a 59 year old female with past medical history of degenerative disc disease, prior compression fracture, hypertension, anxiety.  She presents today for evaluation of left wrist pain.  Patient was sitting in her office chair which rolled out from under her, causing her to fall and landing on outstretched left hand.  She is complaining of pain in the left wrist along with numbness in her fingers.  She denies other injury.  The history is provided by the patient.  Wrist Pain This is a new problem. The current episode started 1 to 2 hours ago. The problem occurs constantly. The problem has not changed since onset.Exacerbated by: Movement and palpation. Nothing relieves the symptoms. She has tried nothing for the symptoms.       Past Medical History:  Diagnosis Date   ADD (attention deficit disorder)    Anxiety    Arthritis    Asthma    allergy induced per patient   Disc disorder    Hyperlipidemia    Hypertension    Pneumonia     Patient Active Problem List   Diagnosis Date Noted   History of T12 kyphoplasty 01/24/2019   Hip pain, acute, right 01/16/2019   Acute leg pain, right 01/16/2019   Sciatica of right side without back pain 01/16/2019   Latex precautions, history of latex allergy 11/02/2018   Lesion of right native kidney 08/30/2018   Abnormal MRI, lumbar spine (08/14/2018) 08/23/2018   Lumbar lateral recess stenosis (Left: L2-3) (Bilateral: L3-4) (Right: L4-5) 08/23/2018   Lumbar Grade 1 Retrolisthesis of L2/L3, L3/L4, & L4/L5 08/23/2018   Grade 1 Lumbar Anterolisthesis of L5/S1 08/23/2018   Lumbar facet hypertrophy (Multilevel) (Bilateral) 08/19/2018   Lumbar foraminal stenosis (Left: L2-3, L3-4)  (Bilateral: L4-5, L5-S1) 08/19/2018   Lumbar central spinal stenosis 08/19/2018   DDD (degenerative disc disease), lumbar 08/05/2018   Traumatic compression fracture of T12 (<15%) thoracic vertebra, sequela 08/05/2018   Abnormal x-ray of lumbar spine (08/02/2018) 08/05/2018   Disorder of skeletal system 08/05/2018   Pharmacologic therapy 08/05/2018   Problems influencing health status 08/05/2018   Chronic pain syndrome 08/05/2018   Class 1 obesity with body mass index (BMI) of 33.0 to 33.9 in adult 05/24/2018   Seasonal allergic rhinitis due to pollen 02/03/2017   Chronic low back pain (Bilateral) (R>L) 08/04/2016   Long term current use of opiate analgesic 08/04/2016   Long term prescription opiate use 08/04/2016   Opiate use 08/04/2016   Spondylosis without myelopathy or radiculopathy, lumbar region 08/04/2016   ADD (attention deficit disorder) 05/22/2016   GAD (generalized anxiety disorder) 05/22/2016   Hypertension, essential 05/22/2016   Panic attacks 05/22/2016   Essential hypertension 08/30/2015   Back muscle spasm 01/31/2014   Abnormal LFTs 01/31/2014   Blood glucose elevated 01/31/2014   Insomnia 01/31/2014   Lumbar facet syndrome (Bilateral) (R>L) 01/31/2014   Elevated LFTs 01/31/2014   Avitaminosis D 07/03/2011   Vitamin D deficiency 07/03/2011   HLD (hyperlipidemia) 06/23/2011   Hypertriglyceridemia 06/23/2011    Past Surgical History:  Procedure Laterality Date   CESAREAN SECTION     FOOT SURGERY Left    HERNIA REPAIR     KNEE ARTHROSCOPY     KYPHOPLASTY N/A  10/13/2018   Procedure: THORACIC 12 KYPHOPLASTY;  Surgeon: Phylliss Bob, MD;  Location: [facility];  Service: Orthopedics;  Laterality: N/A;     OB History   No obstetric history on file.     History reviewed. No pertinent family history.  Social History   Tobacco Use   Smoking status: Never Smoker   Smokeless tobacco: Never Used  Vaping Use   Vaping Use: Never  used  Substance Use Topics   Alcohol use: Yes    Comment: occasional   Drug use: Never    Home Medications Prior to Admission medications   Medication Sig Start Date End Date Taking? Authorizing Provider  ALPRAZolam [name] 0.5 MG tablet Take 0.5 mg by mouth at bedtime as needed for anxiety.  07/09/16   [provider]  atorvastatin (LIPITOR) 40 MG tablet Take 40 mg by mouth daily.  06/12/16   [provider]  desvenlafaxine (PRISTIQ) 50 MG 24 hr tablet Take 50 mg by mouth daily. 05/04/20   [provider]  fenofibrate 160 MG tablet Take 160 mg by mouth daily.  06/10/16   [provider]  HYDROcodone-acetaminophen (NORCO/VICODIN) 5-325 MG tablet Take 1-2 tablets by mouth every 6 (six) hours as needed for severe pain. 07/22/20   Hayden Rasmussen, MD  omeprazole (PRILOSEC) 40 MG capsule Take 40 mg by mouth daily as needed (heartburn).     [provider]  ondansetron (ZOFRAN ODT) 4 MG disintegrating tablet Take 1 tablet (4 mg total) by mouth every 8 (eight) hours as needed for nausea or vomiting. 07/22/20   Hayden Rasmussen, MD  sucralfate (CARAFATE) 1 GM/10ML suspension Take 10 mLs by mouth in the morning, at noon, in the evening, and at bedtime.  04/21/20   [provider]  tiZANidine (ZANAFLEX) 4 MG tablet Take 4 mg by mouth at bedtime as needed for muscle spasms.  04/26/20   [provider]  valsartan-hydrochlorothiazide (DIOVAN-HCT) 160-25 MG tablet Take 1 tablet by mouth daily.    [provider]  Vitamin D, Ergocalciferol, (DRISDOL) 50000 units CAPS capsule Take 50,000 Units by mouth every 7 (seven) days.  03/10/16   [provider]  zolpidem (AMBIEN CR) 12.5 MG CR tablet Take 12.5 mg by mouth at bedtime as needed for sleep.  07/16/16   [provider]    Allergies    Latex  Review of Systems   Review of Systems  All other systems reviewed and are negative.   Physical Exam Updated Vital  Signs BP (!) 156/108 (BP Location: Right Arm)    Pulse (!) 108    Temp 97.9 F (36.6 C) (Oral)    Resp 16    Ht 5\' 8"  (1.727 m)    Wt 91.2 kg    LMP 06/30/2011    SpO2 98%    BMI 30.56 kg/m   Physical Exam Vitals and nursing note reviewed.  Constitutional:      General: She is not in acute distress.    Appearance: Normal appearance. She is not ill-appearing, toxic-appearing or diaphoretic.  HENT:     Head: Normocephalic and atraumatic.  Pulmonary:     Effort: Pulmonary effort is normal.  Musculoskeletal:        General: Normal range of motion.     Comments: There is swelling of the left wrist.  Patient able to flex and extend all fingers.  Motor and sensation intact throughout the entire hand.  Capillary refill is brisk and ulnar and radial pulses  are palpable.  Skin:    General: Skin is warm and dry.  Neurological:     Mental Status: She is alert.     ED Results / Procedures / Treatments   Labs (all labs ordered are listed, but only abnormal results are displayed) Labs Reviewed - No data to display  EKG None  Radiology No results found.  Procedures Procedures (including critical care time)  Medications Ordered in ED Medications  HYDROmorphone (DILAUDID) injection 2 mg (has no administration in time range)    ED Course  I have reviewed the triage vital signs and the nursing notes.  Pertinent labs & imaging results that were available during my care of the patient were reviewed by me and considered in my medical decision making (see chart for details).    MDM Rules/Calculators/A&P  X-rays show an impacted distal radius fracture.  Patient will be placed in a splint and follow-up with hand surgery.  Final Clinical Impression(s) / ED Diagnoses Final diagnoses:  None    Rx / DC Orders ED Discharge Orders    None       Veryl Speak, MD 08/09/20 [phone]

## 2020-08-09 NOTE — ED Triage Notes (Signed)
Pt arriving with left wrist injury from a fall earlier tonight. Recently hurt her foot and tonight she was holding on to a rolling chair for balance while walking and fell over, catching herself with her hands

## 2020-08-09 NOTE — ED Notes (Signed)
Ortho tech at bedside to splint patient's arm

## 2020-08-09 NOTE — ED Notes (Signed)
Secretary called ortho tech for splint

## 2020-08-09 NOTE — Progress Notes (Signed)
Orthopedic Tech Progress Note Patient Details:  [name] 1960-10-16 [id number]  Ortho Devices Type of Ortho Device: Arm sling, Sugartong splint Ortho Device/Splint Location: LUE Ortho Device/Splint Interventions: Application, Adjustment   Post Interventions Patient Tolerated: Well Instructions Provided: Adjustment of device, Care of device   Mallory Schaad E Ritchard Paragas 08/09/2020, 7:00 AM

## 2020-09-03 ENCOUNTER — Encounter (HOSPITAL_BASED_OUTPATIENT_CLINIC_OR_DEPARTMENT_OTHER): Payer: Self-pay

## 2020-09-03 ENCOUNTER — Other Ambulatory Visit: Payer: Self-pay

## 2020-09-03 ENCOUNTER — Emergency Department (HOSPITAL_BASED_OUTPATIENT_CLINIC_OR_DEPARTMENT_OTHER)
Admission: EM | Admit: 2020-09-03 | Discharge: 2020-09-03 | Disposition: A | Discharge status: Home / Self Care | Payer: BC Managed Care – PPO | Attending: Emergency Medicine | Admitting: Emergency Medicine

## 2020-09-03 DIAGNOSIS — R112 Nausea with vomiting, unspecified: Secondary | ICD-10-CM | POA: Diagnosis not present

## 2020-09-03 DIAGNOSIS — J45909 Unspecified asthma, uncomplicated: Secondary | ICD-10-CM | POA: Insufficient documentation

## 2020-09-03 DIAGNOSIS — Z79899 Other long term (current) drug therapy: Secondary | ICD-10-CM | POA: Diagnosis not present

## 2020-09-03 DIAGNOSIS — F419 Anxiety disorder, unspecified: Secondary | ICD-10-CM | POA: Insufficient documentation

## 2020-09-03 DIAGNOSIS — Z9889 Other specified postprocedural states: Secondary | ICD-10-CM | POA: Insufficient documentation

## 2020-09-03 DIAGNOSIS — R748 Abnormal levels of other serum enzymes: Secondary | ICD-10-CM | POA: Diagnosis not present

## 2020-09-03 DIAGNOSIS — K859 Acute pancreatitis without necrosis or infection, unspecified: Secondary | ICD-10-CM

## 2020-09-03 DIAGNOSIS — Z91040 Latex allergy status: Secondary | ICD-10-CM | POA: Diagnosis not present

## 2020-09-03 DIAGNOSIS — R1013 Epigastric pain: Secondary | ICD-10-CM | POA: Insufficient documentation

## 2020-09-03 DIAGNOSIS — I10 Essential (primary) hypertension: Secondary | ICD-10-CM | POA: Insufficient documentation

## 2020-09-03 DIAGNOSIS — Z8711 Personal history of peptic ulcer disease: Secondary | ICD-10-CM

## 2020-09-03 LAB — CBC WITH DIFFERENTIAL/PLATELET
Abs Immature Granulocytes: 0.02 10*3/uL (ref 0.00–0.07)
Basophils Absolute: 0 10*3/uL (ref 0.0–0.1)
Basophils Relative: 1 %
Eosinophils Absolute: 0.2 10*3/uL (ref 0.0–0.5)
Eosinophils Relative: 3 %
HCT: 39.6 % (ref 36.0–46.0)
Hemoglobin: 13.2 g/dL (ref 12.0–15.0)
Immature Granulocytes: 0 %
Lymphocytes Relative: 28 %
Lymphs Abs: 1.7 10*3/uL (ref 0.7–4.0)
MCH: 32.8 pg (ref 26.0–34.0)
MCHC: 33.3 g/dL (ref 30.0–36.0)
MCV: 98.3 fL (ref 80.0–100.0)
Monocytes Absolute: 0.5 10*3/uL (ref 0.1–1.0)
Monocytes Relative: 9 %
Neutro Abs: 3.7 10*3/uL (ref 1.7–7.7)
Neutrophils Relative %: 59 %
Platelets: 315 10*3/uL (ref 150–400)
RBC: 4.03 MIL/uL (ref 3.87–5.11)
RDW: 12.7 % (ref 11.5–15.5)
WBC: 6.1 10*3/uL (ref 4.0–10.5)
nRBC: 0 % (ref 0.0–0.2)

## 2020-09-03 LAB — COMPREHENSIVE METABOLIC PANEL
ALT: 24 U/L (ref 0–44)
AST: 34 U/L (ref 15–41)
Albumin: 3.8 g/dL (ref 3.5–5.0)
Alkaline Phosphatase: 86 U/L (ref 38–126)
Anion gap: 12 (ref 5–15)
BUN: 8 mg/dL (ref 6–20)
CO2: 22 mmol/L (ref 22–32)
Calcium: 9.3 mg/dL (ref 8.9–10.3)
Chloride: 105 mmol/L (ref 98–111)
Creatinine, Ser: 0.54 mg/dL (ref 0.44–1.00)
GFR, Estimated: >60 mL/min (ref >60)
Glucose, Bld: 139 mg/dL — ABNORMAL HIGH (ref 70–99)
Potassium: 3.7 mmol/L (ref 3.5–5.1)
Sodium: 139 mmol/L (ref 135–145)
Total Bilirubin: 0.6 mg/dL (ref 0.3–1.2)
Total Protein: 6.8 g/dL (ref 6.5–8.1)

## 2020-09-03 LAB — LIPASE, BLOOD: Lipase: 58 U/L — ABNORMAL HIGH (ref 11–51)

## 2020-09-03 MED ORDER — PANTOPRAZOLE SODIUM 20 MG PO TBEC: 20.0000 mg | DELAYED_RELEASE_TABLET | Freq: Two times a day (BID) | ORAL | 0 refills | Status: DC

## 2020-09-03 MED ORDER — MORPHINE SULFATE (PF) 4 MG/ML IV SOLN
4.0000 mg | Freq: Once | INTRAVENOUS | Status: AC
  Administered 2020-09-03: 4 mg via INTRAVENOUS
  Filled 2020-09-03: qty 1

## 2020-09-03 MED ORDER — HYDROCODONE-ACETAMINOPHEN 5-325 MG PO TABS
1.0000 | ORAL_TABLET | Freq: Four times a day (QID) | ORAL | 0 refills | Status: DC | PRN
Start: 2020-09-03 — End: 2020-09-25

## 2020-09-03 MED ORDER — SODIUM CHLORIDE 0.9 % IV BOLUS
1000.0000 mL | Freq: Once | INTRAVENOUS | Status: AC
  Administered 2020-09-03: 1000 mL via INTRAVENOUS

## 2020-09-03 MED ORDER — SUCRALFATE 1 G PO TABS: 1.0000 g | ORAL_TABLET | Freq: Three times a day (TID) | ORAL | 0 refills | Status: DC

## 2020-09-03 MED ORDER — ONDANSETRON HCL 4 MG/2ML IJ SOLN
4.0000 mg | Freq: Once | INTRAMUSCULAR | Status: AC
  Administered 2020-09-03: 4 mg via INTRAVENOUS
  Filled 2020-09-03: qty 2

## 2020-09-03 NOTE — ED Provider Notes (Signed)
[facility] EMERGENCY DEPARTMENT Provider Note   CSN: [id number] Arrival date & time: 09/03/20  0732     History Chief Complaint  Patient presents with  . Abdominal Pain    Rhonda Espinoza is a 59 y.o. female.  Patient is a 59 year old female with history of hypertension, hyperlipidemia, asthma, and duodenal ulcer.  She presents today for evaluation of abdominal pain.  She started yesterday with epigastric pain with nausea and vomiting.  She denies any diarrhea or constipation.  She denies any fevers or chills.  Patient with similar episodes in the past.  She has been seen by GI and has had an endoscopy showing ulcers.  They suspect inflammation from the ulcers has been the cause of her pancreatitis.  The history is provided by the patient.  Abdominal Pain Pain location:  Epigastric Pain quality: burning   Pain radiates to:  Does not radiate Pain severity:  Moderate Onset quality:  Gradual Duration:  2 days Timing:  Constant Progression:  Worsening Chronicity:  New Relieved by:  Nothing Worsened by:  Movement and palpation Ineffective treatments:  None tried Associated symptoms: no constipation, no diarrhea and no fever        Past Medical History:  Diagnosis Date  . ADD (attention deficit disorder)   . Anxiety   . Arthritis   . Asthma    allergy induced per patient  . Disc disorder   . Hyperlipidemia   . Hypertension   . Pneumonia     Patient Active Problem List   Diagnosis Date Noted  . History of T12 kyphoplasty 01/24/2019  . Hip pain, acute, right 01/16/2019  . Acute leg pain, right 01/16/2019  . Sciatica of right side without back pain 01/16/2019  . Latex precautions, history of latex allergy 11/02/2018  . Lesion of right native kidney 08/30/2018  . Abnormal MRI, lumbar spine (08/14/2018) 08/23/2018  . Lumbar lateral recess stenosis (Left: L2-3) (Bilateral: L3-4) (Right: L4-5) 08/23/2018  . Lumbar Grade 1 Retrolisthesis of L2/L3, L3/L4, & L4/L5  08/23/2018  . Grade 1 Lumbar Anterolisthesis of L5/S1 08/23/2018  . Lumbar facet hypertrophy (Multilevel) (Bilateral) 08/19/2018  . Lumbar foraminal stenosis (Left: L2-3, L3-4) (Bilateral: L4-5, L5-S1) 08/19/2018  . Lumbar central spinal stenosis 08/19/2018  . DDD (degenerative disc disease), lumbar 08/05/2018  . Traumatic compression fracture of T12 (<15%) thoracic vertebra, sequela 08/05/2018  . Abnormal x-ray of lumbar spine (08/02/2018) 08/05/2018  . Disorder of skeletal system 08/05/2018  . Pharmacologic therapy 08/05/2018  . Problems influencing health status 08/05/2018  . Chronic pain syndrome 08/05/2018  . Class 1 obesity with body mass index (BMI) of 33.0 to 33.9 in adult 05/24/2018  . Seasonal allergic rhinitis due to pollen 02/03/2017  . Chronic low back pain (Bilateral) (R>L) 08/04/2016  . Long term current use of opiate analgesic 08/04/2016  . Long term prescription opiate use 08/04/2016  . Opiate use 08/04/2016  . Spondylosis without myelopathy or radiculopathy, lumbar region 08/04/2016  . ADD (attention deficit disorder) 05/22/2016  . GAD (generalized anxiety disorder) 05/22/2016  . Hypertension, essential 05/22/2016  . Panic attacks 05/22/2016  . Essential hypertension 08/30/2015  . Back muscle spasm 01/31/2014  . Abnormal LFTs 01/31/2014  . Blood glucose elevated 01/31/2014  . Insomnia 01/31/2014  . Lumbar facet syndrome (Bilateral) (R>L) 01/31/2014  . Elevated LFTs 01/31/2014  . Avitaminosis D 07/03/2011  . Vitamin D deficiency 07/03/2011  . HLD (hyperlipidemia) 06/23/2011  . Hypertriglyceridemia 06/23/2011    Past Surgical History:  Procedure Laterality  Date  . CESAREAN SECTION    . FOOT SURGERY Left   . HERNIA REPAIR    . KNEE ARTHROSCOPY    . KYPHOPLASTY N/A 10/13/2018   Procedure: THORACIC 12 KYPHOPLASTY;  Surgeon: Phylliss Bob, MD;  Location: [facility];  Service: Orthopedics;  Laterality: N/A;     OB History   No obstetric history on file.     No  family history on file.  Social History   Tobacco Use  . Smoking status: Never Smoker  . Smokeless tobacco: Never Used  Vaping Use  . Vaping Use: Never used  Substance Use Topics  . Alcohol use: Not Currently  . Drug use: Never    Home Medications Prior to Admission medications   Medication Sig Start Date End Date Taking? Authorizing Provider  ALPRAZolam [name] 0.5 MG tablet Take 0.5 mg by mouth at bedtime as needed for anxiety.  07/09/16   [provider]  atorvastatin (LIPITOR) 40 MG tablet Take 40 mg by mouth daily.  06/12/16   [provider]  desvenlafaxine (PRISTIQ) 50 MG 24 hr tablet Take 50 mg by mouth daily. 05/04/20   [provider]  fenofibrate 160 MG tablet Take 160 mg by mouth daily.  06/10/16   [provider]  HYDROcodone-acetaminophen (NORCO/VICODIN) 5-325 MG tablet Take 1-2 tablets by mouth every 6 (six) hours as needed for severe pain. 07/22/20   Hayden Rasmussen, MD  omeprazole (PRILOSEC) 40 MG capsule Take 40 mg by mouth daily as needed (heartburn).     [provider]  ondansetron (ZOFRAN ODT) 4 MG disintegrating tablet Take 1 tablet (4 mg total) by mouth every 8 (eight) hours as needed for nausea or vomiting. 07/22/20   Hayden Rasmussen, MD  oxyCODONE-acetaminophen (PERCOCET) 5-325 MG tablet Take 1-2 tablets by mouth every 6 (six) hours as needed. 08/09/20   Veryl Speak, MD  sucralfate (CARAFATE) 1 GM/10ML suspension Take 10 mLs by mouth in the morning, at noon, in the evening, and at bedtime.  04/21/20   [provider]  tiZANidine (ZANAFLEX) 4 MG tablet Take 4 mg by mouth at bedtime as needed for muscle spasms.  04/26/20   [provider]  valsartan-hydrochlorothiazide (DIOVAN-HCT) 160-25 MG tablet Take 1 tablet by mouth daily.    [provider]  Vitamin D, Ergocalciferol, (DRISDOL) 50000 units CAPS capsule Take 50,000 Units by mouth every 7 (seven) days.  03/10/16   [provider]    zolpidem (AMBIEN CR) 12.5 MG CR tablet Take 12.5 mg by mouth at bedtime as needed for sleep.  07/16/16   [provider]    Allergies    Latex  Review of Systems   Review of Systems  Constitutional: Negative for fever.  Gastrointestinal: Positive for abdominal pain. Negative for constipation and diarrhea.  All other systems reviewed and are negative.   Physical Exam Updated Vital Signs BP 123/68 (BP Location: Right Arm)   Pulse 76   Temp 98.3 F (36.8 C) (Oral)   Resp 18   Ht 5\' 8"  (1.727 m)   Wt 86.6 kg   LMP 06/30/2011   SpO2 98%   BMI 29.04 kg/m   Physical Exam Vitals and nursing note reviewed.  Constitutional:      General: She is not in acute distress.    Appearance: She is well-developed. She is not diaphoretic.  HENT:     Head: Normocephalic and atraumatic.  Cardiovascular:     Rate and Rhythm: Normal rate and regular  rhythm.     Heart sounds: No murmur heard.  No friction rub. No gallop.   Pulmonary:     Effort: Pulmonary effort is normal. No respiratory distress.     Breath sounds: Normal breath sounds. No wheezing.  Abdominal:     General: Bowel sounds are normal. There is no distension.     Palpations: Abdomen is soft.     Tenderness: There is abdominal tenderness in the epigastric area. There is no right CVA tenderness, left CVA tenderness, guarding or rebound.  Musculoskeletal:        General: Normal range of motion.     Cervical back: Normal range of motion and neck supple.  Skin:    General: Skin is warm and dry.  Neurological:     Mental Status: She is alert and oriented to person, place, and time.     ED Results / Procedures / Treatments   Labs (all labs ordered are listed, but only abnormal results are displayed) Labs Reviewed - No data to display  EKG None  Radiology No results found.  Procedures Procedures (including critical care time)  Medications Ordered in ED Medications  sodium chloride 0.9 % bolus 1,000 mL  (has no administration in time range)  ondansetron (ZOFRAN) injection 4 mg (has no administration in time range)  morphine 4 MG/ML injection 4 mg (has no administration in time range)    ED Course  I have reviewed the triage vital signs and the nursing notes.  Pertinent labs & imaging results that were available during my care of the patient were reviewed by me and considered in my medical decision making (see chart for details).    MDM Rules/Calculators/A&P  Patient presenting here with complaints of epigastric discomfort as described in the HPI.  She has a history of stomach ulcers and pancreatitis related to inflammation from the ulcer.  Her symptoms today are similar to what she is experienced in the past.  Patient's vitals are stable and laboratory studies are essentially unremarkable with the exception of a mildly elevated lipase.  Patient has been given IV fluids and medications here in the ER and seems to be feeling better.  She is currently only taking omeprazole as needed.  I will recommend stopping this medication and will start Protonix.  She will also be given Carafate to take along with meals.  She has an upcoming follow-up appointment with GI/primary doctor in the very near future.  She is to return in the meantime if her symptoms worsen or change.  I do not feel as though any imaging studies today are indicated.  She underwent CT scan just over 1 month ago and do not feel as though this needs to be repeated.  Final Clinical Impression(s) / ED Diagnoses Final diagnoses:  None    Rx / DC Orders ED Discharge Orders    None       Veryl Speak, MD 09/03/20 1025

## 2020-09-03 NOTE — ED Triage Notes (Signed)
Pt reports upper abdominal since yesterday. Pt reports n/v yesterday. Pt states she has a hx of ulcers.

## 2020-09-03 NOTE — Discharge Instructions (Addendum)
Begin taking Protonix and Carafate as prescribed.  Stop taking omeprazole.  Take hydrocodone as prescribed as needed for pain.  Follow-up with your primary doctor/GI doctor in the near future, and return to the ER if you develop worsening pain, high fever, bloody stool or vomit, or other new and concerning symptoms.

## 2020-09-17 ENCOUNTER — Telehealth: Payer: Self-pay | Admitting: Occupational Therapy

## 2020-09-17 ENCOUNTER — Ambulatory Visit: Payer: BC Managed Care – PPO | Admitting: Occupational Therapy

## 2020-09-17 NOTE — Telephone Encounter (Signed)
Pt returned call from OT regarding cancelled OT eval on 09/17/20 due to pt-reported concern about post-surgical wound. OT advised pt to call referring provider to follow-up on slow healing and preferred therapy timeline.

## 2020-09-24 ENCOUNTER — Encounter (HOSPITAL_BASED_OUTPATIENT_CLINIC_OR_DEPARTMENT_OTHER): Payer: Self-pay | Admitting: *Deleted

## 2020-09-24 ENCOUNTER — Other Ambulatory Visit: Payer: Self-pay

## 2020-09-24 DIAGNOSIS — R059 Cough, unspecified: Secondary | ICD-10-CM | POA: Insufficient documentation

## 2020-09-24 DIAGNOSIS — Z20822 Contact with and (suspected) exposure to covid-19: Secondary | ICD-10-CM | POA: Diagnosis not present

## 2020-09-24 DIAGNOSIS — I10 Essential (primary) hypertension: Secondary | ICD-10-CM | POA: Diagnosis not present

## 2020-09-24 DIAGNOSIS — K859 Acute pancreatitis without necrosis or infection, unspecified: Secondary | ICD-10-CM | POA: Diagnosis not present

## 2020-09-24 DIAGNOSIS — J45909 Unspecified asthma, uncomplicated: Secondary | ICD-10-CM | POA: Diagnosis not present

## 2020-09-24 DIAGNOSIS — R519 Headache, unspecified: Secondary | ICD-10-CM | POA: Insufficient documentation

## 2020-09-24 DIAGNOSIS — Z87311 Personal history of (healed) other pathological fracture: Secondary | ICD-10-CM | POA: Insufficient documentation

## 2020-09-24 DIAGNOSIS — Z79899 Other long term (current) drug therapy: Secondary | ICD-10-CM | POA: Insufficient documentation

## 2020-09-24 DIAGNOSIS — R109 Unspecified abdominal pain: Secondary | ICD-10-CM | POA: Diagnosis present

## 2020-09-24 DIAGNOSIS — R1110 Vomiting, unspecified: Secondary | ICD-10-CM | POA: Insufficient documentation

## 2020-09-24 DIAGNOSIS — M545 Low back pain, unspecified: Secondary | ICD-10-CM | POA: Diagnosis not present

## 2020-09-24 LAB — CBC
HCT: 46 % (ref 36.0–46.0)
Hemoglobin: 15.7 g/dL — ABNORMAL HIGH (ref 12.0–15.0)
MCH: 32.8 pg (ref 26.0–34.0)
MCHC: 34.1 g/dL (ref 30.0–36.0)
MCV: 96 fL (ref 80.0–100.0)
Platelets: 330 10*3/uL (ref 150–400)
RBC: 4.79 MIL/uL (ref 3.87–5.11)
RDW: 13.2 % (ref 11.5–15.5)
WBC: 10.4 10*3/uL (ref 4.0–10.5)
nRBC: 0 % (ref 0.0–0.2)

## 2020-09-24 LAB — COMPREHENSIVE METABOLIC PANEL
ALT: 33 U/L (ref 0–44)
AST: 53 U/L — ABNORMAL HIGH (ref 15–41)
Albumin: 4.7 g/dL (ref 3.5–5.0)
Alkaline Phosphatase: 127 U/L — ABNORMAL HIGH (ref 38–126)
Anion gap: 15 (ref 5–15)
BUN: 10 mg/dL (ref 6–20)
CO2: 20 mmol/L — ABNORMAL LOW (ref 22–32)
Calcium: 9.8 mg/dL (ref 8.9–10.3)
Chloride: 99 mmol/L (ref 98–111)
Creatinine, Ser: 0.69 mg/dL (ref 0.44–1.00)
GFR, Estimated: >60 mL/min (ref >60)
Glucose, Bld: 138 mg/dL — ABNORMAL HIGH (ref 70–99)
Potassium: 3.7 mmol/L (ref 3.5–5.1)
Sodium: 134 mmol/L — ABNORMAL LOW (ref 135–145)
Total Bilirubin: 0.7 mg/dL (ref 0.3–1.2)
Total Protein: 8 g/dL (ref 6.5–8.1)

## 2020-09-24 LAB — LIPASE, BLOOD: Lipase: 372 U/L — ABNORMAL HIGH (ref 11–51)

## 2020-09-24 MED ORDER — ONDANSETRON 4 MG PO TBDP
ORAL_TABLET | ORAL | Status: AC
  Filled 2020-09-24: qty 1

## 2020-09-24 MED ORDER — ONDANSETRON 4 MG PO TBDP
4.0000 mg | ORAL_TABLET | Freq: Once | ORAL | Status: AC
  Administered 2020-09-24: 20:00:00 4 mg via ORAL

## 2020-09-24 NOTE — ED Triage Notes (Signed)
Headache, abdominal pain and vomiting today.

## 2020-09-24 NOTE — ED Notes (Signed)
Pt ambulated in lobby, requesting to be rechecked

## 2020-09-24 NOTE — ED Notes (Signed)
Pt states she feels SOB. Ambulatory to triage for recheck. Pulse ox 100%. Speaking in complete sentences. No resp distress.

## 2020-09-25 ENCOUNTER — Emergency Department (HOSPITAL_BASED_OUTPATIENT_CLINIC_OR_DEPARTMENT_OTHER): Payer: BC Managed Care – PPO

## 2020-09-25 ENCOUNTER — Emergency Department (HOSPITAL_BASED_OUTPATIENT_CLINIC_OR_DEPARTMENT_OTHER)
Admission: EM | Admit: 2020-09-25 | Discharge: 2020-09-25 | Disposition: A | Discharge status: Home / Self Care | Payer: BC Managed Care – PPO | Attending: Emergency Medicine | Admitting: Emergency Medicine

## 2020-09-25 DIAGNOSIS — K859 Acute pancreatitis without necrosis or infection, unspecified: Secondary | ICD-10-CM

## 2020-09-25 LAB — URINALYSIS, MICROSCOPIC (REFLEX)

## 2020-09-25 LAB — URINALYSIS, ROUTINE W REFLEX MICROSCOPIC
Bilirubin Urine: NEGATIVE
Glucose, UA: NEGATIVE mg/dL
Ketones, ur: 15 mg/dL — AB
Leukocytes,Ua: NEGATIVE
Nitrite: NEGATIVE
Protein, ur: 30 mg/dL — AB
Specific Gravity, Urine: 1.03 (ref 1.005–1.030)
pH: 5 (ref 5.0–8.0)

## 2020-09-25 LAB — SARS CORONAVIRUS 2 (TAT 6-24 HRS): SARS Coronavirus 2: NEGATIVE

## 2020-09-25 IMAGING — CR DG ABDOMEN ACUTE W/ 1V CHEST
4 series · 4 of 4 positions shown · non-contrast
Comparison: [DATE]

CLINICAL DATA: Abdominal pain and vomiting

EXAM:
DG ABDOMEN ACUTE WITH 1 VIEW CHEST

[w chest pa]
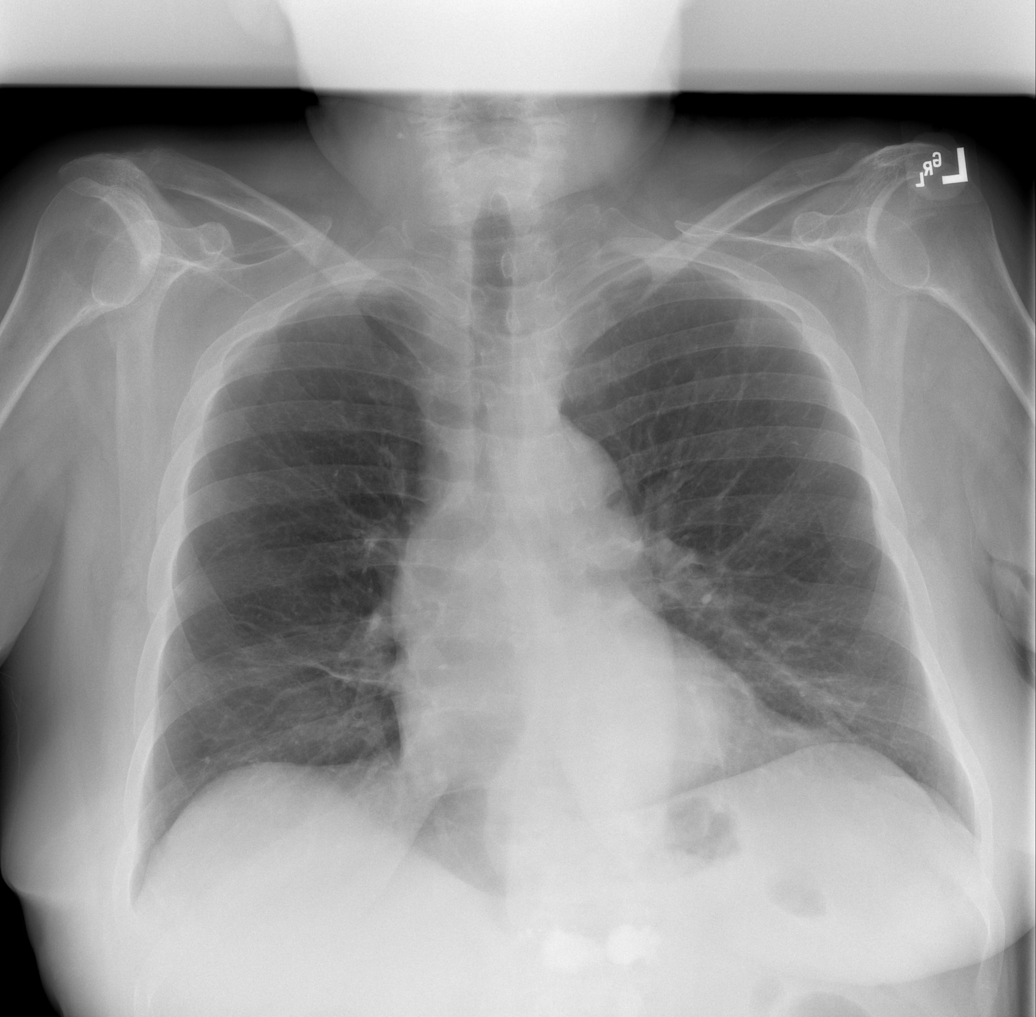

[w abdomen upright]
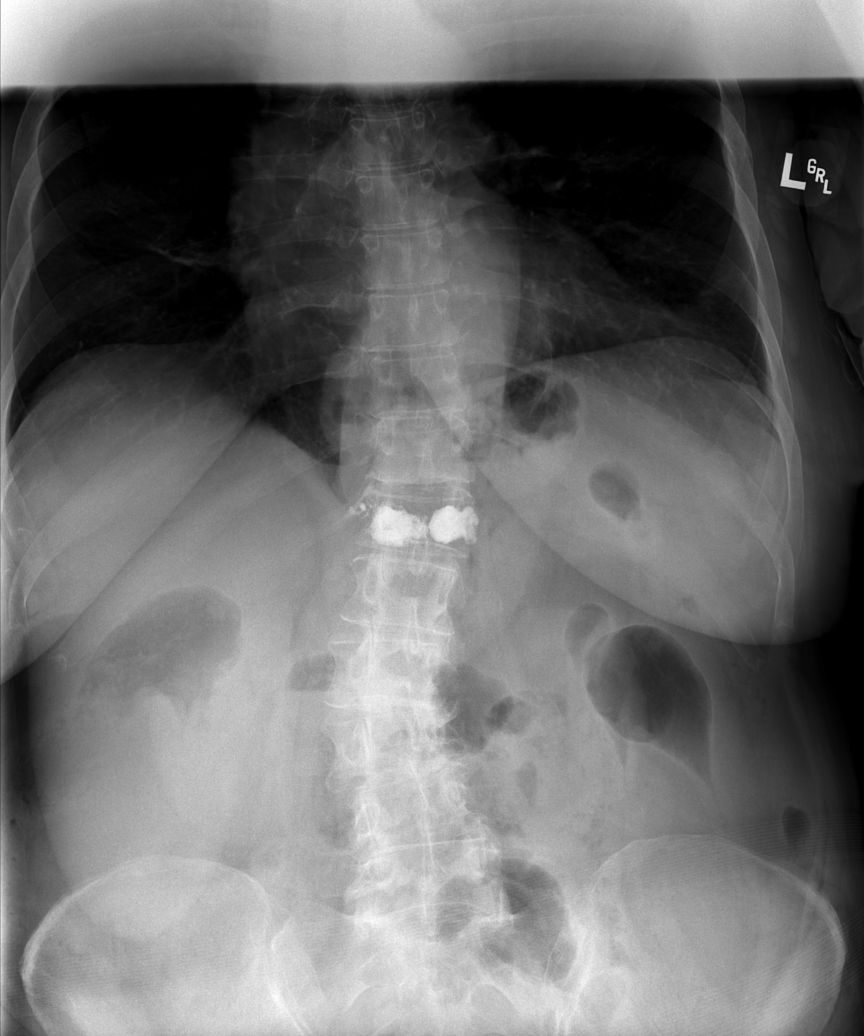

[t abdomen supine (1 of 2)]
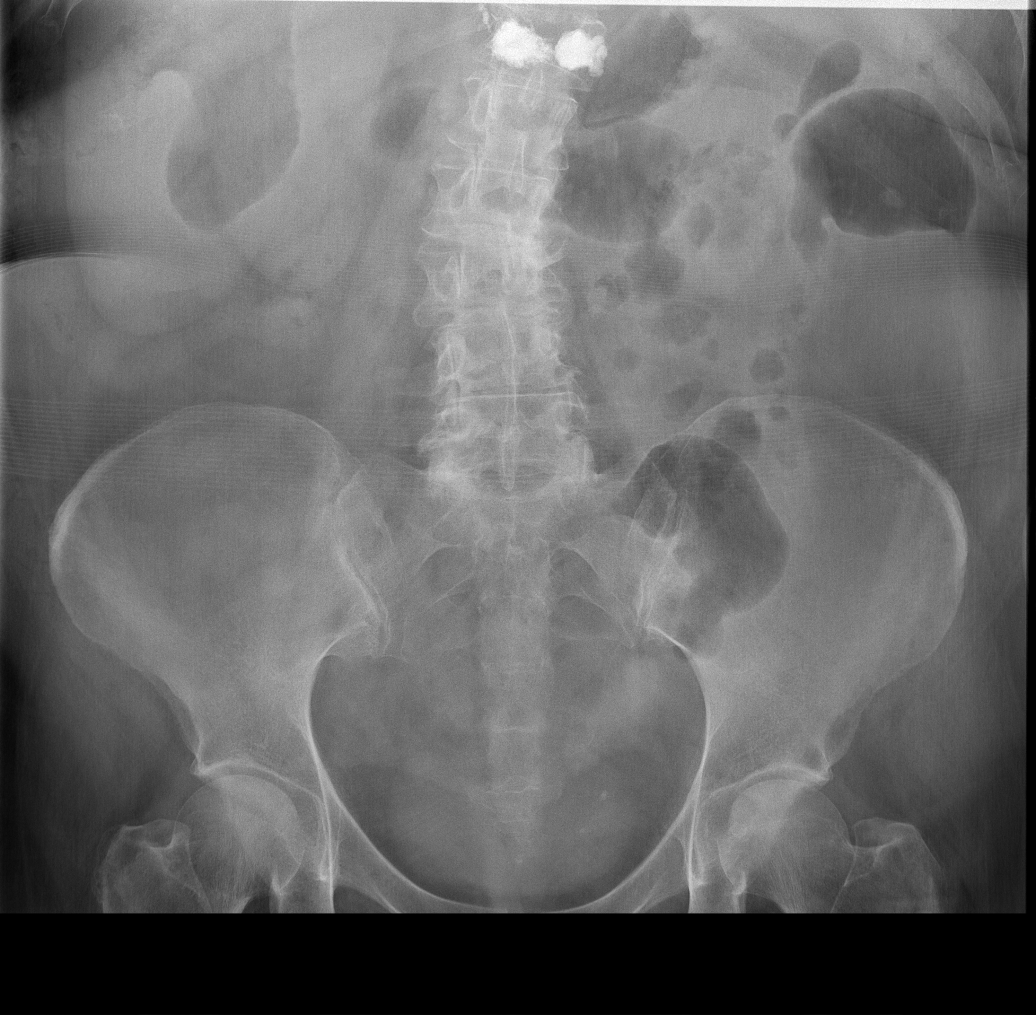

[t abdomen supine (2 of 2)]
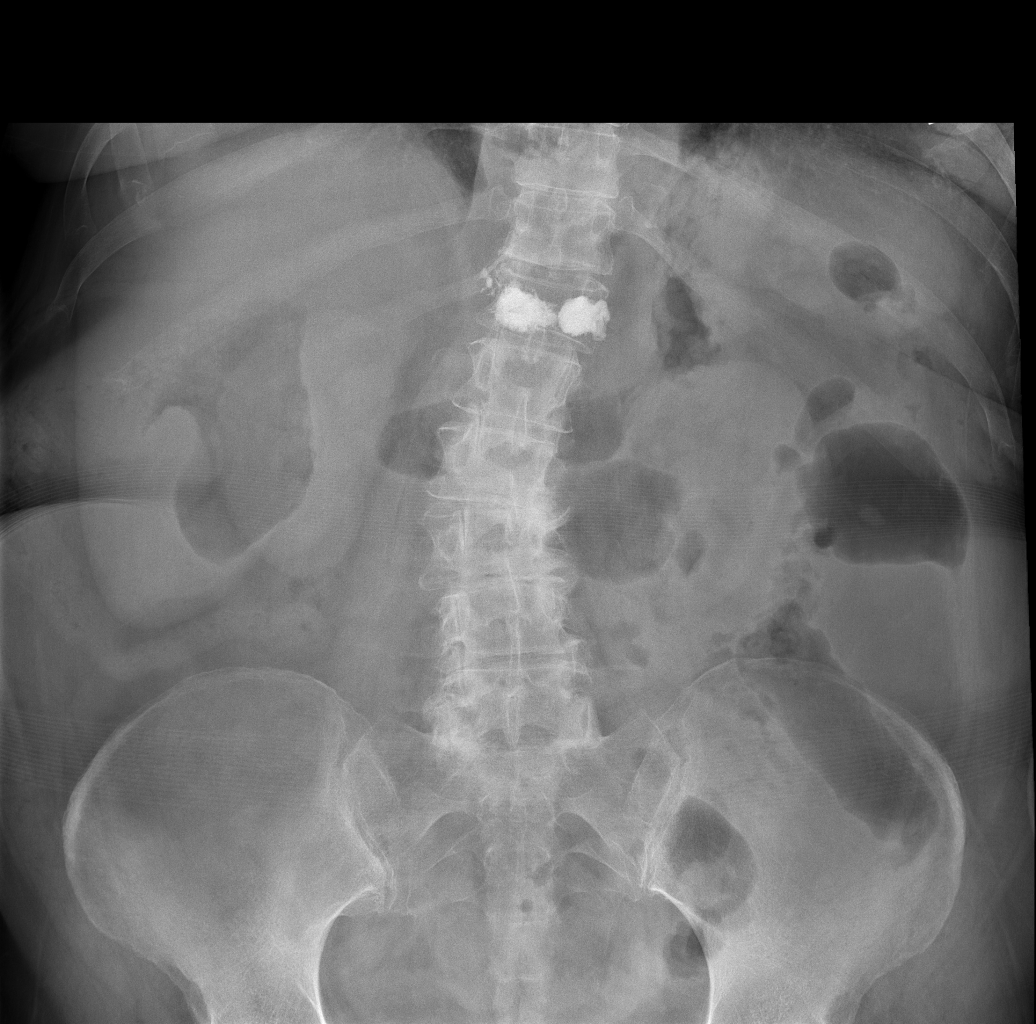

[4 of 4 positions shown; findings below may reference images not displayed]

FINDINGS: There is no evidence of dilated bowel loops or free intraperitoneal
air. No radiopaque calculi or other significant radiographic
abnormality is seen. Heart size and mediastinal contours are within
normal limits. Both lungs are clear.
IMPRESSION: Negative abdominal radiographs.  No acute cardiopulmonary disease.

## 2020-09-25 MED ORDER — PROMETHAZINE HCL 25 MG PO TABS
12.5000 mg | ORAL_TABLET | Freq: Three times a day (TID) | ORAL | 0 refills | Status: DC | PRN
Start: 2020-09-25 — End: 2022-08-07

## 2020-09-25 MED ORDER — OXYCODONE-ACETAMINOPHEN 5-325 MG PO TABS
1.0000 | ORAL_TABLET | Freq: Three times a day (TID) | ORAL | 0 refills | Status: DC | PRN
Start: 2020-09-25 — End: 2021-02-05

## 2020-09-25 MED ORDER — FENTANYL CITRATE (PF) 100 MCG/2ML IJ SOLN
50.0000 ug | Freq: Once | INTRAMUSCULAR | Status: AC
  Administered 2020-09-25: 02:00:00 50 ug via INTRAVENOUS
  Filled 2020-09-25: qty 2

## 2020-09-25 MED ORDER — SODIUM CHLORIDE 0.9 % IV BOLUS
1000.0000 mL | Freq: Once | INTRAVENOUS | Status: AC
  Administered 2020-09-25: 02:00:00 1000 mL via INTRAVENOUS

## 2020-09-25 MED ORDER — SODIUM CHLORIDE 0.9 % IV BOLUS
1000.0000 mL | Freq: Once | INTRAVENOUS | Status: AC
  Administered 2020-09-25: 04:00:00 1000 mL via INTRAVENOUS

## 2020-09-25 MED ORDER — PROMETHAZINE HCL 25 MG/ML IJ SOLN
12.5000 mg | Freq: Once | INTRAMUSCULAR | Status: AC
  Administered 2020-09-25: 04:00:00 12.5 mg via INTRAVENOUS
  Filled 2020-09-25: qty 1

## 2020-09-25 MED ORDER — HYDROMORPHONE HCL 1 MG/ML IJ SOLN
1.0000 mg | Freq: Once | INTRAMUSCULAR | Status: AC
  Administered 2020-09-25: 04:00:00 1 mg via INTRAVENOUS
  Filled 2020-09-25: qty 1

## 2020-09-25 NOTE — Discharge Instructions (Signed)
Follow-up with Dr. Cyndia Bent and her gastroenterologist.  Return for worsening symptoms or inability to manage the pain.  Return for fevers.

## 2020-09-25 NOTE — ED Notes (Signed)
Patient transported to CT 

## 2020-09-25 NOTE — ED Provider Notes (Signed)
[facility] EMERGENCY DEPARTMENT Provider Note   CSN: [id number] Arrival date & time: 09/24/20  1933     History Chief Complaint  Patient presents with  . Emesis  . Headache    Rhonda Espinoza is a 59 y.o. female.  HPI Patient presents abdominal pain headache vomiting.  Also some shortness of breath.  Cough.  Has had previous duodenal ulcers and pancreatitis.  States this feels somewhat the same.  States she has had chills and aches.  No blood in the stool.  Pain is dull.  Goes to the back.    Past Medical History:  Diagnosis Date  . ADD (attention deficit disorder)   . Anxiety   . Arthritis   . Asthma    allergy induced per patient  . Disc disorder   . Hyperlipidemia   . Hypertension   . Pneumonia     Patient Active Problem List   Diagnosis Date Noted  . History of T12 kyphoplasty 01/24/2019  . Hip pain, acute, right 01/16/2019  . Acute leg pain, right 01/16/2019  . Sciatica of right side without back pain 01/16/2019  . Latex precautions, history of latex allergy 11/02/2018  . Lesion of right native kidney 08/30/2018  . Abnormal MRI, lumbar spine (08/14/2018) 08/23/2018  . Lumbar lateral recess stenosis (Left: L2-3) (Bilateral: L3-4) (Right: L4-5) 08/23/2018  . Lumbar Grade 1 Retrolisthesis of L2/L3, L3/L4, & L4/L5 08/23/2018  . Grade 1 Lumbar Anterolisthesis of L5/S1 08/23/2018  . Lumbar facet hypertrophy (Multilevel) (Bilateral) 08/19/2018  . Lumbar foraminal stenosis (Left: L2-3, L3-4) (Bilateral: L4-5, L5-S1) 08/19/2018  . Lumbar central spinal stenosis 08/19/2018  . DDD (degenerative disc disease), lumbar 08/05/2018  . Traumatic compression fracture of T12 (<15%) thoracic vertebra, sequela 08/05/2018  . Abnormal x-ray of lumbar spine (08/02/2018) 08/05/2018  . Disorder of skeletal system 08/05/2018  . Pharmacologic therapy 08/05/2018  . Problems influencing health status 08/05/2018  . Chronic pain syndrome 08/05/2018  . Class 1 obesity with body  mass index (BMI) of 33.0 to 33.9 in adult 05/24/2018  . Seasonal allergic rhinitis due to pollen 02/03/2017  . Chronic low back pain (Bilateral) (R>L) 08/04/2016  . Long term current use of opiate analgesic 08/04/2016  . Long term prescription opiate use 08/04/2016  . Opiate use 08/04/2016  . Spondylosis without myelopathy or radiculopathy, lumbar region 08/04/2016  . ADD (attention deficit disorder) 05/22/2016  . GAD (generalized anxiety disorder) 05/22/2016  . Hypertension, essential 05/22/2016  . Panic attacks 05/22/2016  . Essential hypertension 08/30/2015  . Back muscle spasm 01/31/2014  . Abnormal LFTs 01/31/2014  . Blood glucose elevated 01/31/2014  . Insomnia 01/31/2014  . Lumbar facet syndrome (Bilateral) (R>L) 01/31/2014  . Elevated LFTs 01/31/2014  . Avitaminosis D 07/03/2011  . Vitamin D deficiency 07/03/2011  . HLD (hyperlipidemia) 06/23/2011  . Hypertriglyceridemia 06/23/2011    Past Surgical History:  Procedure Laterality Date  . CESAREAN SECTION    . FOOT SURGERY Left   . HERNIA REPAIR    . KNEE ARTHROSCOPY    . KYPHOPLASTY N/A 10/13/2018   Procedure: THORACIC 12 KYPHOPLASTY;  Surgeon: Phylliss Bob, MD;  Location: [place];  Service: Orthopedics;  Laterality: N/A;     OB History   No obstetric history on file.     No family history on file.  Social History   Tobacco Use  . Smoking status: Never Smoker  . Smokeless tobacco: Never Used  Vaping Use  . Vaping Use: Never used  Substance Use Topics  .  Alcohol use: Not Currently  . Drug use: Never    Home Medications Prior to Admission medications   Medication Sig Start Date End Date Taking? Authorizing Provider  promethazine (PHENERGAN) 25 MG tablet Take 0.5-1 tablets (12.5-25 mg total) by mouth every 8 (eight) hours as needed for nausea. 09/25/20  Yes Davonna Belling, MD  ALPRAZolam [name] 0.5 MG tablet Take 0.5 mg by mouth at bedtime as needed for anxiety.  07/09/16   [provider]   atorvastatin (LIPITOR) 40 MG tablet Take 40 mg by mouth daily.  06/12/16   [provider]  desvenlafaxine (PRISTIQ) 50 MG 24 hr tablet Take 50 mg by mouth daily. 05/04/20   [provider]  fenofibrate 160 MG tablet Take 160 mg by mouth daily.  06/10/16   [provider]  omeprazole (PRILOSEC) 40 MG capsule Take 40 mg by mouth daily as needed (heartburn).     [provider]  ondansetron (ZOFRAN ODT) 4 MG disintegrating tablet Take 1 tablet (4 mg total) by mouth every 8 (eight) hours as needed for nausea or vomiting. 07/22/20   Hayden Rasmussen, MD  oxyCODONE-acetaminophen (PERCOCET) 5-325 MG tablet Take 1-2 tablets by mouth every 8 (eight) hours as needed. 09/25/20   Davonna Belling, MD  pantoprazole (PROTONIX) 20 MG tablet Take 1 tablet (20 mg total) by mouth 2 (two) times daily. 09/03/20   Veryl Speak, MD  sucralfate (CARAFATE) 1 g tablet Take 1 tablet (1 g total) by mouth 3 (three) times daily with meals. 09/03/20   Veryl Speak, MD  tiZANidine (ZANAFLEX) 4 MG tablet Take 4 mg by mouth at bedtime as needed for muscle spasms.  04/26/20   [provider]  valsartan-hydrochlorothiazide (DIOVAN-HCT) 160-25 MG tablet Take 1 tablet by mouth daily.    [provider]  Vitamin D, Ergocalciferol, (DRISDOL) 50000 units CAPS capsule Take 50,000 Units by mouth every 7 (seven) days.  03/10/16   [provider]  zolpidem (AMBIEN CR) 12.5 MG CR tablet Take 12.5 mg by mouth at bedtime as needed for sleep.  07/16/16   [provider]    Allergies    Latex  Review of Systems   Review of Systems  Constitutional: Positive for appetite change and chills.  Respiratory: Positive for cough.   Cardiovascular: Negative for leg swelling.  Gastrointestinal: Positive for abdominal pain.  Genitourinary: Negative for dysuria.  Musculoskeletal: Positive for back pain.  Skin: Negative for rash.  Neurological: Negative for weakness.   Psychiatric/Behavioral: Negative for confusion.    Physical Exam Updated Vital Signs BP (!) 181/78   Pulse 90   Temp 98.3 F (36.8 C)   Resp 17   Ht 5\' 8"  (1.727 m)   Wt 90.7 kg   LMP 06/30/2011   SpO2 97%   BMI 30.41 kg/m   Physical Exam Vitals and nursing note reviewed.  HENT:     Head: Normocephalic.  Eyes:     General: No scleral icterus. Cardiovascular:     Rate and Rhythm: Tachycardia present.  Pulmonary:     Comments: Mildly harsh breath sounds. Abdominal:     Comments: Upper abdominal tenderness without rebound or guarding.  No hernias palpated.  Musculoskeletal:     Cervical back: Neck supple.  Skin:    General: Skin is warm.  Neurological:     Mental Status: She is alert and oriented to person, place, and time. Mental status is at baseline.  Psychiatric:        Mood and  Affect: Mood normal.     ED Results / Procedures / Treatments   Labs (all labs ordered are listed, but only abnormal results are displayed) Labs Reviewed  LIPASE, BLOOD - Abnormal; Notable for the following components:      Result Value   Lipase 372 (*)    All other components within normal limits  COMPREHENSIVE METABOLIC PANEL - Abnormal; Notable for the following components:   Sodium 134 (*)    CO2 20 (*)    Glucose, Bld 138 (*)    AST 53 (*)    Alkaline Phosphatase 127 (*)    All other components within normal limits  CBC - Abnormal; Notable for the following components:   Hemoglobin 15.7 (*)    All other components within normal limits  URINALYSIS, ROUTINE W REFLEX MICROSCOPIC - Abnormal; Notable for the following components:   APPearance CLOUDY (*)    Hgb urine dipstick SMALL (*)    Ketones, ur 15 (*)    Protein, ur 30 (*)    All other components within normal limits  URINALYSIS, MICROSCOPIC (REFLEX) - Abnormal; Notable for the following components:   Bacteria, UA MANY (*)    All other components within normal limits  SARS CORONAVIRUS 2 (TAT 6-24 HRS)     EKG None  Radiology DG Abdomen Acute W/Chest  Result Date: 09/25/2020 CLINICAL DATA:  Abdominal pain and vomiting EXAM: DG ABDOMEN ACUTE WITH 1 VIEW CHEST COMPARISON:  02/28/2020 FINDINGS: There is no evidence of dilated bowel loops or free intraperitoneal air. No radiopaque calculi or other significant radiographic abnormality is seen. Heart size and mediastinal contours are within normal limits. Both lungs are clear. IMPRESSION: Negative abdominal radiographs.  No acute cardiopulmonary disease. Electronically Signed   By: Ulyses Jarred M.D.   On: 09/25/2020 01:20    Procedures Procedures (including critical care time)  Medications Ordered in ED Medications  ondansetron (ZOFRAN-ODT) 4 MG disintegrating tablet (  Canceled Entry 09/25/20 0133)  ondansetron (ZOFRAN-ODT) disintegrating tablet 4 mg (4 mg Oral Given 09/24/20 1946)  sodium chloride 0.9 % bolus 1,000 mL (0 mLs Intravenous Stopped 09/25/20 0230)  fentaNYL (SUBLIMAZE) injection 50 mcg (50 mcg Intravenous Given 09/25/20 0131)  HYDROmorphone (DILAUDID) injection 1 mg (1 mg Intravenous Given 09/25/20 0333)  promethazine (PHENERGAN) injection 12.5 mg (12.5 mg Intravenous Given 09/25/20 0333)  sodium chloride 0.9 % bolus 1,000 mL (0 mLs Intravenous Stopped 09/25/20 0436)    ED Course  I have reviewed the triage vital signs and the nursing notes.  Pertinent labs & imaging results that were available during my care of the patient were reviewed by me and considered in my medical decision making (see chart for details).    MDM Rules/Calculators/A&P                         Patient presents with abdominal pain and vomiting.  History of pancreatitis and duodenitis/ulcers.  Lipase is elevated.  Hemoglobin mildly elevated consistent with some dehydration.  Patient felt better after treatment.  Tolerated orals.  Feels she can manage this at home.  Will follow with her gastroenterologist.  Not having urinary symptoms but did add a  somewhat dirty urine.  Instructed that if she develops symptoms will need further following up for that also.  Discharge home.  Likely pancreatitis plus or minus duodenitis/ulcer.  Doubt severe intra-abdominal infection at this time.  Discharge home. Final Clinical Impression(s) / ED Diagnoses Final diagnoses:  Acute pancreatitis, unspecified complication  status, unspecified pancreatitis type    Rx / DC Orders ED Discharge Orders         Ordered    oxyCODONE-acetaminophen (PERCOCET) 5-325 MG tablet  Every 8 hours PRN        09/25/20 0545    promethazine (PHENERGAN) 25 MG tablet  Every 8 hours PRN        09/25/20 0545           Benjiman Core, MD 09/25/20 [phone]

## 2020-09-29 HISTORY — PX: CHOLECYSTECTOMY: SHX55

## 2020-10-01 ENCOUNTER — Ambulatory Visit: Payer: BC Managed Care – PPO | Admitting: Occupational Therapy

## 2020-10-13 ENCOUNTER — Other Ambulatory Visit: Payer: Self-pay | Admitting: Gastroenterology

## 2020-10-13 DIAGNOSIS — R109 Unspecified abdominal pain: Secondary | ICD-10-CM

## 2020-10-13 DIAGNOSIS — R634 Abnormal weight loss: Secondary | ICD-10-CM

## 2020-10-13 DIAGNOSIS — R9389 Abnormal findings on diagnostic imaging of other specified body structures: Secondary | ICD-10-CM

## 2020-10-22 ENCOUNTER — Other Ambulatory Visit: Payer: Self-pay

## 2020-10-22 ENCOUNTER — Encounter (HOSPITAL_BASED_OUTPATIENT_CLINIC_OR_DEPARTMENT_OTHER): Payer: Self-pay | Admitting: Emergency Medicine

## 2020-10-22 DIAGNOSIS — I10 Essential (primary) hypertension: Secondary | ICD-10-CM | POA: Insufficient documentation

## 2020-10-22 DIAGNOSIS — J45909 Unspecified asthma, uncomplicated: Secondary | ICD-10-CM | POA: Insufficient documentation

## 2020-10-22 DIAGNOSIS — Z79899 Other long term (current) drug therapy: Secondary | ICD-10-CM | POA: Insufficient documentation

## 2020-10-22 DIAGNOSIS — R112 Nausea with vomiting, unspecified: Secondary | ICD-10-CM | POA: Diagnosis present

## 2020-10-22 DIAGNOSIS — R1013 Epigastric pain: Secondary | ICD-10-CM | POA: Diagnosis not present

## 2020-10-22 DIAGNOSIS — Z91040 Latex allergy status: Secondary | ICD-10-CM | POA: Diagnosis not present

## 2020-10-22 MED ORDER — ONDANSETRON 4 MG PO TBDP
8.0000 mg | ORAL_TABLET | Freq: Once | ORAL | Status: AC
  Administered 2020-10-22: 8 mg via ORAL
  Filled 2020-10-22: qty 2

## 2020-10-22 NOTE — ED Triage Notes (Signed)
Pt with vomiitng today. Denies diarrhea.

## 2020-10-23 ENCOUNTER — Other Ambulatory Visit (HOSPITAL_BASED_OUTPATIENT_CLINIC_OR_DEPARTMENT_OTHER): Payer: Self-pay | Admitting: Emergency Medicine

## 2020-10-23 ENCOUNTER — Emergency Department (HOSPITAL_BASED_OUTPATIENT_CLINIC_OR_DEPARTMENT_OTHER)
Admission: EM | Admit: 2020-10-23 | Discharge: 2020-10-23 | Disposition: A | Discharge status: Home / Self Care | Payer: BC Managed Care – PPO | Attending: Emergency Medicine | Admitting: Emergency Medicine

## 2020-10-23 DIAGNOSIS — R112 Nausea with vomiting, unspecified: Secondary | ICD-10-CM | POA: Diagnosis not present

## 2020-10-23 DIAGNOSIS — R1013 Epigastric pain: Secondary | ICD-10-CM

## 2020-10-23 DIAGNOSIS — Z8719 Personal history of other diseases of the digestive system: Secondary | ICD-10-CM

## 2020-10-23 HISTORY — DX: Alcohol induced acute pancreatitis without necrosis or infection: K85.20

## 2020-10-23 HISTORY — DX: Abnormal results of liver function studies: R94.5

## 2020-10-23 LAB — CBC
HCT: 44.5 % (ref 36.0–46.0)
Hemoglobin: 15.8 g/dL — ABNORMAL HIGH (ref 12.0–15.0)
MCH: 32.2 pg (ref 26.0–34.0)
MCHC: 35.5 g/dL (ref 30.0–36.0)
MCV: 90.8 fL (ref 80.0–100.0)
Platelets: 411 10*3/uL — ABNORMAL HIGH (ref 150–400)
RBC: 4.9 MIL/uL (ref 3.87–5.11)
RDW: 12.5 % (ref 11.5–15.5)
WBC: 11.3 10*3/uL — ABNORMAL HIGH (ref 4.0–10.5)
nRBC: 0 % (ref 0.0–0.2)

## 2020-10-23 LAB — COMPREHENSIVE METABOLIC PANEL
ALT: 21 U/L (ref 0–44)
AST: 33 U/L (ref 15–41)
Albumin: 4.9 g/dL (ref 3.5–5.0)
Alkaline Phosphatase: 105 U/L (ref 38–126)
Anion gap: 20 — ABNORMAL HIGH (ref 5–15)
BUN: 12 mg/dL (ref 6–20)
CO2: 20 mmol/L — ABNORMAL LOW (ref 22–32)
Calcium: 10.3 mg/dL (ref 8.9–10.3)
Chloride: 89 mmol/L — ABNORMAL LOW (ref 98–111)
Creatinine, Ser: 1.05 mg/dL — ABNORMAL HIGH (ref 0.44–1.00)
GFR, Estimated: >60 mL/min (ref >60)
Glucose, Bld: 193 mg/dL — ABNORMAL HIGH (ref 70–99)
Potassium: 3.3 mmol/L — ABNORMAL LOW (ref 3.5–5.1)
Sodium: 129 mmol/L — ABNORMAL LOW (ref 135–145)
Total Bilirubin: 1 mg/dL (ref 0.3–1.2)
Total Protein: 8.4 g/dL — ABNORMAL HIGH (ref 6.5–8.1)

## 2020-10-23 LAB — LIPASE, BLOOD: Lipase: 28 U/L (ref 11–51)

## 2020-10-23 LAB — TROPONIN I (HIGH SENSITIVITY): Troponin I (High Sensitivity): 14 ng/L (ref <18)

## 2020-10-23 MED ORDER — SODIUM CHLORIDE 0.9 % IV BOLUS
500.0000 mL | Freq: Once | INTRAVENOUS | Status: AC
  Administered 2020-10-23: 500 mL via INTRAVENOUS

## 2020-10-23 MED ORDER — MORPHINE SULFATE (PF) 4 MG/ML IV SOLN
4.0000 mg | Freq: Once | INTRAVENOUS | Status: AC
  Administered 2020-10-23: 4 mg via INTRAVENOUS
  Filled 2020-10-23: qty 1

## 2020-10-23 MED ORDER — PROMETHAZINE HCL 25 MG/ML IJ SOLN
12.5000 mg | Freq: Once | INTRAMUSCULAR | Status: AC
  Administered 2020-10-23: 12.5 mg via INTRAVENOUS
  Filled 2020-10-23: qty 1

## 2020-10-23 MED ORDER — ONDANSETRON 8 MG PO TBDP: ORAL_TABLET | ORAL | 0 refills | Status: DC

## 2020-10-23 MED ORDER — HYDROCODONE-ACETAMINOPHEN 5-325 MG PO TABS: 1.0000 | ORAL_TABLET | Freq: Four times a day (QID) | ORAL | 0 refills | Status: DC | PRN

## 2020-10-23 MED ORDER — SODIUM CHLORIDE 0.9 % IV BOLUS
1000.0000 mL | Freq: Once | INTRAVENOUS | Status: AC
  Administered 2020-10-23: 1000 mL via INTRAVENOUS

## 2020-10-23 MED ORDER — KETOROLAC TROMETHAMINE 30 MG/ML IJ SOLN
30.0000 mg | Freq: Once | INTRAMUSCULAR | Status: AC
  Administered 2020-10-23: 30 mg via INTRAVENOUS
  Filled 2020-10-23: qty 1

## 2020-10-23 MED FILL — ONDANSETRON ODT 8 MG TABLET: 8 | 2 days supply | Qty: 10 | Fill #0

## 2020-10-23 MED FILL — HYDROCODON-APAP 5-325: 5-325 | 2 days supply | Qty: 12 | Fill #0

## 2020-10-23 NOTE — ED Provider Notes (Signed)
[facility] EMERGENCY DEPARTMENT Provider Note   CSN: [id number] Arrival date & time: 10/22/20  2302     History Chief Complaint  Patient presents with  . Vomiting    Rhonda Espinoza is a 60 y.o. female.  Patient is a 60 year old female with past medical history of asthma, alcohol induced pancreatitis, hypertension, hyperlipidemia. Patient presents today for evaluation of nausea. This began 2 days ago and has rapidly worsened. She denies diarrhea or constipation. She denies fevers or chills. She has had recurrent pancreatitis and has been in the ER relatively frequently over the past several months. There are no aggravating or alleviating factors.  The history is provided by the patient.       Past Medical History:  Diagnosis Date  . Abnormal LFTs   . ADD (attention deficit disorder)   . Alcohol-induced pancreatitis   . Anxiety   . Arthritis   . Asthma    allergy induced per patient  . Disc disorder   . Hyperlipidemia   . Hypertension   . Pneumonia     Patient Active Problem List   Diagnosis Date Noted  . History of T12 kyphoplasty 01/24/2019  . Hip pain, acute, right 01/16/2019  . Acute leg pain, right 01/16/2019  . Sciatica of right side without back pain 01/16/2019  . Latex precautions, history of latex allergy 11/02/2018  . Lesion of right native kidney 08/30/2018  . Abnormal MRI, lumbar spine (08/14/2018) 08/23/2018  . Lumbar lateral recess stenosis (Left: L2-3) (Bilateral: L3-4) (Right: L4-5) 08/23/2018  . Lumbar Grade 1 Retrolisthesis of L2/L3, L3/L4, & L4/L5 08/23/2018  . Grade 1 Lumbar Anterolisthesis of L5/S1 08/23/2018  . Lumbar facet hypertrophy (Multilevel) (Bilateral) 08/19/2018  . Lumbar foraminal stenosis (Left: L2-3, L3-4) (Bilateral: L4-5, L5-S1) 08/19/2018  . Lumbar central spinal stenosis 08/19/2018  . DDD (degenerative disc disease), lumbar 08/05/2018  . Traumatic compression fracture of T12 (<15%) thoracic vertebra, sequela  08/05/2018  . Abnormal x-ray of lumbar spine (08/02/2018) 08/05/2018  . Disorder of skeletal system 08/05/2018  . Pharmacologic therapy 08/05/2018  . Problems influencing health status 08/05/2018  . Chronic pain syndrome 08/05/2018  . Class 1 obesity with body mass index (BMI) of 33.0 to 33.9 in adult 05/24/2018  . Seasonal allergic rhinitis due to pollen 02/03/2017  . Chronic low back pain (Bilateral) (R>L) 08/04/2016  . Long term current use of opiate analgesic 08/04/2016  . Long term prescription opiate use 08/04/2016  . Opiate use 08/04/2016  . Spondylosis without myelopathy or radiculopathy, lumbar region 08/04/2016  . ADD (attention deficit disorder) 05/22/2016  . GAD (generalized anxiety disorder) 05/22/2016  . Hypertension, essential 05/22/2016  . Panic attacks 05/22/2016  . Essential hypertension 08/30/2015  . Back muscle spasm 01/31/2014  . Abnormal LFTs 01/31/2014  . Blood glucose elevated 01/31/2014  . Insomnia 01/31/2014  . Lumbar facet syndrome (Bilateral) (R>L) 01/31/2014  . Elevated LFTs 01/31/2014  . Avitaminosis D 07/03/2011  . Vitamin D deficiency 07/03/2011  . HLD (hyperlipidemia) 06/23/2011  . Hypertriglyceridemia 06/23/2011    Past Surgical History:  Procedure Laterality Date  . CESAREAN SECTION    . FOOT SURGERY Left   . HERNIA REPAIR    . KNEE ARTHROSCOPY    . KYPHOPLASTY N/A 10/13/2018   Procedure: THORACIC 12 KYPHOPLASTY;  Surgeon: Phylliss Bob, MD;  Location: [facility];  Service: Orthopedics;  Laterality: N/A;     OB History   No obstetric history on file.     No family history on file.  Social History   Tobacco Use  . Smoking status: Never Smoker  . Smokeless tobacco: Never Used  Vaping Use  . Vaping Use: Never used  Substance Use Topics  . Alcohol use: Not Currently  . Drug use: Never    Home Medications Prior to Admission medications   Medication Sig Start Date End Date Taking? Authorizing Provider  ALPRAZolam [name] 0.5 MG  tablet Take 0.5 mg by mouth at bedtime as needed for anxiety.  07/09/16   [provider]  atorvastatin (LIPITOR) 40 MG tablet Take 40 mg by mouth daily.  06/12/16   [provider]  desvenlafaxine (PRISTIQ) 50 MG 24 hr tablet Take 50 mg by mouth daily. 05/04/20   [provider]  fenofibrate 160 MG tablet Take 160 mg by mouth daily.  06/10/16   [provider]  omeprazole (PRILOSEC) 40 MG capsule Take 40 mg by mouth daily as needed (heartburn).     [provider]  ondansetron (ZOFRAN ODT) 4 MG disintegrating tablet Take 1 tablet (4 mg total) by mouth every 8 (eight) hours as needed for nausea or vomiting. 07/22/20   Hayden Rasmussen, MD  oxyCODONE-acetaminophen (PERCOCET) 5-325 MG tablet Take 1-2 tablets by mouth every 8 (eight) hours as needed. 09/25/20   Davonna Belling, MD  pantoprazole (PROTONIX) 20 MG tablet Take 1 tablet (20 mg total) by mouth 2 (two) times daily. 09/03/20   Veryl Speak, MD  promethazine (PHENERGAN) 25 MG tablet Take 0.5-1 tablets (12.5-25 mg total) by mouth every 8 (eight) hours as needed for nausea. 09/25/20   Davonna Belling, MD  sucralfate (CARAFATE) 1 g tablet Take 1 tablet (1 g total) by mouth 3 (three) times daily with meals. 09/03/20   Veryl Speak, MD  tiZANidine (ZANAFLEX) 4 MG tablet Take 4 mg by mouth at bedtime as needed for muscle spasms.  04/26/20   [provider]  valsartan-hydrochlorothiazide (DIOVAN-HCT) 160-25 MG tablet Take 1 tablet by mouth daily.    [provider]  Vitamin D, Ergocalciferol, (DRISDOL) 50000 units CAPS capsule Take 50,000 Units by mouth every 7 (seven) days.  03/10/16   [provider]  zolpidem (AMBIEN CR) 12.5 MG CR tablet Take 12.5 mg by mouth at bedtime as needed for sleep.  07/16/16   [provider]    Allergies    Latex  Review of Systems   Review of Systems  All other systems reviewed and are negative.   Physical Exam Updated Vital  Signs BP (!) 163/82   Pulse 93   Temp (!) 97.4 F (36.3 C) (Oral)   Resp (!) 9   Ht 5\' 8"  (1.727 m)   Wt 88.5 kg   LMP 06/30/2011   SpO2 99%   BMI 29.65 kg/m   Physical Exam Vitals and nursing note reviewed.  Constitutional:      General: She is not in acute distress.    Appearance: She is well-developed and well-nourished. She is not diaphoretic.  HENT:     Head: Normocephalic and atraumatic.  Cardiovascular:     Rate and Rhythm: Normal rate and regular rhythm.     Heart sounds: No murmur heard. No friction rub. No gallop.   Pulmonary:     Effort: Pulmonary effort is normal. No respiratory distress.     Breath sounds: Normal breath sounds. No wheezing.  Abdominal:     General: Bowel sounds are normal. There is no distension.     Palpations: Abdomen is soft.  Tenderness: There is no abdominal tenderness.  Musculoskeletal:        General: Normal range of motion.     Cervical back: Normal range of motion and neck supple.  Skin:    General: Skin is warm and dry.  Neurological:     Mental Status: She is alert and oriented to person, place, and time.     ED Results / Procedures / Treatments   Labs (all labs ordered are listed, but only abnormal results are displayed) Labs Reviewed  COMPREHENSIVE METABOLIC PANEL - Abnormal; Notable for the following components:      Result Value   Sodium 129 (*)    Potassium 3.3 (*)    Chloride 89 (*)    CO2 20 (*)    Glucose, Bld 193 (*)    Creatinine, Ser 1.05 (*)    Total Protein 8.4 (*)    Anion gap 20 (*)    All other components within normal limits  CBC - Abnormal; Notable for the following components:   WBC 11.3 (*)    Hemoglobin 15.8 (*)    Platelets 411 (*)    All other components within normal limits  LIPASE, BLOOD  TROPONIN I (HIGH SENSITIVITY)    EKG EKG Interpretation  Date/Time:  Tuesday October 23 2020 02:44:27 EST Ventricular Rate:  93 PR Interval:    QRS Duration: 81 QT Interval:  418 QTC  Calculation: 520 R Axis:   -4 Text Interpretation: Sinus rhythm Probable left atrial enlargement Prolonged QT interval No significant change since 07/22/2020 Confirmed by Veryl Speak [phone]) on 10/23/2020 3:29:04 AM   Radiology No results found.  Procedures Procedures   Medications Ordered in ED Medications  ketorolac (TORADOL) 30 MG/ML injection 30 mg (has no administration in time range)  promethazine (PHENERGAN) injection 12.5 mg (has no administration in time range)  morphine 4 MG/ML injection 4 mg (has no administration in time range)  sodium chloride 0.9 % bolus 500 mL (has no administration in time range)  ondansetron (ZOFRAN-ODT) disintegrating tablet 8 mg (8 mg Oral Given 10/22/20 2315)  sodium chloride 0.9 % bolus 1,000 mL (0 mLs Intravenous Stopped 10/23/20 0403)    ED Course  I have reviewed the triage vital signs and the nursing notes.  Pertinent labs & imaging results that were available during my care of the patient were reviewed by me and considered in my medical decision making (see chart for details).    MDM Rules/Calculators/A&P  Patient is a 60 year old female with history of recurrent pancreatitis presenting with epigastric pain and vomiting.  Patient's vital signs are stable and she is afebrile.  I suspect that her presentation is related to a recurrence of pancreatitis.  Her lab studies are suggestive of mild dehydration.  Patient given a intravenous fluids and medicine for pain and nausea and seems to be feeling better.  She has been imaged for this in the past and CT scan has showed pancreatitis.  At this point, I see no indication for additional imaging.  Patient feeling better and seems appropriate for discharge.  Final Clinical Impression(s) / ED Diagnoses Final diagnoses:  None    Rx / DC Orders ED Discharge Orders    None       Veryl Speak, MD 10/23/20 [phone]

## 2020-10-23 NOTE — Discharge Instructions (Signed)
Begin taking Percocet as prescribed as needed for pain.  Take Zofran as prescribed as needed for nausea.  Clear liquid diet for the next 24 hours, then slowly advance to bland diet as tolerated.  Return to the emergency department for worsening pain, high fever, bloody stools, or other new and concerning symptoms.

## 2020-11-04 ENCOUNTER — Ambulatory Visit
Admission: RE | Admit: 2020-11-04 | Discharge: 2020-11-04 | Disposition: A | Discharge status: Home / Self Care | Payer: BC Managed Care – PPO | Source: Ambulatory Visit | Attending: Gastroenterology | Admitting: Gastroenterology

## 2020-11-04 ENCOUNTER — Other Ambulatory Visit: Payer: BC Managed Care – PPO

## 2020-11-04 ENCOUNTER — Other Ambulatory Visit: Payer: Self-pay

## 2020-11-04 DIAGNOSIS — R9389 Abnormal findings on diagnostic imaging of other specified body structures: Secondary | ICD-10-CM

## 2020-11-04 DIAGNOSIS — R634 Abnormal weight loss: Secondary | ICD-10-CM

## 2020-11-04 DIAGNOSIS — R109 Unspecified abdominal pain: Secondary | ICD-10-CM

## 2020-11-04 IMAGING — MR MR ABDOMEN WO/W CM MRCP
14 of 20 series · 30 of 48 positions shown · IV contrast (18ml Multihance)
Comparison: CT abdomen pelvis [DATE].

CLINICAL DATA: Nonspecific abdominal pain with weight loss prior
abnormal CT scan.

EXAM:
MRI ABDOMEN WITHOUT AND WITH CONTRAST (INCLUDING MRCP)
TECHNIQUE: Multiplanar multisequence MR imaging of the abdomen was performed
both before and after the administration of intravenous contrast.
Heavily T2-weighted images of the biliary and pancreatic ducts were
obtained, and three-dimensional MRCP images were rendered by post
processing.
CONTRAST:  18mL MULTIHANCE GADOBENATE DIMEGLUMINE 529 MG/ML IV SOLN

[Series 3: T2 · axial · 6.0mm · 0.78mm/px · z∈[-142,+113]mm · 2 of 38 slices shown (1 of 3)]
[im 1/38]
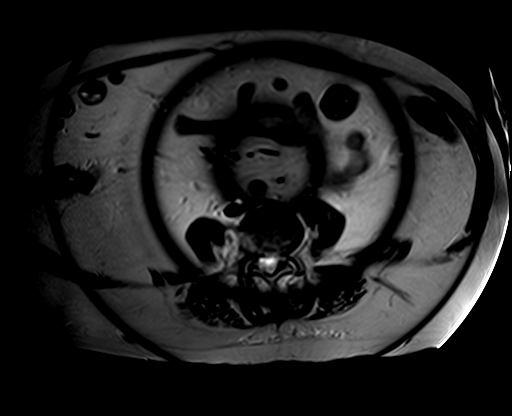
[im 38/38]
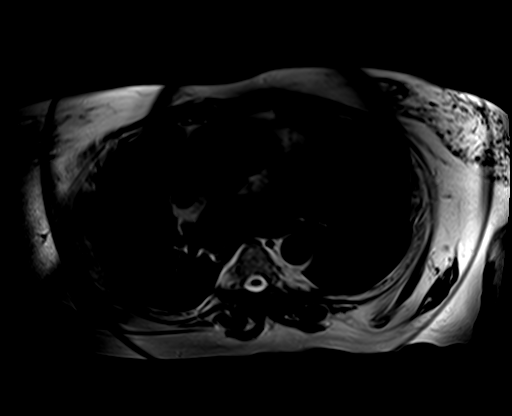

[Series 4: T2 · coronal · 5.0mm · 1.56mm/px · 2 of 40 slices shown (2 of 3)]
[im 1/40]
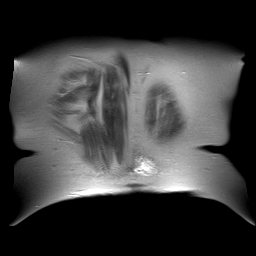
[im 40/40]
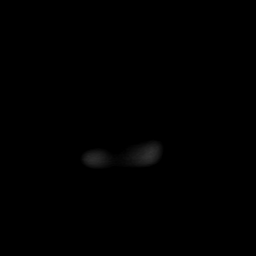

[Series 5: T2 · axial · 6.0mm · 1.56mm/px · z∈[-148,+93]mm · 2 of 36 slices shown (3 of 3)]
[im 1/36]
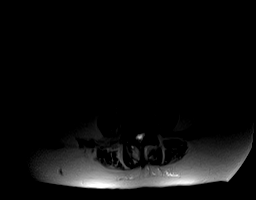
[im 36/36]
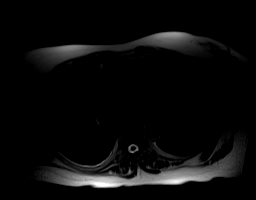

[Series 7: ep2d_diff_b50_500_800_p2 · axial · 6.0mm · 2.08mm/px · z∈[-175,+101]mm · 4 of 123 slices shown]
[im 1/123]
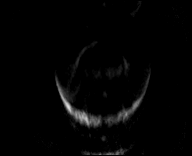
[im 41/123]
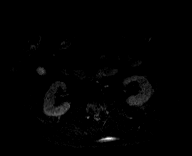
[im 82/123]
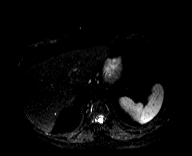
[im 123/123]
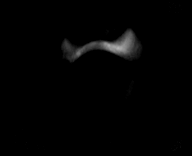

[Series 8: ep2d_diff_b50_500_800_p2_adc · axial · 6.0mm · 2.08mm/px · 1 of 41 slices shown]
[im 1/41]
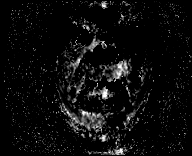

[Series 9: axial in out · axial · 6.0mm · 0.78mm/px · z∈[-148,+93]mm · 2 of 72 slices shown]
[im 1/72]
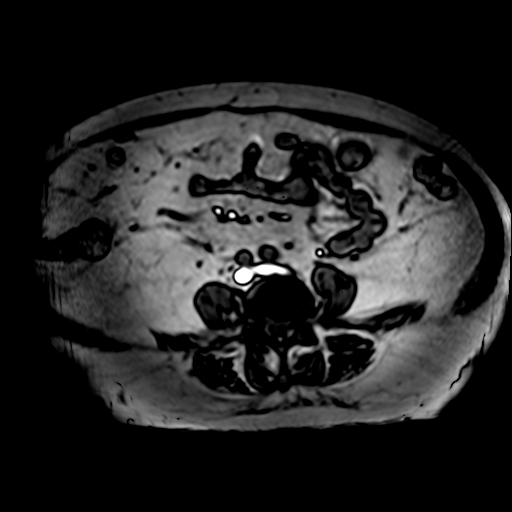
[im 72/72]
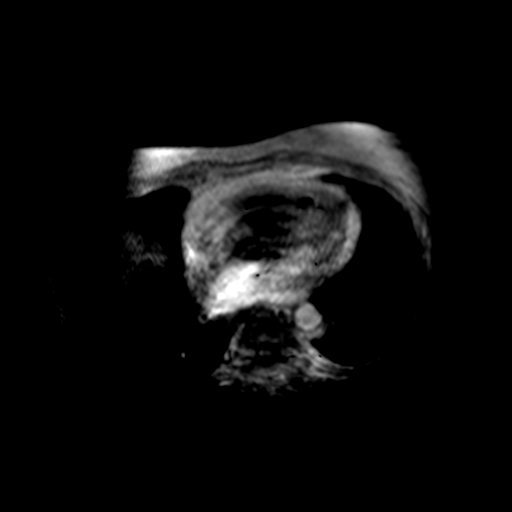

[Series 10: axial tru fisp · axial · 3.5mm · 1.56mm/px · 1 of 44 slices shown]
[im 1/44]
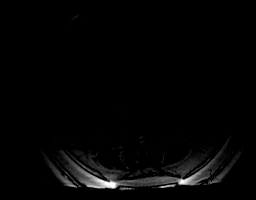

[Series 13: cor haste fs · coronal · 3.0mm · 0.70mm/px · 1 of 19 slices shown]
[im 1/19]
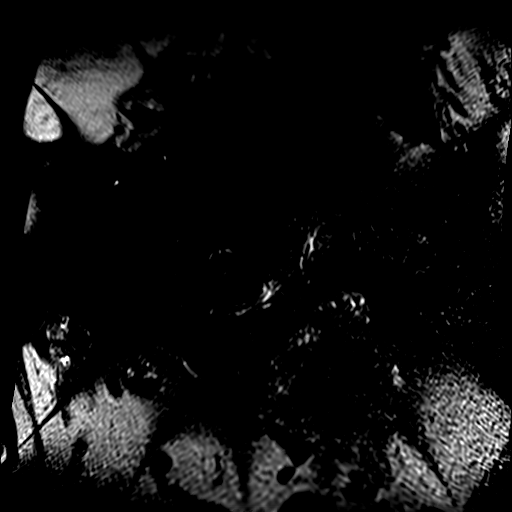

[Series 14: t2_haste_fs_thick obl#1 · coronal · 60.0mm · 0.99mm/px · 1 of 6 slices shown]
[im 1/6]
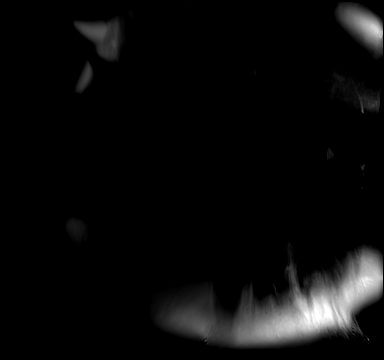

[Series 15: T1 dynamic · axial · non-contrast · 2.5mm · 0.78mm/px · z∈[-159,+99]mm · 3 of 104 slices shown]
[im 1/104]
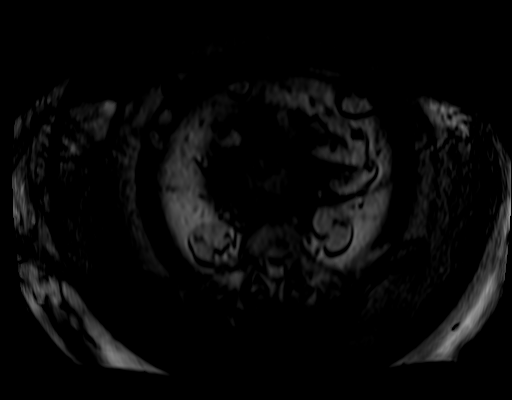
[im 52/104]
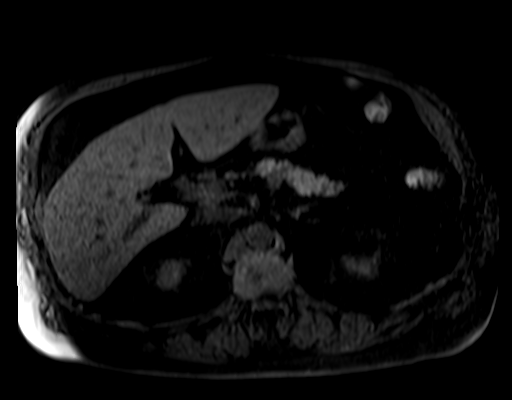
[im 104/104]
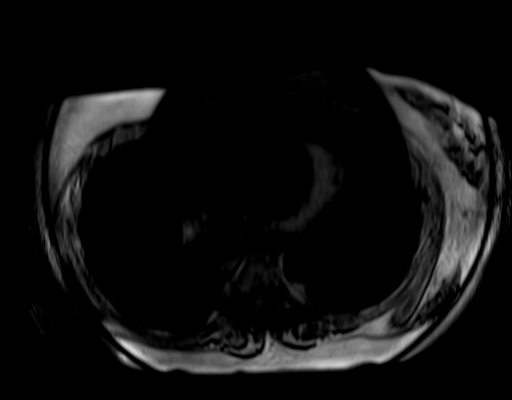

[Series 16: post 25 sec · axial · 2.5mm · 0.78mm/px · z∈[-159,+99]mm · 3 of 104 slices shown]
[im 1/104]
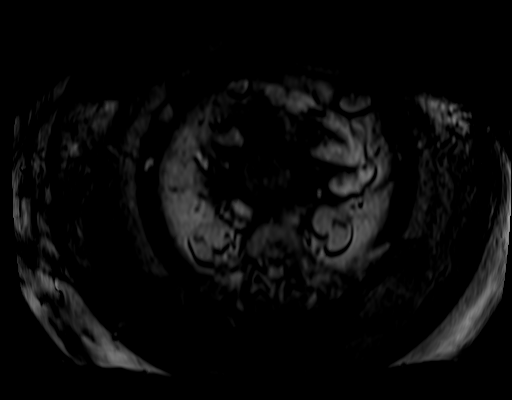
[im 52/104]
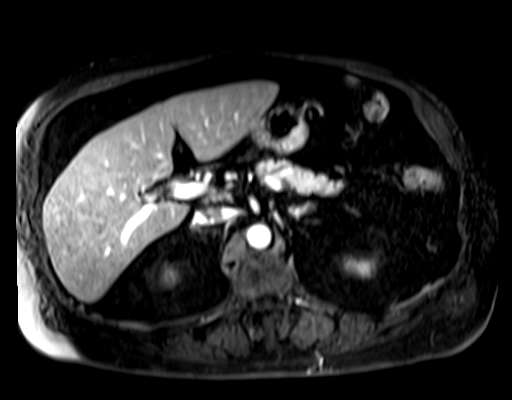
[im 104/104]
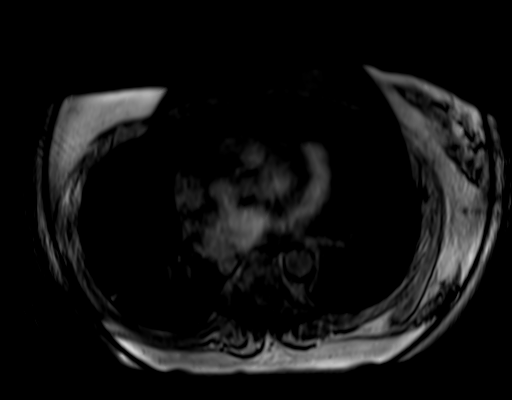

[Series 17: post 25 sec_sub · axial · 2.5mm · 0.78mm/px · z∈[-159,+99]mm · 3 of 104 slices shown]
[im 1/104]
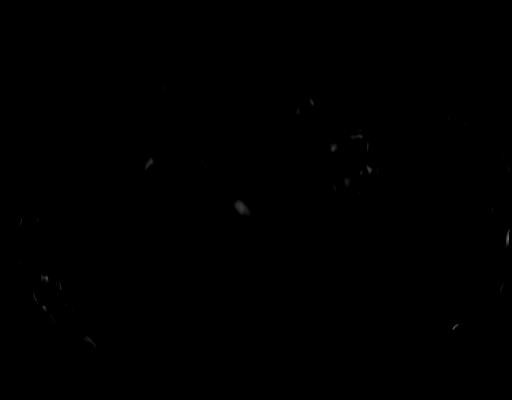
[im 52/104]
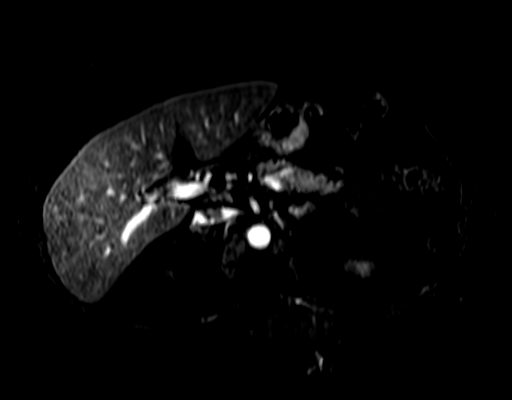
[im 104/104]
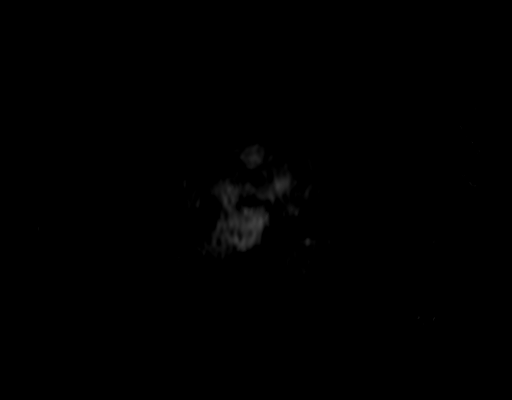

[Series 18: post 45 sec · axial · 2.5mm · 0.78mm/px · z∈[-159,+99]mm · 3 of 104 slices shown]
[im 1/104]
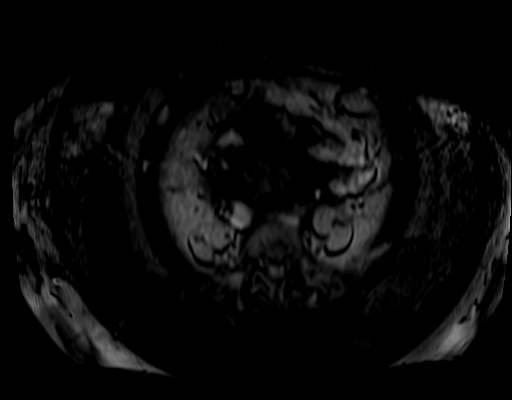
[im 52/104]
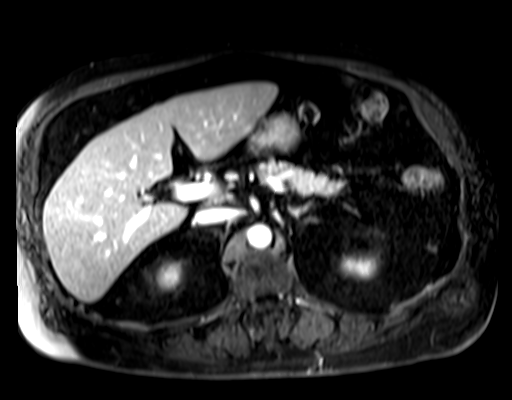
[im 104/104]
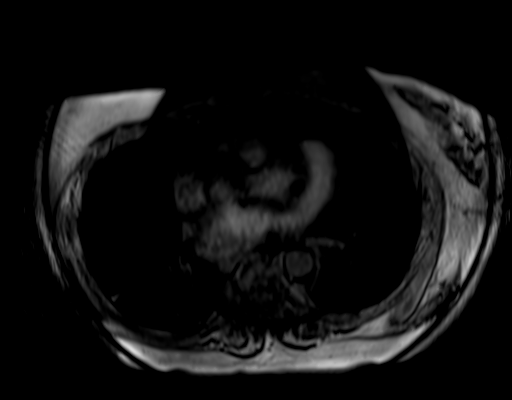

[Series 19: post 45 sec_sub · axial · 2.5mm · 0.78mm/px · z∈[-159,-31]mm · 2 of 104 slices shown]
[im 1/104]
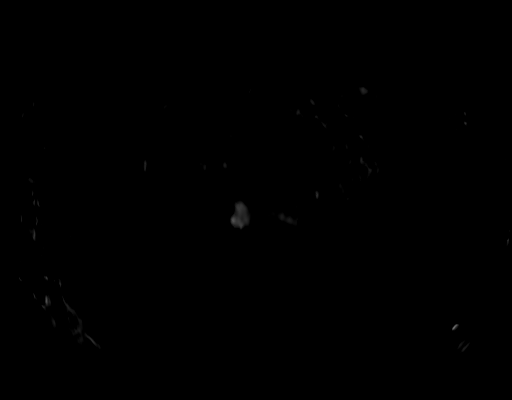
[im 52/104]
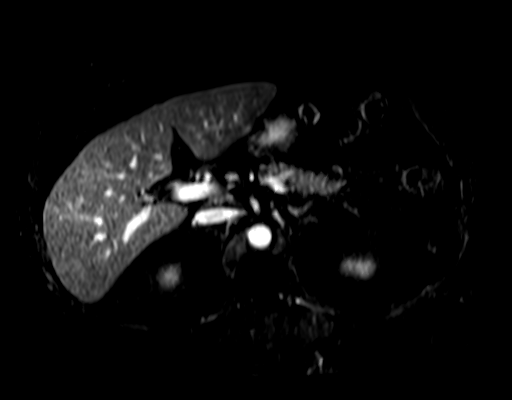

[30 of 48 positions shown; findings below may reference images not displayed]

FINDINGS: Lower chest: No acute findings.

Hepatobiliary: Mild diffuse hepatic steatosis. No suspicious hepatic
lesions. Gallbladder is unremarkable. No biliary ductal dilatation.

Pancreas: No pancreatic ductal dilatation. No acute inflammatory
changes. There is a 5 mm cystic lesion in the tail the pancreas
(series 5, image 17) without suspicious enhancing features.

Spleen:  Within normal limits in size and appearance.

Adrenals/Urinary Tract: Bilateral adrenal glands are unremarkable.
Solid 1.8 cm right upper pole renal lesion (series 5, image 22) with
in the ink artifact on out of phase imaging and postcontrast
enhancement on delayed imaging findings consistent with a renal
angiomyolipoma. No suspicious renal lesions.

Stomach/Bowel: Visualized portions within the abdomen are
unremarkable.

Vascular/Lymphatic: No pathologically enlarged lymph nodes
identified. No abdominal aortic aneurysm demonstrated.

Other:  None.

Musculoskeletal: No suspicious bone lesions identified.
IMPRESSION: 1. Right upper pole 1.8 cm renal angiomyolipoma.
2. Mild diffuse hepatic steatosis.
3. 5 mm cystic lesion in the tail the pancreas without suspicious
enhancing features. This is likely a small side branch IPMN or less
likely sequela prior inflammation (pseudocyst). Recommend follow up
pre and post contrast MRI/MRCP or pancreatic protocol CT in 1 year.
This recommendation follows ACR consensus guidelines: Management of
Incidental Pancreatic Cysts: A White Paper of the ACR Incidental
Findings Committee. [HOSPITAL] [1E];[DATE].

## 2020-11-04 MED ORDER — GADOBENATE DIMEGLUMINE 529 MG/ML IV SOLN
18.0000 mL | Freq: Once | INTRAVENOUS | Status: AC | PRN
  Administered 2020-11-04: 18 mL via INTRAVENOUS

## 2020-11-06 ENCOUNTER — Other Ambulatory Visit: Payer: Self-pay | Admitting: Gastroenterology

## 2020-11-06 ENCOUNTER — Other Ambulatory Visit (HOSPITAL_COMMUNITY): Payer: Self-pay | Admitting: Gastroenterology

## 2020-11-06 DIAGNOSIS — R1011 Right upper quadrant pain: Secondary | ICD-10-CM

## 2020-12-04 ENCOUNTER — Ambulatory Visit (HOSPITAL_COMMUNITY)
Admission: RE | Admit: 2020-12-04 | Discharge: 2020-12-04 | Disposition: A | Discharge status: Home / Self Care | Payer: BC Managed Care – PPO | Source: Ambulatory Visit | Attending: Gastroenterology | Admitting: Gastroenterology

## 2020-12-04 ENCOUNTER — Encounter (HOSPITAL_COMMUNITY)
Admission: RE | Admit: 2020-12-04 | Discharge: 2020-12-04 | Disposition: A | Discharge status: Home / Self Care | Payer: BC Managed Care – PPO | Source: Ambulatory Visit | Attending: Gastroenterology | Admitting: Gastroenterology

## 2020-12-04 ENCOUNTER — Other Ambulatory Visit: Payer: Self-pay

## 2020-12-04 DIAGNOSIS — R1011 Right upper quadrant pain: Secondary | ICD-10-CM | POA: Diagnosis present

## 2020-12-04 IMAGING — NM NM HEPATO W/GB/PHARM/[PERSON_NAME]
2 series · 12 of 12 positions shown · non-contrast
Comparison: Ultrasound dated [DATE]

CLINICAL DATA: Right upper quadrant abdominal pain

EXAM:
NUCLEAR MEDICINE HEPATOBILIARY IMAGING WITH GALLBLADDER EF
TECHNIQUE: Sequential images of the abdomen were obtained [DATE] minutes
following intravenous administration of radiopharmaceutical. After
oral ingestion of Ensure, gallbladder ejection fraction was
determined. At 60 min, normal ejection fraction is greater than 33%.
RADIOPHARMACEUTICALS:  5.25 mCi [XX]  Choletec IV

[he hepatobiliary · 4.52mm/px · 6 of 60 frames shown (1 of 2)]
[frame 6/60]
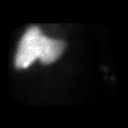
[frame 16/60]
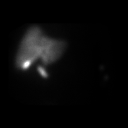
[frame 26/60]
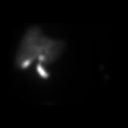
[frame 36/60]
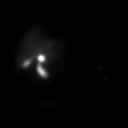
[frame 46/60]
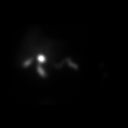
[frame 56/60]
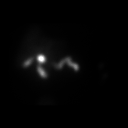

[he hepatobiliary · 4.52mm/px · 6 of 60 frames shown (2 of 2)]
[frame 6/60]
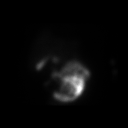
[frame 16/60]
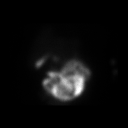
[frame 26/60]
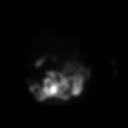
[frame 36/60]
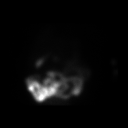
[frame 46/60]
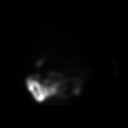
[frame 56/60]
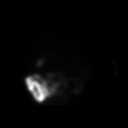

[12 of 12 positions shown; findings below may reference images not displayed]

FINDINGS: Prompt uptake and biliary excretion of activity by the liver is
seen. Gallbladder activity is visualized, consistent with patency of
cystic duct. Biliary activity passes into small bowel, consistent
with patent common bile duct.

Calculated gallbladder ejection fraction is 49%. (Normal gallbladder
ejection fraction with Ensure is greater than 33%.)
IMPRESSION: Unremarkable examination.

## 2020-12-04 IMAGING — US US ABDOMEN LIMITED RUQ/ASCITES
1 series · 14 of 25 positions shown · non-contrast
Comparison: MRI abdomen [DATE]

CLINICAL DATA: Right upper quadrant pain

EXAM:
ULTRASOUND ABDOMEN LIMITED RIGHT UPPER QUADRANT

[Series 1: us abdomen limited ruq (liver/gb) · 14 of 60 slices shown]
[im 1/60]
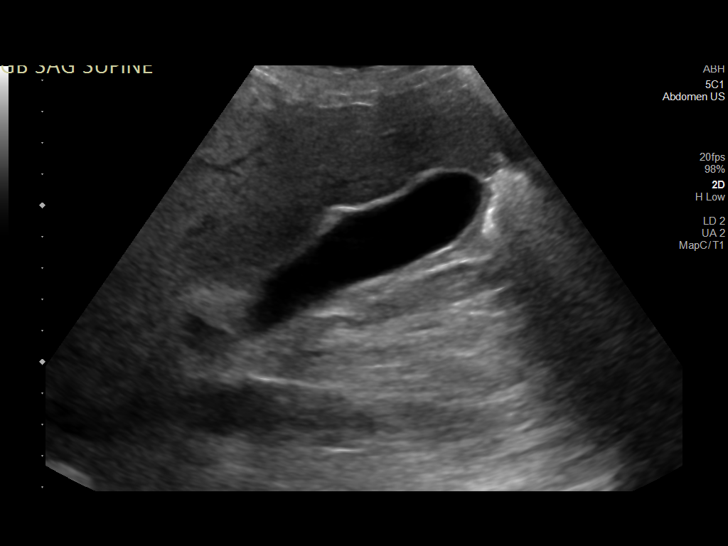
[im 5/60]
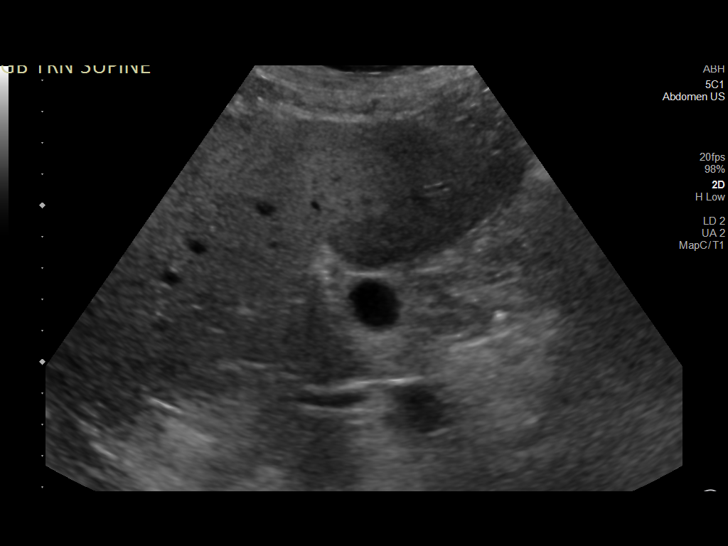
[im 10/60]
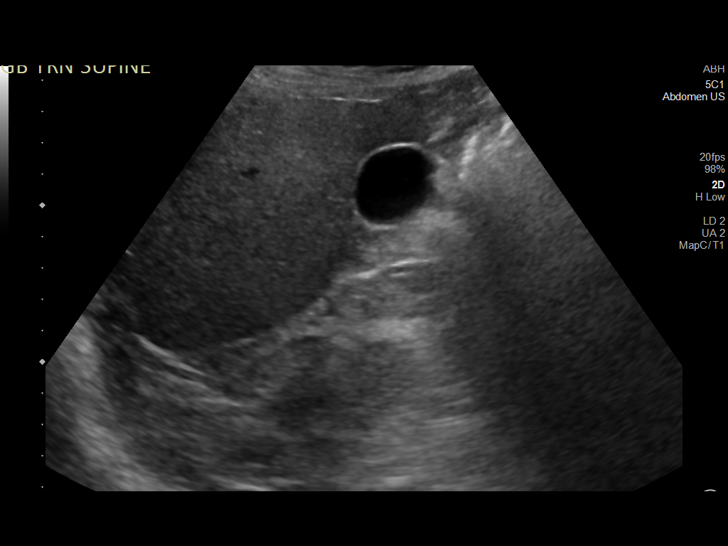
[im 15/60]
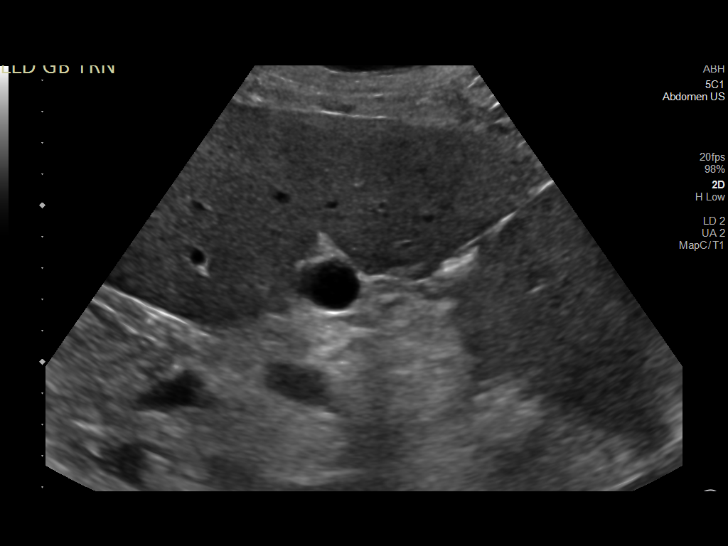
[im 20/60]
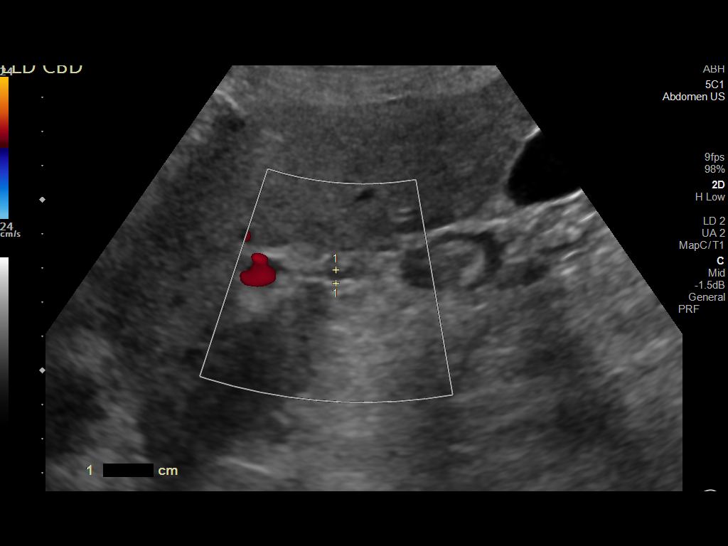
[im 23/60]
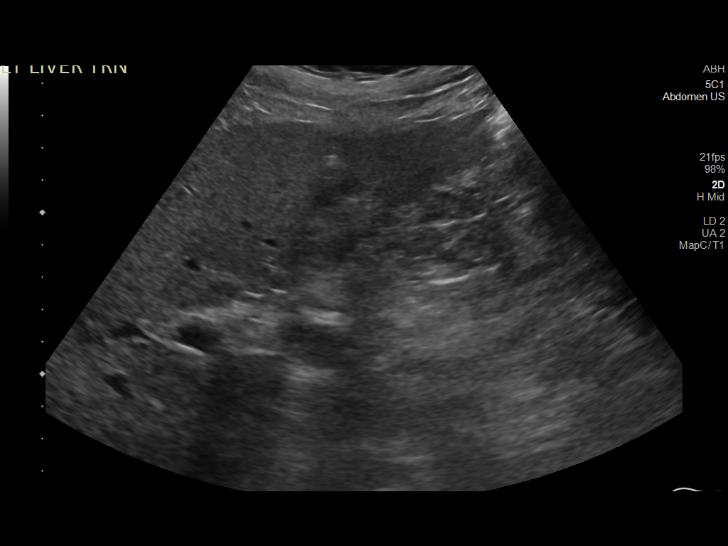
[im 28/60]
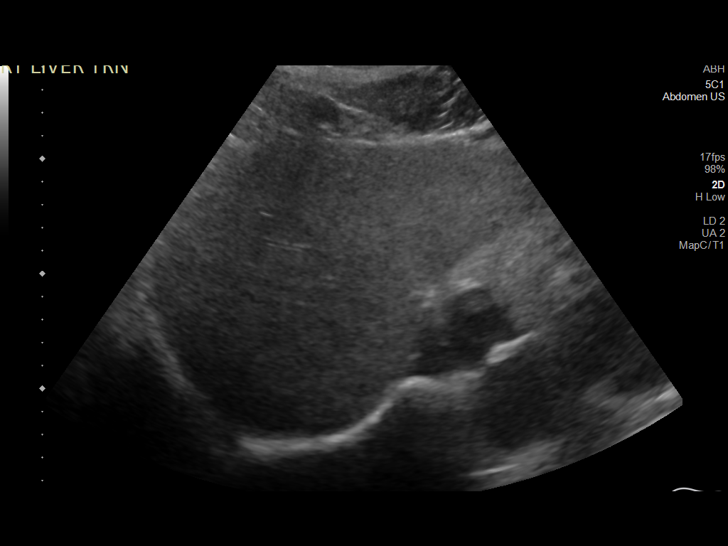
[im 32/60]
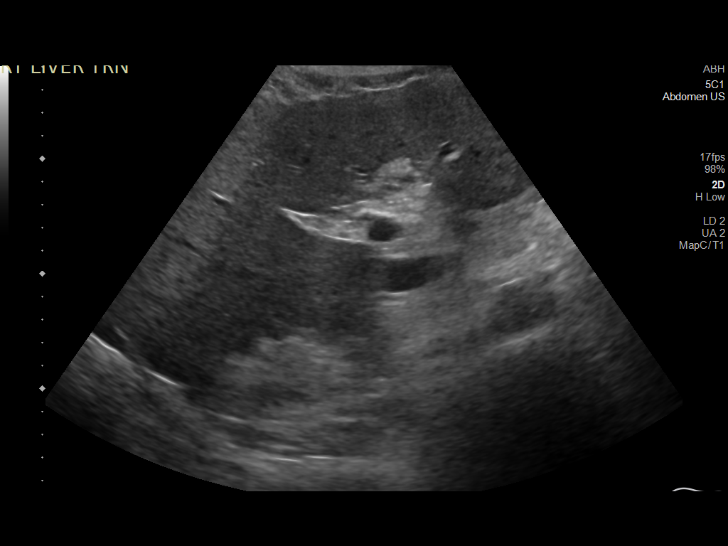
[im 37/60]
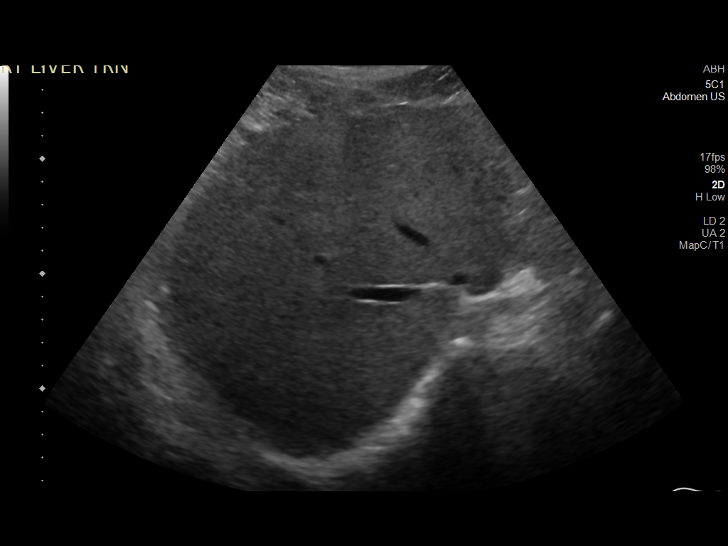
[im 40/60]
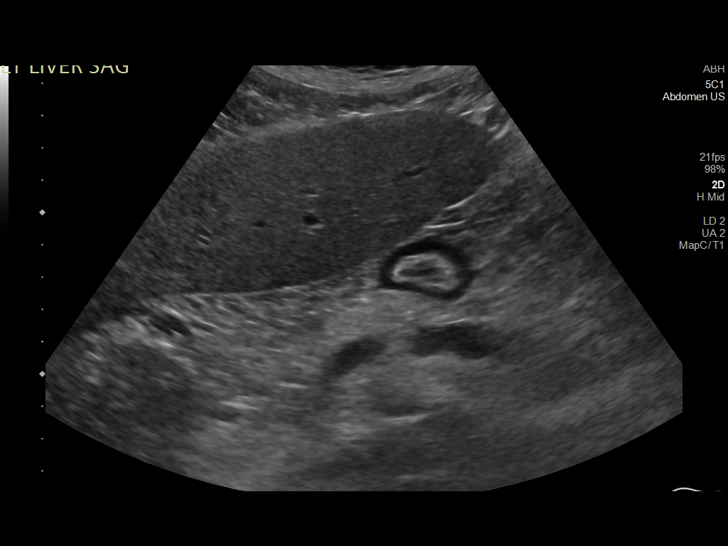
[im 45/60]
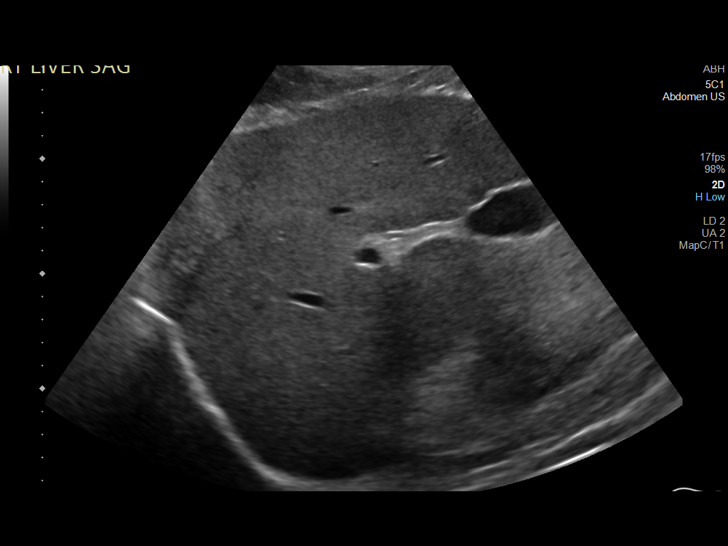
[im 50/60]
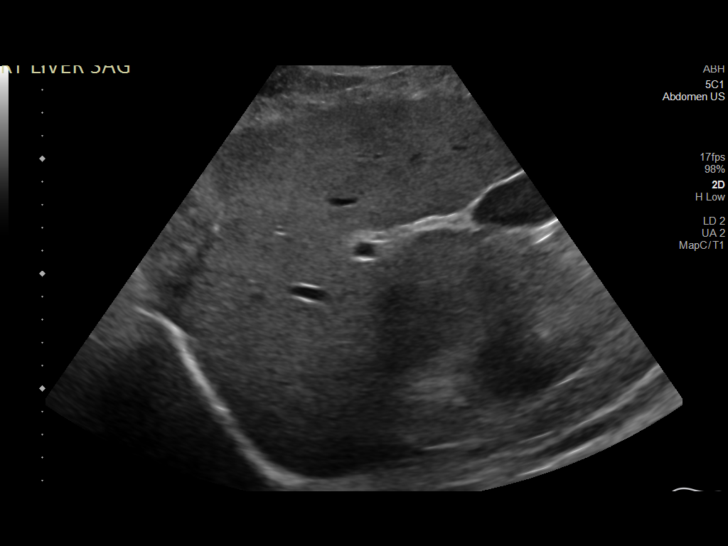
[im 55/60]
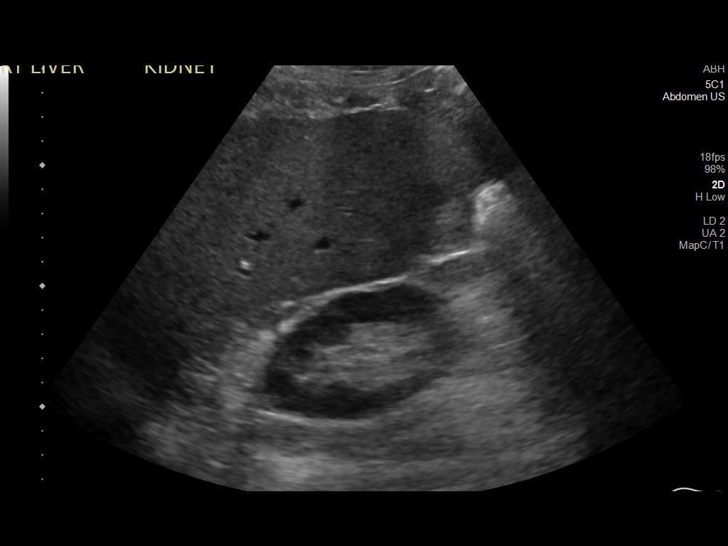
[im 60/60]
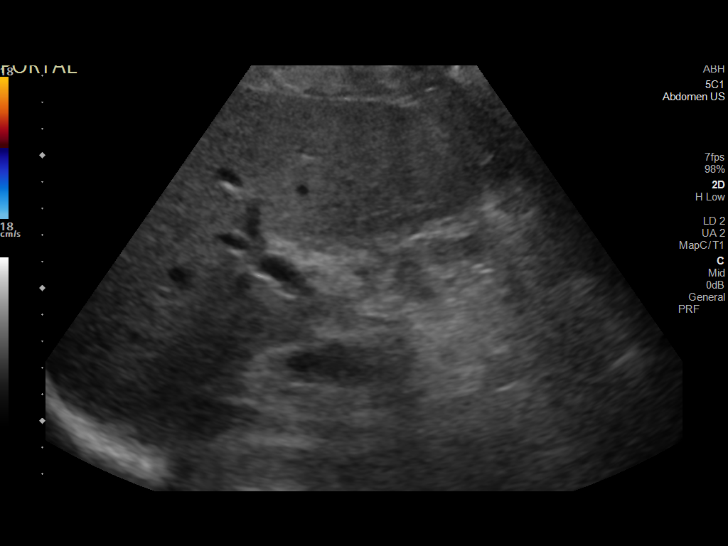

[14 of 25 positions shown; findings below may reference images not displayed]

FINDINGS: Gallbladder:

No gallstones or wall thickening visualized. No sonographic Murphy
sign noted by sonographer.

Common bile duct:

Diameter: 4 mm

Liver:

No focal lesion identified. Within normal limits in parenchymal
echogenicity. Portal vein is patent on color Doppler imaging with
normal direction of blood flow towards the liver.

Other: None.
IMPRESSION: No significant sonographic abnormality of the gallbladder or liver.

## 2020-12-04 MED ORDER — TECHNETIUM TC 99M MEBROFENIN IV KIT
5.0000 | PACK | Freq: Once | INTRAVENOUS | Status: AC | PRN
  Administered 2020-12-04: 5 via INTRAVENOUS

## 2021-01-22 ENCOUNTER — Other Ambulatory Visit: Payer: Self-pay | Admitting: Orthopedic Surgery

## 2021-02-04 ENCOUNTER — Other Ambulatory Visit: Payer: Self-pay

## 2021-02-04 ENCOUNTER — Encounter (HOSPITAL_COMMUNITY)
Admission: RE | Admit: 2021-02-04 | Discharge: 2021-02-04 | Disposition: A | Discharge status: Home / Self Care | Payer: Managed Care, Other (non HMO) | Source: Ambulatory Visit | Attending: Orthopedic Surgery | Admitting: Orthopedic Surgery

## 2021-02-04 ENCOUNTER — Encounter (HOSPITAL_COMMUNITY): Payer: Self-pay

## 2021-02-04 DIAGNOSIS — Z01812 Encounter for preprocedural laboratory examination: Secondary | ICD-10-CM | POA: Diagnosis present

## 2021-02-04 DIAGNOSIS — Z20822 Contact with and (suspected) exposure to covid-19: Secondary | ICD-10-CM | POA: Diagnosis not present

## 2021-02-04 LAB — TYPE AND SCREEN
ABO/RH(D): A POS
Antibody Screen: NEGATIVE

## 2021-02-04 LAB — CBC WITH DIFFERENTIAL/PLATELET
Abs Immature Granulocytes: 0.02 10*3/uL (ref 0.00–0.07)
Basophils Absolute: 0.1 10*3/uL (ref 0.0–0.1)
Basophils Relative: 1 %
Eosinophils Absolute: 0.3 10*3/uL (ref 0.0–0.5)
Eosinophils Relative: 4 %
HCT: 37.9 % (ref 36.0–46.0)
Hemoglobin: 12.6 g/dL (ref 12.0–15.0)
Immature Granulocytes: 0 %
Lymphocytes Relative: 38 %
Lymphs Abs: 2.7 10*3/uL (ref 0.7–4.0)
MCH: 33.5 pg (ref 26.0–34.0)
MCHC: 33.2 g/dL (ref 30.0–36.0)
MCV: 100.8 fL — ABNORMAL HIGH (ref 80.0–100.0)
Monocytes Absolute: 0.5 10*3/uL (ref 0.1–1.0)
Monocytes Relative: 8 %
Neutro Abs: 3.5 10*3/uL (ref 1.7–7.7)
Neutrophils Relative %: 49 %
Platelets: 420 10*3/uL — ABNORMAL HIGH (ref 150–400)
RBC: 3.76 MIL/uL — ABNORMAL LOW (ref 3.87–5.11)
RDW: 15.4 % (ref 11.5–15.5)
WBC: 7 10*3/uL (ref 4.0–10.5)
nRBC: 0 % (ref 0.0–0.2)

## 2021-02-04 LAB — COMPREHENSIVE METABOLIC PANEL
ALT: 20 U/L (ref 0–44)
AST: 28 U/L (ref 15–41)
Albumin: 4 g/dL (ref 3.5–5.0)
Alkaline Phosphatase: 121 U/L (ref 38–126)
Anion gap: 11 (ref 5–15)
BUN: 11 mg/dL (ref 6–20)
CO2: 23 mmol/L (ref 22–32)
Calcium: 9.3 mg/dL (ref 8.9–10.3)
Chloride: 101 mmol/L (ref 98–111)
Creatinine, Ser: 0.83 mg/dL (ref 0.44–1.00)
GFR, Estimated: >60 mL/min (ref >60)
Glucose, Bld: 111 mg/dL — ABNORMAL HIGH (ref 70–99)
Potassium: 4 mmol/L (ref 3.5–5.1)
Sodium: 135 mmol/L (ref 135–145)
Total Bilirubin: 0.7 mg/dL (ref 0.3–1.2)
Total Protein: 7 g/dL (ref 6.5–8.1)

## 2021-02-04 LAB — SARS CORONAVIRUS 2 (TAT 6-24 HRS): SARS Coronavirus 2: NEGATIVE

## 2021-02-04 LAB — URINALYSIS, ROUTINE W REFLEX MICROSCOPIC
Bilirubin Urine: NEGATIVE
Glucose, UA: NEGATIVE mg/dL
Ketones, ur: NEGATIVE mg/dL
Nitrite: NEGATIVE
Protein, ur: NEGATIVE mg/dL
Specific Gravity, Urine: 1.015 (ref 1.005–1.030)
pH: 5 (ref 5.0–8.0)

## 2021-02-04 LAB — APTT: aPTT: 27 seconds (ref 24–36)

## 2021-02-04 LAB — PROTIME-INR
INR: 1 (ref 0.8–1.2)
Prothrombin Time: 13.4 seconds (ref 11.4–15.2)

## 2021-02-04 LAB — SURGICAL PCR SCREEN
MRSA, PCR: NEGATIVE
Staphylococcus aureus: NEGATIVE

## 2021-02-04 NOTE — Progress Notes (Signed)
Your procedure is scheduled on Tuesday May 10th.  Report to [facility] Main Entrance "A" at 11:30 A.M., and check in at the Admitting office.  Call this number if you have problems the morning of surgery: [phone]  Call [phone] if you have any questions prior to your surgery date Monday-Friday 8am-4pm   Remember: Do not eat after midnight the night before your surgery  You may drink clear liquids until 10:30 A.M. the morning of your surgery.   Clear liquids allowed are: Water, Non-Citrus Juices (without pulp), Carbonated Beverages, Clear Tea, Black Coffee Only, and Gatorade  Please complete your PRE-SURGERY ENSURE that was provided to you by 10:30 A.M. the morning of your surgery. Please, if able, drink it in one setting. DO NOT SIP.    Take these medicines the morning of surgery with A SIP OF WATER: atorvastatin (LIPITOR)  desvenlafaxine (PRISTIQ) fenofibrate  omeprazole (PRILOSEC)  If needed: acetaminophen (TYLENOL) ALPRAZolam (XANAX)  ondansetron (ZOFRAN-ODT) promethazine (PHENERGAN)  tiZANidine (ZANAFLEX)  As of today, STOP taking any Aspirin (unless otherwise instructed by your surgeon), Aleve, Naproxen, Ibuprofen, Motrin, Advil, Goody's, BC's, all herbal medications, fish oil, and all vitamins.    The Morning of Surgery  Do not wear jewelry, make-up or nail polish.  Do not wear lotions, powders, or perfumes, or deodorant  Do not shave 48 hours prior to surgery.    Do not bring valuables to the hospital.  [facility] is not responsible for any belongings or valuables.  If you are a smoker, DO NOT Smoke 24 hours prior to surgery  If you wear a CPAP at night please bring your mask the morning of surgery   Remember that you must have someone to transport you home after your surgery, and remain with you for 24 hours if you are discharged the same day.   Please bring cases for contacts, glasses, hearing aids, dentures or bridgework because it cannot be worn  into surgery.    Leave your suitcase in the car.  After surgery it may be brought to your room.  For patients admitted to the hospital, discharge time will be determined by your treatment team.  Patients discharged the day of surgery will not be allowed to drive home.    Special instructions:   [place]- Preparing For Surgery  Before surgery, you can play an important role. Because skin is not sterile, your skin needs to be as free of germs as possible. You can reduce the number of germs on your skin by washing with CHG (chlorahexidine gluconate) Soap before surgery.  CHG is an antiseptic cleaner which kills germs and bonds with the skin to continue killing germs even after washing.    Oral Hygiene is also important to reduce your risk of infection.  Remember - BRUSH YOUR TEETH THE MORNING OF SURGERY WITH YOUR REGULAR TOOTHPASTE  Please do not use if you have an allergy to CHG or antibacterial soaps. If your skin becomes reddened/irritated stop using the CHG.  Do not shave (including legs and underarms) for at least 48 hours prior to first CHG shower. It is OK to shave your face.  Please follow these instructions carefully.   1. Shower the NIGHT BEFORE SURGERY and the MORNING OF SURGERY with CHG Soap.   2. If you chose to wash your hair and body, wash as usual with your normal shampoo and body-wash/soap.  3. Rinse your hair and body thoroughly to remove the shampoo and soap.  4. Apply CHG  directly to the skin (ONLY FROM THE NECK DOWN) and wash gently with a scrungie or a clean washcloth.   5. Do not use on open wounds or open sores. Avoid contact with your eyes, ears, mouth and genitals (private parts). Wash Face and genitals (private parts)  with your normal soap.   6. Wash thoroughly, paying special attention to the area where your surgery will be performed.  7. Thoroughly rinse your body with warm water from the neck down.  8. DO NOT shower/wash with your normal soap after  using and rinsing off the CHG Soap.  9. Pat yourself dry with a CLEAN TOWEL.  10. Wear CLEAN PAJAMAS to bed the night before surgery  11. Place CLEAN SHEETS on your bed the night of your first shower and DO NOT SLEEP WITH PETS.  12. Wear comfortable clothes the morning of surgery.     Day of Surgery:  Please shower the morning of surgery with the CHG soap Do not apply any deodorants/lotions. Please wear clean clothes to the hospital/surgery center.   Remember to brush your teeth WITH YOUR REGULAR TOOTHPASTE.   Please read over the following fact sheets that you were given.

## 2021-02-04 NOTE — Progress Notes (Signed)
PCP - Dr. Melford Aase Cardiologist - denies  Chest x-ray - n/a EKG - 10/23/20 Stress Test -  ECHO -  Cardiac Cath -   ERAS Protcol - yes PRE-SURGERY Ensure or G2- ensure  COVID TEST- PAT 02/04/21   Anesthesia review: n/a  Patient denies shortness of breath, fever, cough and chest pain at PAT appointment   All instructions explained to the patient, with a verbal understanding of the material. Patient agrees to go over the instructions while at home for a better understanding. Patient also instructed to self quarantine after being tested for COVID-19. The opportunity to ask questions was provided.

## 2021-02-05 ENCOUNTER — Other Ambulatory Visit (HOSPITAL_BASED_OUTPATIENT_CLINIC_OR_DEPARTMENT_OTHER): Payer: Self-pay

## 2021-02-05 ENCOUNTER — Ambulatory Visit (HOSPITAL_COMMUNITY): Payer: Managed Care, Other (non HMO) | Admitting: Anesthesiology

## 2021-02-05 ENCOUNTER — Ambulatory Visit (HOSPITAL_COMMUNITY): Payer: Managed Care, Other (non HMO)

## 2021-02-05 ENCOUNTER — Encounter (HOSPITAL_COMMUNITY)
Admission: RE | Disposition: A | Discharge status: Home / Self Care | Payer: Self-pay | Source: Home / Self Care | Attending: Orthopedic Surgery

## 2021-02-05 ENCOUNTER — Ambulatory Visit (HOSPITAL_COMMUNITY)
Admission: RE | Admit: 2021-02-05 | Discharge: 2021-02-05 | Disposition: A | Discharge status: Home / Self Care | Payer: Managed Care, Other (non HMO) | Attending: Orthopedic Surgery | Admitting: Orthopedic Surgery

## 2021-02-05 ENCOUNTER — Encounter (HOSPITAL_COMMUNITY): Payer: Self-pay | Admitting: Orthopedic Surgery

## 2021-02-05 ENCOUNTER — Other Ambulatory Visit: Payer: Self-pay

## 2021-02-05 DIAGNOSIS — Z419 Encounter for procedure for purposes other than remedying health state, unspecified: Secondary | ICD-10-CM

## 2021-02-05 DIAGNOSIS — J45909 Unspecified asthma, uncomplicated: Secondary | ICD-10-CM | POA: Insufficient documentation

## 2021-02-05 DIAGNOSIS — Z9889 Other specified postprocedural states: Secondary | ICD-10-CM

## 2021-02-05 DIAGNOSIS — M8008XA Age-related osteoporosis with current pathological fracture, vertebra(e), initial encounter for fracture: Secondary | ICD-10-CM | POA: Diagnosis present

## 2021-02-05 DIAGNOSIS — Z91040 Latex allergy status: Secondary | ICD-10-CM | POA: Insufficient documentation

## 2021-02-05 DIAGNOSIS — I10 Essential (primary) hypertension: Secondary | ICD-10-CM | POA: Diagnosis not present

## 2021-02-05 DIAGNOSIS — M8088XA Other osteoporosis with current pathological fracture, vertebra(e), initial encounter for fracture: Secondary | ICD-10-CM | POA: Insufficient documentation

## 2021-02-05 DIAGNOSIS — Z79899 Other long term (current) drug therapy: Secondary | ICD-10-CM | POA: Insufficient documentation

## 2021-02-05 HISTORY — PX: KYPHOPLASTY: SHX5884

## 2021-02-05 IMAGING — RF DG LUMBAR SPINE 2-3V
1 series · 2 of 2 positions shown · non-contrast
Comparison: Lumbar MRI [DATE]

CLINICAL DATA: L1 and L4 kyphoplasty.

EXAM:
LUMBAR SPINE - 2-3 VIEW; DG C-ARM 1-60 MIN

[Series 1: run · 2 of 2 slices shown]
[im 1/2]
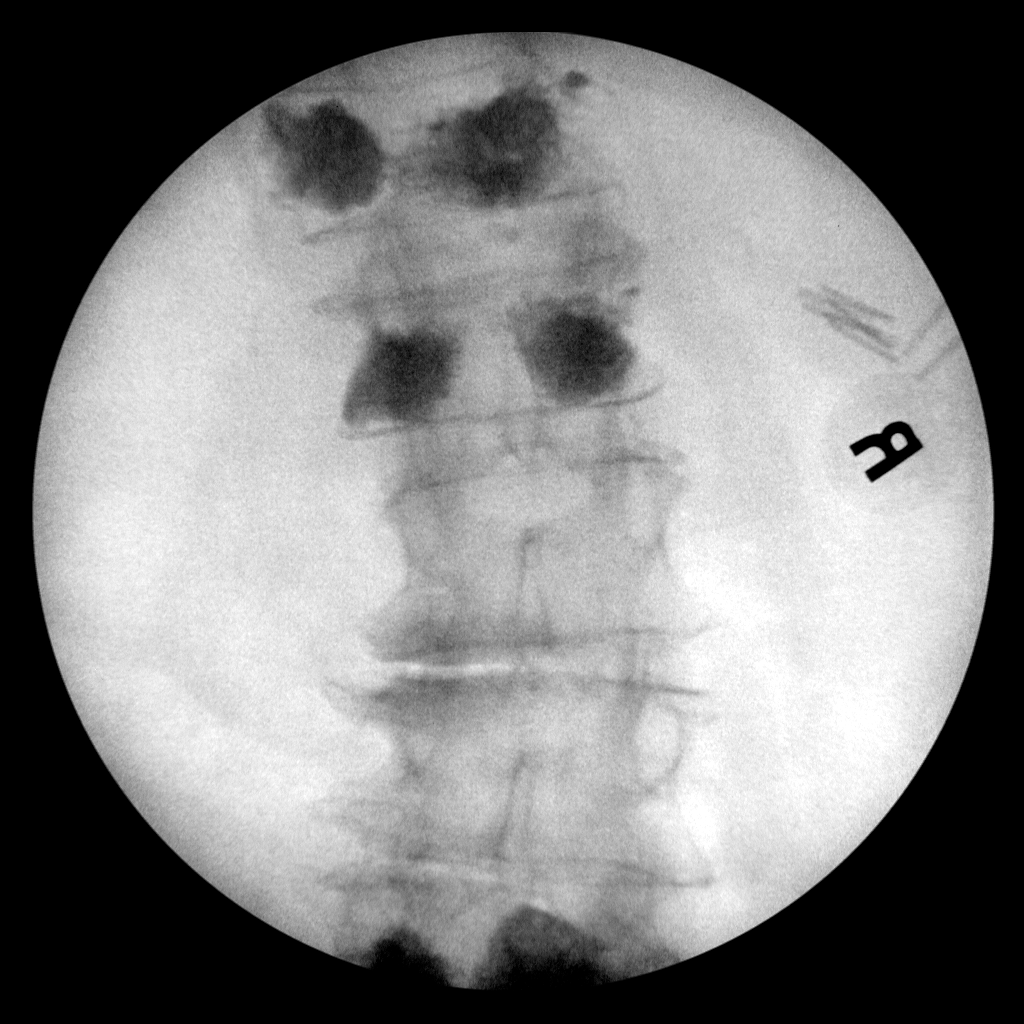
[im 2/2]
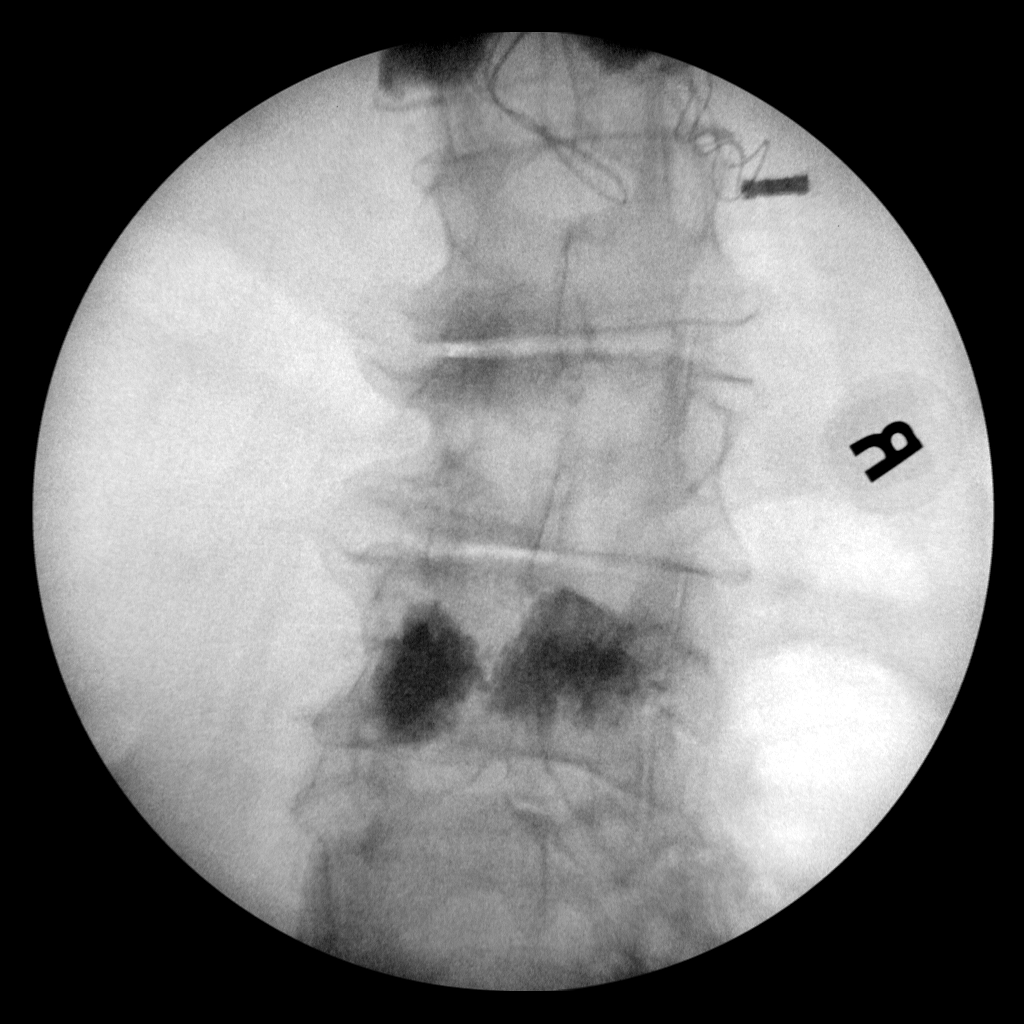

[2 of 2 positions shown; findings below may reference images not displayed]

FINDINGS: Two fluoroscopic spot views obtained of the lumbar spine in the
operating room. Kyphoplasty is visualized within T12, L1, and L4,
detailed level assessment limited due to the limited provided views.
T12 kyphoplasty was present on prior MRI. Total fluoroscopy time 4
minutes 40 seconds. Total dose 216.67 mGy.
IMPRESSION: Fluoroscopic spot views during lumbar kyphoplasty.

## 2021-02-05 IMAGING — RF DG C-ARM 1-60 MIN
2 series · 4 of 4 positions shown · non-contrast
Comparison: Lumbar MRI [DATE]

CLINICAL DATA: L1 and L4 kyphoplasty.

EXAM:
LUMBAR SPINE - 2-3 VIEW; DG C-ARM 1-60 MIN

[Series 1: run · 2 of 2 slices shown (1 of 2)]
[im 1/2]
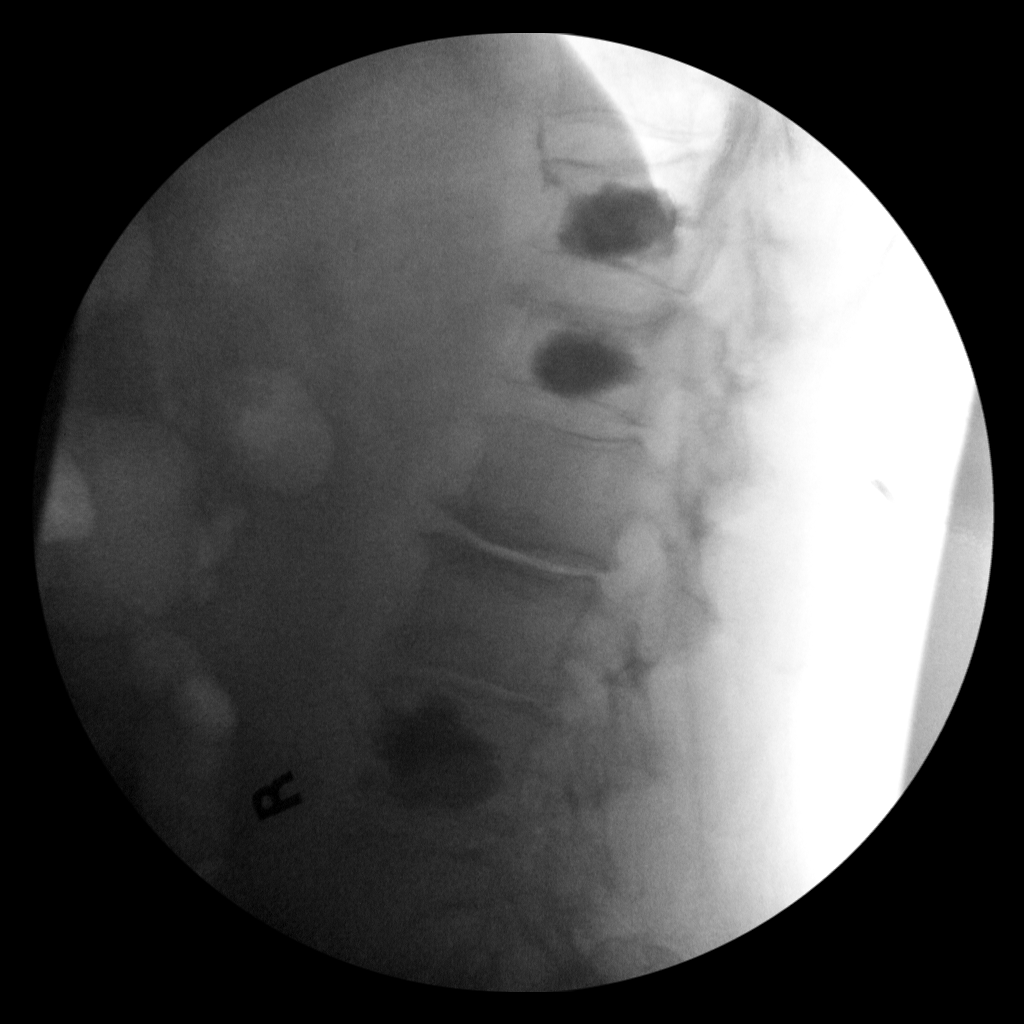
[im 2/2]
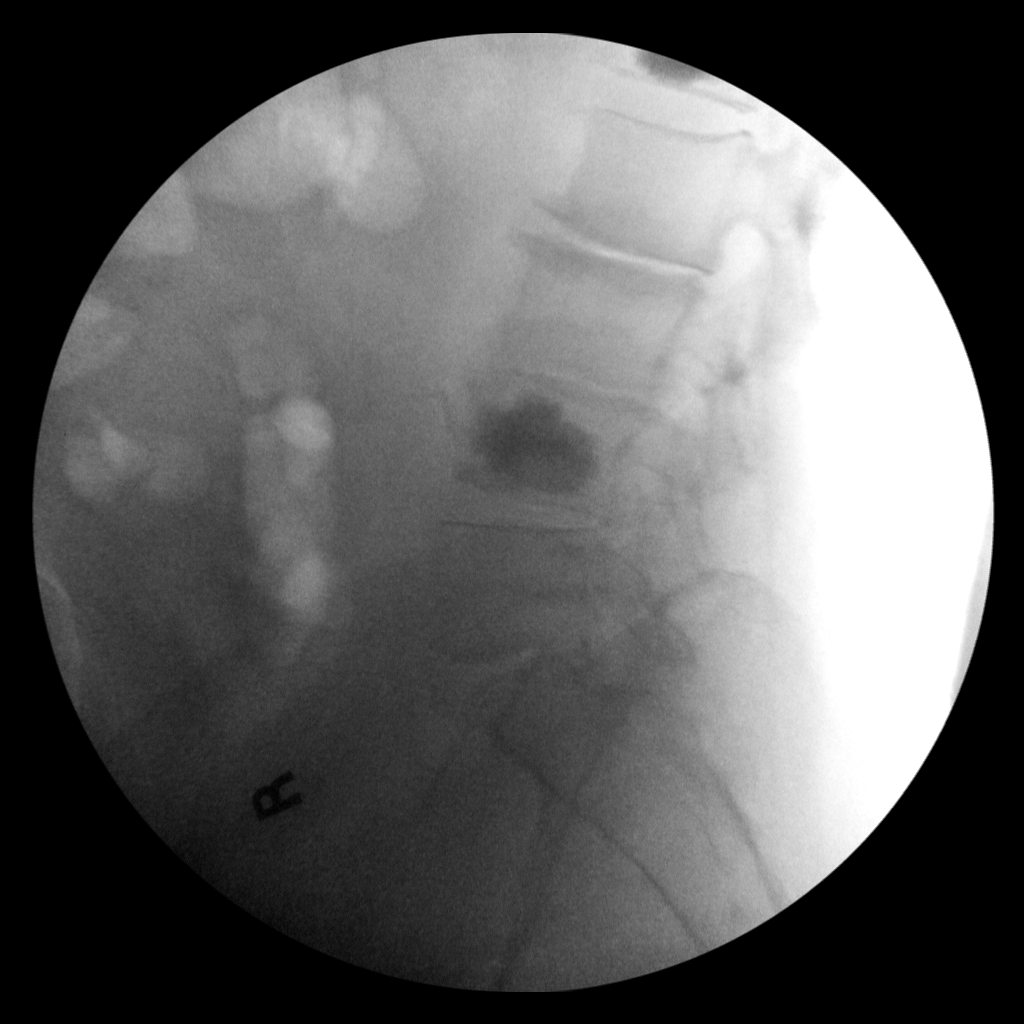

[Series 1: run · 2 of 2 slices shown (2 of 2)]
[im 1/2]
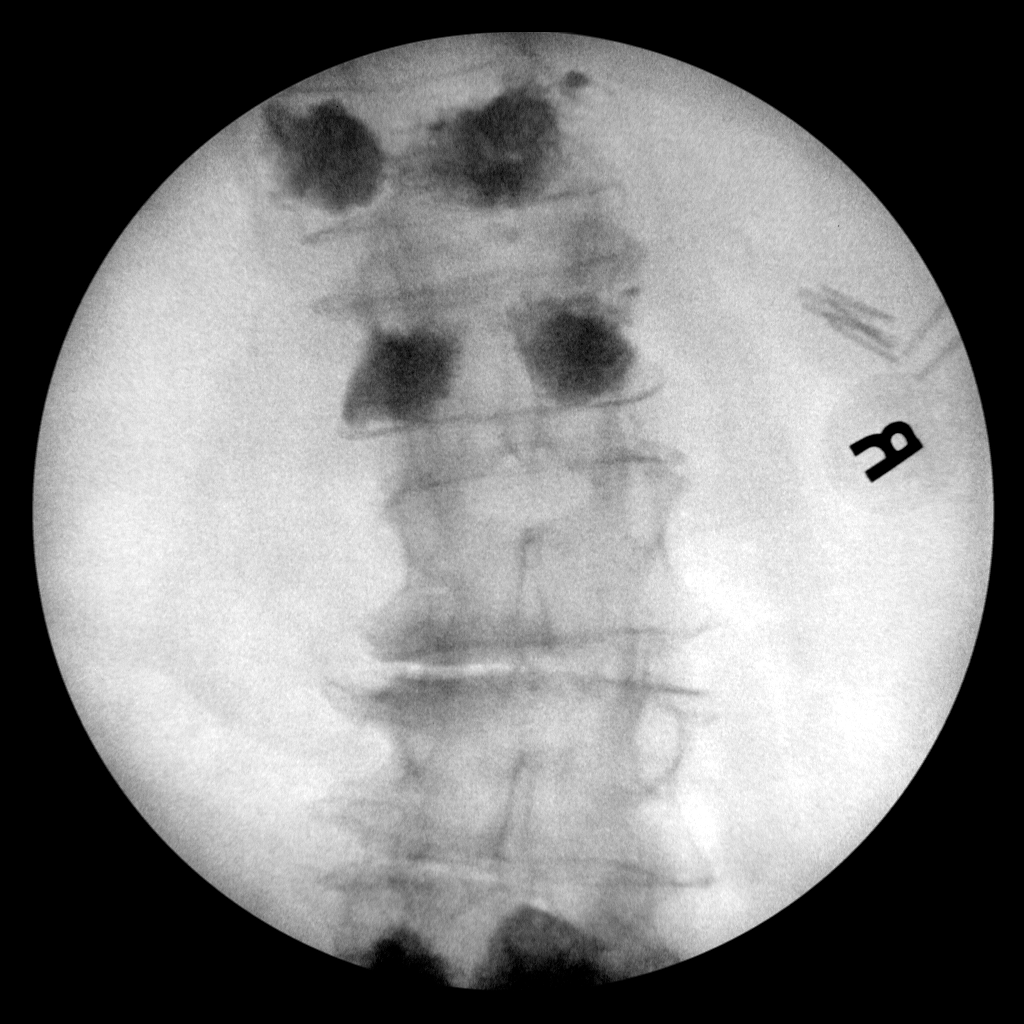
[im 2/2]
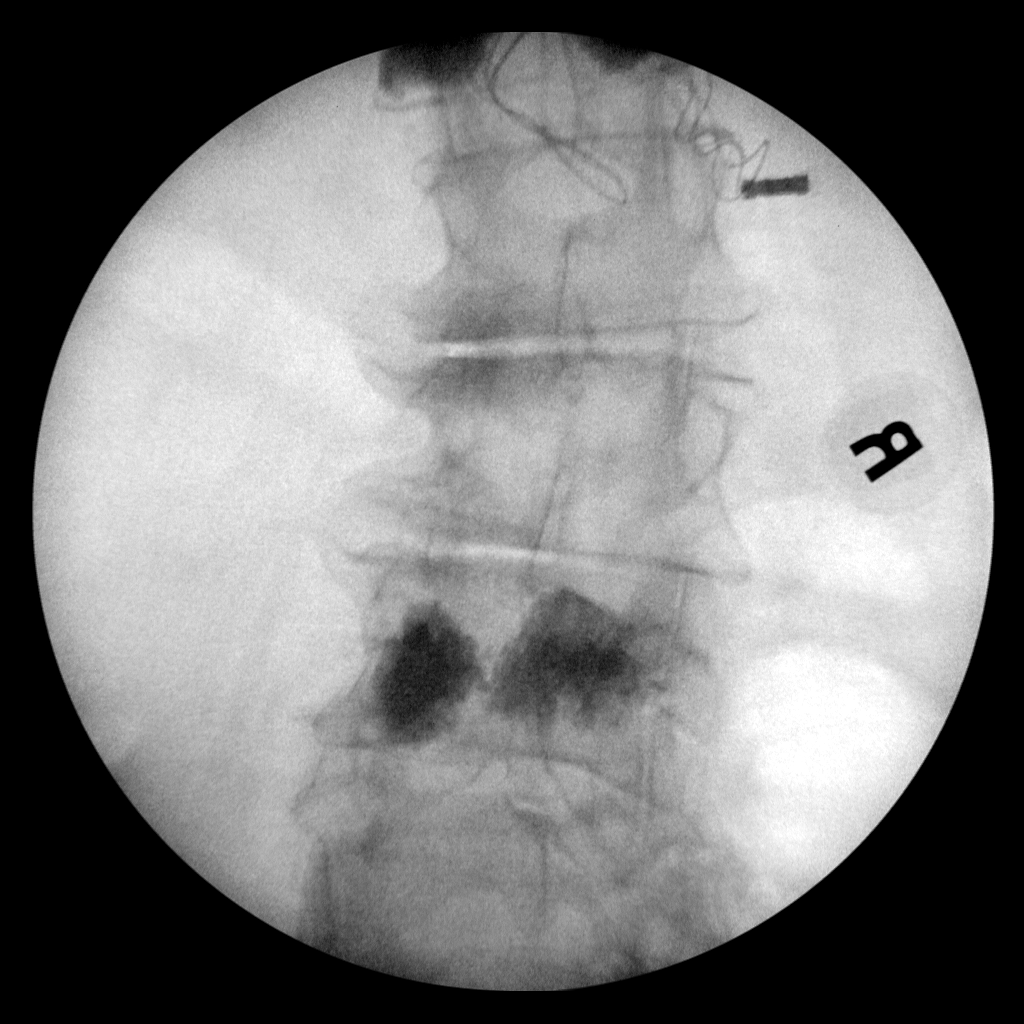

[4 of 4 positions shown; findings below may reference images not displayed]

FINDINGS: Two fluoroscopic spot views obtained of the lumbar spine in the
operating room. Kyphoplasty is visualized within T12, L1, and L4,
detailed level assessment limited due to the limited provided views.
T12 kyphoplasty was present on prior MRI. Total fluoroscopy time 4
minutes 40 seconds. Total dose 216.67 mGy.
IMPRESSION: Fluoroscopic spot views during lumbar kyphoplasty.

## 2021-02-05 IMAGING — RF DG LUMBAR SPINE 2-3V
1 series · 2 of 2 positions shown · non-contrast
Comparison: Lumbar MRI [DATE]

CLINICAL DATA: L1 and L4 kyphoplasty.

EXAM:
LUMBAR SPINE - 2-3 VIEW; DG C-ARM 1-60 MIN

[Series 1: run · 2 of 2 slices shown]
[im 1/2]
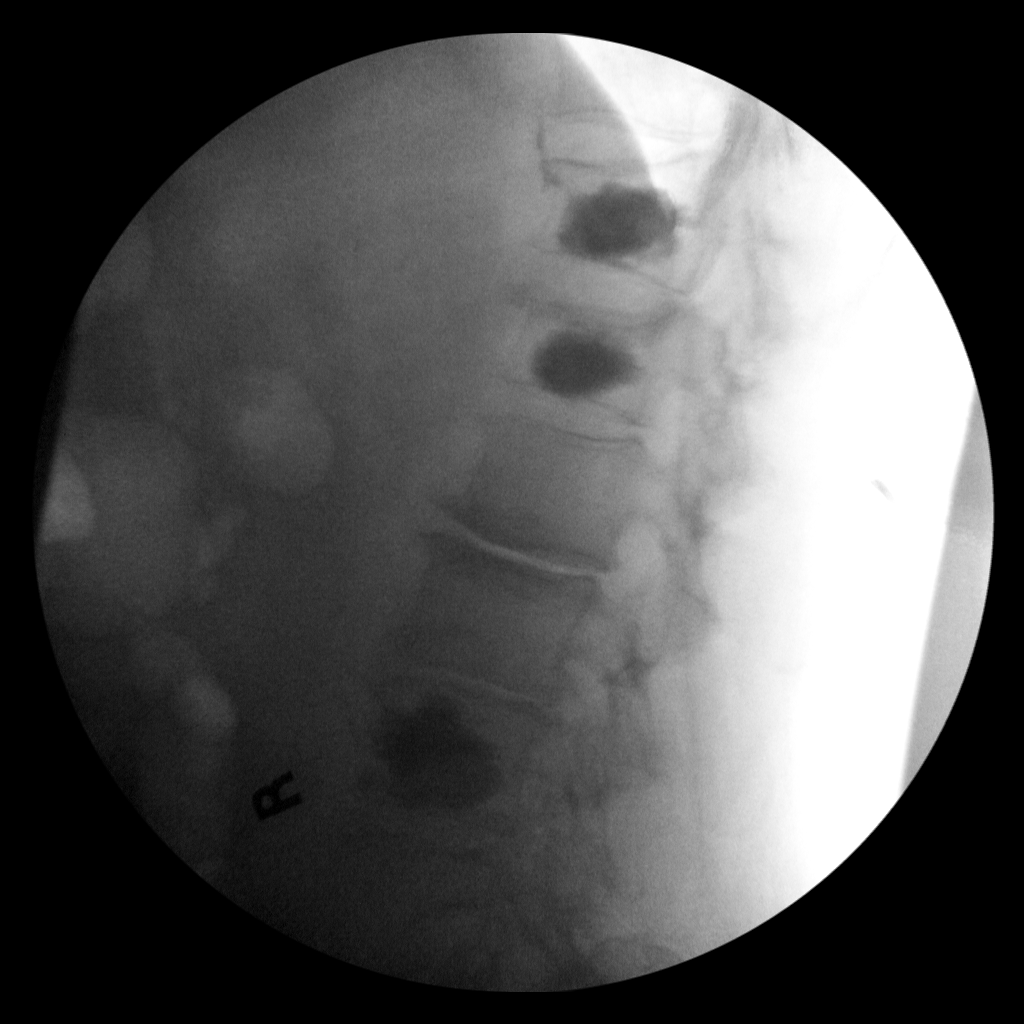
[im 2/2]
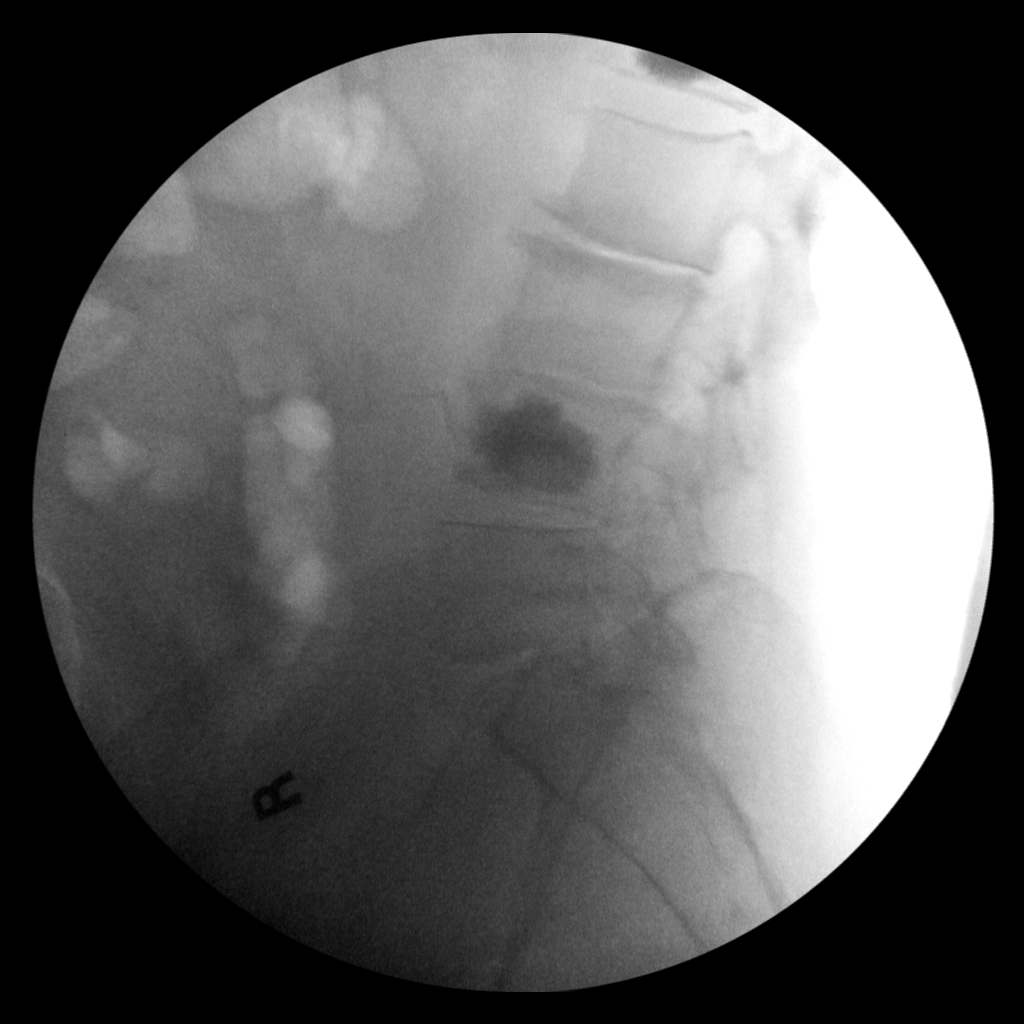

[2 of 2 positions shown; findings below may reference images not displayed]

FINDINGS: Two fluoroscopic spot views obtained of the lumbar spine in the
operating room. Kyphoplasty is visualized within T12, L1, and L4,
detailed level assessment limited due to the limited provided views.
T12 kyphoplasty was present on prior MRI. Total fluoroscopy time 4
minutes 40 seconds. Total dose 216.67 mGy.
IMPRESSION: Fluoroscopic spot views during lumbar kyphoplasty.

## 2021-02-05 SURGERY — KYPHOPLASTY
Anesthesia: General

## 2021-02-05 MED ORDER — SUGAMMADEX SODIUM 200 MG/2ML IV SOLN
INTRAVENOUS | Status: DC | PRN
  Administered 2021-02-05: 200 mg via INTRAVENOUS

## 2021-02-05 MED ORDER — METHOCARBAMOL 500 MG PO TABS
500.0000 mg | ORAL_TABLET | Freq: Four times a day (QID) | ORAL | 0 refills | Status: DC | PRN
  Filled 2021-02-05: qty 60, 15d supply, fill #0

## 2021-02-05 MED ORDER — LACTATED RINGERS IV SOLN: INTRAVENOUS | Status: DC

## 2021-02-05 MED ORDER — FENTANYL CITRATE (PF) 100 MCG/2ML IJ SOLN
25.0000 ug | INTRAMUSCULAR | Status: DC | PRN
  Administered 2021-02-05: 25 ug via INTRAVENOUS
  Administered 2021-02-05: 50 ug via INTRAVENOUS
  Administered 2021-02-05: 25 ug via INTRAVENOUS

## 2021-02-05 MED ORDER — ROCURONIUM BROMIDE 10 MG/ML (PF) SYRINGE
PREFILLED_SYRINGE | INTRAVENOUS | Status: AC
  Filled 2021-02-05: qty 10

## 2021-02-05 MED ORDER — BACITRACIN ZINC 500 UNIT/GM EX OINT
TOPICAL_OINTMENT | CUTANEOUS | Status: AC
  Filled 2021-02-05: qty 28.35

## 2021-02-05 MED ORDER — DEXMEDETOMIDINE (PRECEDEX) IN NS 20 MCG/5ML (4 MCG/ML) IV SYRINGE
PREFILLED_SYRINGE | INTRAVENOUS | Status: DC | PRN
  Administered 2021-02-05: 8 ug via INTRAVENOUS

## 2021-02-05 MED ORDER — BACITRACIN ZINC 500 UNIT/GM EX OINT
TOPICAL_OINTMENT | CUTANEOUS | Status: DC | PRN
  Administered 2021-02-05: 1 via TOPICAL

## 2021-02-05 MED ORDER — LIDOCAINE 2% (20 MG/ML) 5 ML SYRINGE
INTRAMUSCULAR | Status: DC | PRN
  Administered 2021-02-05: 100 mg via INTRAVENOUS

## 2021-02-05 MED ORDER — CEFAZOLIN SODIUM-DEXTROSE 2-4 GM/100ML-% IV SOLN
2.0000 g | INTRAVENOUS | Status: AC
  Administered 2021-02-05: 2 g via INTRAVENOUS
  Filled 2021-02-05: qty 100

## 2021-02-05 MED ORDER — LIDOCAINE 2% (20 MG/ML) 5 ML SYRINGE
INTRAMUSCULAR | Status: AC
  Filled 2021-02-05: qty 5

## 2021-02-05 MED ORDER — PROPOFOL 10 MG/ML IV BOLUS
INTRAVENOUS | Status: DC | PRN
  Administered 2021-02-05: 150 mg via INTRAVENOUS

## 2021-02-05 MED ORDER — LABETALOL HCL 5 MG/ML IV SOLN
INTRAVENOUS | Status: DC | PRN
  Administered 2021-02-05: 5 mg via INTRAVENOUS

## 2021-02-05 MED ORDER — MIDAZOLAM HCL 5 MG/5ML IJ SOLN
INTRAMUSCULAR | Status: DC | PRN
  Administered 2021-02-05: 2 mg via INTRAVENOUS

## 2021-02-05 MED ORDER — ROCURONIUM BROMIDE 10 MG/ML (PF) SYRINGE
PREFILLED_SYRINGE | INTRAVENOUS | Status: DC | PRN
  Administered 2021-02-05 (×2): 50 mg via INTRAVENOUS

## 2021-02-05 MED ORDER — ORAL CARE MOUTH RINSE: 15.0000 mL | Freq: Once | OROMUCOSAL | Status: AC

## 2021-02-05 MED ORDER — IOPAMIDOL (ISOVUE-300) INJECTION 61%
INTRAVENOUS | Status: DC | PRN
  Administered 2021-02-05: 100 mL

## 2021-02-05 MED ORDER — MIDAZOLAM HCL 2 MG/2ML IJ SOLN
INTRAMUSCULAR | Status: AC
  Filled 2021-02-05: qty 2

## 2021-02-05 MED ORDER — FENTANYL CITRATE (PF) 250 MCG/5ML IJ SOLN
INTRAMUSCULAR | Status: DC | PRN
  Administered 2021-02-05 (×3): 50 ug via INTRAVENOUS

## 2021-02-05 MED ORDER — CHLORHEXIDINE GLUCONATE 0.12 % MT SOLN
15.0000 mL | Freq: Once | OROMUCOSAL | Status: AC
  Administered 2021-02-05: 15 mL via OROMUCOSAL

## 2021-02-05 MED ORDER — ACETAMINOPHEN 500 MG PO TABS: 1000.0000 mg | ORAL_TABLET | Freq: Once | ORAL | Status: DC | PRN

## 2021-02-05 MED ORDER — HYDROCODONE-ACETAMINOPHEN 5-325 MG PO TABS
1.0000 | ORAL_TABLET | Freq: Four times a day (QID) | ORAL | 0 refills | Status: DC | PRN
  Filled 2021-02-05: qty 6, 2d supply, fill #0

## 2021-02-05 MED ORDER — CHLORHEXIDINE GLUCONATE 0.12 % MT SOLN
OROMUCOSAL | Status: AC
  Filled 2021-02-05: qty 15

## 2021-02-05 MED ORDER — ONDANSETRON HCL 4 MG/2ML IJ SOLN
INTRAMUSCULAR | Status: AC
  Filled 2021-02-05: qty 2

## 2021-02-05 MED ORDER — DEXAMETHASONE SODIUM PHOSPHATE 10 MG/ML IJ SOLN
INTRAMUSCULAR | Status: DC | PRN
  Administered 2021-02-05: 10 mg via INTRAVENOUS

## 2021-02-05 MED ORDER — BUPIVACAINE-EPINEPHRINE (PF) 0.25% -1:200000 IJ SOLN
INTRAMUSCULAR | Status: AC
  Filled 2021-02-05: qty 30

## 2021-02-05 MED ORDER — ACETAMINOPHEN 10 MG/ML IV SOLN: 1000.0000 mg | Freq: Once | INTRAVENOUS | Status: DC | PRN

## 2021-02-05 MED ORDER — OXYCODONE HCL 5 MG PO TABS
ORAL_TABLET | ORAL | Status: AC
  Filled 2021-02-05: qty 1

## 2021-02-05 MED ORDER — OXYCODONE HCL 5 MG/5ML PO SOLN
5.0000 mg | Freq: Once | ORAL | Status: AC | PRN
Start: 2021-02-05 — End: 2021-02-05

## 2021-02-05 MED ORDER — 0.9 % SODIUM CHLORIDE (POUR BTL) OPTIME
TOPICAL | Status: DC | PRN
  Administered 2021-02-05: 1000 mL

## 2021-02-05 MED ORDER — FENTANYL CITRATE (PF) 100 MCG/2ML IJ SOLN
INTRAMUSCULAR | Status: AC
  Filled 2021-02-05: qty 2

## 2021-02-05 MED ORDER — ACETAMINOPHEN 160 MG/5ML PO SOLN: 1000.0000 mg | Freq: Once | ORAL | Status: DC | PRN

## 2021-02-05 MED ORDER — PROPOFOL 10 MG/ML IV BOLUS
INTRAVENOUS | Status: AC
  Filled 2021-02-05: qty 20

## 2021-02-05 MED ORDER — ONDANSETRON HCL 4 MG/2ML IJ SOLN
INTRAMUSCULAR | Status: DC | PRN
  Administered 2021-02-05: 4 mg via INTRAVENOUS

## 2021-02-05 MED ORDER — POVIDONE-IODINE 7.5 % EX SOLN
Freq: Once | CUTANEOUS | Status: DC
  Filled 2021-02-05: qty 118

## 2021-02-05 MED ORDER — LABETALOL HCL 5 MG/ML IV SOLN
INTRAVENOUS | Status: AC
  Filled 2021-02-05: qty 4

## 2021-02-05 MED ORDER — OXYCODONE HCL 5 MG PO TABS
5.0000 mg | ORAL_TABLET | Freq: Once | ORAL | Status: AC | PRN
  Administered 2021-02-05: 5 mg via ORAL

## 2021-02-05 MED ORDER — DEXAMETHASONE SODIUM PHOSPHATE 10 MG/ML IJ SOLN
INTRAMUSCULAR | Status: AC
  Filled 2021-02-05: qty 1

## 2021-02-05 MED ORDER — FENTANYL CITRATE (PF) 250 MCG/5ML IJ SOLN
INTRAMUSCULAR | Status: AC
  Filled 2021-02-05: qty 5

## 2021-02-05 SURGICAL SUPPLY — 48 items
BLADE SURG 15 STRL LF DISP TIS (BLADE) ×1 IMPLANT
BLADE SURG 15 STRL SS (BLADE) ×3
CEMENT KYPHON CX01A KIT/MIXER (Cement) ×2 IMPLANT
COVER MAYO STAND STRL (DRAPES) ×3 IMPLANT
COVER SURGICAL LIGHT HANDLE (MISCELLANEOUS) ×3 IMPLANT
COVER WAND RF STERILE (DRAPES) ×3 IMPLANT
CURETTE EXPRESS SZ2 7MM (INSTRUMENTS) IMPLANT
CURRETTE EXPRESS SZ2 7MM (INSTRUMENTS)
DRAPE C-ARM 42X72 X-RAY (DRAPES) ×3 IMPLANT
DRAPE HALF SHEET 40X57 (DRAPES) IMPLANT
DRAPE INCISE IOBAN 66X45 STRL (DRAPES) ×3 IMPLANT
DRAPE LAPAROTOMY T 102X78X121 (DRAPES) ×3 IMPLANT
DRAPE SURG 17X23 STRL (DRAPES) ×12 IMPLANT
DRAPE WARM FLUID 44X44 (DRAPES) ×3 IMPLANT
DURAPREP 26ML APPLICATOR (WOUND CARE) ×3 IMPLANT
GAUZE 4X4 16PLY RFD (DISPOSABLE) ×3 IMPLANT
GAUZE SPONGE 2X2 8PLY STRL LF (GAUZE/BANDAGES/DRESSINGS) ×1 IMPLANT
GAUZE SPONGE 4X4 12PLY STRL LF (GAUZE/BANDAGES/DRESSINGS) ×2 IMPLANT
GLOVE BIO SURGEON STRL SZ7 (GLOVE) ×3 IMPLANT
GLOVE BIO SURGEON STRL SZ8 (GLOVE) ×3 IMPLANT
GLOVE SRG 8 PF TXTR STRL LF DI (GLOVE) ×1 IMPLANT
GLOVE SURG UNDER POLY LF SZ7 (GLOVE) ×3 IMPLANT
GLOVE SURG UNDER POLY LF SZ8 (GLOVE) ×3
GOWN STRL REUS W/ TWL LRG LVL3 (GOWN DISPOSABLE) ×2 IMPLANT
GOWN STRL REUS W/ TWL XL LVL3 (GOWN DISPOSABLE) ×1 IMPLANT
GOWN STRL REUS W/TWL LRG LVL3 (GOWN DISPOSABLE) ×6
GOWN STRL REUS W/TWL XL LVL3 (GOWN DISPOSABLE) ×3
KIT BASIN OR (CUSTOM PROCEDURE TRAY) ×3 IMPLANT
KIT TURNOVER KIT B (KITS) ×3 IMPLANT
NDL HYPO 25X1 1.5 SAFETY (NEEDLE) ×1 IMPLANT
NDL SPNL 18GX3.5 QUINCKE PK (NEEDLE) ×2 IMPLANT
NEEDLE 22X1 1/2 (OR ONLY) (NEEDLE) IMPLANT
NEEDLE HYPO 25X1 1.5 SAFETY (NEEDLE) ×3 IMPLANT
NEEDLE SPNL 18GX3.5 QUINCKE PK (NEEDLE) ×6 IMPLANT
NS IRRIG 1000ML POUR BTL (IV SOLUTION) ×3 IMPLANT
PACK UNIVERSAL I (CUSTOM PROCEDURE TRAY) ×3 IMPLANT
PAD ARMBOARD 7.5X6 YLW CONV (MISCELLANEOUS) ×6 IMPLANT
POSITIONER HEAD PRONE TRACH (MISCELLANEOUS) ×3 IMPLANT
SPONGE GAUZE 2X2 STER 10/PKG (GAUZE/BANDAGES/DRESSINGS) ×4
SUT MNCRL AB 4-0 PS2 18 (SUTURE) ×3 IMPLANT
SYR BULB IRRIG 60ML STRL (SYRINGE) ×3 IMPLANT
SYR CONTROL 10ML LL (SYRINGE) ×3 IMPLANT
TAPE CLOTH SURG 4X10 WHT LF (GAUZE/BANDAGES/DRESSINGS) ×2 IMPLANT
TOWEL GREEN STERILE (TOWEL DISPOSABLE) ×3 IMPLANT
TOWEL GREEN STERILE FF (TOWEL DISPOSABLE) ×3 IMPLANT
TRAY KYPHOPAK 15/3 ADV 1ST (SET/KITS/TRAYS/PACK) ×2 IMPLANT
TRAY KYPHOPAK 15/3 ONESTEP 1ST (MISCELLANEOUS) IMPLANT
TRAY KYPHOPAK 20/3 ONESTEP 1ST (MISCELLANEOUS) IMPLANT

## 2021-02-05 NOTE — Op Note (Signed)
PATIENT NAME: Rhonda Espinoza RECORD NO.:   [id number]    DATE OF BIRTH:[birth year]   DATE OF PROCEDURE: 02/05/2021                              OPERATIVE REPORT   PREOPERATIVE DIAGNOSIS:  L1 and L4 osteoporotic compression fractures  POSTOPERATIVE DIAGNOSIS:   L1 and L4 osteoporotic compression fractures  PROCEDURE:  L1 and L4 kyphoplasty procedures  SURGEON:  Phylliss Bob, MD.  ASSISTANT:  none  ANESTHESIA:  General endotracheal anesthesia.  COMPLICATIONS:  None.  DISPOSITION:  Stable.  ESTIMATED BLOOD LOSS:  Minimal.  INDICATIONS FOR SURGERY:  Briefly, Ms. Rhonda Espinoza is a very pleasant 60- year-old female, who did have a sudden onset of pain in her low back about 6 weeks ago.   Her pain has been rather severe.  The patient's imaging studies did clearly reveal active compression fractures as noted above. We did attempt a trial of nonoperative treatment, but the patient continued to feel rather debilitated as a result of her ongoing pain.  Given her ongoing pain and dysfunction, we did discuss proceeding with the procedure noted above.  I did fully discuss the procedure with the patient, and she did wish to proceed.  OPERATIVE DETAILS:  On 02/05/2021, the patient was brought to surgery and general endotracheal anesthesia was administered.  The patient was placed prone on a well-padded flat Jackson bed with gel rolls placed under the patient's chest and hips.  Antibiotics were given.  AP and lateral fluoroscopy was brought into the field.  The L1 and L4 pedicles were marked out in the usual fashion.  After a time-out procedure was performed, I did advance Jamshidis across the L1 and L4 pedicles on the right and left sides.  I then drilled through the Jamshidis.  I then inserted kyphoplasty balloons and I was able to inflate the balloons with approximately 4 cc of contrast in each vertebral body.  Partial restoration of the superior endplates was noted.  At this point,  after the cement was mixed, a total of approximately 5 cc of cement was injected in each vertebral body, half on the right, and half on the left.  Excellent interdigitation of cement was identified.  There was no abnormal extravasation of cement identified.  The cement was then allowed to harden, after which point the Jamshidis were removed.  The wound  was then irrigated and closed using 4-0 Monocryl.  Bacitracin and a sterile dressing was applied.  The patient was then awoken from general endotracheal anesthesia and transferred to Recovery in stable condition.   Phylliss Bob, MD

## 2021-02-05 NOTE — Anesthesia Preprocedure Evaluation (Signed)
Anesthesia Evaluation  Patient identified by MRN, date of birth, ID band Patient awake    Reviewed: Allergy & Precautions, NPO status , Patient's Chart, lab work & pertinent test results  History of Anesthesia Complications Negative for: history of anesthetic complications  Airway Mallampati: III  TM Distance: >3 FB Neck ROM: Full    Dental  (+) Dental Advisory Given   Pulmonary neg shortness of breath, asthma , neg sleep apnea, neg COPD, neg recent URI,  Covid-19 Nucleic Acid Test Results Lab Results      Component                Value               Date                      [place]              NEGATIVE            02/04/2021                [place]              NEGATIVE            09/25/2020              breath sounds clear to auscultation       Cardiovascular hypertension, Pt. on medications (-) angina(-) Past MI and (-) CHF  Rhythm:Regular     Neuro/Psych PSYCHIATRIC DISORDERS Anxiety  Neuromuscular disease    GI/Hepatic negative GI ROS, H/o Alcohol-induced pancreatitis  Lab Results      Component                Value               Date                      ALT                      20                  02/04/2021                AST                      28                  02/04/2021                ALKPHOS                  121                 02/04/2021                BILITOT                  0.7                 02/04/2021              Endo/Other    Renal/GU Renal diseaseLab Results      Component                Value               Date  CREATININE               0.83                02/04/2021                Musculoskeletal  (+) Arthritis ,   Abdominal   Peds  Hematology Lab Results      Component                Value               Date                      WBC                      7.0                 02/04/2021                HGB                      12.6                 02/04/2021                HCT                      37.9                02/04/2021                MCV                      100.8 (H)           02/04/2021                PLT                      420 (H)             02/04/2021              Anesthesia Other Findings   Reproductive/Obstetrics                             Anesthesia Physical Anesthesia Plan  ASA: II  Anesthesia Plan: General   Post-op Pain Management:    Induction: Intravenous  PONV Risk Score and Plan: 3 and Ondansetron and Dexamethasone  Airway Management Planned: Oral ETT  Additional Equipment: None  Intra-op Plan:   Post-operative Plan: Extubation in OR  Informed Consent: I have reviewed the patients History and Physical, chart, labs and discussed the procedure including the risks, benefits and alternatives for the proposed anesthesia with the patient or authorized representative who has indicated his/her understanding and acceptance.     Dental advisory given  Plan Discussed with: CRNA and Surgeon  Anesthesia Plan Comments:         Anesthesia Quick Evaluation

## 2021-02-05 NOTE — Transfer of Care (Signed)
Immediate Anesthesia Transfer of Care Note  Patient: Rhonda Espinoza  Procedure(s) Performed: LUMBAR ONE  AND LUMBAR FOUR KYPHOPLASTY (N/A )  Patient Location: PACU  Anesthesia Type:General  Level of Consciousness: awake, alert  and patient cooperative  Airway & Oxygen Therapy: Patient Spontanous Breathing  Post-op Assessment: Report given to RN  Post vital signs: Reviewed and stable  Last Vitals:  Vitals Value Taken Time  BP 131/74 02/05/21 1410  Temp    Pulse 84 02/05/21 1411  Resp 22 02/05/21 1411  SpO2 95 % 02/05/21 1411  Vitals shown include unvalidated device data.  Last Pain:  Vitals:   02/05/21 1034  TempSrc:   PainSc: 9          Complications: No complications documented.

## 2021-02-05 NOTE — Anesthesia Procedure Notes (Signed)
Procedure Name: Intubation Date/Time: 02/05/2021 12:47 PM Performed by: Georgia Duff, CRNA Pre-anesthesia Checklist: Patient identified, Emergency Drugs available, Suction available and Patient being monitored Patient Re-evaluated:Patient Re-evaluated prior to induction Oxygen Delivery Method: Circle System Utilized Preoxygenation: Pre-oxygenation with 100% oxygen Induction Type: IV induction Ventilation: Mask ventilation without difficulty Laryngoscope Size: Miller and 3 Tube type: Oral Tube size: 7.0 mm Number of attempts: 1 Airway Equipment and Method: Stylet and Oral airway Placement Confirmation: ETT inserted through vocal cords under direct vision,  positive ETCO2 and breath sounds checked- equal and bilateral Secured at: 21 cm Tube secured with: Tape Dental Injury: Teeth and Oropharynx as per pre-operative assessment

## 2021-02-05 NOTE — H&P (Signed)
PREOPERATIVE H&P  Chief Complaint: LOW BACK PAIN  HPI: Rhonda Espinoza is a 60 y.o. female who presents with ongoing pain in the low back. She did have an acute onset of pain about 6 weeks ago.  Advanced imaging was performed, notable for active compression fractures at L1 and L4. Patient has felt extremely limited as result of her pain.  We therefore did discuss proceeding with kyphoplasty procedures.   Patient has failed multiple forms of conservative care and continues to have pain (see office notes for additional details regarding the patient's full course of treatment)  Past Medical History:  Diagnosis Date  . Abnormal LFTs   . ADD (attention deficit disorder)   . Alcohol-induced pancreatitis   . Anxiety   . Arthritis   . Asthma    allergy induced per patient  . Disc disorder   . Hyperlipidemia   . Hypertension   . Pneumonia    Past Surgical History:  Procedure Laterality Date  . CESAREAN SECTION    . CHOLECYSTECTOMY    . FOOT SURGERY Left   . FRACTURE SURGERY Right 07/2020   R wrist   . HERNIA REPAIR    . KNEE ARTHROSCOPY    . KYPHOPLASTY N/A 10/13/2018   Procedure: THORACIC 12 KYPHOPLASTY;  Surgeon: Phylliss Bob, MD;  Location: [facility];  Service: Orthopedics;  Laterality: N/A;   Social History   Socioeconomic History  . Marital status: Married    Spouse name: Not on file  . Number of children: Not on file  . Years of education: Not on file  . Highest education level: Not on file  Occupational History  . Not on file  Tobacco Use  . Smoking status: Never Smoker  . Smokeless tobacco: Never Used  Vaping Use  . Vaping Use: Never used  Substance and Sexual Activity  . Alcohol use: Not Currently  . Drug use: Never  . Sexual activity: Not on file  Other Topics Concern  . Not on file  Social History Narrative  . Not on file   Social Determinants of Health   Financial Resource Strain: Not on file  Food Insecurity: Not on file  Transportation Needs:  Not on file  Physical Activity: Not on file  Stress: Not on file  Social Connections: Not on file   No family history on file. Allergies  Allergen Reactions  . Latex Other (See Comments)    "more like bandages" CAUSES BLISTERS   Prior to Admission medications   Medication Sig Start Date End Date Taking? Authorizing Provider  acetaminophen (TYLENOL) 500 MG tablet Take 1,000 mg by mouth every 8 (eight) hours as needed for moderate pain.   Yes [provider]  ALPRAZolam [name] 0.5 MG tablet Take 0.5 mg by mouth 3 (three) times daily as needed for anxiety. 07/09/16  Yes [provider]  atorvastatin (LIPITOR) 40 MG tablet Take 40 mg by mouth daily.  06/12/16  Yes [provider]  desvenlafaxine (PRISTIQ) 50 MG 24 hr tablet Take 50 mg by mouth daily. 05/04/20  Yes [provider]  fenofibrate 160 MG tablet Take 160 mg by mouth daily.  06/10/16  Yes [provider]  omeprazole (PRILOSEC) 40 MG capsule Take 40 mg by mouth daily.   Yes [provider]  ondansetron (ZOFRAN-ODT) 8 MG disintegrating tablet DISSOLVE 1 TABLET BY MOUTH EVERY 4 HOURS AS NEEDED FOR NAUSEA 10/23/20 10/23/21 Yes Delo, Nathaneil Canary, MD  promethazine (PHENERGAN) 25 MG tablet Take 0.5-1  tablets (12.5-25 mg total) by mouth every 8 (eight) hours as needed for nausea. 09/25/20  Yes Davonna Belling, MD  tiZANidine (ZANAFLEX) 4 MG tablet Take 4 mg by mouth at bedtime as needed for muscle spasms.  04/26/20  Yes [provider]  valsartan-hydrochlorothiazide (DIOVAN-HCT) 160-25 MG tablet Take 1 tablet by mouth daily.   Yes [provider]  Vitamin D, Ergocalciferol, (DRISDOL) 50000 units CAPS capsule Take 50,000 Units by mouth every 7 (seven) days.  03/10/16  Yes [provider]  zolpidem (AMBIEN CR) 12.5 MG CR tablet Take 12.5 mg by mouth at bedtime as needed for sleep.  07/16/16  Yes [provider]  HYDROcodone-acetaminophen (NORCO/VICODIN) 5-325 MG  tablet TAKE 1-2 TABLETS BY MOUTH EVERY 6 (SIX) HOURS AS NEEDED. Patient not taking: Reported on 01/29/2021 10/23/20 04/21/21  Veryl Speak, MD  oxyCODONE-acetaminophen (PERCOCET) 5-325 MG tablet Take 1-2 tablets by mouth every 8 (eight) hours as needed. Patient not taking: No sig reported 09/25/20   Davonna Belling, MD  pantoprazole (PROTONIX) 20 MG tablet Take 1 tablet (20 mg total) by mouth 2 (two) times daily. Patient not taking: Reported on 01/29/2021 09/03/20   Veryl Speak, MD  sucralfate (CARAFATE) 1 g tablet Take 1 tablet (1 g total) by mouth 3 (three) times daily with meals. Patient not taking: Reported on 01/29/2021 09/03/20   Veryl Speak, MD     All other systems have been reviewed and were otherwise negative with the exception of those mentioned in the HPI and as above.  Physical Exam: There were no vitals filed for this visit.  There is no height or weight on file to calculate BMI.  General: Alert, no acute distress Cardiovascular: No pedal edema Respiratory: No cyanosis, no use of accessory musculature Skin: No lesions in the area of chief complaint Neurologic: Sensation intact distally Psychiatric: Patient is competent for consent with normal mood and affect Lymphatic: No axillary or cervical lymphadenopathy   Assessment/Plan: SUBACUTE OSTEOPOROTIC COMPRESSION FRACTURES OF LUMBAR 1 AND LUMBAR 4 Plan for Procedure(s): LUMBAR 1 AND LUMBAR 4 KYPHOPLASTY   [name], MD 02/05/2021 8:29 AM

## 2021-02-05 NOTE — Anesthesia Postprocedure Evaluation (Signed)
Anesthesia Post Note  Patient: Rhonda Espinoza  Procedure(s) Performed: LUMBAR ONE  AND LUMBAR FOUR KYPHOPLASTY (N/A )     Patient location during evaluation: PACU Anesthesia Type: General Level of consciousness: awake and alert Pain management: pain level controlled Vital Signs Assessment: post-procedure vital signs reviewed and stable Respiratory status: spontaneous breathing, nonlabored ventilation, respiratory function stable and patient connected to nasal cannula oxygen Cardiovascular status: blood pressure returned to baseline and stable Postop Assessment: no apparent nausea or vomiting Anesthetic complications: no   No complications documented.  Last Vitals:  Vitals:   02/05/21 1525 02/05/21 1540  BP: (!) 159/75 (!) 145/67  Pulse: 83 85  Resp: 15 19  Temp:  (!) 36.2 C  SpO2: 95% 94%    Last Pain:  Vitals:   02/05/21 1540  TempSrc:   PainSc: [place]

## 2021-02-06 ENCOUNTER — Encounter (HOSPITAL_COMMUNITY): Payer: Self-pay | Admitting: Orthopedic Surgery

## 2021-05-18 ENCOUNTER — Emergency Department (HOSPITAL_COMMUNITY): Payer: Managed Care, Other (non HMO)

## 2021-05-18 ENCOUNTER — Observation Stay (HOSPITAL_COMMUNITY)
Admission: EM | Admit: 2021-05-18 | Discharge: 2021-05-19 | Disposition: A | Discharge status: Home / Self Care | Payer: Managed Care, Other (non HMO) | Attending: Family Medicine | Admitting: Family Medicine

## 2021-05-18 ENCOUNTER — Other Ambulatory Visit: Payer: Self-pay

## 2021-05-18 ENCOUNTER — Encounter (HOSPITAL_COMMUNITY): Payer: Self-pay | Admitting: Internal Medicine

## 2021-05-18 DIAGNOSIS — Y900 Blood alcohol level of less than 20 mg/100 ml: Secondary | ICD-10-CM | POA: Diagnosis not present

## 2021-05-18 DIAGNOSIS — F10239 Alcohol dependence with withdrawal, unspecified: Secondary | ICD-10-CM

## 2021-05-18 DIAGNOSIS — E876 Hypokalemia: Secondary | ICD-10-CM | POA: Diagnosis present

## 2021-05-18 DIAGNOSIS — F1010 Alcohol abuse, uncomplicated: Secondary | ICD-10-CM | POA: Diagnosis not present

## 2021-05-18 DIAGNOSIS — Z91040 Latex allergy status: Secondary | ICD-10-CM | POA: Insufficient documentation

## 2021-05-18 DIAGNOSIS — Z79899 Other long term (current) drug therapy: Secondary | ICD-10-CM | POA: Insufficient documentation

## 2021-05-18 DIAGNOSIS — R569 Unspecified convulsions: Secondary | ICD-10-CM | POA: Diagnosis present

## 2021-05-18 DIAGNOSIS — Z20822 Contact with and (suspected) exposure to covid-19: Secondary | ICD-10-CM | POA: Diagnosis not present

## 2021-05-18 DIAGNOSIS — J45909 Unspecified asthma, uncomplicated: Secondary | ICD-10-CM | POA: Diagnosis not present

## 2021-05-18 DIAGNOSIS — F10939 Alcohol use, unspecified with withdrawal, unspecified: Secondary | ICD-10-CM

## 2021-05-18 DIAGNOSIS — I10 Essential (primary) hypertension: Secondary | ICD-10-CM | POA: Diagnosis not present

## 2021-05-18 DIAGNOSIS — G9340 Encephalopathy, unspecified: Secondary | ICD-10-CM | POA: Diagnosis present

## 2021-05-18 DIAGNOSIS — G894 Chronic pain syndrome: Secondary | ICD-10-CM | POA: Diagnosis present

## 2021-05-18 LAB — CSF CELL COUNT WITH DIFFERENTIAL
RBC Count, CSF: 3 /mm3 — ABNORMAL HIGH
Tube #: 3
WBC, CSF: 2 /mm3 (ref 0–5)

## 2021-05-18 LAB — PROTEIN AND GLUCOSE, CSF
Glucose, CSF: 73 mg/dL — ABNORMAL HIGH (ref 40–70)
Total  Protein, CSF: 70 mg/dL — ABNORMAL HIGH (ref 15–45)

## 2021-05-18 LAB — I-STAT CHEM 8, ED
BUN: 10 mg/dL (ref 6–20)
Calcium, Ion: 1.1 mmol/L — ABNORMAL LOW (ref 1.15–1.40)
Chloride: 105 mmol/L (ref 98–111)
Creatinine, Ser: 0.6 mg/dL (ref 0.44–1.00)
Glucose, Bld: 160 mg/dL — ABNORMAL HIGH (ref 70–99)
HCT: 36 % (ref 36.0–46.0)
Hemoglobin: 12.2 g/dL (ref 12.0–15.0)
Potassium: 3 mmol/L — ABNORMAL LOW (ref 3.5–5.1)
Sodium: 141 mmol/L (ref 135–145)
TCO2: 20 mmol/L — ABNORMAL LOW (ref 22–32)

## 2021-05-18 LAB — CBC WITH DIFFERENTIAL/PLATELET
Abs Immature Granulocytes: 0.06 10*3/uL (ref 0.00–0.07)
Basophils Absolute: 0 10*3/uL (ref 0.0–0.1)
Basophils Relative: 0 %
Eosinophils Absolute: 0.1 10*3/uL (ref 0.0–0.5)
Eosinophils Relative: 1 %
HCT: 37.9 % (ref 36.0–46.0)
Hemoglobin: 12.4 g/dL (ref 12.0–15.0)
Immature Granulocytes: 1 %
Lymphocytes Relative: 32 %
Lymphs Abs: 3.7 10*3/uL (ref 0.7–4.0)
MCH: 34.4 pg — ABNORMAL HIGH (ref 26.0–34.0)
MCHC: 32.7 g/dL (ref 30.0–36.0)
MCV: 105.3 fL — ABNORMAL HIGH (ref 80.0–100.0)
Monocytes Absolute: 1 10*3/uL (ref 0.1–1.0)
Monocytes Relative: 9 %
Neutro Abs: 6.9 10*3/uL (ref 1.7–7.7)
Neutrophils Relative %: 57 %
Platelets: 482 10*3/uL — ABNORMAL HIGH (ref 150–400)
RBC: 3.6 MIL/uL — ABNORMAL LOW (ref 3.87–5.11)
RDW: 13.3 % (ref 11.5–15.5)
WBC: 11.9 10*3/uL — ABNORMAL HIGH (ref 4.0–10.5)
nRBC: 0 % (ref 0.0–0.2)

## 2021-05-18 LAB — COMPREHENSIVE METABOLIC PANEL
ALT: 24 U/L (ref 0–44)
AST: 34 U/L (ref 15–41)
Albumin: 3.9 g/dL (ref 3.5–5.0)
Alkaline Phosphatase: 95 U/L (ref 38–126)
Anion gap: 24 — ABNORMAL HIGH (ref 5–15)
BUN: 10 mg/dL (ref 6–20)
CO2: 16 mmol/L — ABNORMAL LOW (ref 22–32)
Calcium: 9.5 mg/dL (ref 8.9–10.3)
Chloride: 102 mmol/L (ref 98–111)
Creatinine, Ser: 0.82 mg/dL (ref 0.44–1.00)
GFR, Estimated: >60 mL/min (ref >60)
Glucose, Bld: 162 mg/dL — ABNORMAL HIGH (ref 70–99)
Potassium: 3 mmol/L — ABNORMAL LOW (ref 3.5–5.1)
Sodium: 142 mmol/L (ref 135–145)
Total Bilirubin: 0.7 mg/dL (ref 0.3–1.2)
Total Protein: 7 g/dL (ref 6.5–8.1)

## 2021-05-18 LAB — CBG MONITORING, ED: Glucose-Capillary: 157 mg/dL — ABNORMAL HIGH (ref 70–99)

## 2021-05-18 LAB — MAGNESIUM: Magnesium: 1.9 mg/dL (ref 1.7–2.4)

## 2021-05-18 LAB — BETA-HYDROXYBUTYRIC ACID: Beta-Hydroxybutyric Acid: 0.4 mmol/L — ABNORMAL HIGH (ref 0.05–0.27)

## 2021-05-18 LAB — LIPASE, BLOOD: Lipase: 26 U/L (ref 11–51)

## 2021-05-18 LAB — LACTIC ACID, PLASMA: Lactic Acid, Venous: 2 mmol/L (ref 0.5–1.9)

## 2021-05-18 IMAGING — CT CT HEAD W/O CM
4 series · 16 of 47 positions shown, 18 images · non-contrast
Comparison: None.

CLINICAL DATA: Seizure

EXAM:
CT HEAD WITHOUT CONTRAST
TECHNIQUE: Contiguous axial images were obtained from the base of the skull
through the vertex without intravenous contrast.

[Series 3: head wo · axial · 0.45mm/px · z∈[-224,-104]mm · 7 of 33 slices shown, 9 images]
[im 5/33  brain]
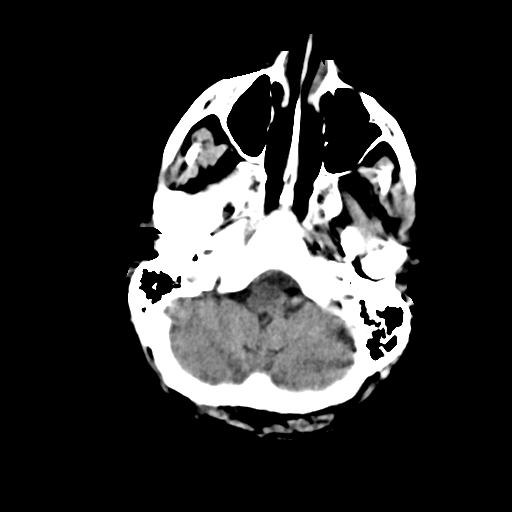
[im 5/33  bone]
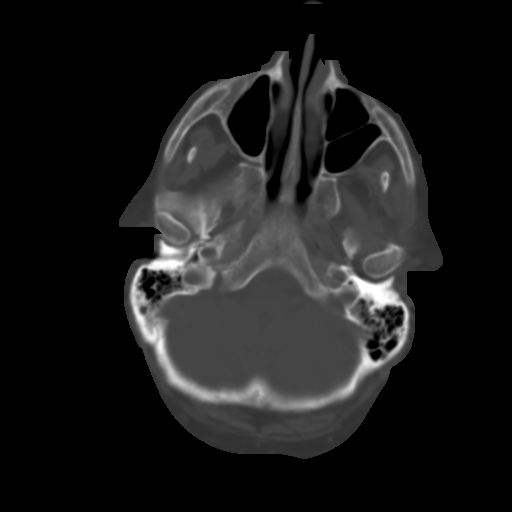
[im 9/33  brain]
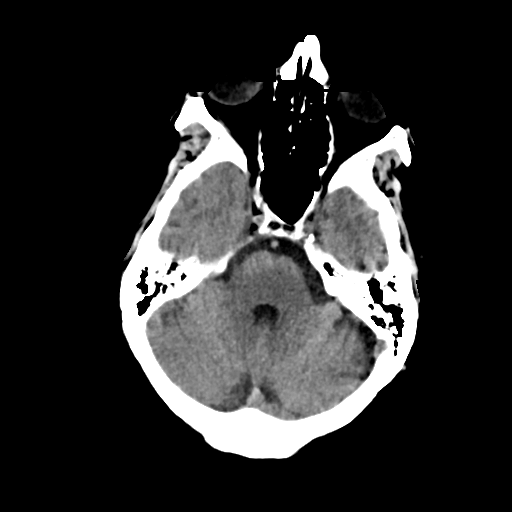
[im 13/33  brain]
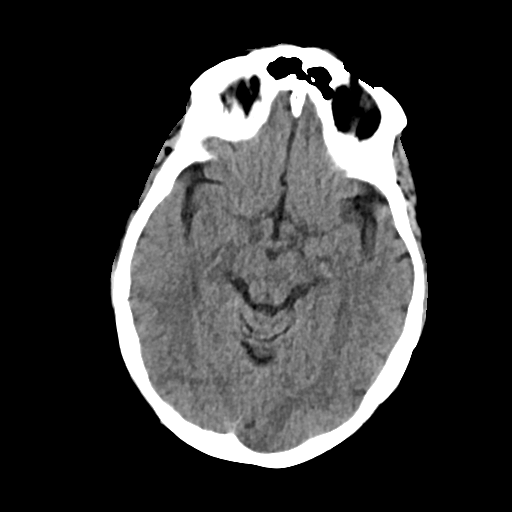
[im 17/33  brain]
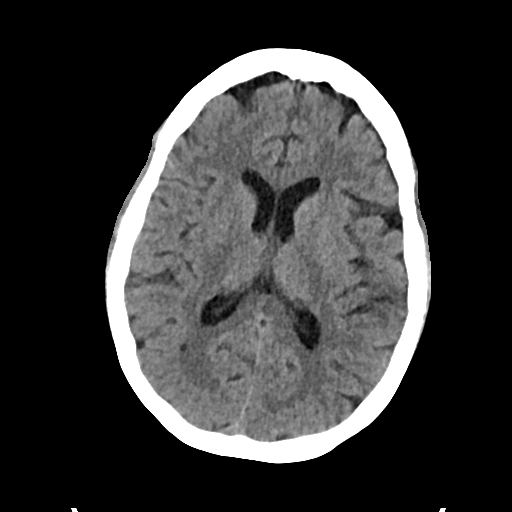
[im 21/33  brain]
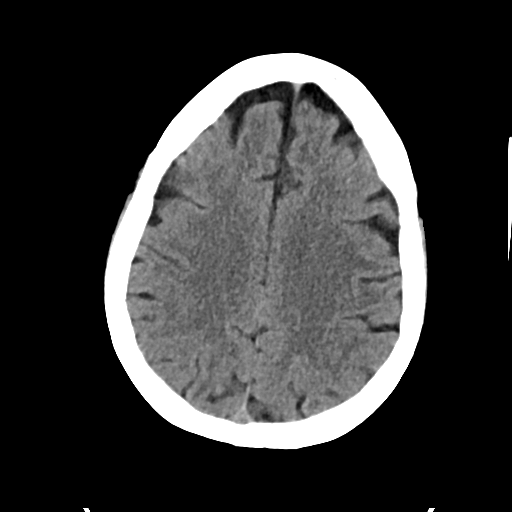
[im 21/33  bone]
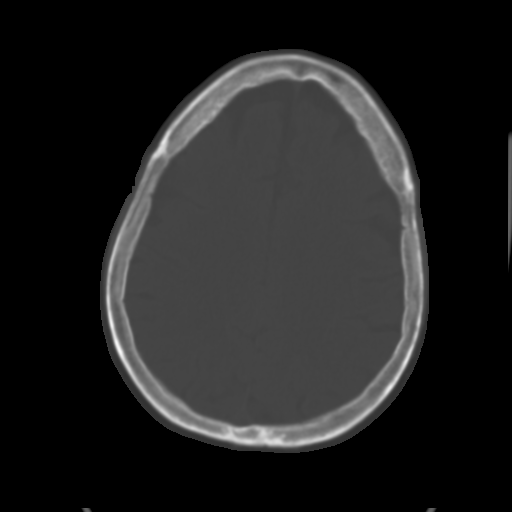
[im 25/33  brain]
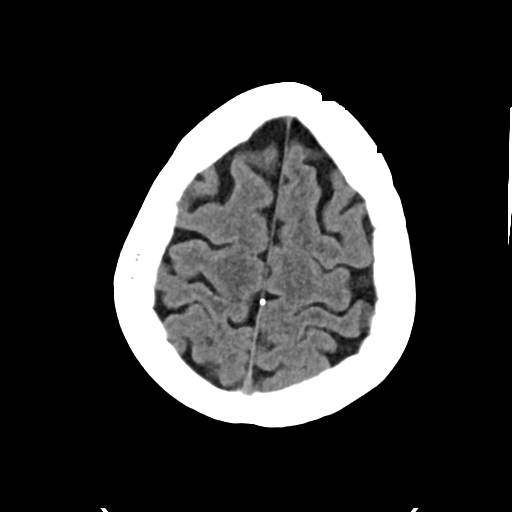
[im 29/33  brain]
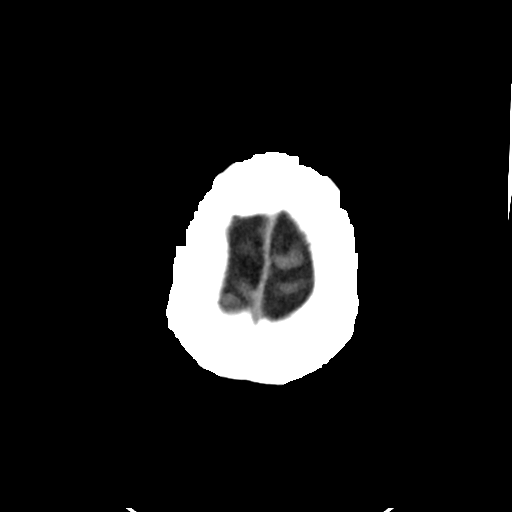

[Series 4: head bone · axial · 0.45mm/px · z∈[-228,-196]mm · 3 of 83 slices shown]
[im 9/83  bone]
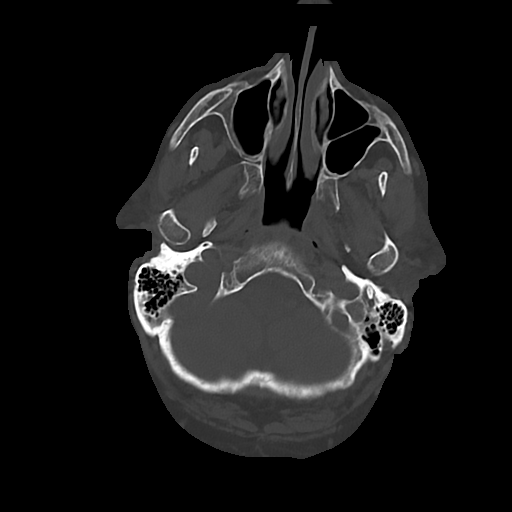
[im 17/83  bone]
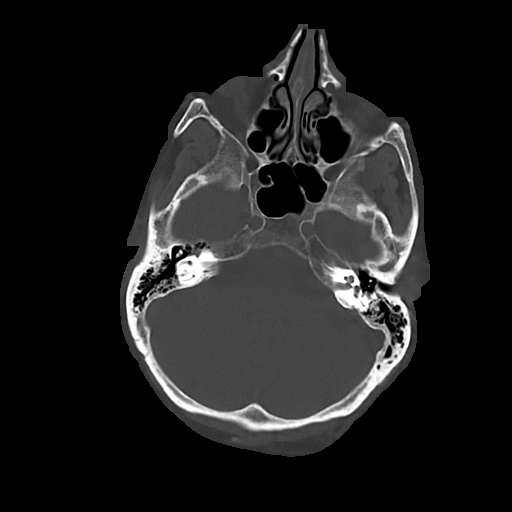
[im 25/83  bone]
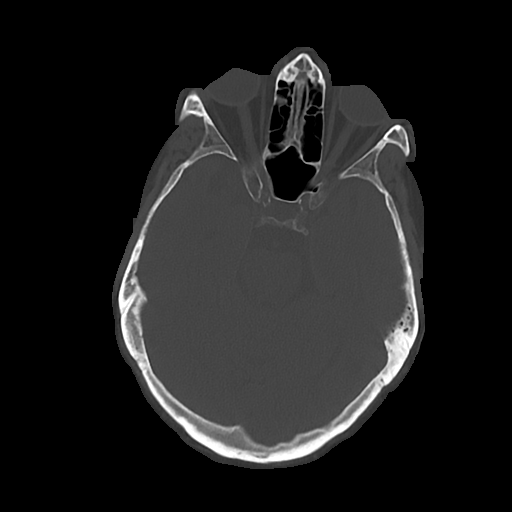

[Series 5: cor soft · coronal · 0.31mm/px · 3 of 73 slices shown]
[im 25/73  brain]
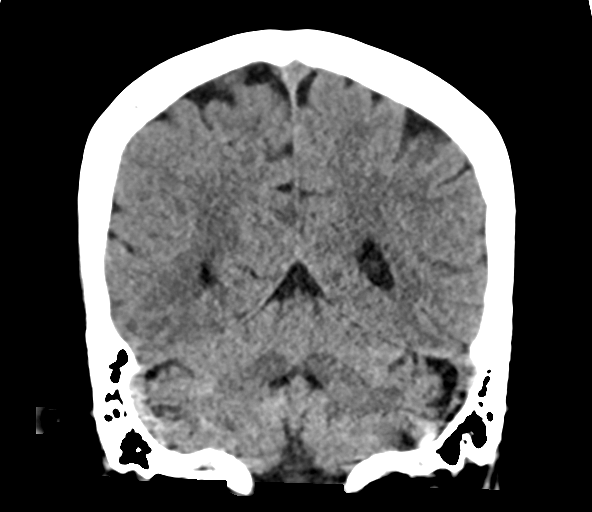
[im 33/73  brain]
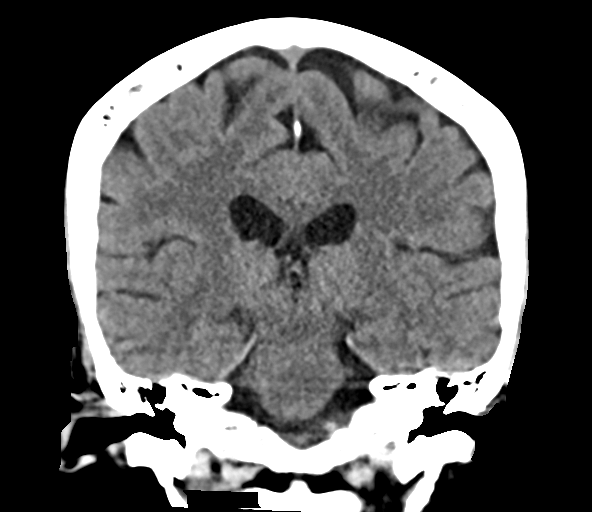
[im 41/73  brain]
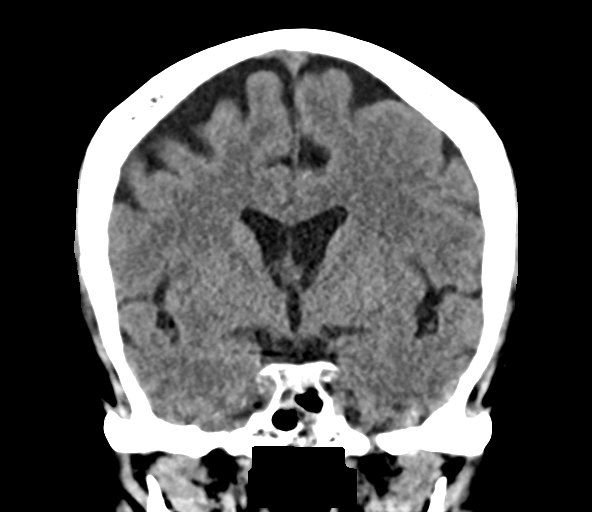

[Series 6: sag soft · sagittal · 0.31mm/px · 3 of 61 slices shown]
[im 21/61  brain]
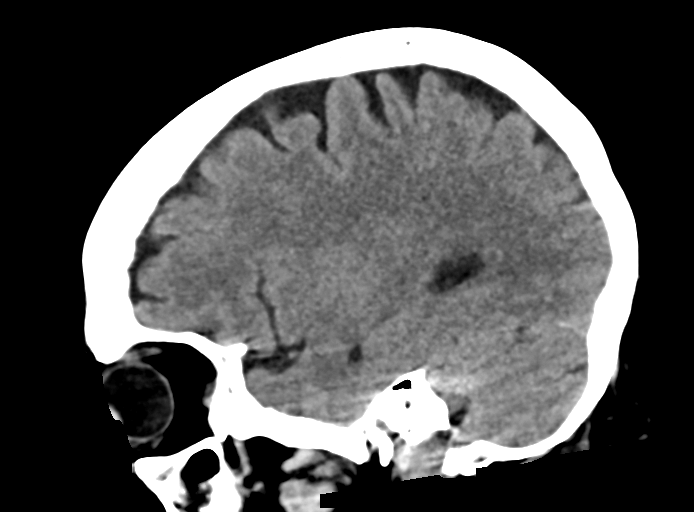
[im 31/61  brain]
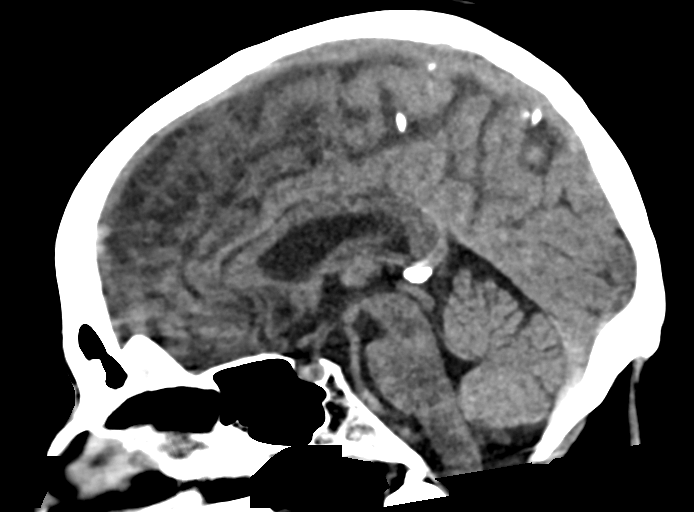
[im 41/61  brain]
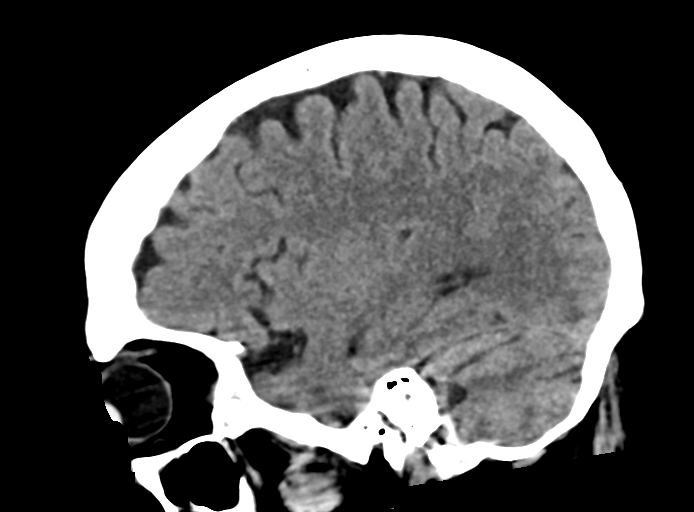

[16 of 47 positions shown; findings below may reference images not displayed]

FINDINGS: Brain: Normal anatomic configuration. No abnormal intra or
extra-axial mass lesion or fluid collection. No abnormal mass effect
or midline shift. No evidence of acute intracranial hemorrhage or
infarct. Ventricular size is normal. Cerebellum unremarkable.

Vascular: Unremarkable

Skull: Intact

Sinuses/Orbits: Paranasal sinuses are clear. Orbits are
unremarkable.

Other: Mastoid air cells and middle ear cavities are clear.
IMPRESSION: No acute intracranial abnormality.  Unremarkable examination.

## 2021-05-18 IMAGING — DX DG CHEST 1V PORT
1 series · 1 of 1 positions shown · non-contrast
Comparison: [DATE]

CLINICAL DATA: Seizure.

EXAM:
PORTABLE CHEST 1 VIEW

[chest ap]
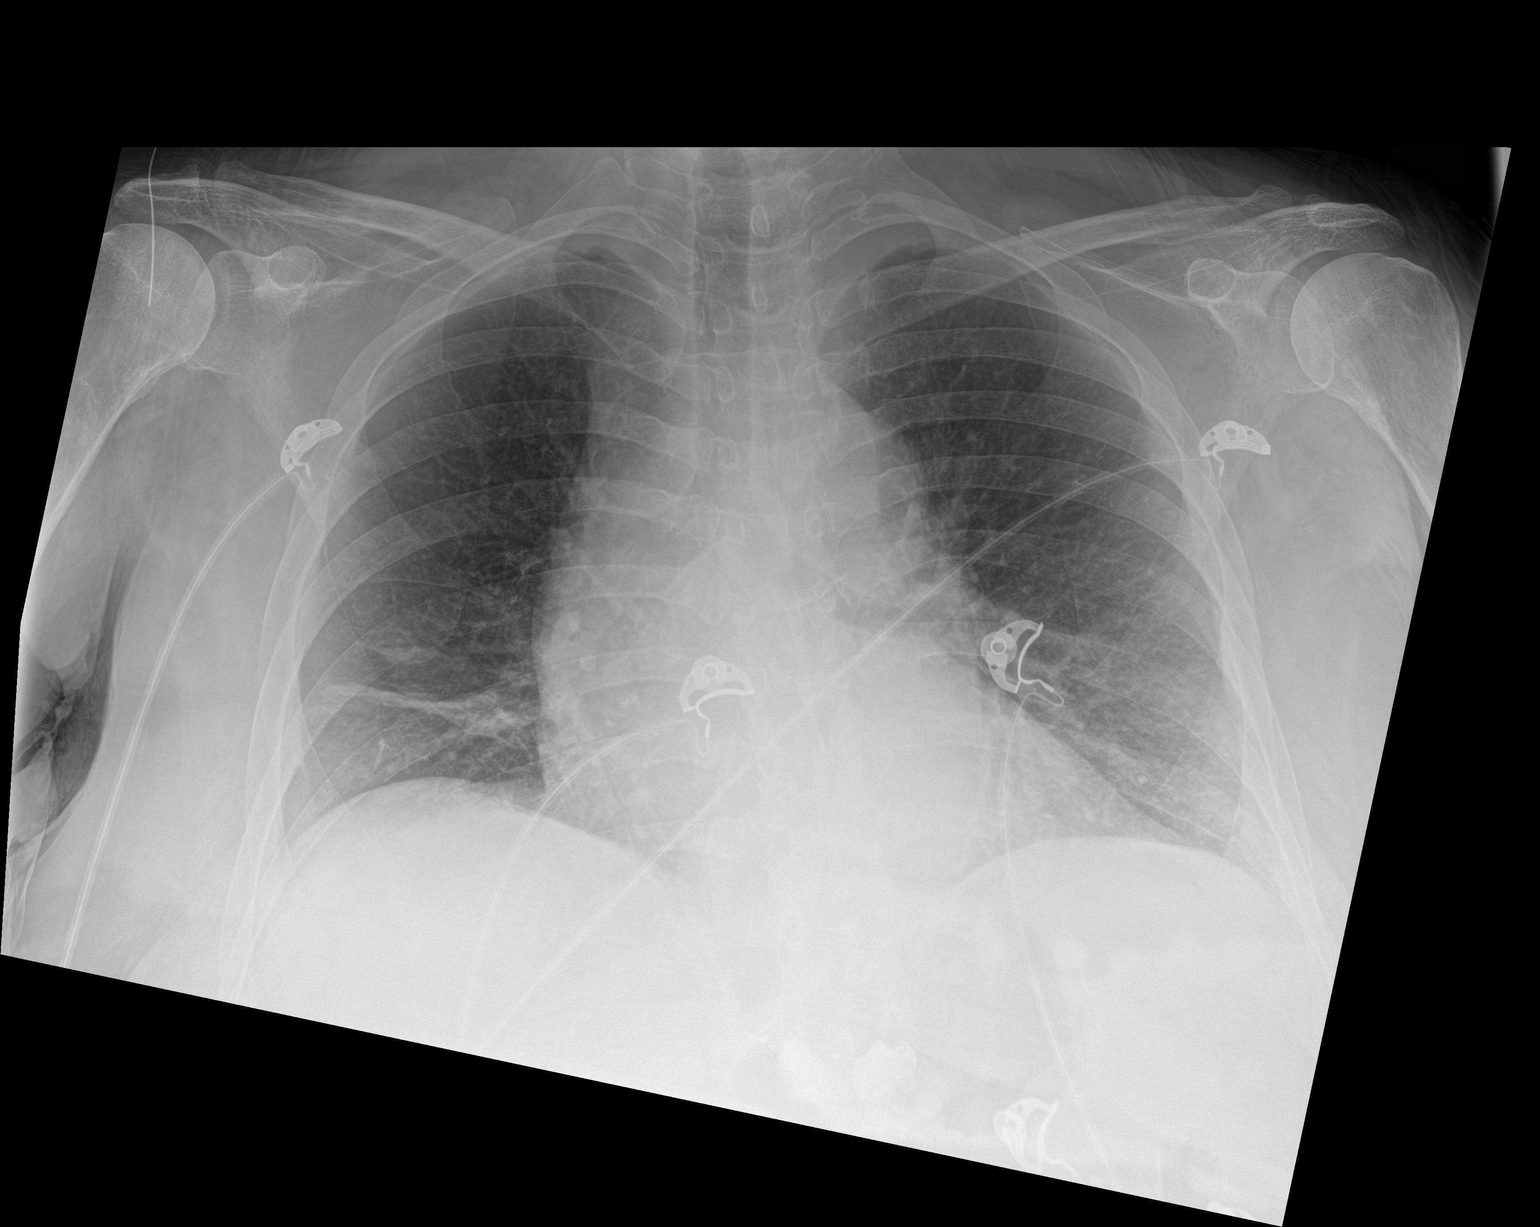

[1 of 1 positions shown; findings below may reference images not displayed]

FINDINGS: Low lung volumes. Heart size is normal accounting for low lung
volumes. 9 there is subsegmental atelectasis at the RIGHT lung base.
No pulmonary edema or consolidation.
IMPRESSION: RIGHT LOWER lobe atelectasis.

## 2021-05-18 MED ORDER — LABETALOL HCL 5 MG/ML IV SOLN
5.0000 mg | INTRAVENOUS | Status: DC | PRN
  Administered 2021-05-18: 10 mg via INTRAVENOUS
  Filled 2021-05-18: qty 4

## 2021-05-18 MED ORDER — THIAMINE HCL 100 MG PO TABS
100.0000 mg | ORAL_TABLET | Freq: Every day | ORAL | Status: DC
  Administered 2021-05-18 – 2021-05-19 (×2): 100 mg via ORAL
  Filled 2021-05-18 (×2): qty 1

## 2021-05-18 MED ORDER — ACETAMINOPHEN 325 MG PO TABS
650.0000 mg | ORAL_TABLET | Freq: Four times a day (QID) | ORAL | Status: DC | PRN
  Administered 2021-05-19: 650 mg via ORAL
  Filled 2021-05-18: qty 2

## 2021-05-18 MED ORDER — LORAZEPAM 2 MG/ML IJ SOLN
0.0000 mg | INTRAMUSCULAR | Status: DC
Start: 2021-05-18 — End: 2021-05-19
  Administered 2021-05-18: 2 mg via INTRAVENOUS
  Filled 2021-05-18: qty 1

## 2021-05-18 MED ORDER — ZOLPIDEM TARTRATE 5 MG PO TABS: 5.0000 mg | ORAL_TABLET | Freq: Every evening | ORAL | Status: DC | PRN

## 2021-05-18 MED ORDER — POTASSIUM CHLORIDE 10 MEQ/100ML IV SOLN
10.0000 meq | INTRAVENOUS | Status: AC
  Administered 2021-05-18 – 2021-05-19 (×4): 10 meq via INTRAVENOUS
  Filled 2021-05-18 (×4): qty 100

## 2021-05-18 MED ORDER — ACETAMINOPHEN 650 MG RE SUPP: 650.0000 mg | Freq: Four times a day (QID) | RECTAL | Status: DC | PRN

## 2021-05-18 MED ORDER — THIAMINE HCL 100 MG/ML IJ SOLN
100.0000 mg | Freq: Every day | INTRAMUSCULAR | Status: DC
  Filled 2021-05-18: qty 2

## 2021-05-18 MED ORDER — IRBESARTAN 75 MG PO TABS
75.0000 mg | ORAL_TABLET | Freq: Every day | ORAL | Status: DC
  Administered 2021-05-19: 75 mg via ORAL
  Filled 2021-05-18: qty 1

## 2021-05-18 MED ORDER — ATORVASTATIN CALCIUM 40 MG PO TABS
40.0000 mg | ORAL_TABLET | Freq: Every day | ORAL | Status: DC
  Administered 2021-05-19: 40 mg via ORAL
  Filled 2021-05-18 (×2): qty 1

## 2021-05-18 MED ORDER — FENOFIBRATE 160 MG PO TABS
160.0000 mg | ORAL_TABLET | Freq: Every day | ORAL | Status: DC
  Administered 2021-05-19: 160 mg via ORAL
  Filled 2021-05-18 (×2): qty 1

## 2021-05-18 MED ORDER — METHOCARBAMOL 500 MG PO TABS: 500.0000 mg | ORAL_TABLET | Freq: Four times a day (QID) | ORAL | Status: DC | PRN

## 2021-05-18 MED ORDER — ACETAMINOPHEN 500 MG PO TABS: 1000.0000 mg | ORAL_TABLET | Freq: Three times a day (TID) | ORAL | Status: DC | PRN

## 2021-05-18 MED ORDER — ASPIRIN EC 81 MG PO TBEC
81.0000 mg | DELAYED_RELEASE_TABLET | Freq: Every morning | ORAL | Status: DC
  Administered 2021-05-19: 81 mg via ORAL
  Filled 2021-05-18: qty 1

## 2021-05-18 MED ORDER — ENOXAPARIN SODIUM 40 MG/0.4ML IJ SOSY
40.0000 mg | PREFILLED_SYRINGE | INTRAMUSCULAR | Status: DC
  Administered 2021-05-19: 40 mg via SUBCUTANEOUS
  Filled 2021-05-18: qty 0.4

## 2021-05-18 MED ORDER — FOLIC ACID 1 MG PO TABS
1.0000 mg | ORAL_TABLET | Freq: Every day | ORAL | Status: DC
  Administered 2021-05-18 – 2021-05-19 (×2): 1 mg via ORAL
  Filled 2021-05-18 (×2): qty 1

## 2021-05-18 MED ORDER — LORAZEPAM 2 MG/ML IJ SOLN
1.0000 mg | INTRAMUSCULAR | Status: DC | PRN
Start: 2021-05-18 — End: 2021-05-19

## 2021-05-18 MED ORDER — TIZANIDINE HCL 4 MG PO TABS
4.0000 mg | ORAL_TABLET | Freq: Every evening | ORAL | Status: DC | PRN
  Administered 2021-05-18: 4 mg via ORAL
  Filled 2021-05-18: qty 1

## 2021-05-18 MED ORDER — LACTATED RINGERS IV SOLN: INTRAVENOUS | Status: DC

## 2021-05-18 MED ORDER — PANTOPRAZOLE SODIUM 40 MG PO TBEC
80.0000 mg | DELAYED_RELEASE_TABLET | Freq: Every day | ORAL | Status: DC
  Administered 2021-05-19: 80 mg via ORAL
  Filled 2021-05-18 (×2): qty 2

## 2021-05-18 MED ORDER — LORAZEPAM 2 MG/ML IJ SOLN
0.0000 mg | Freq: Three times a day (TID) | INTRAMUSCULAR | Status: DC
Start: 2021-05-20 — End: 2021-05-19

## 2021-05-18 MED ORDER — LIDOCAINE HCL (PF) 1 % IJ SOLN
5.0000 mL | Freq: Once | INTRAMUSCULAR | Status: AC
  Administered 2021-05-18: 5 mL via INTRADERMAL
  Filled 2021-05-18: qty 5

## 2021-05-18 MED ORDER — ONDANSETRON HCL 4 MG PO TABS: 4.0000 mg | ORAL_TABLET | Freq: Four times a day (QID) | ORAL | Status: DC | PRN

## 2021-05-18 MED ORDER — DESVENLAFAXINE SUCCINATE ER 100 MG PO TB24
100.0000 mg | ORAL_TABLET | Freq: Every day | ORAL | Status: DC
  Administered 2021-05-19: 100 mg via ORAL
  Filled 2021-05-18: qty 1

## 2021-05-18 MED ORDER — VITAMIN D (ERGOCALCIFEROL) 1.25 MG (50000 UNIT) PO CAPS
50000.0000 [IU] | ORAL_CAPSULE | ORAL | Status: DC
  Administered 2021-05-19: 50000 [IU] via ORAL
  Filled 2021-05-18: qty 1

## 2021-05-18 MED ORDER — LORAZEPAM 1 MG PO TABS: 1.0000 mg | ORAL_TABLET | ORAL | Status: DC | PRN

## 2021-05-18 MED ORDER — LACTATED RINGERS IV BOLUS
1000.0000 mL | Freq: Once | INTRAVENOUS | Status: AC
  Administered 2021-05-18: 1000 mL via INTRAVENOUS

## 2021-05-18 MED ORDER — ONDANSETRON HCL 4 MG/2ML IJ SOLN: 4.0000 mg | Freq: Four times a day (QID) | INTRAMUSCULAR | Status: DC | PRN

## 2021-05-18 MED ORDER — LORAZEPAM 2 MG/ML IJ SOLN
INTRAMUSCULAR | Status: AC
  Filled 2021-05-18: qty 1

## 2021-05-18 MED ORDER — SUCRALFATE 1 G PO TABS
1.0000 g | ORAL_TABLET | Freq: Three times a day (TID) | ORAL | Status: DC | PRN
  Filled 2021-05-18: qty 1

## 2021-05-18 MED ORDER — LEVETIRACETAM IN NACL 1500 MG/100ML IV SOLN
1500.0000 mg | Freq: Once | INTRAVENOUS | Status: AC
  Administered 2021-05-18: 1500 mg via INTRAVENOUS
  Filled 2021-05-18: qty 100

## 2021-05-18 MED ORDER — ADULT MULTIVITAMIN W/MINERALS CH
1.0000 | ORAL_TABLET | Freq: Every day | ORAL | Status: DC
  Administered 2021-05-18 – 2021-05-19 (×2): 1 via ORAL
  Filled 2021-05-18 (×2): qty 1

## 2021-05-18 NOTE — ED Provider Notes (Signed)
[facility] EMERGENCY DEPARTMENT Provider Note   CSN: [id number] Arrival date & time: 05/18/21  1644     History Chief Complaint  Patient presents with   Seizures    Gwendy B Dobson is a 60 y.o. female.  Patient is a 60 year old female with a past medical history of alcohol induced pancreatitis, anxiety, hypertension who is presenting for new onset seizures.  Husband reports that patient had a 3-minute seizure at home in which she had bilateral arm contractions.  He said they lasted for 3 minutes.  Patient was postictal when EMS arrived.  She was alert and oriented x4.  She had a seizure when she got to our emergency department.  She had full body convulsions.  Her husband reports that patient was slow to respond this morning but thought it was just because she was distracted watching TV.   Husband of patient states that she has not been experiencing any fevers, chills, headache, neck pain, chest pain, shortness of breath, nausea,vomiting, diarrhea, numbness or weakness prior to her seizure activity.    Seizures     Past Medical History:  Diagnosis Date   Abnormal LFTs    ADD (attention deficit disorder)    Alcohol-induced pancreatitis    Anxiety    Arthritis    Asthma    allergy induced per patient   Disc disorder    Hyperlipidemia    Hypertension    Pneumonia     Patient Active Problem List   Diagnosis Date Noted   Surgery, elective 02/05/2021   History of T12 kyphoplasty 01/24/2019   Hip pain, acute, right 01/16/2019   Acute leg pain, right 01/16/2019   Sciatica of right side without back pain 01/16/2019   Latex precautions, history of latex allergy 11/02/2018   Lesion of right native kidney 08/30/2018   Abnormal MRI, lumbar spine (08/14/2018) 08/23/2018   Lumbar lateral recess stenosis (Left: L2-3) (Bilateral: L3-4) (Right: L4-5) 08/23/2018   Lumbar Grade 1 Retrolisthesis of L2/L3, L3/L4, & L4/L5 08/23/2018   Grade 1 Lumbar Anterolisthesis of  L5/S1 08/23/2018   Lumbar facet hypertrophy (Multilevel) (Bilateral) 08/19/2018   Lumbar foraminal stenosis (Left: L2-3, L3-4) (Bilateral: L4-5, L5-S1) 08/19/2018   Lumbar central spinal stenosis 08/19/2018   DDD (degenerative disc disease), lumbar 08/05/2018   Traumatic compression fracture of T12 (<15%) thoracic vertebra, sequela 08/05/2018   Abnormal x-ray of lumbar spine (08/02/2018) 08/05/2018   Disorder of skeletal system 08/05/2018   Pharmacologic therapy 08/05/2018   Problems influencing health status 08/05/2018   Chronic pain syndrome 08/05/2018   Class 1 obesity with body mass index (BMI) of 33.0 to 33.9 in adult 05/24/2018   Seasonal allergic rhinitis due to pollen 02/03/2017   Chronic low back pain (Bilateral) (R>L) 08/04/2016   Long term current use of opiate analgesic 08/04/2016   Long term prescription opiate use 08/04/2016   Opiate use 08/04/2016   Spondylosis without myelopathy or radiculopathy, lumbar region 08/04/2016   ADD (attention deficit disorder) 05/22/2016   GAD (generalized anxiety disorder) 05/22/2016   Hypertension, essential 05/22/2016   Panic attacks 05/22/2016   Essential hypertension 08/30/2015   Back muscle spasm 01/31/2014   Abnormal LFTs 01/31/2014   Blood glucose elevated 01/31/2014   Insomnia 01/31/2014   Lumbar facet syndrome (Bilateral) (R>L) 01/31/2014   Elevated LFTs 01/31/2014   Avitaminosis D 07/03/2011   Vitamin D deficiency 07/03/2011   HLD (hyperlipidemia) 06/23/2011   Hypertriglyceridemia 06/23/2011    Past Surgical History:  Procedure Laterality Date  CESAREAN SECTION     CHOLECYSTECTOMY     FOOT SURGERY Left    FRACTURE SURGERY Right 07/2020   R wrist    HERNIA REPAIR     KNEE ARTHROSCOPY     KYPHOPLASTY N/A 10/13/2018   Procedure: THORACIC 12 KYPHOPLASTY;  Surgeon: Phylliss Bob, MD;  Location: [facility];  Service: Orthopedics;  Laterality: N/A;   KYPHOPLASTY N/A 02/05/2021   Procedure: LUMBAR ONE  AND LUMBAR FOUR  KYPHOPLASTY;  Surgeon: Phylliss Bob, MD;  Location: [facility];  Service: Orthopedics;  Laterality: N/A;     OB History   No obstetric history on file.     No family history on file.  Social History   Tobacco Use   Smoking status: Never   Smokeless tobacco: Never  Vaping Use   Vaping Use: Never used  Substance Use Topics   Alcohol use: Not Currently   Drug use: Never    Home Medications Prior to Admission medications   Medication Sig Start Date End Date Taking? Authorizing Provider  acetaminophen (TYLENOL) 500 MG tablet Take 1,000 mg by mouth every 8 (eight) hours as needed for moderate pain.    [provider]  ALPRAZolam [name] 0.5 MG tablet Take 0.5 mg by mouth 3 (three) times daily as needed for anxiety. 07/09/16   [provider]  atorvastatin (LIPITOR) 40 MG tablet Take 40 mg by mouth daily.  06/12/16   [provider]  desvenlafaxine (PRISTIQ) 50 MG 24 hr tablet Take 50 mg by mouth daily. 05/04/20   [provider]  fenofibrate 160 MG tablet Take 160 mg by mouth daily.  06/10/16   [provider]  HYDROcodone-acetaminophen (NORCO) 5-325 MG tablet Take 1 tablet by mouth every 6 (six) hours as needed for moderate pain. 02/05/21   McKenzie, Lennie Muckle, PA-C  methocarbamol (ROBAXIN) 500 MG tablet Take 1 tablet (500 mg total) by mouth every 6 (six) hours as needed for muscle spasms. 02/05/21   McKenzie, Lennie Muckle, PA-C  omeprazole (PRILOSEC) 40 MG capsule Take 40 mg by mouth daily.    [provider]  ondansetron (ZOFRAN-ODT) 8 MG disintegrating tablet DISSOLVE 1 TABLET BY MOUTH EVERY 4 HOURS AS NEEDED FOR NAUSEA 10/23/20 10/23/21  Veryl Speak, MD  pantoprazole (PROTONIX) 20 MG tablet Take 1 tablet (20 mg total) by mouth 2 (two) times daily. Patient not taking: Reported on 01/29/2021 09/03/20   Veryl Speak, MD  promethazine (PHENERGAN) 25 MG tablet Take 0.5-1 tablets (12.5-25 mg total) by mouth every 8 (eight) hours as needed for nausea.  09/25/20   Davonna Belling, MD  sucralfate (CARAFATE) 1 g tablet Take 1 tablet (1 g total) by mouth 3 (three) times daily with meals. Patient not taking: Reported on 01/29/2021 09/03/20   Veryl Speak, MD  tiZANidine (ZANAFLEX) 4 MG tablet Take 4 mg by mouth at bedtime as needed for muscle spasms.  04/26/20   [provider]  valsartan-hydrochlorothiazide (DIOVAN-HCT) 160-25 MG tablet Take 1 tablet by mouth daily.    [provider]  Vitamin D, Ergocalciferol, (DRISDOL) 50000 units CAPS capsule Take 50,000 Units by mouth every 7 (seven) days.  03/10/16   [provider]  zolpidem (AMBIEN CR) 12.5 MG CR tablet Take 12.5 mg by mouth at bedtime as needed for sleep.  07/16/16   [provider]    Allergies    Latex  Review of Systems   Review of Systems  Constitutional:  Negative for chills, diaphoresis, fatigue and fever.  HENT:  Negative for congestion, dental problem, ear discharge, ear pain, facial swelling, hearing loss, nosebleeds, postnasal drip, rhinorrhea, sinus pain, sneezing, sore throat and trouble swallowing.   Eyes:  Negative for pain and visual disturbance.  Respiratory:  Negative for cough, chest tightness, shortness of breath, wheezing and stridor.   Cardiovascular:  Negative for chest pain, palpitations and leg swelling.  Gastrointestinal:  Negative for abdominal distention, abdominal pain, blood in stool, constipation, diarrhea, nausea and vomiting.  Endocrine: Negative for polydipsia and polyuria.  Genitourinary:  Negative for difficulty urinating, dysuria, flank pain, frequency, hematuria, urgency, vaginal bleeding and vaginal discharge.  Musculoskeletal:  Negative for myalgias, neck pain and neck stiffness.  Skin:  Negative for rash and wound.  Allergic/Immunologic: Negative for environmental allergies and food allergies.  Neurological:  Positive for seizures. Negative for dizziness, syncope, facial asymmetry, speech difficulty, weakness,  light-headedness, numbness and headaches.  Psychiatric/Behavioral:  Negative for agitation, behavioral problems and confusion.    Physical Exam Updated Vital Signs BP (!) 189/95 (BP Location: Right Arm)   Pulse (!) 110   Temp 98.9 F (37.2 C) (Oral)   Resp (!) 22   Ht '5\' 8"'$  (1.727 m)   Wt 90.7 kg   LMP 06/30/2011   SpO2 100%   BMI 30.41 kg/m   Physical Exam Vitals and nursing note reviewed.  Constitutional:      General: She is not in acute distress.    Appearance: Normal appearance. She is normal weight. She is not ill-appearing.  HENT:     Head: Normocephalic and atraumatic.     Right Ear: External ear normal.     Left Ear: External ear normal.     Nose: Nose normal. No congestion.     Mouth/Throat:     Mouth: Mucous membranes are moist.     Pharynx: Oropharynx is clear. No oropharyngeal exudate or posterior oropharyngeal erythema.  Eyes:     General: No visual field deficit.    Extraocular Movements: Extraocular movements intact.     Conjunctiva/sclera: Conjunctivae normal.     Pupils: Pupils are equal, round, and reactive to light.  Neck:     Vascular: No carotid bruit.  Cardiovascular:     Rate and Rhythm: Normal rate and regular rhythm.     Pulses: Normal pulses.     Heart sounds: Normal heart sounds. No murmur heard. Pulmonary:     Effort: Pulmonary effort is normal. No respiratory distress.     Breath sounds: Normal breath sounds. No stridor. No wheezing, rhonchi or rales.  Chest:     Chest wall: No tenderness.  Abdominal:     General: Bowel sounds are normal. There is no distension.     Palpations: Abdomen is soft.     Tenderness: There is no abdominal tenderness. There is no right CVA tenderness, left CVA tenderness, guarding or rebound.  Musculoskeletal:        General: No swelling or tenderness. Normal range of motion.     Cervical back: Normal range of motion and neck supple. No rigidity, tenderness or bony tenderness.     Thoracic back: Normal. No  tenderness or bony tenderness.     Lumbar back: Normal. No tenderness or bony tenderness.     Right lower leg: No edema.     Left lower leg: No edema.  Skin:    General: Skin is warm and dry.     Coloration: Skin is not jaundiced.  Neurological:     General: No focal deficit present.  Mental Status: She is alert and oriented to person, place, and time. Mental status is at baseline.     Cranial Nerves: Cranial nerves are intact. No cranial nerve deficit, dysarthria or facial asymmetry.     Sensory: Sensation is intact. No sensory deficit.     Motor: Motor function is intact. No weakness.     Coordination: Coordination is intact. Finger-Nose-Finger Test normal.     Gait: Gait is intact. Gait normal.     Comments: Initially confused but no alert and oriented x4  Psychiatric:        Mood and Affect: Mood normal.        Behavior: Behavior normal.        Thought Content: Thought content normal.        Judgment: Judgment normal.    ED Results / Procedures / Treatments   Labs (all labs ordered are listed, but only abnormal results are displayed) Labs Reviewed  I-STAT CHEM 8, ED - Abnormal; Notable for the following components:      Result Value   Potassium 3.0 (*)    Glucose, Bld 160 (*)    Calcium, Ion 1.10 (*)    TCO2 20 (*)    All other components within normal limits  CBG MONITORING, ED - Abnormal; Notable for the following components:   Glucose-Capillary 157 (*)    All other components within normal limits  CBC WITH DIFFERENTIAL/PLATELET  COMPREHENSIVE METABOLIC PANEL  URINALYSIS, ROUTINE W REFLEX MICROSCOPIC  RAPID URINE DRUG SCREEN, HOSP PERFORMED    EKG None  Radiology DG Chest Portable 1 View  Result Date: 05/18/2021 CLINICAL DATA:  Seizure. EXAM: PORTABLE CHEST 1 VIEW COMPARISON:  03/17/2019 FINDINGS: Low lung volumes. Heart size is normal accounting for low lung volumes. 9 there is subsegmental atelectasis at the RIGHT lung base. No pulmonary edema or  consolidation. IMPRESSION: RIGHT LOWER lobe atelectasis. Electronically Signed   By: Nolon Nations M.D.   On: 05/18/2021 17:16    Procedures .Lumbar Puncture  Date/Time: 05/18/2021 8:35 PM Performed by: Doretha Sou, MD Authorized by: Sherwood Gambler, MD   Consent:    Consent obtained:  Verbal and written   Consent given by:  Patient and spouse   Risks, benefits, and alternatives were discussed: yes     Risks discussed:  Bleeding, infection, pain, headache, nerve damage and repeat procedure Universal protocol:    Relevant documents present and verified: yes     Patient identity confirmed:  Verbally with patient, arm band and hospital-assigned identification number Pre-procedure details:    Procedure purpose:  Diagnostic   Preparation: Patient was prepped and draped in usual sterile fashion   Anesthesia:    Anesthesia method:  Local infiltration   Local anesthetic:  Lidocaine 1% w/o epi Procedure details:    Lumbar space:  L4-L5 interspace   Patient position:  Sitting   Number of attempts:  1   Fluid appearance:  Clear   Tubes of fluid:  4 Post-procedure details:    Puncture site:  Adhesive bandage applied   Procedure completion:  Tolerated well, no immediate complications   Medications Ordered in ED Medications  levETIRAcetam (KEPPRA) IVPB 1500 mg/ 100 mL premix (1,500 mg Intravenous New Bag/Given 05/18/21 1723)  LORazepam (ATIVAN) 2 MG/ML injection (  Given 05/18/21 1650)    ED Course  I have reviewed the triage vital signs and the nursing notes.  Pertinent labs & imaging results that were available during my care of the patient were reviewed by me  and considered in my medical decision making (see chart for details).    MDM Rules/Calculators/A&P                         CLARE HOUDESHELL is a 60 y.o. female with a past medical history of alcohol induced pancreatitis, anxiety, hypertension who is presenting for new onset seizures. On arrival to our ED patient has a full  seizure. She was post ictal after the event. She was given IV ativan. She was given an IV keppra load. The patient was initially confused but has returned back to her mental baseline. Patient endorses drinking about 24 oz of vodka a day for the last several years. She denies any previous history of seizures. She has never had withdrawal from seizures. Neurology was consulted for evaluation. Her poct glucose was 157. Her CBC showed a leukocytosis of 11.9. Her CMP was notable for a potassium of 3 and an elevated anion gap. Covid test was negative. CXR showed right lower lobe atelectasis. CT head showed no acute intracranial abnormality.   Neurology recommended an LP. LP performed without complications. LP showed mildly elevated protein likely reactive from seizure. No pleocytosis.   Patient will be admitted to hospitalist for new onset seizures and further evaluation with neurology following the patient.   Patient states compliance and understanding of the plan. I explained labs and imaging to the patient. No further questions at this time from the patient.  The plan for this patient was discussed with Dr. Regenia Skeeter, who voiced agreement and who oversaw evaluation and treatment of this patient.   Final Clinical Impression(s) / ED Diagnoses Final diagnoses:  None    Rx / DC Orders ED Discharge Orders     None        Doretha Sou, MD 05/19/21 [id number]    Sherwood Gambler, MD 05/23/21 [id number]

## 2021-05-18 NOTE — ED Triage Notes (Signed)
Pt BIB EMS C/O witnessed Seizure, actively seizing upon arrival in the ER. Postictal upon EMS arrival at home for few minutes and became AO x 4, seized en route. No known H/O Seizure.

## 2021-05-18 NOTE — ED Notes (Signed)
Husband's at the bedside.

## 2021-05-18 NOTE — H&P (Signed)
History and Physical    Rhonda Espinoza [id number] DOB: [birth year] DOA: 05/18/2021  PCP: Chesley Noon, MD  Patient coming from: Home  I have personally briefly reviewed patient's old medical records in [place]  Chief Complaint: Seizures  HPI: Rhonda Espinoza is a 60 y.o. female with medical history significant of CPS, chronic pancreatitis, HTN.  Pt presenting for new onset seizures.  Husband reports that patient had a 3-minute seizure at home which she had bilateral arm contractions.  He said lasted for 3 minutes.  Patient was postictal when EMS arrived.  She was alert and oriented x4.  She had a seizure when she got to our emergency department.  She had full body convulsions.  Additionally, pt slow to respond this AM per husband, thought it was just because she was distracted watching TV.  No fevers, chills.   ED Course: Had another seizure in ED as noted above.  Loaded with keppra.  CT head neg.  LP performed and results pending.  AG of 24  Hypertensive with mild tachycardia.  Mental status has slowly improved.  Now AAOx4:  Pt admits to drinking ~24oz of vodka a day in the evenings (ongoing).  Last drink was yesterday evening.  Pt has tremors of BUE: gets these when ever she stops drinking.   Review of Systems: As per HPI, otherwise all review of systems negative.  Past Medical History:  Diagnosis Date   Abnormal LFTs    ADD (attention deficit disorder)    Alcohol-induced pancreatitis    Anxiety    Arthritis    Asthma    allergy induced per patient   Disc disorder    Hyperlipidemia    Hypertension    Pneumonia     Past Surgical History:  Procedure Laterality Date   CESAREAN SECTION     CHOLECYSTECTOMY     FOOT SURGERY Left    FRACTURE SURGERY Right 07/2020   R wrist    HERNIA REPAIR     KNEE ARTHROSCOPY     KYPHOPLASTY N/A 10/13/2018   Procedure: THORACIC 12 KYPHOPLASTY;  Surgeon: Phylliss Bob, MD;  Location: [place];  Service:  Orthopedics;  Laterality: N/A;   KYPHOPLASTY N/A 02/05/2021   Procedure: LUMBAR ONE  AND LUMBAR FOUR KYPHOPLASTY;  Surgeon: Phylliss Bob, MD;  Location: [place];  Service: Orthopedics;  Laterality: N/A;     reports that she has never smoked. She has never used smokeless tobacco. She reports that she does not currently use alcohol. She reports that she does not use drugs.  Allergies  Allergen Reactions   Latex Other (See Comments)    "more like bandages" CAUSES BLISTERS    Family History  Problem Relation Age of Onset   Seizures Neg Hx      Prior to Admission medications   Medication Sig Start Date End Date Taking? Authorizing Provider  acetaminophen (TYLENOL) 500 MG tablet Take 1,000 mg by mouth every 8 (eight) hours as needed for moderate pain.    [provider]  ALPRAZolam [name] 0.5 MG tablet Take 0.5 mg by mouth 3 (three) times daily as needed for anxiety. 07/09/16   [provider]  atorvastatin (LIPITOR) 40 MG tablet Take 40 mg by mouth daily.  06/12/16   [provider]  desvenlafaxine (PRISTIQ) 50 MG 24 hr tablet Take 50 mg by mouth daily. 05/04/20   [provider]  fenofibrate 160 MG tablet Take 160 mg by mouth daily.  06/10/16   [provider]  HYDROcodone-acetaminophen (NORCO) 5-325 MG tablet Take 1 tablet by mouth every 6 (six) hours as needed for moderate pain. 02/05/21   McKenzie, Lennie Muckle, PA-C  methocarbamol (ROBAXIN) 500 MG tablet Take 1 tablet (500 mg total) by mouth every 6 (six) hours as needed for muscle spasms. 02/05/21   McKenzie, Lennie Muckle, PA-C  omeprazole (PRILOSEC) 40 MG capsule Take 40 mg by mouth daily.    [provider]  ondansetron (ZOFRAN-ODT) 8 MG disintegrating tablet DISSOLVE 1 TABLET BY MOUTH EVERY 4 HOURS AS NEEDED FOR NAUSEA 10/23/20 10/23/21  Veryl Speak, MD  pantoprazole (PROTONIX) 20 MG tablet Take 1 tablet (20 mg total) by mouth 2 (two) times daily. Patient not taking: Reported on 01/29/2021  09/03/20   Veryl Speak, MD  promethazine (PHENERGAN) 25 MG tablet Take 0.5-1 tablets (12.5-25 mg total) by mouth every 8 (eight) hours as needed for nausea. 09/25/20   Davonna Belling, MD  sucralfate (CARAFATE) 1 g tablet Take 1 tablet (1 g total) by mouth 3 (three) times daily with meals. 09/03/20   Veryl Speak, MD  tiZANidine (ZANAFLEX) 4 MG tablet Take 4 mg by mouth at bedtime as needed for muscle spasms.  04/26/20   [provider]  valsartan-hydrochlorothiazide (DIOVAN-HCT) 160-25 MG tablet Take 1 tablet by mouth daily.    [provider]  Vitamin D, Ergocalciferol, (DRISDOL) 50000 units CAPS capsule Take 50,000 Units by mouth every Sunday. 03/10/16   [provider]  zolpidem (AMBIEN CR) 12.5 MG CR tablet Take 12.5 mg by mouth at bedtime as needed for sleep.  07/16/16   [provider]    Physical Exam: Vitals:   05/18/21 1800 05/18/21 1906 05/18/21 1930 05/18/21 2045  BP: (!) 156/82 (!) 176/88 (!) 189/77 (!) 183/100  Pulse: 83 88 (!) 101 (!) 104  Resp: '20 18 18 16  '$ Temp:      TempSrc:      SpO2: 98% 100% 99% 96%  Weight:      Height:        Constitutional: NAD, calm, comfortable Eyes: PERRL, lids and conjunctivae normal ENMT: Mucous membranes are moist. Posterior pharynx clear of any exudate or lesions.Normal dentition.  Neck: normal, supple, no masses, no thyromegaly Respiratory: clear to auscultation bilaterally, no wheezing, no crackles. Normal respiratory effort. No accessory muscle use.  Cardiovascular: Mild tachy, no murmurs / rubs / gallops. No extremity edema. 2+ pedal pulses. No carotid bruits.  Abdomen: no tenderness, no masses palpated. No hepatosplenomegaly. Bowel sounds positive.  Musculoskeletal: no clubbing / cyanosis. No joint deformity upper and lower extremities. Good ROM, no contractures. Normal muscle tone.  Skin: no rashes, lesions, ulcers. No induration Neurologic: Tremor present. Psychiatric: Normal judgment and  insight. Alert and oriented x 3. Normal mood.    Labs on Admission: I have personally reviewed following labs and imaging studies  CBC: Recent Labs  Lab 05/18/21 1700 05/18/21 1713  WBC 11.9*  --   NEUTROABS 6.9  --   HGB 12.4 12.2  HCT 37.9 36.0  MCV 105.3*  --   PLT 482*  --    Basic Metabolic Panel: Recent Labs  Lab 05/18/21 1700 05/18/21 1713  NA 142 141  K 3.0* 3.0*  CL 102 105  CO2 16*  --   GLUCOSE 162* 160*  BUN 10 10  CREATININE 0.82 0.60  CALCIUM 9.5  --    GFR: Estimated Creatinine Clearance: 89.2 mL/min (by C-G formula based on SCr of 0.6 mg/dL). Liver Function Tests:  Recent Labs  Lab 05/18/21 1700  AST 34  ALT 24  ALKPHOS 95  BILITOT 0.7  PROT 7.0  ALBUMIN 3.9   No results for input(s): LIPASE, AMYLASE in the last 168 hours. No results for input(s): AMMONIA in the last 168 hours. Coagulation Profile: No results for input(s): INR, PROTIME in the last 168 hours. Cardiac Enzymes: No results for input(s): CKTOTAL, CKMB, CKMBINDEX, TROPONINI in the last 168 hours. BNP (last 3 results) No results for input(s): PROBNP in the last 8760 hours. HbA1C: No results for input(s): HGBA1C in the last 72 hours. CBG: Recent Labs  Lab 05/18/21 1653  GLUCAP 157*   Lipid Profile: No results for input(s): CHOL, HDL, LDLCALC, TRIG, CHOLHDL, LDLDIRECT in the last 72 hours. Thyroid Function Tests: No results for input(s): TSH, T4TOTAL, FREET4, T3FREE, THYROIDAB in the last 72 hours. Anemia Panel: No results for input(s): VITAMINB12, FOLATE, FERRITIN, TIBC, IRON, RETICCTPCT in the last 72 hours. Urine analysis:    Component Value Date/Time   COLORURINE YELLOW 02/04/2021 0830   APPEARANCEUR HAZY (A) 02/04/2021 0830   LABSPEC 1.015 02/04/2021 0830   PHURINE 5.0 02/04/2021 0830   GLUCOSEU NEGATIVE 02/04/2021 0830   HGBUR SMALL (A) 02/04/2021 0830   BILIRUBINUR NEGATIVE 02/04/2021 0830   KETONESUR NEGATIVE 02/04/2021 0830   PROTEINUR NEGATIVE 02/04/2021  0830   NITRITE NEGATIVE 02/04/2021 0830   LEUKOCYTESUR LARGE (A) 02/04/2021 0830    Radiological Exams on Admission: CT HEAD WO CONTRAST (5MM)  Result Date: 05/18/2021 CLINICAL DATA:  Seizure EXAM: CT HEAD WITHOUT CONTRAST TECHNIQUE: Contiguous axial images were obtained from the base of the skull through the vertex without intravenous contrast. COMPARISON:  None. FINDINGS: Brain: Normal anatomic configuration. No abnormal intra or extra-axial mass lesion or fluid collection. No abnormal mass effect or midline shift. No evidence of acute intracranial hemorrhage or infarct. Ventricular size is normal. Cerebellum unremarkable. Vascular: Unremarkable Skull: Intact Sinuses/Orbits: Paranasal sinuses are clear. Orbits are unremarkable. Other: Mastoid air cells and middle ear cavities are clear. IMPRESSION: No acute intracranial abnormality.  Unremarkable examination. Electronically Signed   By: Fidela Salisbury M.D.   On: 05/18/2021 18:56   DG Chest Portable 1 View  Result Date: 05/18/2021 CLINICAL DATA:  Seizure. EXAM: PORTABLE CHEST 1 VIEW COMPARISON:  03/17/2019 FINDINGS: Low lung volumes. Heart size is normal accounting for low lung volumes. 9 there is subsegmental atelectasis at the RIGHT lung base. No pulmonary edema or consolidation. IMPRESSION: RIGHT LOWER lobe atelectasis. Electronically Signed   By: Nolon Nations M.D.   On: 05/18/2021 17:16    EKG: Independently reviewed.  Assessment/Plan Principal Problem:   Alcohol withdrawal seizure ([place]) Active Problems:   Hypertension, essential   Chronic pain syndrome   Seizures (HCC)   Hypokalemia   Acute encephalopathy   ETOH abuse    Seizures and AMS - Neurology consulted LP performed and results pending Mental status improved at this point HPI now is highly suggestive for EtOH withdrawal seizures with withdrawal tremors on exam. EtOH level pending Plan: CIWA SDU protocol with scheduled IV ativan Pt loaded with keppra, will defer  further keppra dosing (if any) to neurology. CT head neg AG 24 - Lactate and BHB pending but suspect this most likely transient lactic acidosis due to witnessed seizure activity in ED at time of labs. HTN - PRN IV labetalol Cont ARB Hold HCTZ CPS - Cont home meds Looks like no longer on chronic opiates at this time, just as well as there is a huge risk  of OD / fatal OD if mixed with alcohol. Hypokalemia - Replace Check Mg EtOH abuse - As above Anxiety - Hold home xanax for the moment (pt already getting scheduled + PRN ativan).  DVT prophylaxis: Lovenox (start tomorrow, LP) Code Status: Full Family Communication: Husband at bedside Disposition Plan: Home after DTs resolved Consults called: Dr. Lorrin Goodell Admission status: Place in obs - likely convert to [place], [place] Hospitalists  How to contact the [facility] Attending or Consulting provider [place] or covering provider during after hours [place], for this patient?  Check the care team in [facility] and look for a) attending/consulting [facility] provider listed and b) the [facility] team listed Log into [email or web address]  Amion Physician Scheduling and messaging for groups and whole hospitals  On call and physician scheduling software for group practices, residents, hospitalists and other medical providers for call, clinic, rotation and shift schedules. On[employer] is a hospital-wide system for scheduling doctors and paging doctors on call. EasyPlot is for scientific plotting and data analysis.  [email or web address]  and use [place]'s universal password to access. If you do not have the password, please contact the hospital operator.  Locate the [facility] provider you are looking for under [facility] and page to a number that you can be directly reached. If you still have difficulty reaching the provider, please page the [facility] (Director on Call) for the Hospitalists listed on amion for assistance.  05/18/2021, 9:15 PM

## 2021-05-18 NOTE — Consult Note (Signed)
NEUROLOGY CONSULTATION NOTE   Date of service: May 18, 2021 Patient Name: Rhonda Espinoza MRN:  [id number] DOB:  1961-03-23 Reason for consult: "Seizures" Requesting Provider: Sherwood Gambler, MD _ _ _   _ __   _ __ _ _  __ __   _ __   __ _  History of Present Illness  Rhonda Espinoza is a 60 y.o. female with PMH significant for anxiety, HTN, asthma, HLD, alcohol induced pancreatitis who had a new onset GTC seizure at home that lasted 3 mins. Was post ictal when brought in by EMS. Had another GTC in the ED.  CT head in the ED with no acute intracranial abnormalities.  Patient has no recollection of the seizure. Husband reports that he was in the other room, heard a loud scream and went in to find her seizing in the bed. Her arms were stretched out and her entire body kept jerking. Eye open and rolled back in her head. She had spit in her mouth. He did not notice blood in her mouth, she did not lose control of bowel or bladder. She was snoring afterwards. He called EMS. She had another seizure in the ED. It was witnessed by staff, GTC with spontaneous resolution.  She drinks about 24 Oz of Vodka for a day and has been doing it for that last decade or 2. Substitutes meals with alcohol and eats very little. Denies having any prior hx of seizures, never had withdrawal. Noted to be tremoring and on my discussion with her, feels that she tremors when she cuts down on her alcohol. The tremors go away if she drinks.  No prior significant head injury with LOC, no prior stroke, no hx of ICH, no CNS surgery.  Denies any headache, no neck pain or rigidity, no fever, no chills, no recent URI like symptoms. Has some sniffles but that is normal. Has urinary urgency which is chronic but no burning, no hematuria.   ROS   Constitutional Denies weight loss, fever and chills.   HEENT Denies changes in vision and hearing.   Respiratory Denies SOB and cough.   CV Denies palpitations and CP   GI Denies abdominal  pain, nausea, vomiting and diarrhea.   GU Denies dysuria and urinary frequency.   MSK Denies myalgia and joint pain.   Skin Denies rash and pruritus.   Neurological Denies headache and syncope.   Psychiatric Denies recent changes in mood. Denies anxiety and depression.    Past History   Past Medical History:  Diagnosis Date   Abnormal LFTs    ADD (attention deficit disorder)    Alcohol-induced pancreatitis    Anxiety    Arthritis    Asthma    allergy induced per patient   Disc disorder    Hyperlipidemia    Hypertension    Pneumonia    Past Surgical History:  Procedure Laterality Date   CESAREAN SECTION     CHOLECYSTECTOMY     FOOT SURGERY Left    FRACTURE SURGERY Right 07/2020   R wrist    HERNIA REPAIR     KNEE ARTHROSCOPY     KYPHOPLASTY N/A 10/13/2018   Procedure: THORACIC 12 KYPHOPLASTY;  Surgeon: Phylliss Bob, MD;  Location: [place];  Service: Orthopedics;  Laterality: N/A;   KYPHOPLASTY N/A 02/05/2021   Procedure: LUMBAR ONE  AND LUMBAR FOUR KYPHOPLASTY;  Surgeon: Phylliss Bob, MD;  Location: [place];  Service: Orthopedics;  Laterality: N/A;   No family history on  file. Social History   Socioeconomic History   Marital status: Married    Spouse name: Not on file   Number of children: Not on file   Years of education: Not on file   Highest education level: Not on file  Occupational History   Not on file  Tobacco Use   Smoking status: Never   Smokeless tobacco: Never  Vaping Use   Vaping Use: Never used  Substance and Sexual Activity   Alcohol use: Not Currently   Drug use: Never   Sexual activity: Not on file  Other Topics Concern   Not on file  Social History Narrative   Not on file   Social Determinants of Health   Financial Resource Strain: Not on file  Food Insecurity: Not on file  Transportation Needs: Not on file  Physical Activity: Not on file  Stress: Not on file  Social Connections: Not on file   Allergies  Allergen Reactions   Latex  Other (See Comments)    "more like bandages" CAUSES BLISTERS    Medications  (Not in a hospital admission)    Vitals   Vitals:   05/18/21 1659 05/18/21 1730 05/18/21 1800 05/18/21 1906  BP:  (!) 195/86 (!) 156/82 (!) 176/88  Pulse:  (!) 106 83 88  Resp:  (!) '21 20 18  '$ Temp:      TempSrc:      SpO2:  90% 98% 100%  Weight: 90.7 kg     Height: '5\' 8"'$  (1.727 m)        Body mass index is 30.41 kg/m.  Physical Exam   General: Laying comfortably in bed; in no acute distress.  HENT: Normal oropharynx and mucosa. Normal external appearance of ears and nose.  Neck: Supple, no pain or tenderness  CV: No JVD. No peripheral edema.  Pulmonary: Symmetric Chest rise. Normal respiratory effort.  Abdomen: Soft to touch, non-tender.  Ext: No cyanosis, edema, or deformity  Skin: No rash. Normal palpation of skin.   Musculoskeletal: Normal digits and nails by inspection. No clubbing.   Neurologic Examination  Mental status/Cognition: Alert, oriented to self, place, month and year, good attention. Speech/language: Fluent, comprehension intact, object naming intact, repetition intact. Cranial nerves:   CN II Pupils equal and reactive to light, no VF deficits   CN III,IV,VI EOM intact, no gaze preference or deviation, no nystagmus    CN V normal sensation in V1, V2, and V3 segments bilaterally    CN VII no asymmetry, no nasolabial fold flattening    CN VIII normal hearing to speech    CN IX & X normal palatal elevation, no uvular deviation    CN XI 5/5 head turn and 5/5 shoulder shrug bilaterally    CN XII midline tongue protrusion    No nuchal rigidity, neg kernigs and brudzinski's sign.  Motor:  Muscle bulk: normal, tone normal, pronator drift none. High frequency and low amplitude tremors throughout her body. Mvmt Root Nerve  Muscle Right Left Comments  SA C5/6 Ax Deltoid 5 5   EF C5/6 Mc Biceps 5 4+   EE C6/7/8 Rad Triceps 5 4+   WF C6/7 Med FCR     WE C7/8 PIN ECU     F Ab  C8/T1 U ADM/FDI 5 4+   HF L1/2/3 Fem Illopsoas 5 4+   KE L2/3/4 Fem Quad 5 5   DF L4/5 D Peron Tib Ant 5 5   PF S1/2 Tibial Grc/Sol 5 5  Reflexes:  Right Left Comments  Pectoralis      Biceps (C5/6) 2 2   Brachioradialis (C5/6) 2 2    Triceps (C6/7) 2 2    Patellar (L3/4) 2 2    Achilles (S1)      Hoffman      Plantar     Jaw jerk    Sensation:  Light touch intact   Pin prick    Temperature    Vibration   Proprioception    Coordination/Complex Motor:  - Finger to Nose intact BL - Heel to shin intact BL - Rapid alternating movement are normal BL - Gait: Did not assess.  Labs   CBC:  Recent Labs  Lab 05/18/21 1700 05/18/21 1713  WBC 11.9*  --   NEUTROABS 6.9  --   HGB 12.4 12.2  HCT 37.9 36.0  MCV 105.3*  --   PLT 482*  --     Basic Metabolic Panel:  Lab Results  Component Value Date   NA 141 05/18/2021   K 3.0 (L) 05/18/2021   CO2 16 (L) 05/18/2021   GLUCOSE 160 (H) 05/18/2021   BUN 10 05/18/2021   CREATININE 0.60 05/18/2021   CALCIUM 9.5 05/18/2021   GFRNONAA >60 05/18/2021   GFRAA >60 05/08/2020   Lipid Panel: No results found for: LDLCALC HgbA1c: No results found for: HGBA1C Urine Drug Screen: No results found for: LABOPIA, COCAINSCRNUR, LABBENZ, AMPHETMU, THCU, LABBARB  Alcohol Level No results found for: [place]  CT Head without contrast: Personally reviewed and CTH was negative for a large hypodensity concerning for a large territory infarct or hyperdensity concerning for an ICH  MRI Brain: pending  rEEG:  pending  Impression   Rhonda Espinoza is a 60 y.o. female with PMH significant for anxiety, HTN, asthma, HLD, alcohol induced pancreatitis who had a new onset GTC seizure at home that lasted 3 mins. Was post ictal when brought in by EMS. Had another GTC in the ED. Endorses drinking about 24Oz of Vodka a day for over a decade. Her neurologic examination is notable for tremulousness, mild weakness on the left.  My suspicion is that her  seizures are probably due to alcohol withdrawal rather than seizures induced from chronic alcohol use. However, her EtOH levels are pending. If the etOH levels are low, would support the diagnosis of withdrawal seizure. If however, the levels are elevated, these might be seizures due to longstanding alcohol use and would be more inclined to start her on AEDs. She appears to be undergoing alcohol withdrawal with tachycardia, tremulousness and hypertension and thus high suspicion for potential withdrawal seizures.  Recommendations  - MRI Brain without contrast for noted mild L sided weakness. This might just be post ictal todds - routine EEG to evaluate for epileptiform discharges - Agree with CIWA protocol - Reviewed LP findings with mildly elevated protein likely reactive from seizure. No pleocytosis. Clinical exam also is not consistent with meningitis or encephalitis. I would not start her on antimicrobial agents at this time. Will need to follow up cultures. ______________________________________________________________________  Plan discussed with Dr. Alcario Drought.  Thank you for the opportunity to take part in the care of this patient. If you have any further questions, please contact the neurology consultation attending.  Signed,  [place] Pager Number [phone] _ _ _   _ __   _ __ _ _  __ __   _ __   __ _

## 2021-05-19 ENCOUNTER — Observation Stay (HOSPITAL_COMMUNITY): Payer: Managed Care, Other (non HMO)

## 2021-05-19 DIAGNOSIS — I10 Essential (primary) hypertension: Secondary | ICD-10-CM | POA: Diagnosis not present

## 2021-05-19 DIAGNOSIS — G9340 Encephalopathy, unspecified: Secondary | ICD-10-CM

## 2021-05-19 DIAGNOSIS — R569 Unspecified convulsions: Secondary | ICD-10-CM | POA: Diagnosis not present

## 2021-05-19 DIAGNOSIS — F1010 Alcohol abuse, uncomplicated: Secondary | ICD-10-CM | POA: Diagnosis not present

## 2021-05-19 LAB — RESP PANEL BY RT-PCR (FLU A&B, COVID) ARPGX2
Influenza A by PCR: NEGATIVE
Influenza B by PCR: NEGATIVE
SARS Coronavirus 2 by RT PCR: NEGATIVE

## 2021-05-19 LAB — CBC
HCT: 26.8 % — ABNORMAL LOW (ref 36.0–46.0)
Hemoglobin: 9.1 g/dL — ABNORMAL LOW (ref 12.0–15.0)
MCH: 34 pg (ref 26.0–34.0)
MCHC: 34 g/dL (ref 30.0–36.0)
MCV: 100 fL (ref 80.0–100.0)
Platelets: 344 10*3/uL (ref 150–400)
RBC: 2.68 MIL/uL — ABNORMAL LOW (ref 3.87–5.11)
RDW: 13.3 % (ref 11.5–15.5)
WBC: 11 10*3/uL — ABNORMAL HIGH (ref 4.0–10.5)
nRBC: 0 % (ref 0.0–0.2)

## 2021-05-19 LAB — HIV ANTIBODY (ROUTINE TESTING W REFLEX): HIV Screen 4th Generation wRfx: NONREACTIVE

## 2021-05-19 LAB — BASIC METABOLIC PANEL
Anion gap: 7 (ref 5–15)
BUN: 10 mg/dL (ref 6–20)
CO2: 27 mmol/L (ref 22–32)
Calcium: 8.4 mg/dL — ABNORMAL LOW (ref 8.9–10.3)
Chloride: 104 mmol/L (ref 98–111)
Creatinine, Ser: 0.64 mg/dL (ref 0.44–1.00)
GFR, Estimated: >60 mL/min (ref >60)
Glucose, Bld: 143 mg/dL — ABNORMAL HIGH (ref 70–99)
Potassium: 3.3 mmol/L — ABNORMAL LOW (ref 3.5–5.1)
Sodium: 138 mmol/L (ref 135–145)

## 2021-05-19 LAB — LACTIC ACID, PLASMA: Lactic Acid, Venous: 1.8 mmol/L (ref 0.5–1.9)

## 2021-05-19 LAB — ETHANOL: Alcohol, Ethyl (B): <10 mg/dL (ref <10)

## 2021-05-19 LAB — PHOSPHORUS: Phosphorus: 3.8 mg/dL (ref 2.5–4.6)

## 2021-05-19 IMAGING — MR MR HEAD W/O CM
16 of 17 series · 42 of 48 positions shown · non-contrast
Comparison: Head CT from yesterday

CLINICAL DATA: Mild left-sided weakness after seizure.

EXAM:
MRI HEAD WITHOUT CONTRAST
TECHNIQUE: Multiplanar, multiecho pulse sequences of the brain and surrounding
structures were obtained without intravenous contrast.

[Series 5: DWI · axial · 3.0mm · 0.88mm/px · z∈[-123,+18]mm · 4 of 96 slices shown (1 of 4)]
[im 1/96]
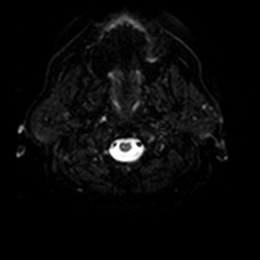
[im 32/96]
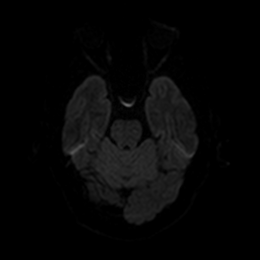
[im 64/96]
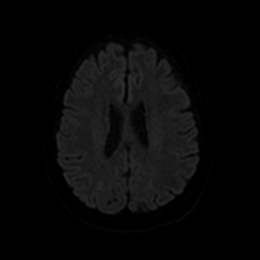
[im 96/96]
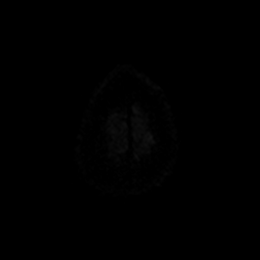

[Series 6: DWI · axial · 3.0mm · 0.88mm/px · 1 of 48 slices shown (2 of 4)]
[im 1/48]
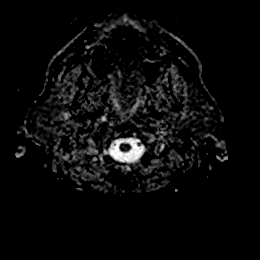

[Series 7: DWI · coronal · 4.0mm · 0.88mm/px · 3 of 64 slices shown (3 of 4)]
[im 1/64]
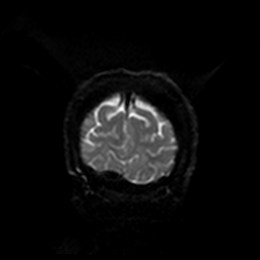
[im 32/64]
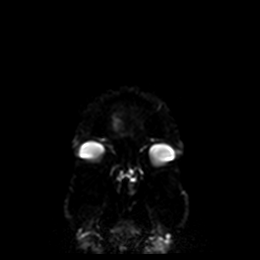
[im 64/64]
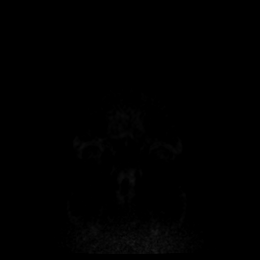

[Series 8: DWI · coronal · 4.0mm · 0.88mm/px · 2 of 32 slices shown (4 of 4)]
[im 1/32]
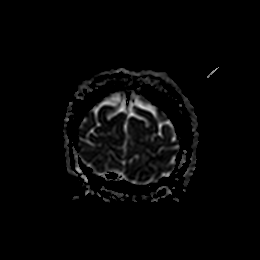
[im 32/32]
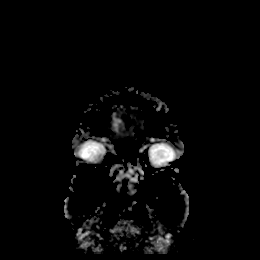

[Series 9: T1 · sagittal · 5.0mm · 0.75mm/px · 1 of 23 slices shown]
[im 1/23]
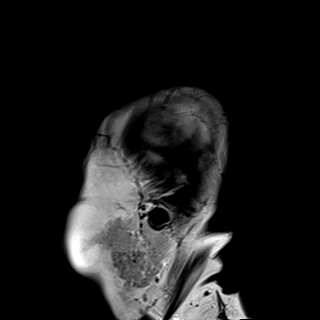

[Series 10: T2 · axial · 5.0mm · 0.72mm/px · 1 of 25 slices shown (1 of 3)]
[im 1/25]
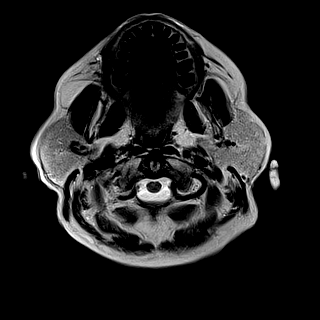

[Series 11: FLAIR · axial · 5.0mm · 0.45mm/px · 1 of 25 slices shown (1 of 2)]
[im 1/25]
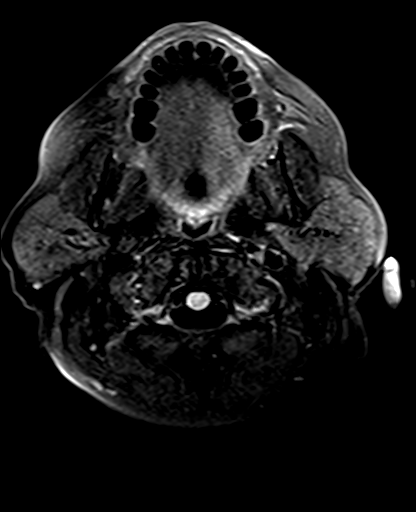

[Series 12: mag_images · axial · 3.0mm · 0.90mm/px · z∈[-139,+38]mm · 3 of 60 slices shown]
[im 1/60]
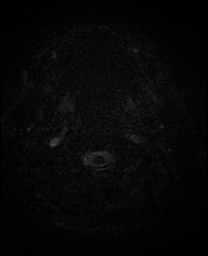
[im 30/60]
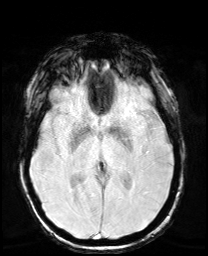
[im 60/60]
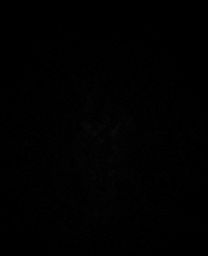

[Series 13: pha_images · axial · 3.0mm · 0.90mm/px · z∈[-136,+38]mm · 3 of 59 slices shown]
[im 1/59]
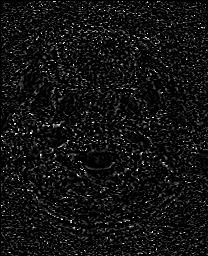
[im 30/59]
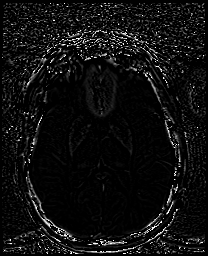
[im 59/59]
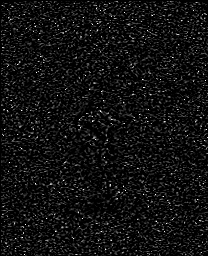

[Series 14: swi_images · axial · 3.0mm · 0.90mm/px · z∈[-139,+38]mm · 3 of 60 slices shown]
[im 1/60]
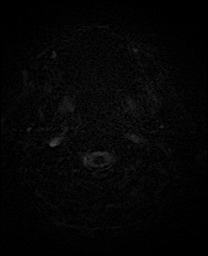
[im 30/60]
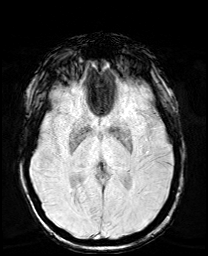
[im 60/60]
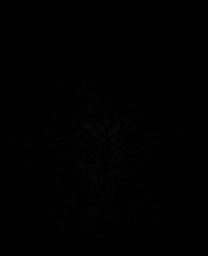

[Series 15: mip_images(sw) · axial · 24.0mm · 0.90mm/px · z∈[-129,+27]mm · 3 of 53 slices shown]
[im 1/53]
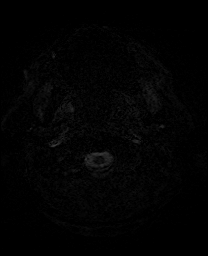
[im 27/53]
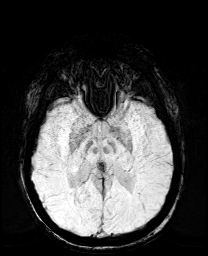
[im 53/53]
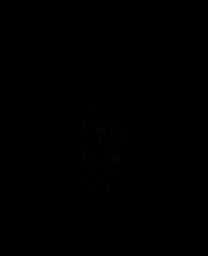

[Series 17: t1_mprage_tra_p2_iso · axial · 1.0mm · 0.98mm/px · z∈[-141,+34]mm · 8 of 175 slices shown]
[im 1/175]
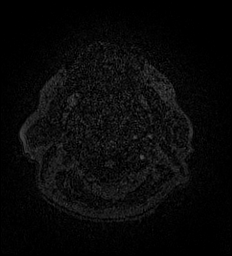
[im 25/175]
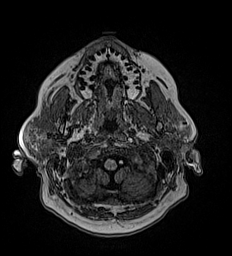
[im 50/175]
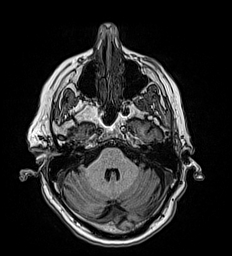
[im 75/175]
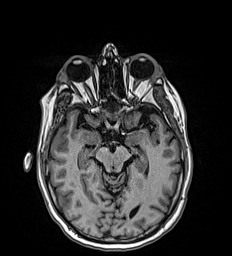
[im 100/175]
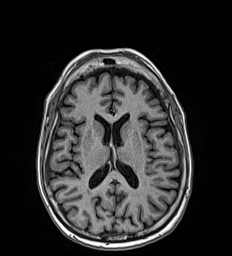
[im 125/175]
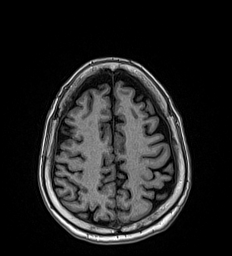
[im 150/175]
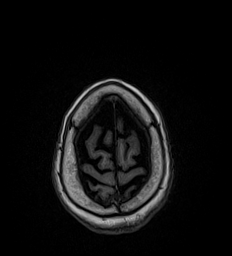
[im 175/175]
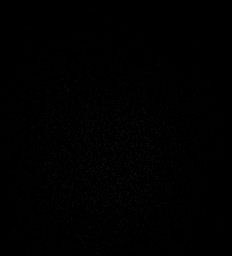

[Series 18: t1_mprage_tra_p2_iso_mpr_coronal · coronal · 1.0mm · 0.45mm/px · 4 of 140 slices shown]
[im 1/140]
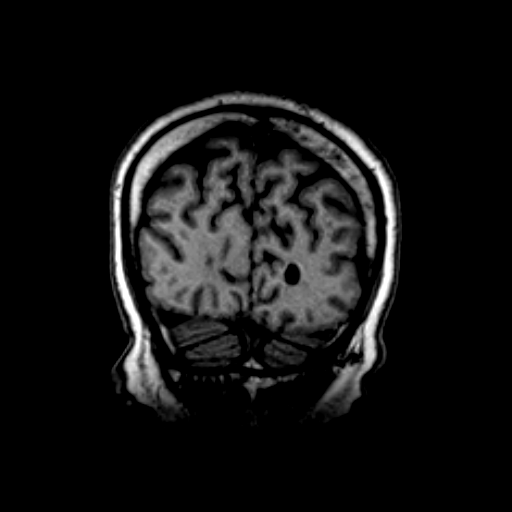
[im 24/140]
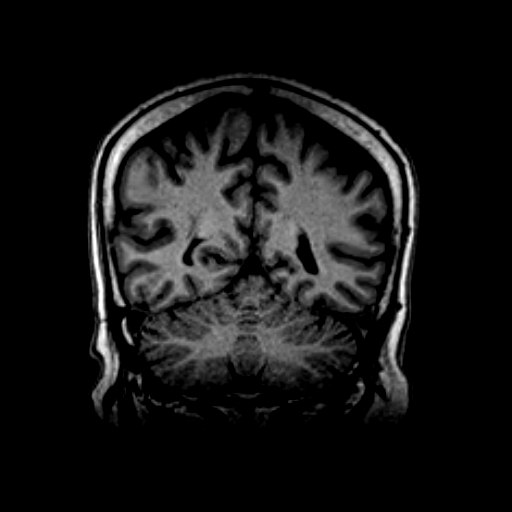
[im 47/140]
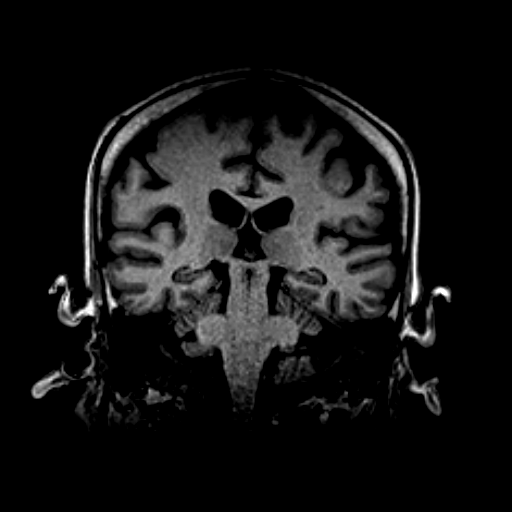
[im 70/140]
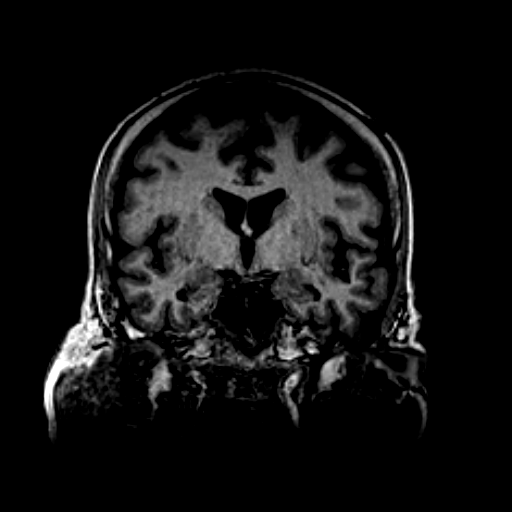

[Series 19: T2 · coronal · 3.0mm · 0.27mm/px · 2 of 32 slices shown (2 of 3)]
[im 1/32]
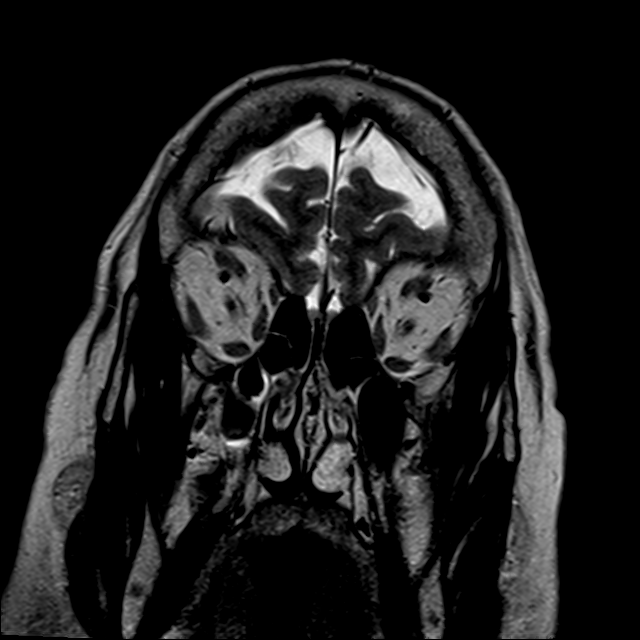
[im 32/32]
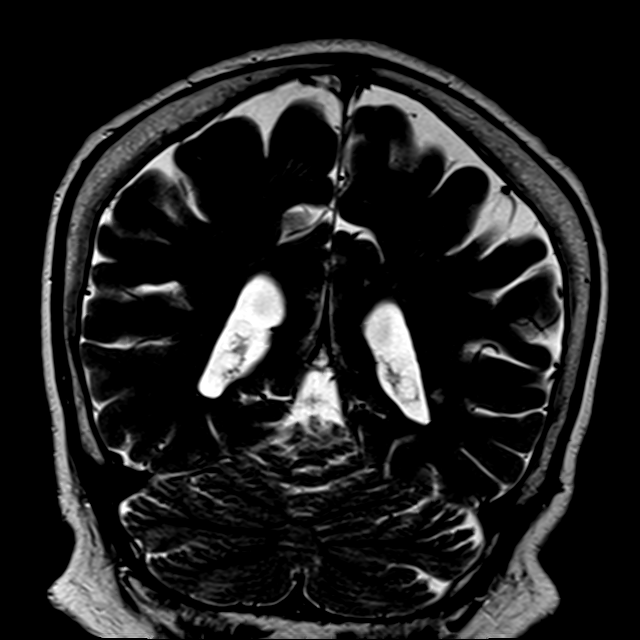

[Series 20: FLAIR · coronal · 3.0mm · 0.56mm/px · 2 of 32 slices shown (2 of 2)]
[im 1/32]
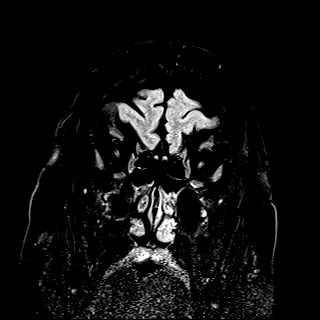
[im 32/32]
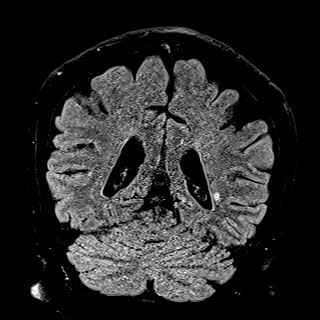

[Series 21: T2 · coronal · 5.0mm · 0.34mm/px · 1 of 29 slices shown (3 of 3)]
[im 1/29]
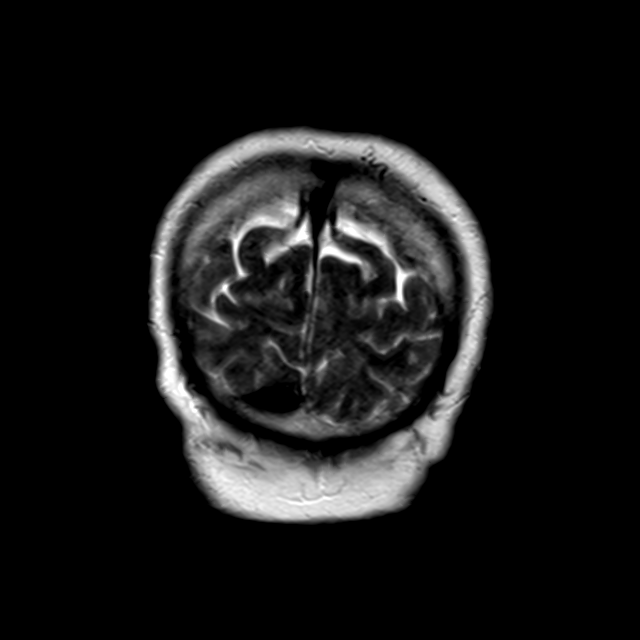

[42 of 48 positions shown; findings below may reference images not displayed]

FINDINGS: Brain: No acute infarction, hemorrhage, hydrocephalus, extra-axial
collection or mass lesion. No cortical finding to correlate with
seizure, including at the bilateral hippocampus. Few remote white
matter insults, usually microvascular ischemia. Brain volume is
normal

Vascular: Normal flow voids.

Skull and upper cervical spine: Normal marrow signal. C3-4 facet
degeneration with spurring and mild anterolisthesis.

Sinuses/Orbits: Negative.
IMPRESSION: Unremarkable brain MRI.  No explanation for weakness or seizure.

## 2021-05-19 MED ORDER — POTASSIUM CHLORIDE CRYS ER 10 MEQ PO TBCR: 10.0000 meq | EXTENDED_RELEASE_TABLET | Freq: Every day | ORAL | 0 refills | Status: DC

## 2021-05-19 MED ORDER — THIAMINE HCL 100 MG PO TABS: 100.0000 mg | ORAL_TABLET | Freq: Every day | ORAL | 0 refills | Status: DC

## 2021-05-19 MED ORDER — FOLIC ACID 1 MG PO TABS
1.0000 mg | ORAL_TABLET | Freq: Every day | ORAL | 0 refills | Status: DC
Start: 2021-05-20 — End: 2024-03-30

## 2021-05-19 MED ORDER — POTASSIUM CHLORIDE CRYS ER 20 MEQ PO TBCR
40.0000 meq | EXTENDED_RELEASE_TABLET | Freq: Once | ORAL | Status: AC
  Administered 2021-05-19: 40 meq via ORAL
  Filled 2021-05-19: qty 2

## 2021-05-19 NOTE — Discharge Summary (Signed)
Discharge Summary  Rhonda Espinoza [id number] DOB: June 30, 1961  PCP: Chesley Noon, MD  Admit date: 05/18/2021 Discharge date: 05/19/2021  Time spent: Less than 30 minutes  Recommendations for Outpatient Follow-up:  Follow-up with neurology for EEG as outpatient Patient is not to drive until she has been cleared by an outpatient neurology  Discharge Diagnoses:  Active Hospital Problems   Diagnosis Date Noted   Alcohol withdrawal seizure ([place]) 05/18/2021   Seizures ([place]) 05/18/2021   Hypokalemia 05/18/2021   Acute encephalopathy 05/18/2021   ETOH abuse 05/18/2021   Chronic pain syndrome 08/05/2018   Hypertension, essential 05/22/2016    Resolved Hospital Problems  No resolved problems to display.    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  She was attempting to quit drinking alcohol as she drinks about 24 ounces of vodka a day  Hospital Course:  Principal Problem:   Alcohol withdrawal seizure ([place]) Active Problems:   Hypertension, essential   Chronic pain syndrome   Seizures ([facility])   Hypokalemia   Acute encephalopathy   ETOH abuse  Patient was seen and evaluated by neurology they feel that this is alcohol withdrawal seizure and did not feel the need to start her on antiepileptic.  She did not have any more seizures since admission. She is to follow-up with outpatient neurology for EEG as this could not be done while in the hospital and patient really wanted to go home.  She is not to drive until released by outpatient neurologist  Hypertension patient's blood pressure was slightly uncontrolled  she was restarted on her blood pressure medicine when she was able to take p.o.  Hypokalemia secondary to likely use of hydrochlorothiazide she had to receive several doses of potassium for replacement.  I started her on 3-day of potassium to follow-up with her family doctor to see if she needs to continue on hydrochlorothiazide or adding a more chronic use for potassium  Procedures: None  Consultations: Neurology  Discharge Exam: BP (!) 152/99 (BP Location: Left Arm)   Pulse 92   Temp 98.4 F (36.9 C) (Oral)   Resp 13   Ht '5\' 8"'$  (1.727 m)   Wt 90.7 kg   LMP 06/30/2011   SpO2 93%   BMI 30.41 kg/m   General: Alert and oriented x3 obese no distress Cardiovascular: Regular rate and rhythm no murmur Respiratory: Clear to auscultation bilaterally  Discharge Instructions You were cared for by a hospitalist during your hospital stay. If you have any questions about your discharge medications or the care you received while you were in the hospital after you are discharged, you can call the unit and asked to speak with the hospitalist on call if the hospitalist that took care of you is not available. Once you are discharged, your primary care physician will handle any further medical issues. Please note that NO REFILLS for any discharge medications will be authorized once you are discharged, as it is imperative that you return to your primary care physician (or establish a relationship with a primary care physician if you do not have one) for your  aftercare needs so that they can reassess your need for medications and monitor your lab values.  Discharge Instructions     Diet - low sodium heart healthy   Complete by: As directed    Discharge instructions   Complete by: As directed    Follow-up with outpatient neurology for EEG( please schedule with neurology)  Advised her to not drive until released by outpatient neurologist. Follow-up with her primary care provider in 3 to 7 days to  recheck potassium level and continue potassium replacement if needed   Increase activity slowly   Complete by: As directed       Allergies as of 05/19/2021       Reactions   Latex Other (See Comments)   "moreso like bandages"- these cause blisters   Wound Dressings Other (See Comments)   "moreso like bandages"- these cause blisters        Medication List     STOP taking these medications    methocarbamol 500 MG tablet Commonly known as: Robaxin       TAKE these medications    acetaminophen 500 MG tablet Commonly known as: TYLENOL Take 1,000 mg by mouth every 8 (eight) hours as needed for mild pain or headache.   ALPRAZolam 1 MG tablet Commonly known as: XANAX Take 2 mg by mouth at bedtime.   aspirin EC 81 MG tablet Take 81 mg by mouth in the morning. Swallow whole.   atorvastatin 40 MG tablet Commonly known as: LIPITOR Take 40 mg by mouth daily.   desvenlafaxine 100 MG 24 hr tablet Commonly known as: PRISTIQ Take 100 mg by mouth daily.   fenofibrate 160 MG tablet Take 160 mg by mouth daily.   fexofenadine 180 MG tablet Commonly known as: ALLEGRA Take 180 mg by mouth daily as needed for allergies or rhinitis.   folic acid 1 MG tablet Commonly known as: FOLVITE Take 1 tablet (1 mg total) by mouth daily. Start taking on: May 20, 2021   omeprazole 40 MG capsule Commonly known as: PRILOSEC Take 40 mg by mouth daily as needed (for reflux).   ondansetron 8 MG disintegrating tablet Commonly known as: ZOFRAN-ODT DISSOLVE 1 TABLET BY MOUTH EVERY 4 HOURS AS NEEDED FOR NAUSEA What changed:  how much to take how to take this when to take this reasons to take this   potassium chloride 10 MEQ tablet Commonly known as: KLOR-CON Take 1 tablet (10 mEq total) by mouth daily.   promethazine 25 MG tablet Commonly known as: PHENERGAN Take 0.5-1 tablets (12.5-25 mg total) by mouth every 8 (eight) hours as needed for nausea.   sucralfate 1 g  tablet Commonly known as: Carafate Take 1 tablet (1 g total) by mouth 3 (three) times daily with meals. What changed:  when to take this reasons to take this   thiamine 100 MG tablet Take 1 tablet (100 mg total) by mouth daily. Start taking on: May 20, 2021   tiZANidine 4 MG tablet Commonly known as: ZANAFLEX Take 4 mg by mouth at bedtime as needed for muscle spasms.   valsartan-hydrochlorothiazide 80-12.5 MG tablet Commonly known as: DIOVAN-HCT Take 1 tablet by mouth daily.   Vitamin D (Ergocalciferol) 1.25 MG (50000 UNIT) Caps capsule Commonly known as: DRISDOL Take 50,000 Units by mouth every Sunday.   zolpidem 12.5 MG CR tablet Commonly known as: AMBIEN CR Take 12.5 mg by mouth at bedtime.       Allergies  Allergen Reactions   Latex Other (See  Comments)    "moreso like bandages"- these cause blisters   Wound Dressings Other (See Comments)    "moreso like bandages"- these cause blisters      The results of significant diagnostics from this hospitalization (including imaging, microbiology, ancillary and laboratory) are listed below for reference.    Significant Diagnostic Studies: CT HEAD WO CONTRAST (5MM)  Result Date: 05/18/2021 CLINICAL DATA:  Seizure EXAM: CT HEAD WITHOUT CONTRAST TECHNIQUE: Contiguous axial images were obtained from the base of the skull through the vertex without intravenous contrast. COMPARISON:  None. FINDINGS: Brain: Normal anatomic configuration. No abnormal intra or extra-axial mass lesion or fluid collection. No abnormal mass effect or midline shift. No evidence of acute intracranial hemorrhage or infarct. Ventricular size is normal. Cerebellum unremarkable. Vascular: Unremarkable Skull: Intact Sinuses/Orbits: Paranasal sinuses are clear. Orbits are unremarkable. Other: Mastoid air cells and middle ear cavities are clear. IMPRESSION: No acute intracranial abnormality.  Unremarkable examination. Electronically Signed   By: Fidela Salisbury  M.D.   On: 05/18/2021 18:56   MR BRAIN WO CONTRAST  Result Date: 05/19/2021 CLINICAL DATA:  Mild left-sided weakness after seizure. EXAM: MRI HEAD WITHOUT CONTRAST TECHNIQUE: Multiplanar, multiecho pulse sequences of the brain and surrounding structures were obtained without intravenous contrast. COMPARISON:  Head CT from yesterday FINDINGS: Brain: No acute infarction, hemorrhage, hydrocephalus, extra-axial collection or mass lesion. No cortical finding to correlate with seizure, including at the bilateral hippocampus. Few remote white matter insults, usually microvascular ischemia. Brain volume is normal Vascular: Normal flow voids. Skull and upper cervical spine: Normal marrow signal. C3-4 facet degeneration with spurring and mild anterolisthesis. Sinuses/Orbits: Negative. IMPRESSION: Unremarkable brain MRI.  No explanation for weakness or seizure. Electronically Signed   By: Monte Fantasia M.D.   On: 05/19/2021 06:14   DG Chest Portable 1 View  Result Date: 05/18/2021 CLINICAL DATA:  Seizure. EXAM: PORTABLE CHEST 1 VIEW COMPARISON:  03/17/2019 FINDINGS: Low lung volumes. Heart size is normal accounting for low lung volumes. 9 there is subsegmental atelectasis at the RIGHT lung base. No pulmonary edema or consolidation. IMPRESSION: RIGHT LOWER lobe atelectasis. Electronically Signed   By: Nolon Nations M.D.   On: 05/18/2021 17:16    Microbiology: Recent Results (from the past 240 hour(s))  Resp Panel by RT-PCR (Flu A&B, Covid) Nasopharyngeal Swab     Status: None   Collection Time: 05/18/21  5:27 PM   Specimen: Nasopharyngeal Swab; Nasopharyngeal(NP) swabs in vial transport medium  Result Value Ref Range Status   SARS Coronavirus 2 by RT PCR NEGATIVE NEGATIVE Final    Comment: (NOTE) SARS-CoV-2 target nucleic acids are NOT DETECTED.  The SARS-CoV-2 RNA is generally detectable in upper respiratory specimens during the acute phase of infection. The lowest concentration of SARS-CoV-2 viral  copies this assay can detect is 138 copies/mL. A negative result does not preclude SARS-Cov-2 infection and should not be used as the sole basis for treatment or other patient management decisions. A negative result may occur with  improper specimen collection/handling, submission of specimen other than nasopharyngeal swab, presence of viral mutation(s) within the areas targeted by this assay, and inadequate number of viral copies(<138 copies/mL). A negative result must be combined with clinical observations, patient history, and epidemiological information. The expected result is Negative.  Fact Sheet for Patients:  EntrepreneurPulse.com.au  Fact Sheet for Healthcare Providers:  IncredibleEmployment.be  This test is no t yet approved or cleared by the [place] FDA and  has been authorized for detection and/or diagnosis of SARS-CoV-2  by FDA under an Emergency Use Authorization (EUA). This EUA will remain  in effect (meaning this test can be used) for the duration of the COVID-19 declaration under Section 564(b)(1) of the Act, 21 U.S.C.section 360bbb-3(b)(1), unless the authorization is terminated  or revoked sooner.       Influenza A by PCR NEGATIVE NEGATIVE Final   Influenza B by PCR NEGATIVE NEGATIVE Final    Comment: (NOTE) The Xpert Xpress SARS-CoV-2/FLU/RSV plus assay is intended as an aid in the diagnosis of influenza from Nasopharyngeal swab specimens and should not be used as a sole basis for treatment. Nasal washings and aspirates are unacceptable for Xpert Xpress SARS-CoV-2/FLU/RSV testing.  Fact Sheet for Patients: EntrepreneurPulse.com.au  Fact Sheet for Healthcare Providers: IncredibleEmployment.be  This test is not yet approved or cleared by the [place] FDA and has been authorized for detection and/or diagnosis of SARS-CoV-2 by FDA under an Emergency Use Authorization (EUA). This  EUA will remain in effect (meaning this test can be used) for the duration of the COVID-19 declaration under Section 564(b)(1) of the Act, 21 U.S.C. section 360bbb-3(b)(1), unless the authorization is terminated or revoked.  Performed at [facility], [place] [address], [place], [place] [address]   CSF culture w Gram Stain     Status: None (Preliminary result)   Collection Time: 05/18/21  8:33 PM   Specimen: CSF; Cerebrospinal Fluid  Result Value Ref Range Status   Specimen Description CSF  Final   Special Requests NONE  Final   Gram Stain   Final    CYTOSPIN SMEAR WBC PRESENT,BOTH PMN AND MONONUCLEAR NO ORGANISMS SEEN    Culture   Final    NO GROWTH < 12 HOURS Performed at [facility], [place] [address] [place], [place] [address]    Report Status PENDING  Incomplete     Labs: Basic Metabolic Panel: Recent Labs  Lab 05/18/21 1700 05/18/21 1713 05/18/21 2231 05/19/21 0022  NA 142 141  --  138  K 3.0* 3.0*  --  3.3*  CL 102 105  --  104  CO2 16*  --   --  27  GLUCOSE 162* 160*  --  143*  BUN 10 10  --  10  CREATININE 0.82 0.60  --  0.64  CALCIUM 9.5  --   --  8.4*  MG  --   --  1.9  --   PHOS  --   --   --  3.8   Liver Function Tests: Recent Labs  Lab 05/18/21 1700  AST 34  ALT 24  ALKPHOS 95  BILITOT 0.7  PROT 7.0  ALBUMIN 3.9   Recent Labs  Lab 05/18/21 2231  LIPASE 26   No results for input(s): AMMONIA in the last 168 hours. CBC: Recent Labs  Lab 05/18/21 1700 05/18/21 1713 05/19/21 0022  WBC 11.9*  --  11.0*  NEUTROABS 6.9  --   --   HGB 12.4 12.2 9.1*  HCT 37.9 36.0 26.8*  MCV 105.3*  --  100.0  PLT 482*  --  344   Cardiac Enzymes: No results for input(s): CKTOTAL, CKMB, CKMBINDEX, TROPONINI in the last 168 hours. BNP: BNP (last 3 results) No results for input(s): BNP in the last 8760 hours.  ProBNP (last 3 results) No results for input(s): PROBNP in the last 8760 hours.  CBG: Recent Labs  Lab 05/18/21 1653  GLUCAP  157*       Signed:  Cristal Deer,  MD [facility] 05/19/2021, 4:52 PM

## 2021-05-19 NOTE — Plan of Care (Signed)
Discharged home with family, self care; to follow up with Neurology as outpatient.  Problem: Health Behavior/Discharge Planning: Goal: Ability to manage health-related needs will improve Outcome: Adequate for Discharge   Problem: Coping: Goal: Level of anxiety will decrease Outcome: Adequate for Discharge   Problem: Education: Goal: Knowledge of General Education information will improve Description: Including pain rating scale, medication(s)/side effects and non-pharmacologic comfort measures Outcome: Completed/Met   Problem: Clinical Measurements: Goal: Ability to maintain clinical measurements within normal limits will improve Outcome: Completed/Met Goal: Will remain free from infection Outcome: Completed/Met Goal: Diagnostic test results will improve Outcome: Completed/Met Goal: Respiratory complications will improve Outcome: Completed/Met Goal: Cardiovascular complication will be avoided Outcome: Completed/Met   Problem: Activity: Goal: Risk for activity intolerance will decrease Outcome: Completed/Met   Problem: Nutrition: Goal: Adequate nutrition will be maintained Outcome: Completed/Met   Problem: Elimination: Goal: Will not experience complications related to bowel motility Outcome: Completed/Met Goal: Will not experience complications related to urinary retention Outcome: Completed/Met   Problem: Pain Managment: Goal: General experience of comfort will improve Outcome: Completed/Met   Problem: Safety: Goal: Ability to remain free from injury will improve Outcome: Completed/Met   Problem: Skin Integrity: Goal: Risk for impaired skin integrity will decrease Outcome: Completed/Met

## 2021-05-19 NOTE — Progress Notes (Signed)
Subjective: No further seizures.   Exam: Vitals:   05/19/21 0741 05/19/21 1201  BP: 139/72 136/63  Pulse: 77 91  Resp: 15 19  Temp: 97.9 F (36.6 C) 97.6 F (36.4 C)  SpO2: 95% 95%   Gen: In bed, NAD Resp: non-labored breathing, no acute distress Abd: soft, nt  Neuro: MS: Awake, alert, oriented CN: Pupils reactive bilaterally, EOMI, visual fields full Motor: Moves all extremities well Sensory: Intact to light touch  Pertinent Labs: Creatinine 0.64  Impression: 60 year old female with seizure in the setting of recent alcohol reduction, very much consistent with alcohol withdrawal seizure.  She states that she has been trying to cut back.  I counseled her to wean herself down little more gradually.  Alternatively could provide her with a couple of days worth of benzodiazepines to help raise her seizure threshold.  Due to other EEG needs with limited staffing, I do not think the EEG can get done in a timely fashion but do not think she needs to remain hospitalized for this.  She can have this done as an outpatient at a later date.  Recommendations: 1) no antiepileptics needed at this time 2) she can follow-up with outpatient neurology. 3) would advise her to not drive until released by outpatient neurologist.  Roland Rack, MD [facility] [phone]  If 7pm- 7am, please page neurology on call as listed in [place].

## 2021-05-20 ENCOUNTER — Telehealth: Payer: Self-pay

## 2021-05-20 LAB — HSV 1/2 PCR, CSF
HSV-1 DNA: NEGATIVE
HSV-2 DNA: NEGATIVE

## 2021-05-20 NOTE — Telephone Encounter (Signed)
Patient called regarding referral to a neurology appointment. Per hospital discharge pt is to follow up with "[facility]". No phone number or other information given in order to make this appointment. Pt referred to PCP for information regarding neurology consult.

## 2021-05-22 LAB — CSF CULTURE W GRAM STAIN: Culture: NO GROWTH

## 2021-05-29 ENCOUNTER — Inpatient Hospital Stay (HOSPITAL_COMMUNITY)
Admission: EM | Admit: 2021-05-29 | Discharge: 2021-05-30 | DRG: 440 | Disposition: A | Discharge status: Home / Self Care | Payer: Managed Care, Other (non HMO) | Attending: Family Medicine | Admitting: Family Medicine

## 2021-05-29 ENCOUNTER — Emergency Department (HOSPITAL_COMMUNITY): Payer: Managed Care, Other (non HMO)

## 2021-05-29 ENCOUNTER — Other Ambulatory Visit: Payer: Self-pay

## 2021-05-29 ENCOUNTER — Encounter (HOSPITAL_COMMUNITY): Payer: Self-pay | Admitting: Internal Medicine

## 2021-05-29 DIAGNOSIS — R569 Unspecified convulsions: Secondary | ICD-10-CM | POA: Diagnosis present

## 2021-05-29 DIAGNOSIS — E66811 Obesity, class 1: Secondary | ICD-10-CM

## 2021-05-29 DIAGNOSIS — F39 Unspecified mood [affective] disorder: Secondary | ICD-10-CM | POA: Diagnosis present

## 2021-05-29 DIAGNOSIS — G894 Chronic pain syndrome: Secondary | ICD-10-CM | POA: Diagnosis present

## 2021-05-29 DIAGNOSIS — E785 Hyperlipidemia, unspecified: Secondary | ICD-10-CM | POA: Diagnosis present

## 2021-05-29 DIAGNOSIS — J45909 Unspecified asthma, uncomplicated: Secondary | ICD-10-CM | POA: Diagnosis present

## 2021-05-29 DIAGNOSIS — Z91040 Latex allergy status: Secondary | ICD-10-CM | POA: Diagnosis not present

## 2021-05-29 DIAGNOSIS — Z6833 Body mass index (BMI) 33.0-33.9, adult: Secondary | ICD-10-CM | POA: Diagnosis not present

## 2021-05-29 DIAGNOSIS — M48061 Spinal stenosis, lumbar region without neurogenic claudication: Secondary | ICD-10-CM | POA: Diagnosis present

## 2021-05-29 DIAGNOSIS — K852 Alcohol induced acute pancreatitis without necrosis or infection: Secondary | ICD-10-CM | POA: Diagnosis not present

## 2021-05-29 DIAGNOSIS — Z20822 Contact with and (suspected) exposure to covid-19: Secondary | ICD-10-CM | POA: Diagnosis present

## 2021-05-29 DIAGNOSIS — F10239 Alcohol dependence with withdrawal, unspecified: Secondary | ICD-10-CM

## 2021-05-29 DIAGNOSIS — F10939 Alcohol use, unspecified with withdrawal, unspecified: Secondary | ICD-10-CM

## 2021-05-29 DIAGNOSIS — F988 Other specified behavioral and emotional disorders with onset usually occurring in childhood and adolescence: Secondary | ICD-10-CM | POA: Diagnosis present

## 2021-05-29 DIAGNOSIS — F419 Anxiety disorder, unspecified: Secondary | ICD-10-CM | POA: Diagnosis present

## 2021-05-29 DIAGNOSIS — Z7982 Long term (current) use of aspirin: Secondary | ICD-10-CM | POA: Diagnosis not present

## 2021-05-29 DIAGNOSIS — E669 Obesity, unspecified: Secondary | ICD-10-CM | POA: Diagnosis present

## 2021-05-29 DIAGNOSIS — D75839 Thrombocytosis, unspecified: Secondary | ICD-10-CM | POA: Diagnosis present

## 2021-05-29 DIAGNOSIS — Z9049 Acquired absence of other specified parts of digestive tract: Secondary | ICD-10-CM

## 2021-05-29 DIAGNOSIS — F10230 Alcohol dependence with withdrawal, uncomplicated: Secondary | ICD-10-CM

## 2021-05-29 DIAGNOSIS — K859 Acute pancreatitis without necrosis or infection, unspecified: Secondary | ICD-10-CM

## 2021-05-29 DIAGNOSIS — Z79899 Other long term (current) drug therapy: Secondary | ICD-10-CM | POA: Diagnosis not present

## 2021-05-29 DIAGNOSIS — I10 Essential (primary) hypertension: Secondary | ICD-10-CM | POA: Diagnosis present

## 2021-05-29 DIAGNOSIS — Z91048 Other nonmedicinal substance allergy status: Secondary | ICD-10-CM

## 2021-05-29 HISTORY — DX: Unspecified convulsions: R56.9

## 2021-05-29 HISTORY — DX: Alcohol use, unspecified with withdrawal, unspecified: F10.939

## 2021-05-29 LAB — URINALYSIS, ROUTINE W REFLEX MICROSCOPIC
Bilirubin Urine: NEGATIVE
Glucose, UA: 50 mg/dL — AB
Ketones, ur: 20 mg/dL — AB
Nitrite: NEGATIVE
Protein, ur: NEGATIVE mg/dL
Specific Gravity, Urine: 1.008 (ref 1.005–1.030)
pH: 5 (ref 5.0–8.0)

## 2021-05-29 LAB — CBC WITH DIFFERENTIAL/PLATELET
Abs Immature Granulocytes: 0.08 10*3/uL — ABNORMAL HIGH (ref 0.00–0.07)
Basophils Absolute: 0 10*3/uL (ref 0.0–0.1)
Basophils Relative: 0 %
Eosinophils Absolute: 0 10*3/uL (ref 0.0–0.5)
Eosinophils Relative: 0 %
HCT: 34.1 % — ABNORMAL LOW (ref 36.0–46.0)
Hemoglobin: 11.9 g/dL — ABNORMAL LOW (ref 12.0–15.0)
Immature Granulocytes: 1 %
Lymphocytes Relative: 10 %
Lymphs Abs: 1.4 10*3/uL (ref 0.7–4.0)
MCH: 34.4 pg — ABNORMAL HIGH (ref 26.0–34.0)
MCHC: 34.9 g/dL (ref 30.0–36.0)
MCV: 98.6 fL (ref 80.0–100.0)
Monocytes Absolute: 0.7 10*3/uL (ref 0.1–1.0)
Monocytes Relative: 6 %
Neutro Abs: 10.9 10*3/uL — ABNORMAL HIGH (ref 1.7–7.7)
Neutrophils Relative %: 83 %
Platelets: 578 10*3/uL — ABNORMAL HIGH (ref 150–400)
RBC: 3.46 MIL/uL — ABNORMAL LOW (ref 3.87–5.11)
RDW: 12.6 % (ref 11.5–15.5)
WBC: 13.1 10*3/uL — ABNORMAL HIGH (ref 4.0–10.5)
nRBC: 0 % (ref 0.0–0.2)

## 2021-05-29 LAB — MAGNESIUM: Magnesium: 1.4 mg/dL — ABNORMAL LOW (ref 1.7–2.4)

## 2021-05-29 LAB — COMPREHENSIVE METABOLIC PANEL
ALT: 13 U/L (ref 0–44)
AST: 17 U/L (ref 15–41)
Albumin: 4.2 g/dL (ref 3.5–5.0)
Alkaline Phosphatase: 109 U/L (ref 38–126)
Anion gap: 16 — ABNORMAL HIGH (ref 5–15)
BUN: 14 mg/dL (ref 6–20)
CO2: 22 mmol/L (ref 22–32)
Calcium: 9.5 mg/dL (ref 8.9–10.3)
Chloride: 88 mmol/L — ABNORMAL LOW (ref 98–111)
Creatinine, Ser: 0.91 mg/dL (ref 0.44–1.00)
GFR, Estimated: >60 mL/min (ref >60)
Glucose, Bld: 124 mg/dL — ABNORMAL HIGH (ref 70–99)
Potassium: 3.7 mmol/L (ref 3.5–5.1)
Sodium: 126 mmol/L — ABNORMAL LOW (ref 135–145)
Total Bilirubin: 1.2 mg/dL (ref 0.3–1.2)
Total Protein: 7.2 g/dL (ref 6.5–8.1)

## 2021-05-29 LAB — LIPASE, BLOOD: Lipase: 256 U/L — ABNORMAL HIGH (ref 11–51)

## 2021-05-29 LAB — SARS CORONAVIRUS 2 (TAT 6-24 HRS): SARS Coronavirus 2: NEGATIVE

## 2021-05-29 IMAGING — CT CT ABD-PELV W/ CM
2 of 5 series · 17 of 46 positions shown, 19 images · IV contrast (Omni 300)
Comparison: [DATE]

CLINICAL DATA: Diverticulitis suspected

EXAM:
CT ABDOMEN AND PELVIS WITH CONTRAST
TECHNIQUE: Multidetector CT imaging of the abdomen and pelvis was performed
using the standard protocol following bolus administration of
intravenous contrast.
CONTRAST:  80mL OMNIPAQUE IOHEXOL 350 MG/ML SOLN

[Series 3: a/p w/ 5mm · axial · 0.98mm/px · z∈[-1027,-592]mm · 14 of 97 slices shown, 16 images]
[im 5/97  soft-tissue]
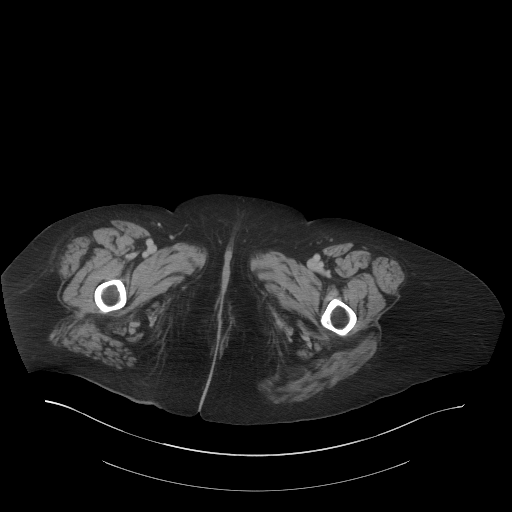
[im 5/97  bone]
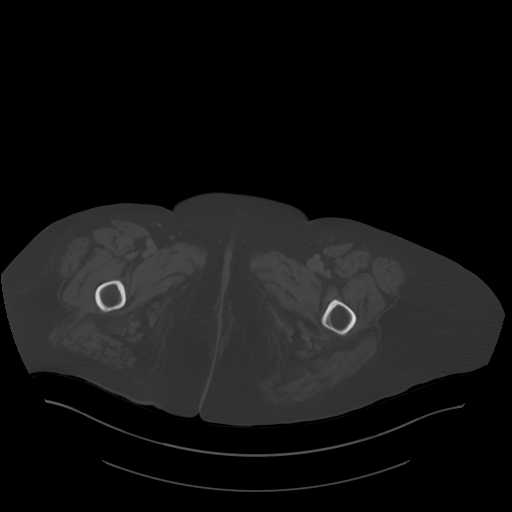
[im 15/97  soft-tissue]
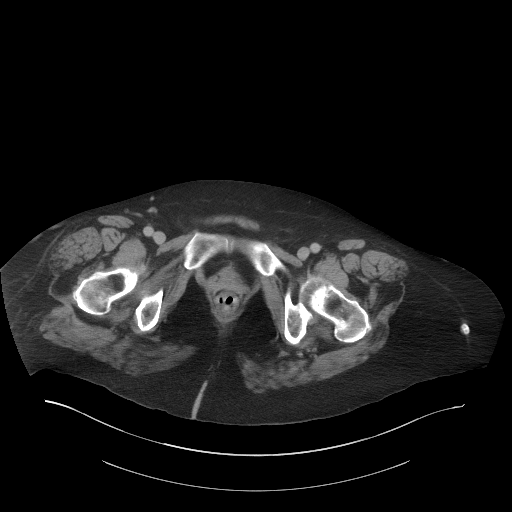
[im 20/97  soft-tissue]
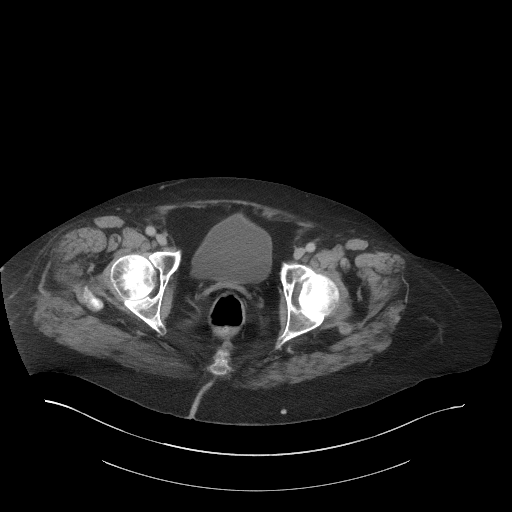
[im 25/97  soft-tissue]
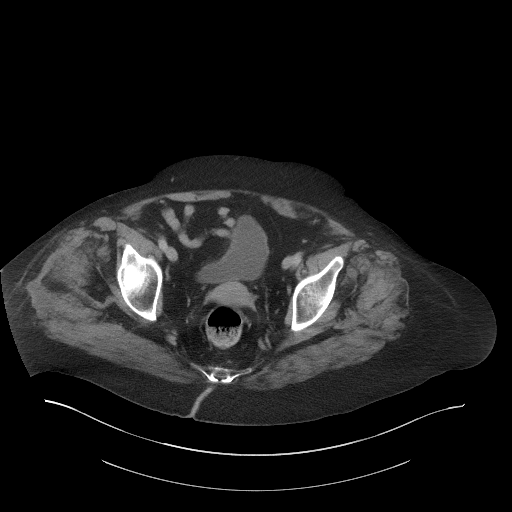
[im 34/97  soft-tissue]
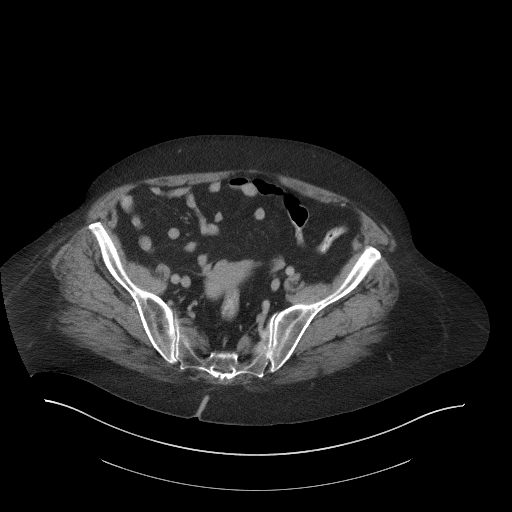
[im 39/97  soft-tissue]
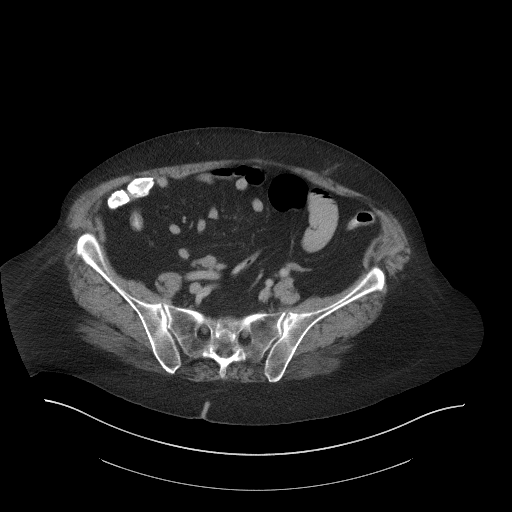
[im 44/97  soft-tissue]
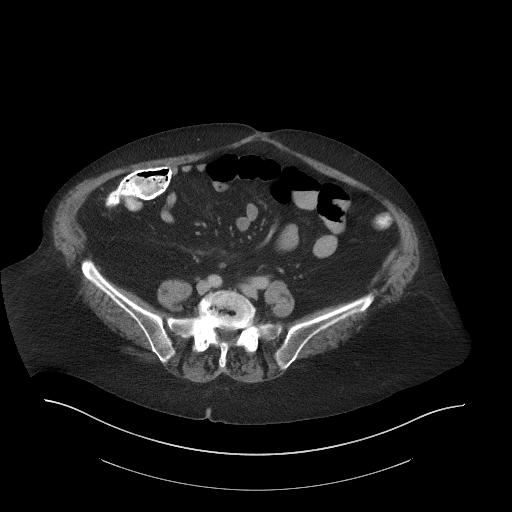
[im 53/97  soft-tissue]
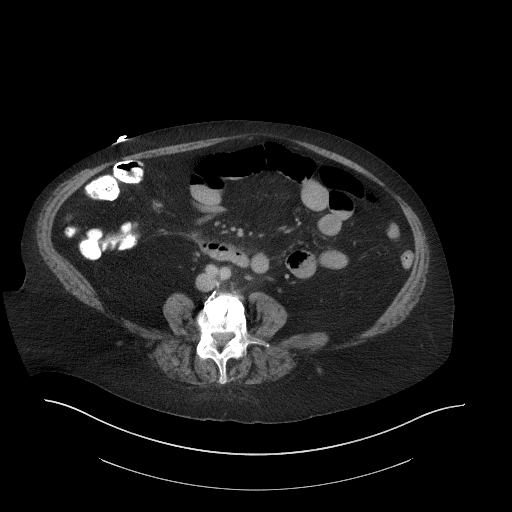
[im 58/97  soft-tissue]
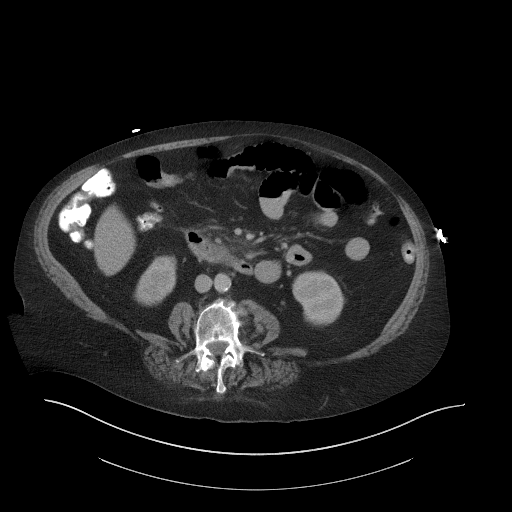
[im 58/97  bone]
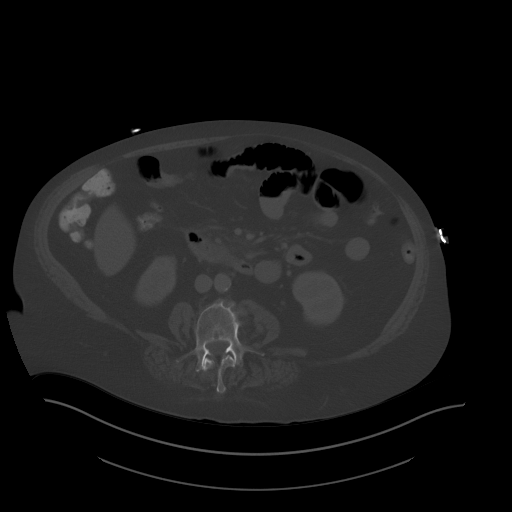
[im 63/97  soft-tissue]
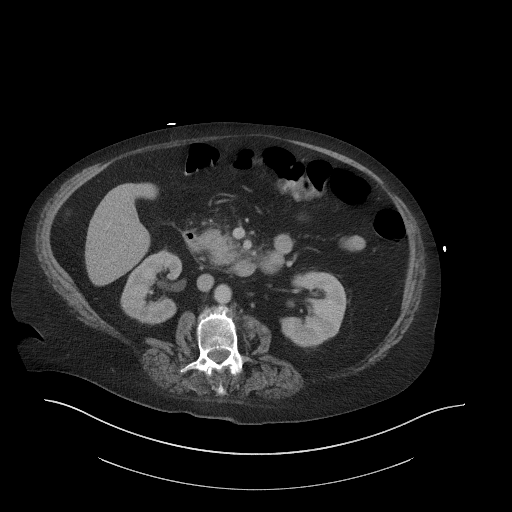
[im 73/97  soft-tissue]
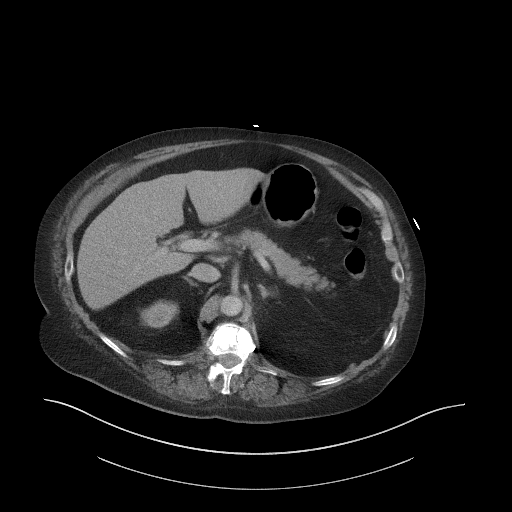
[im 77/97  soft-tissue]
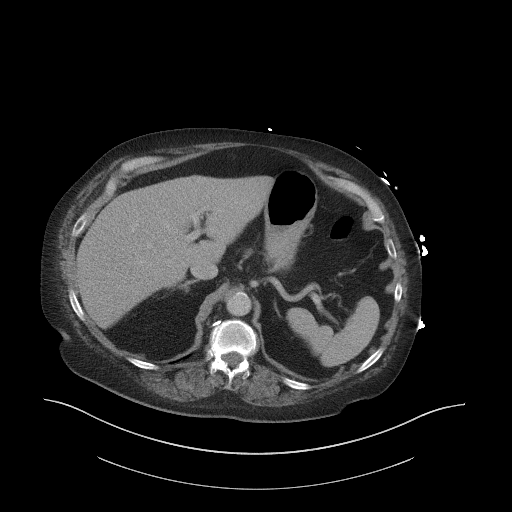
[im 82/97  soft-tissue]
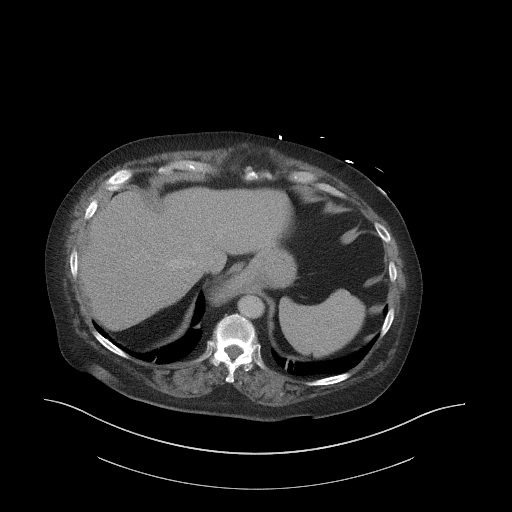
[im 92/97  soft-tissue]
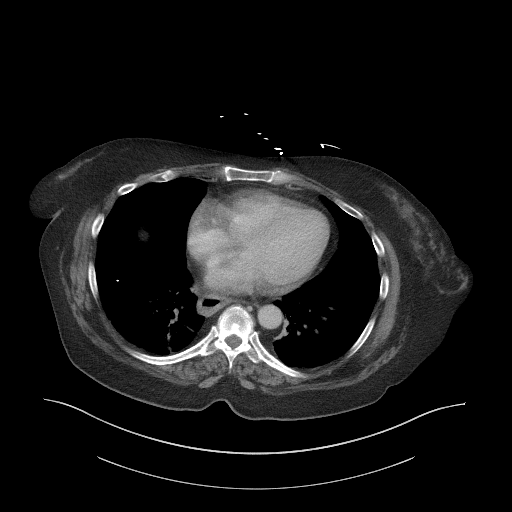

[Series 6: a/p w/ cor · coronal · 0.97mm/px · 3 of 151 slices shown]
[im 51/151  soft-tissue]
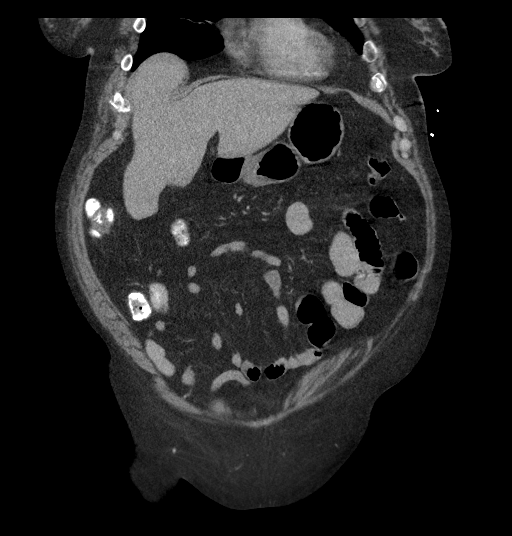
[im 67/151  soft-tissue]
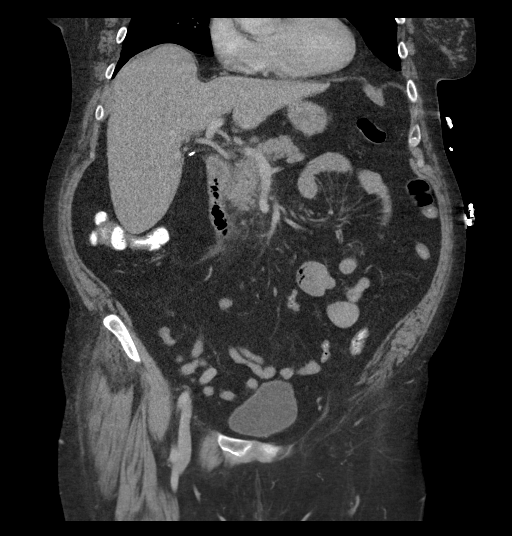
[im 84/151  soft-tissue]
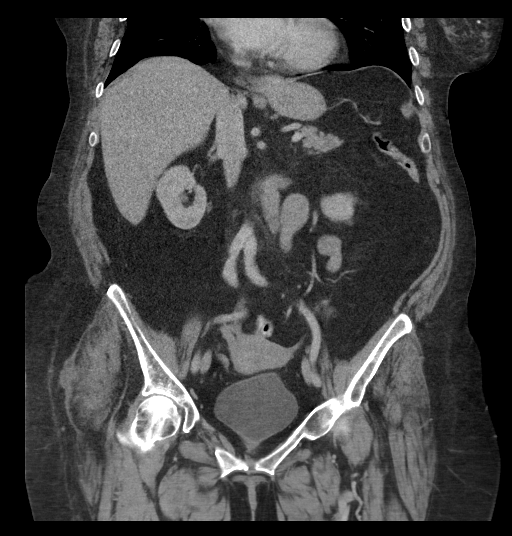

[17 of 46 positions shown; findings below may reference images not displayed]

FINDINGS: Lower chest: Small sliding hiatal hernia. Patulous lower esophagus.
Atelectasis at the lung bases.

Hepatobiliary: No focal liver abnormality.Cholecystectomy. No bile
duct dilatation

Pancreas: Stranding around the pancreatic head and mid to distal
duodenum. No focal pancreatic mass or ductal dilatation. No necrosis
or collection.

Spleen: Unremarkable.

Adrenals/Urinary Tract: Negative adrenals. No hydronephrosis or
stone. Fatty 18 mm mass at the upper pole right kidney consistent
with angiomyolipoma. Unremarkable bladder.

Stomach/Bowel: Inflammation around the duodenum as noted above. No
obstruction.

Vascular/Lymphatic: No acute vascular abnormality. Scattered
atheromatous calcifications. No mass or adenopathy.

Reproductive:4 cm intramural fibroid in the right aspect of the
uterine body.

Other: No ascites or pneumoperitoneum.

Musculoskeletal: No acute abnormalities. Advanced lumbar spine
degeneration with scoliosis. Prior compression fractures and cement
augmentation at T12, L1, and L4.
IMPRESSION: 1. Inflammation at the pancreatic duodenal groove, favor
pancreatitis over duodenitis. No necrosis or collection.
2. Chronic findings are described above.

## 2021-05-29 MED ORDER — ADULT MULTIVITAMIN W/MINERALS CH
1.0000 | ORAL_TABLET | Freq: Every day | ORAL | Status: DC
  Administered 2021-05-29 – 2021-05-30 (×2): 1 via ORAL
  Filled 2021-05-29 (×2): qty 1

## 2021-05-29 MED ORDER — ZOLPIDEM TARTRATE 5 MG PO TABS
5.0000 mg | ORAL_TABLET | Freq: Every evening | ORAL | Status: DC | PRN
  Administered 2021-05-29: 5 mg via ORAL
  Filled 2021-05-29: qty 1

## 2021-05-29 MED ORDER — PANTOPRAZOLE SODIUM 40 MG IV SOLR
40.0000 mg | Freq: Once | INTRAVENOUS | Status: AC
  Administered 2021-05-29: 40 mg via INTRAVENOUS
  Filled 2021-05-29: qty 40

## 2021-05-29 MED ORDER — PROCHLORPERAZINE EDISYLATE 10 MG/2ML IJ SOLN
10.0000 mg | Freq: Once | INTRAMUSCULAR | Status: AC
  Administered 2021-05-29: 10 mg via INTRAVENOUS
  Filled 2021-05-29: qty 2

## 2021-05-29 MED ORDER — THIAMINE HCL 100 MG PO TABS
100.0000 mg | ORAL_TABLET | Freq: Every day | ORAL | Status: DC
  Administered 2021-05-29 – 2021-05-30 (×2): 100 mg via ORAL
  Filled 2021-05-29 (×2): qty 1

## 2021-05-29 MED ORDER — METOCLOPRAMIDE HCL 5 MG/ML IJ SOLN: 10.0000 mg | Freq: Once | INTRAMUSCULAR | Status: DC

## 2021-05-29 MED ORDER — HYDRALAZINE HCL 20 MG/ML IJ SOLN
5.0000 mg | INTRAMUSCULAR | Status: DC | PRN
  Administered 2021-05-29 – 2021-05-30 (×2): 5 mg via INTRAVENOUS
  Filled 2021-05-29 (×2): qty 1

## 2021-05-29 MED ORDER — LORAZEPAM 2 MG/ML IJ SOLN
1.0000 mg | INTRAMUSCULAR | Status: DC | PRN
  Administered 2021-05-29 – 2021-05-30 (×2): 1 mg via INTRAVENOUS
  Filled 2021-05-29 (×2): qty 1

## 2021-05-29 MED ORDER — LACTATED RINGERS IV SOLN: INTRAVENOUS | Status: DC

## 2021-05-29 MED ORDER — ATORVASTATIN CALCIUM 40 MG PO TABS
40.0000 mg | ORAL_TABLET | Freq: Every day | ORAL | Status: DC
  Administered 2021-05-29 – 2021-05-30 (×2): 40 mg via ORAL
  Filled 2021-05-29 (×2): qty 1

## 2021-05-29 MED ORDER — VALSARTAN-HYDROCHLOROTHIAZIDE 80-12.5 MG PO TABS: 1.0000 | ORAL_TABLET | Freq: Every day | ORAL | Status: DC

## 2021-05-29 MED ORDER — ENOXAPARIN SODIUM 40 MG/0.4ML IJ SOSY
40.0000 mg | PREFILLED_SYRINGE | INTRAMUSCULAR | Status: DC
  Administered 2021-05-29: 40 mg via SUBCUTANEOUS
  Filled 2021-05-29 (×2): qty 0.4

## 2021-05-29 MED ORDER — SODIUM CHLORIDE 0.9% FLUSH
3.0000 mL | Freq: Two times a day (BID) | INTRAVENOUS | Status: DC
  Administered 2021-05-29 – 2021-05-30 (×3): 3 mL via INTRAVENOUS

## 2021-05-29 MED ORDER — LORAZEPAM 1 MG PO TABS: 1.0000 mg | ORAL_TABLET | ORAL | Status: DC | PRN

## 2021-05-29 MED ORDER — ONDANSETRON HCL 4 MG/2ML IJ SOLN
4.0000 mg | Freq: Four times a day (QID) | INTRAMUSCULAR | Status: DC | PRN
  Administered 2021-05-29 – 2021-05-30 (×2): 4 mg via INTRAVENOUS
  Filled 2021-05-29 (×2): qty 2

## 2021-05-29 MED ORDER — DIPHENHYDRAMINE HCL 50 MG/ML IJ SOLN
25.0000 mg | Freq: Once | INTRAMUSCULAR | Status: AC
  Administered 2021-05-29: 25 mg via INTRAVENOUS
  Filled 2021-05-29: qty 1

## 2021-05-29 MED ORDER — ACETAMINOPHEN 325 MG PO TABS
650.0000 mg | ORAL_TABLET | Freq: Four times a day (QID) | ORAL | Status: DC | PRN
  Administered 2021-05-29: 650 mg via ORAL
  Filled 2021-05-29: qty 2

## 2021-05-29 MED ORDER — ONDANSETRON HCL 4 MG/2ML IJ SOLN
4.0000 mg | Freq: Once | INTRAMUSCULAR | Status: AC
  Administered 2021-05-29: 4 mg via INTRAVENOUS
  Filled 2021-05-29: qty 2

## 2021-05-29 MED ORDER — PANTOPRAZOLE SODIUM 40 MG PO TBEC
40.0000 mg | DELAYED_RELEASE_TABLET | Freq: Every day | ORAL | Status: DC
  Administered 2021-05-30: 40 mg via ORAL
  Filled 2021-05-29 (×2): qty 1

## 2021-05-29 MED ORDER — MORPHINE SULFATE (PF) 2 MG/ML IV SOLN
2.0000 mg | INTRAVENOUS | Status: DC | PRN
  Administered 2021-05-29: 2 mg via INTRAVENOUS
  Filled 2021-05-29: qty 1

## 2021-05-29 MED ORDER — THIAMINE HCL 100 MG/ML IJ SOLN: 100.0000 mg | Freq: Every day | INTRAMUSCULAR | Status: DC

## 2021-05-29 MED ORDER — IOHEXOL 350 MG/ML SOLN
80.0000 mL | Freq: Once | INTRAVENOUS | Status: AC | PRN
  Administered 2021-05-29: 80 mL via INTRAVENOUS

## 2021-05-29 MED ORDER — VENLAFAXINE HCL ER 150 MG PO CP24
150.0000 mg | ORAL_CAPSULE | Freq: Every day | ORAL | Status: DC
  Administered 2021-05-29 – 2021-05-30 (×2): 150 mg via ORAL
  Filled 2021-05-29 (×2): qty 1

## 2021-05-29 MED ORDER — FOLIC ACID 1 MG PO TABS
1.0000 mg | ORAL_TABLET | Freq: Every day | ORAL | Status: DC
  Administered 2021-05-29 – 2021-05-30 (×2): 1 mg via ORAL
  Filled 2021-05-29 (×2): qty 1

## 2021-05-29 MED ORDER — FENOFIBRATE 160 MG PO TABS
160.0000 mg | ORAL_TABLET | Freq: Every day | ORAL | Status: DC
  Administered 2021-05-29 – 2021-05-30 (×2): 160 mg via ORAL
  Filled 2021-05-29 (×2): qty 1

## 2021-05-29 MED ORDER — HYDROCHLOROTHIAZIDE 12.5 MG PO CAPS
12.5000 mg | ORAL_CAPSULE | Freq: Every day | ORAL | Status: DC
  Administered 2021-05-29 – 2021-05-30 (×2): 12.5 mg via ORAL
  Filled 2021-05-29 (×2): qty 1

## 2021-05-29 MED ORDER — ASPIRIN EC 81 MG PO TBEC
81.0000 mg | DELAYED_RELEASE_TABLET | Freq: Every morning | ORAL | Status: DC
  Administered 2021-05-29 – 2021-05-30 (×2): 81 mg via ORAL
  Filled 2021-05-29 (×2): qty 1

## 2021-05-29 MED ORDER — MORPHINE SULFATE (PF) 4 MG/ML IV SOLN
4.0000 mg | Freq: Once | INTRAVENOUS | Status: AC
  Administered 2021-05-29: 4 mg via INTRAVENOUS
  Filled 2021-05-29: qty 1

## 2021-05-29 MED ORDER — ONDANSETRON HCL 4 MG PO TABS: 4.0000 mg | ORAL_TABLET | Freq: Four times a day (QID) | ORAL | Status: DC | PRN

## 2021-05-29 MED ORDER — LACTATED RINGERS IV BOLUS
1000.0000 mL | Freq: Once | INTRAVENOUS | Status: AC
  Administered 2021-05-29: 1000 mL via INTRAVENOUS

## 2021-05-29 MED ORDER — IRBESARTAN 150 MG PO TABS
75.0000 mg | ORAL_TABLET | Freq: Every day | ORAL | Status: DC
  Administered 2021-05-29 – 2021-05-30 (×2): 75 mg via ORAL
  Filled 2021-05-29 (×2): qty 1

## 2021-05-29 MED ORDER — SUCRALFATE 1 G PO TABS
1.0000 g | ORAL_TABLET | Freq: Three times a day (TID) | ORAL | Status: DC
  Administered 2021-05-29 – 2021-05-30 (×4): 1 g via ORAL
  Filled 2021-05-29 (×7): qty 1

## 2021-05-29 MED ORDER — PANTOPRAZOLE SODIUM 40 MG IV SOLR
40.0000 mg | Freq: Two times a day (BID) | INTRAVENOUS | Status: AC
  Administered 2021-05-29 (×2): 40 mg via INTRAVENOUS
  Filled 2021-05-29 (×2): qty 40

## 2021-05-29 MED ORDER — ACETAMINOPHEN 650 MG RE SUPP: 650.0000 mg | Freq: Four times a day (QID) | RECTAL | Status: DC | PRN

## 2021-05-29 NOTE — ED Notes (Signed)
Pt is attempting to drink contrast but is making her nausous

## 2021-05-29 NOTE — ED Notes (Signed)
Pt care taken, iv nurse is in trying to obtain an iv

## 2021-05-29 NOTE — ED Provider Notes (Signed)
[facility]PART[facility] Provider Note   CSN: [id number] Arrival date & time: 05/29/21  0110     History Chief complaint: Abdominal pain, nausea, vomiting.   Rhonda Espinoza is a 60 y.o. female.  The history is provided by the patient.  She has history of hypertension, hyperlipidemia, attention deficit disorder, alcohol abuse, recent seizure and comes in because of vomiting and upper abdominal pain.  Vomiting started this morning and she has vomited multiple times.  There has been mucus in the emesis but no blood.  She denies constipation or diarrhea.  Abdominal pain is in the epigastric area and she rates it at 8/10.  There is no radiation of pain.  She tried taking oral promethazine, but vomited following taking it.  She is also complaining of a mild bifrontal headache.  Of note, she has not been able to hold down her antihypertensive medication today.   Past Medical History:  Diagnosis Date   Abnormal LFTs    ADD (attention deficit disorder)    Alcohol-induced pancreatitis    Anxiety    Arthritis    Asthma    allergy induced per patient   Disc disorder    Hyperlipidemia    Hypertension    Pneumonia     Patient Active Problem List   Diagnosis Date Noted   Seizures ([place]) 05/18/2021   Hypokalemia 05/18/2021   Acute encephalopathy 05/18/2021   Alcohol withdrawal seizure ([place]) 05/18/2021   ETOH abuse 05/18/2021   Surgery, elective 02/05/2021   History of T12 kyphoplasty 01/24/2019   Hip pain, acute, right 01/16/2019   Acute leg pain, right 01/16/2019   Sciatica of right side without back pain 01/16/2019   Latex precautions, history of latex allergy 11/02/2018   Lesion of right native kidney 08/30/2018   Abnormal MRI, lumbar spine (08/14/2018) 08/23/2018   Lumbar lateral recess stenosis (Left: L2-3) (Bilateral: L3-4) (Right: L4-5) 08/23/2018   Lumbar Grade 1 Retrolisthesis of L2/L3, L3/L4, & L4/L5 08/23/2018   Grade 1 Lumbar Anterolisthesis of  L5/S1 08/23/2018   Lumbar facet hypertrophy (Multilevel) (Bilateral) 08/19/2018   Lumbar foraminal stenosis (Left: L2-3, L3-4) (Bilateral: L4-5, L5-S1) 08/19/2018   Lumbar central spinal stenosis 08/19/2018   DDD (degenerative disc disease), lumbar 08/05/2018   Traumatic compression fracture of T12 (<15%) thoracic vertebra, sequela 08/05/2018   Abnormal x-ray of lumbar spine (08/02/2018) 08/05/2018   Disorder of skeletal system 08/05/2018   Pharmacologic therapy 08/05/2018   Problems influencing health status 08/05/2018   Chronic pain syndrome 08/05/2018   Class 1 obesity with body mass index (BMI) of 33.0 to 33.9 in adult 05/24/2018   Seasonal allergic rhinitis due to pollen 02/03/2017   Chronic low back pain (Bilateral) (R>L) 08/04/2016   Long term current use of opiate analgesic 08/04/2016   Long term prescription opiate use 08/04/2016   Opiate use 08/04/2016   Spondylosis without myelopathy or radiculopathy, lumbar region 08/04/2016   ADD (attention deficit disorder) 05/22/2016   GAD (generalized anxiety disorder) 05/22/2016   Hypertension, essential 05/22/2016   Panic attacks 05/22/2016   Essential hypertension 08/30/2015   Back muscle spasm 01/31/2014   Abnormal LFTs 01/31/2014   Blood glucose elevated 01/31/2014   Insomnia 01/31/2014   Lumbar facet syndrome (Bilateral) (R>L) 01/31/2014   Elevated LFTs 01/31/2014   Avitaminosis D 07/03/2011   Vitamin D deficiency 07/03/2011   HLD (hyperlipidemia) 06/23/2011   Hypertriglyceridemia 06/23/2011    Past Surgical History:  Procedure Laterality Date   CESAREAN SECTION  CHOLECYSTECTOMY     FOOT SURGERY Left    FRACTURE SURGERY Right 07/2020   R wrist    HERNIA REPAIR     KNEE ARTHROSCOPY     KYPHOPLASTY N/A 10/13/2018   Procedure: THORACIC 12 KYPHOPLASTY;  Surgeon: Phylliss Bob, MD;  Location: [facility];  Service: Orthopedics;  Laterality: N/A;   KYPHOPLASTY N/A 02/05/2021   Procedure: LUMBAR ONE  AND LUMBAR FOUR  KYPHOPLASTY;  Surgeon: Phylliss Bob, MD;  Location: [facility];  Service: Orthopedics;  Laterality: N/A;     OB History   No obstetric history on file.     Family History  Problem Relation Age of Onset   Seizures Neg Hx     Social History   Tobacco Use   Smoking status: Never   Smokeless tobacco: Never  Vaping Use   Vaping Use: Never used  Substance Use Topics   Alcohol use: Yes    Comment: 24 oz of vodka a day   Drug use: Never    Home Medications Prior to Admission medications   Medication Sig Start Date End Date Taking? Authorizing Provider  acetaminophen (TYLENOL) 500 MG tablet Take 1,000 mg by mouth every 8 (eight) hours as needed for mild pain or headache.    [provider]  ALPRAZolam [name] 1 MG tablet Take 2 mg by mouth at bedtime.    [provider]  aspirin EC 81 MG tablet Take 81 mg by mouth in the morning. Swallow whole.    [provider]  atorvastatin (LIPITOR) 40 MG tablet Take 40 mg by mouth daily.  06/12/16   [provider]  desvenlafaxine (PRISTIQ) 100 MG 24 hr tablet Take 100 mg by mouth daily.    [provider]  fenofibrate 160 MG tablet Take 160 mg by mouth daily.  06/10/16   [provider]  fexofenadine (ALLEGRA) 180 MG tablet Take 180 mg by mouth daily as needed for allergies or rhinitis.    [provider]  folic acid (FOLVITE) 1 MG tablet Take 1 tablet (1 mg total) by mouth daily. 05/20/21   Cristal Deer, MD  omeprazole (PRILOSEC) 40 MG capsule Take 40 mg by mouth daily as needed (for reflux).    [provider]  ondansetron (ZOFRAN-ODT) 8 MG disintegrating tablet DISSOLVE 1 TABLET BY MOUTH EVERY 4 HOURS AS NEEDED FOR NAUSEA Patient taking differently: Take 8 mg by mouth every 4 (four) hours as needed for nausea (dissolve orally). 10/23/20 10/23/21  Veryl Speak, MD  potassium chloride (KLOR-CON) 10 MEQ tablet Take 1 tablet (10 mEq total) by mouth daily. 05/19/21   Cristal Deer, MD  promethazine (PHENERGAN) 25 MG tablet Take 0.5-1 tablets (12.5-25 mg total) by mouth every 8 (eight) hours as needed for nausea. 09/25/20   Davonna Belling, MD  sucralfate (CARAFATE) 1 g tablet Take 1 tablet (1 g total) by mouth 3 (three) times daily with meals. Patient taking differently: Take 1 g by mouth 3 (three) times daily as needed (to "coat the stomach"). 09/03/20   Veryl Speak, MD  thiamine 100 MG tablet Take 1 tablet (100 mg total) by mouth daily. 05/20/21   Cristal Deer, MD  tiZANidine (ZANAFLEX) 4 MG tablet Take 4 mg by mouth at bedtime as needed for muscle spasms.  04/26/20   [provider]  valsartan-hydrochlorothiazide (DIOVAN-HCT) 80-12.5 MG tablet Take 1 tablet by mouth daily.    [provider]  Vitamin D, Ergocalciferol, (DRISDOL) 50000 units CAPS capsule Take 50,000 Units by  mouth every Sunday. 03/10/16   [provider]  zolpidem (AMBIEN CR) 12.5 MG CR tablet Take 12.5 mg by mouth at bedtime. 07/16/16   [provider]    Allergies    Latex and Wound dressings  Review of Systems   Review of Systems  All other systems reviewed and are negative.  Physical Exam Updated Vital Signs LMP 06/30/2011   Physical Exam Vitals and nursing note reviewed.  60 year old female, resting comfortably and in no acute distress. Vital signs are significant for elevated blood pressure, mildly elevated respiratory rate, borderline elevated heart rate. Oxygen saturation is 97%, which is normal. Head is normocephalic and atraumatic. PERRLA, EOMI. Oropharynx is clear. Neck is nontender and supple without adenopathy or JVD. Back is nontender and there is no CVA tenderness. Lungs are clear without rales, wheezes, or rhonchi. Chest is nontender. Heart has regular rate and rhythm without murmur. Abdomen is soft, flat, with mild to moderate epigastric tenderness.  There is no rebound or guarding.  There are no masses or hepatosplenomegaly  and peristalsis is hypoactive. Extremities have no cyanosis or edema, full range of motion is present. Skin is warm and dry without rash. Neurologic: Mental status is normal, cranial nerves are intact, moves all extremities equally.  ED Results / Procedures / Treatments   Labs (all labs ordered are listed, but only abnormal results are displayed) Labs Reviewed  COMPREHENSIVE METABOLIC PANEL - Abnormal; Notable for the following components:      Result Value   Sodium 126 (*)    Chloride 88 (*)    Glucose, Bld 124 (*)    Anion gap 16 (*)    All other components within normal limits  LIPASE, BLOOD - Abnormal; Notable for the following components:   Lipase 256 (*)    All other components within normal limits  CBC WITH DIFFERENTIAL/PLATELET - Abnormal; Notable for the following components:   WBC 13.1 (*)    RBC 3.46 (*)    Hemoglobin 11.9 (*)    HCT 34.1 (*)    MCH 34.4 (*)    Platelets 578 (*)    Neutro Abs 10.9 (*)    Abs Immature Granulocytes 0.08 (*)    All other components within normal limits  URINALYSIS, ROUTINE W REFLEX MICROSCOPIC - Abnormal; Notable for the following components:   Glucose, UA 50 (*)    Hgb urine dipstick SMALL (*)    Ketones, ur 20 (*)    Leukocytes,Ua TRACE (*)    Bacteria, UA RARE (*)    All other components within normal limits  MAGNESIUM - Abnormal; Notable for the following components:   Magnesium 1.4 (*)    All other components within normal limits  SARS CORONAVIRUS 2 (TAT 6-24 HRS)   Radiology CT ABDOMEN PELVIS W CONTRAST  Result Date: 05/29/2021 CLINICAL DATA:  Diverticulitis suspected EXAM: CT ABDOMEN AND PELVIS WITH CONTRAST TECHNIQUE: Multidetector CT imaging of the abdomen and pelvis was performed using the standard protocol following bolus administration of intravenous contrast. CONTRAST:  56m OMNIPAQUE IOHEXOL 350 MG/ML SOLN COMPARISON:  07/22/2020 FINDINGS: Lower chest: Small sliding hiatal hernia. Patulous lower esophagus. Atelectasis  at the lung bases. Hepatobiliary: No focal liver abnormality.Cholecystectomy. No bile duct dilatation Pancreas: Stranding around the pancreatic head and mid to distal duodenum. No focal pancreatic mass or ductal dilatation. No necrosis or collection. Spleen: Unremarkable. Adrenals/Urinary Tract: Negative adrenals. No hydronephrosis or stone. 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 Unremarkable  bladder. Stomach/Bowel: Inflammation around the duodenum as noted above. No obstruction. Vascular/Lymphatic: No acute vascular abnormality. Scattered atheromatous calcifications. No mass or adenopathy. Reproductive:4 cm intramural fibroid in the right aspect of the uterine body. Other: No ascites or pneumoperitoneum. Musculoskeletal: No acute abnormalities. Advanced lumbar spine degeneration with scoliosis. Prior compression fractures and cement augmentation at T12, L1, and L4. IMPRESSION: 1. Inflammation at the pancreatic duodenal groove, favor pancreatitis over duodenitis. No necrosis or collection. 2. Chronic findings are described above. Electronically Signed   By: Monte Fantasia M.D.   On: 05/29/2021 05:32    Procedures Procedures   Medications Ordered in ED Medications  morphine 4 MG/ML injection 4 mg (has no administration in time range)  diphenhydrAMINE (BENADRYL) injection 25 mg (has no administration in time range)  ondansetron (ZOFRAN) injection 4 mg (has no administration in time range)  lactated ringers bolus 1,000 mL (0 mLs Intravenous Stopped 05/29/21 0518)  ondansetron (ZOFRAN) injection 4 mg (4 mg Intravenous Given 05/29/21 0326)  pantoprazole (PROTONIX) injection 40 mg (40 mg Intravenous Given 05/29/21 0326)  prochlorperazine (COMPAZINE) injection 10 mg (10 mg Intravenous Given 05/29/21 0402)  iohexol (OMNIPAQUE) 350 MG/ML injection 80 mL (80 mLs Intravenous Contrast Given 05/29/21 0510)    ED Course  I have reviewed the triage vital signs and the nursing  notes.  Pertinent labs & imaging results that were available during my care of the patient were reviewed by me and considered in my medical decision making (see chart for details).    MDM Rules/Calculators/A&P                         Upper abdominal pain with vomiting, possible viral gastritis.  Consider bowel obstruction, diverticulitis, pancreatitis.  She is given IV fluids, ondansetron and will check screening labs and send for CT of abdomen and pelvis.  Old records are reviewed confirming recent ED visit for seizure, prior ED visits for pancreatitis.  Laboratory work-up shows moderately elevated lipase, thrombocytosis which is probably reactive.  CT scan is consistent with pancreatitis.  Unfortunately, the patient continues to complain of severe nausea in spite of intravenous ondansetron and promethazine.  Will need to be admitted for IV hydration control of nausea and pain.  Case discussed with Dr. Lorin Mercy of [facility], who agrees to admit the patient.  Final Clinical Impression(s) / ED Diagnoses Final diagnoses:  Acute pancreatitis without infection or necrosis, unspecified pancreatitis type  Elevated blood pressure reading with diagnosis of hypertension  Thrombocytosis    Rx / DC Orders ED Discharge Orders     None        Delora Fuel, MD [id number] [phone]

## 2021-05-29 NOTE — ED Notes (Addendum)
Attempted report, nurse unable to receive at this time  

## 2021-05-29 NOTE — ED Triage Notes (Signed)
Pt BIB [facility] from home for HTN related to N/V and epigastric pain this moring which may have caused her to throw up her BP meds.   EMS reports she has vomited a couple of times with them.  They were not able to establish an IV.   Seen a week ago for seizure, has not seen Neuro yet.  EMS vitals Intial BP 210/100, last BP 194/126,  02-100%, HR 108, CBG 154. 12 lead unremarkable.

## 2021-05-29 NOTE — ED Notes (Signed)
Back from ct.

## 2021-05-29 NOTE — H&P (Signed)
History and Physical    Rhonda Espinoza [id number] DOB: Sep 21, 1961 DOA: 05/29/2021  PCP: Chesley Noon, MD Consultants:  [facility] - orthopedics; [facility] Collard - surgery Patient coming from:  Home - lives with husband; NOK: Olam Idler Rometta Cotrell, [phone]   Chief Complaint: Abdominal pain  HPI: Rhonda Espinoza is a 60 y.o. female with medical history significant of ADHD; HTN; HLD; and alcoholic pancreatitis presenting with abdominal pain.  She was last admitted from 8/20-21 for alcohol withdrawal seizure.  She reports that she has been unbelievably nauseated for about 3 days.  +emesis, last was about 0500.  RLQ pain also noted.  She was concerned about being dehydrated after recent seizure; no further seizure activity.  She has had NO alcohol since her last hospitalization.    ED Course: Pancreatitis, mild, recurrent.  Has nausea, given IVF and nausea meds.  Lipase 250.   Review of Systems: As per HPI; otherwise review of systems reviewed and negative.   Ambulatory Status:  Ambulates without assistance  COVID Vaccine Status:   Complete - J&J plus 1-2 boosters  Past Medical History:  Diagnosis Date   ADD (attention deficit disorder)    Alcohol withdrawal seizure ([place])    Alcohol-induced pancreatitis    Anxiety    Arthritis    Asthma    allergy induced per patient   Disc disorder    Hyperlipidemia    Hypertension    Pneumonia     Past Surgical History:  Procedure Laterality Date   CESAREAN SECTION     CHOLECYSTECTOMY  2022   FOOT SURGERY Left    FRACTURE SURGERY Right 07/2020   R wrist    HERNIA REPAIR     KNEE ARTHROSCOPY     KYPHOPLASTY N/A 10/13/2018   Procedure: THORACIC 12 KYPHOPLASTY;  Surgeon: Phylliss Bob, MD;  Location: [place];  Service: Orthopedics;  Laterality: N/A;   KYPHOPLASTY N/A 02/05/2021   Procedure: LUMBAR ONE  AND LUMBAR FOUR KYPHOPLASTY;  Surgeon: Phylliss Bob, MD;  Location: [place];  Service: Orthopedics;  Laterality: N/A;    Social History    Socioeconomic History   Marital status: Married    Spouse name: Not on file   Number of children: Not on file   Years of education: Not on file   Highest education level: Not on file  Occupational History   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  Tobacco Use   Smoking status: Never   Smokeless tobacco: Never  Vaping Use   Vaping Use: Never used  Substance and Sexual Activity   Alcohol use: Yes    Comment: reports 2 glasses/day of vodka with club soda and grenadine   Drug use: Never   Sexual activity: Not on file  Other Topics Concern   Not on file  Social History Narrative   Not on file   Social Determinants of Health   Financial Resource Strain: Not on file  Food Insecurity: Not on file  Transportation Needs: Not on file  Physical Activity: Not on file  Stress: Not on file  Social Connections: Not on file  Intimate Partner Violence: Not on file    Allergies  Allergen Reactions   Latex Other (See Comments)    "moreso like bandages"- these cause blisters   Wound Dressings Other (See Comments)    "moreso like bandages"- these cause blisters    Family History  Problem Relation Age of Onset   Pancreatitis Brother    Seizures Neg Hx  Prior to Admission medications   Medication Sig Start Date End Date Taking? Authorizing Provider  acetaminophen (TYLENOL) 500 MG tablet Take 1,000 mg by mouth every 8 (eight) hours as needed for mild pain or headache.    [provider]  ALPRAZolam [name] 1 MG tablet Take 2 mg by mouth at bedtime.    [provider]  aspirin EC 81 MG tablet Take 81 mg by mouth in the morning. Swallow whole.    [provider]  atorvastatin (LIPITOR) 40 MG tablet Take 40 mg by mouth daily.  06/12/16   [provider]  desvenlafaxine (PRISTIQ) 100 MG 24 hr tablet Take 100 mg by mouth daily.    [provider]  fenofibrate 160 MG tablet Take 160 mg by mouth daily.  06/10/16   [provider]  fexofenadine (ALLEGRA) 180 MG tablet Take 180 mg by mouth daily as needed for allergies or rhinitis.    [provider]  folic acid (FOLVITE) 1 MG tablet Take 1 tablet (1 mg total) by mouth daily. 05/20/21   Cristal Deer, MD  omeprazole (PRILOSEC) 40 MG capsule Take 40 mg by mouth daily as needed (for reflux).    [provider]  ondansetron (ZOFRAN-ODT) 8 MG disintegrating tablet DISSOLVE 1 TABLET BY MOUTH EVERY 4 HOURS AS NEEDED FOR NAUSEA Patient taking differently: Take 8 mg by mouth every 4 (four) hours as needed for nausea (dissolve orally). 10/23/20 10/23/21  Veryl Speak, MD  potassium chloride (KLOR-CON) 10 MEQ tablet Take 1 tablet (10 mEq total) by mouth daily. 05/19/21   Cristal Deer, MD  promethazine (PHENERGAN) 25 MG tablet Take 0.5-1 tablets (12.5-25 mg total) by mouth every 8 (eight) hours as needed for nausea. 09/25/20   Davonna Belling, MD  sucralfate (CARAFATE) 1 g tablet Take 1 tablet (1 g total) by mouth 3 (three) times daily with meals. Patient taking differently: Take 1 g by mouth 3 (three) times daily as needed (to "coat the stomach"). 09/03/20   Veryl Speak, MD  thiamine 100 MG tablet Take 1 tablet (100 mg total) by mouth daily. 05/20/21   Cristal Deer, MD  tiZANidine (ZANAFLEX) 4 MG tablet Take 4 mg by mouth at bedtime as needed for muscle spasms.  04/26/20   [provider]  valsartan-hydrochlorothiazide (DIOVAN-HCT) 80-12.5 MG tablet Take 1 tablet by mouth daily.    [provider]  Vitamin D, Ergocalciferol, (DRISDOL) 50000 units CAPS capsule Take 50,000 Units by mouth every Sunday. 03/10/16   [provider]  zolpidem (AMBIEN CR) 12.5 MG CR tablet Take 12.5 mg by mouth at bedtime. 07/16/16   [provider]    Physical Exam: Vitals:   05/29/21 1030 05/29/21 1100 05/29/21 1108 05/29/21 1109  BP: (!) 185/78 (!) 177/82    Pulse: 99 100    Resp: 16 15    Temp:      TempSrc:      SpO2: 93%  93% 99% 95%  Weight:      Height:         General:  Appears calm and comfortable and is in NAD Eyes:  PERRL, EOMI, normal lids, iris ENT:  grossly normal hearing, lips & tongue, mmm Neck:  no LAD, masses or thyromegaly Cardiovascular:  RRR, no m/r/g. No LE edema.  Respiratory:   CTA bilaterally with no wheezes/rales/rhonchi.  Normal respiratory effort. Abdomen:  soft, LUQ abdominal pain, ND, NABS Skin:  no rash or induration seen on limited exam Musculoskeletal:  grossly normal  tone BUE/BLE, good ROM, no bony abnormality Psychiatric:  grossly normal mood and affect, speech fluent and appropriate, AOx3 Neurologic:  CN 2-12 grossly intact, moves all extremities in coordinated fashion    Radiological Exams on Admission: Independently reviewed - see discussion in A/P where applicable  CT ABDOMEN PELVIS W CONTRAST  Result Date: 05/29/2021 CLINICAL DATA:  Diverticulitis suspected EXAM: CT ABDOMEN AND PELVIS WITH CONTRAST TECHNIQUE: Multidetector CT imaging of the abdomen and pelvis was performed using the standard protocol following bolus administration of intravenous contrast. CONTRAST:  46m OMNIPAQUE IOHEXOL 350 MG/ML SOLN COMPARISON:  07/22/2020 FINDINGS: Lower chest: Small sliding hiatal hernia. Patulous lower esophagus. Atelectasis at the lung bases. Hepatobiliary: No focal liver abnormality.Cholecystectomy. No bile duct dilatation Pancreas: Stranding around the pancreatic head and mid to distal duodenum. No focal pancreatic mass or ductal dilatation. No necrosis or collection. Spleen: Unremarkable. Adrenals/Urinary Tract: Negative adrenals. No hydronephrosis or stone. 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 Unremarkable bladder. Stomach/Bowel: Inflammation around the duodenum as noted above. No obstruction. Vascular/Lymphatic: No acute vascular abnormality. Scattered atheromatous calcifications. No mass or adenopathy. Reproductive:4 cm intramural  fibroid in the right aspect of the uterine body. Other: No ascites or pneumoperitoneum. Musculoskeletal: No acute abnormalities. Advanced lumbar spine degeneration with scoliosis. Prior compression fractures and cement augmentation at T12, L1, and L4. IMPRESSION: 1. Inflammation at the pancreatic duodenal groove, favor pancreatitis over duodenitis. No necrosis or collection. 2. Chronic findings are described above. Electronically Signed   By: JMonte FantasiaM.D.   On: 05/29/2021 05:32    EKG: not done   Labs on Admission: I have personally reviewed the available labs and imaging studies at the time of the admission.  Pertinent labs:   Na++ 126 Glucose 124 Anion gap 16 Mag++ 1.4 Lipase 256 WBC 13.1 Hgb 11.9 Platelets 578 UA: 50 glucose, small Hgb, 20 ketones, trace LE, rare bacteria ETOH <10   Assessment/Plan Principal Problem:   Acute alcoholic pancreatitis Active Problems:   HLD (hyperlipidemia)   Essential hypertension   Hypertension, essential   Class 1 obesity with body mass index (BMI) of 33.0 to 33.9 in adult   Alcohol withdrawal seizure (HCC)   N/V -Patient with h/o alcoholic pancreatitis and gallbladder disease s/p cholecystectomy -Now presenting with recurrent n/v -She reports no further ETOH since last admission a week ago for withdrawal seizures -Her description of pain is RLQ although her TTP is LUQ -Possible recurrent pancreatitis based on exam, mildly elevated lipase, and CT findings -Esophagitis/duodenitis is also a consideration -Will admit to Med Surg -Strict NPO for now -Aggressive IVF hydration at least for the first 12 hours with LR at 200 cc/hr -Pain control with morphine 2 mg q2h prn.   -Nausea control with Zofran -Will start IV Protonix x 2 doses today and then transition to PO tomorrow -Continue Carafate  Alcohol dependence with recent withdrawal seizure -Reports no longer drinking x 1 week -Also reports that prior descriptions of ETOH intake  (24 ounces/day of vodka) were exaggerated -CIWA protocol -Folate, thiamine, and MVI ordered -Will provide symptom-triggered BZD (ativan per CIWA protocol) only since the patient is able to communicate; is not showing current signs of delirium; and has no history of severe withdrawal. -Denies any further seizure activity  HTN -Continue Diovan-HCT  HLD -Continue Lipitor, fenofibrate  Mood d/o -Continue Pristiq, Ambien -Hold Xanax given CIWA protocol  Obesity -Body mass index is 30.41 kg/m..  -Weight loss should be encouraged -Outpatient PCP/bariatric medicine f/u  encouraged      Note: This patient has been tested and is negative for the novel coronavirus COVID-19. The patient has been fully vaccinated against COVID-19.   Level of care: Med-Surg DVT prophylaxis:  Lovenox Code Status:  Full - confirmed with patient Family Communication: None present Disposition Plan:  The patient is from: home  Anticipated d/c is to: home without [facility] services  Anticipated d/c date will depend on clinical response to treatment, likely 1-2 days  Patient is currently: acutely ill Consults called: TOC team  Admission status:  Admit - It is my clinical opinion that admission to INPATIENT is reasonable and necessary because of the expectation that this patient will require hospital care that crosses at least 2 midnights to treat this condition based on the medical complexity of the problems presented.  Given the aforementioned information, the predictability of an adverse outcome is felt to be significant.    Karmen Bongo MD [facility]   How to contact the [facility] Attending or Consulting provider [place] or covering provider during after hours [place], for this patient?  Check the care team in [facility] and look for a) attending/consulting [facility] provider listed and b) the [facility] team listed Log into [email or web address] and use [place]'s universal password to access. If you do not have the password, please  contact the hospital operator. Locate the [facility] provider you are looking for under [facility] and page to a number that you can be directly reached. If you still have difficulty reaching the provider, please page the [facility] (Director on Call) for the Hospitalists listed on amion for assistance.   05/29/2021, 2:25 PM

## 2021-05-29 NOTE — ED Notes (Signed)
Patient transported to CT 

## 2021-05-29 NOTE — ED Notes (Signed)
Nurse unable to take report at this time. Will call back in 5 minutes

## 2021-05-30 LAB — CBC
HCT: 33.1 % — ABNORMAL LOW (ref 36.0–46.0)
Hemoglobin: 11.6 g/dL — ABNORMAL LOW (ref 12.0–15.0)
MCH: 33.8 pg (ref 26.0–34.0)
MCHC: 35 g/dL (ref 30.0–36.0)
MCV: 96.5 fL (ref 80.0–100.0)
Platelets: 616 10*3/uL — ABNORMAL HIGH (ref 150–400)
RBC: 3.43 MIL/uL — ABNORMAL LOW (ref 3.87–5.11)
RDW: 12.9 % (ref 11.5–15.5)
WBC: 9 10*3/uL (ref 4.0–10.5)
nRBC: 0 % (ref 0.0–0.2)

## 2021-05-30 LAB — COMPREHENSIVE METABOLIC PANEL
ALT: 12 U/L (ref 0–44)
AST: 16 U/L (ref 15–41)
Albumin: 3.9 g/dL (ref 3.5–5.0)
Alkaline Phosphatase: 99 U/L (ref 38–126)
Anion gap: 16 — ABNORMAL HIGH (ref 5–15)
BUN: 5 mg/dL — ABNORMAL LOW (ref 6–20)
CO2: 24 mmol/L (ref 22–32)
Calcium: 9.7 mg/dL (ref 8.9–10.3)
Chloride: 94 mmol/L — ABNORMAL LOW (ref 98–111)
Creatinine, Ser: 0.91 mg/dL (ref 0.44–1.00)
GFR, Estimated: >60 mL/min (ref >60)
Glucose, Bld: 118 mg/dL — ABNORMAL HIGH (ref 70–99)
Potassium: 3.1 mmol/L — ABNORMAL LOW (ref 3.5–5.1)
Sodium: 134 mmol/L — ABNORMAL LOW (ref 135–145)
Total Bilirubin: 1.4 mg/dL — ABNORMAL HIGH (ref 0.3–1.2)
Total Protein: 7.1 g/dL (ref 6.5–8.1)

## 2021-05-30 LAB — MAGNESIUM: Magnesium: 1.8 mg/dL (ref 1.7–2.4)

## 2021-05-30 MED ORDER — POTASSIUM CHLORIDE CRYS ER 20 MEQ PO TBCR
40.0000 meq | EXTENDED_RELEASE_TABLET | ORAL | Status: AC
  Administered 2021-05-30 (×2): 40 meq via ORAL
  Filled 2021-05-30 (×2): qty 2

## 2021-05-30 NOTE — TOC CAGE-AID Note (Signed)
Transition of Care [facility]) - CAGE-AID Screening   Patient Details  Name: Rhonda Espinoza MRN: [id number] Date of Birth: 1961/02/17  Transition of Care [facility]) CM/SW Contact:    Horald Birky C Tarpley-Carter, [place] Phone Number: 05/30/2021, 2:06 PM   Clinical Narrative: Pt participated in [place].  Pt stated she does not use substance or ETOH.  Pt stated she did use ETOH frequently in the past, but stopped 22 days ago.  Pt was not offered resources, due to no usage of substance or ETOH.     Drelyn Pistilli Tarpley-Carter, MSW, LCSW-A Pronouns:  She/Her/Hers [facility] Health[facility]itions of Care Clinical Social Worker Direct Number:  [phone] [email or web address]  CAGE-AID Screening:    Have You Ever Felt You Ought to Cut Down on Your Drinking or Drug Use?: No Have People Annoyed You By [employer] Your Drinking Or Drug Use?: No Have You Felt Bad Or Guilty About Your Drinking Or Drug Use?: No Have You Ever Had a Drink or Used Drugs First Thing In The Morning to Steady Your Nerves or to Get Rid of a Hangover?: No CAGE-AID Score: 0  Substance Abuse Education Offered: Yes

## 2021-05-30 NOTE — Progress Notes (Signed)
Discharge summary packet provided to pt with instrcutions. Pt verbalized understanding of instructions. No complaints. Pt d/c to home as ordered. Spouse is responsible for  her ride.

## 2021-05-30 NOTE — Discharge Summary (Addendum)
Physician Discharge Summary  Rhonda Espinoza [id number] DOB: 1961-08-27 DOA: 05/29/2021  PCP: Chesley Noon, MD  Admit date: 05/29/2021 Discharge date: 05/30/2021 30 Day Unplanned Readmission Risk Score    Flowsheet Row ED to Hosp-Admission (Current) from 05/29/2021 in [facility]  30 Day Unplanned Readmission Risk Score (%) 20.51 Filed at 05/30/2021 0801       This score is the patient's risk of an unplanned readmission within 30 days of being discharged (0 -100%). The score is based on dignosis, age, lab data, medications, orders, and past utilization.   Low:  0-14.9   Medium: 15-21.9   High: 22-29.9   Extreme: 30 and above          Admitted From: Home Disposition: Home  Recommendations for Outpatient Follow-up:  Follow up with PCP in 1-2 weeks Please obtain BMP/CBC in one week Please follow up with your PCP on the following pending results: Unresulted Labs (From admission, onward)     Start     Ordered   05/30/21 0801  Magnesium  Once,   R       Question:  Specimen collection method  Answer:  Lab=Lab collect   05/30/21 0800              Home Health: None Equipment/Devices: None  Discharge Condition: Stable CODE STATUS: Full code Diet recommendation: Cardiac  Subjective: Seen and examined.  Denies any pain, nausea or any other complaint.  Brief/Interim Summary: Rhonda Espinoza is a 60 y.o. female with medical history significant of ADHD; HTN; HLD; and alcoholic pancreatitis presented with abdominal pain and once again was admitted with the diagnosis of acute alcoholic pancreatitis.  She was kept n.p.o. with IV fluids.  This morning she has no pain or any other complaint.  Since she was n.p.o., I had recommended her that we should initiate her on clear liquid diets and over the course of the day we should advance her to soft diet for the dinner and possibly discharge her tomorrow to make sure that she is tolerating the diet  however patient told me that she was given the impression by physicians yesterday that she could have gone yesterday however she was being admitted just for overnight and that she will be discharged today so now she wants to go home.  She committed to me that she knows how to take care of her pancreatitis and knows how to slowly advance her diet and she would like to do that at home instead of in the hospital.  Since she is hemodynamically stable, she is being discharged per her insistence.  Of note, her potassium was 3.1 today.  Patient will get potassium chloride 40 mEq x 2 doses before discharge.  She had low magnesium yesterday, magnesium level is pending this morning.  If that will be low, that will be replenished as well before discharge.  Discharge Diagnoses:  Principal Problem:   Acute alcoholic pancreatitis Active Problems:   HLD (hyperlipidemia)   Essential hypertension   Hypertension, essential   Class 1 obesity with body mass index (BMI) of 33.0 to 33.9 in adult   Alcohol withdrawal seizure [facility])    Discharge Instructions   Allergies as of 05/30/2021       Reactions   Latex Other (See Comments)   "moreso like bandages"- these cause blisters   Wound Dressings Other (See Comments)   "moreso like bandages"- these cause blisters        Medication  List     TAKE these medications    acetaminophen 500 MG tablet Commonly known as: TYLENOL Take 1,000 mg by mouth every 8 (eight) hours as needed for mild pain or headache.   ALPRAZolam 1 MG tablet Commonly known as: XANAX Take 0.5 mg by mouth 3 (three) times daily as needed for sleep or anxiety.   aspirin EC 81 MG tablet Take 81 mg by mouth in the morning. Swallow whole.   atorvastatin 40 MG tablet Commonly known as: LIPITOR Take 40 mg by mouth daily.   desvenlafaxine 100 MG 24 hr tablet Commonly known as: PRISTIQ Take 100 mg by mouth daily.   fenofibrate 160 MG tablet Take 160 mg by mouth daily.   fexofenadine  180 MG tablet Commonly known as: ALLEGRA Take 180 mg by mouth daily as needed for allergies or rhinitis.   folic acid 1 MG tablet Commonly known as: FOLVITE Take 1 tablet (1 mg total) by mouth daily.   omeprazole 40 MG capsule Commonly known as: PRILOSEC Take 40 mg by mouth daily as needed (for reflux).   ondansetron 8 MG disintegrating tablet Commonly known as: ZOFRAN-ODT DISSOLVE 1 TABLET BY MOUTH EVERY 4 HOURS AS NEEDED FOR NAUSEA What changed:  how much to take how to take this when to take this reasons to take this   potassium chloride 10 MEQ tablet Commonly known as: KLOR-CON Take 1 tablet (10 mEq total) by mouth daily.   promethazine 25 MG tablet Commonly known as: PHENERGAN Take 0.5-1 tablets (12.5-25 mg total) by mouth every 8 (eight) hours as needed for nausea.   sucralfate 1 g tablet Commonly known as: Carafate Take 1 tablet (1 g total) by mouth 3 (three) times daily with meals. What changed:  when to take this reasons to take this   thiamine 100 MG tablet Take 1 tablet (100 mg total) by mouth daily.   tiZANidine 4 MG tablet Commonly known as: ZANAFLEX Take 4 mg by mouth at bedtime as needed for muscle spasms.   traMADol 50 MG tablet Commonly known as: ULTRAM Take 50 mg by mouth 3 (three) times daily as needed for pain.   valsartan-hydrochlorothiazide 80-12.5 MG tablet Commonly known as: DIOVAN-HCT Take 1 tablet by mouth daily.   Vitamin D (Ergocalciferol) 1.25 MG (50000 UNIT) Caps capsule Commonly known as: DRISDOL Take 50,000 Units by mouth every Sunday.   zolpidem 12.5 MG CR tablet Commonly known as: AMBIEN CR Take 12.5 mg by mouth at bedtime.        Follow-up Information     Chesley Noon, MD Follow up in 1 week(s).   Specialty: Family Medicine Contact information: [address] [place]  [address] [phone]                Allergies  Allergen Reactions   Latex Other (See Comments)    "moreso like  bandages"- these cause blisters   Wound Dressings Other (See Comments)    "moreso like bandages"- these cause blisters    Consultations: None   Procedures/Studies: CT HEAD WO CONTRAST (5MM)  Result Date: 05/18/2021 CLINICAL DATA:  Seizure EXAM: CT HEAD WITHOUT CONTRAST TECHNIQUE: Contiguous axial images were obtained from the base of the skull through the vertex without intravenous contrast. COMPARISON:  None. FINDINGS: Brain: Normal anatomic configuration. No abnormal intra or extra-axial mass lesion or fluid collection. No abnormal mass effect or midline shift. No evidence of acute intracranial hemorrhage or infarct. Ventricular size is normal. Cerebellum unremarkable. Vascular: Unremarkable Skull:  Intact Sinuses/Orbits: Paranasal sinuses are clear. Orbits are unremarkable. Other: Mastoid air cells and middle ear cavities are clear. IMPRESSION: No acute intracranial abnormality.  Unremarkable examination. Electronically Signed   By: Fidela Salisbury M.D.   On: 05/18/2021 18:56   MR BRAIN WO CONTRAST  Result Date: 05/19/2021 CLINICAL DATA:  Mild left-sided weakness after seizure. EXAM: MRI HEAD WITHOUT CONTRAST TECHNIQUE: Multiplanar, multiecho pulse sequences of the brain and surrounding structures were obtained without intravenous contrast. COMPARISON:  Head CT from yesterday FINDINGS: Brain: No acute infarction, hemorrhage, hydrocephalus, extra-axial collection or mass lesion. No cortical finding to correlate with seizure, including at the bilateral hippocampus. Few remote white matter insults, usually microvascular ischemia. Brain volume is normal Vascular: Normal flow voids. Skull and upper cervical spine: Normal marrow signal. C3-4 facet degeneration with spurring and mild anterolisthesis. Sinuses/Orbits: Negative. IMPRESSION: Unremarkable brain MRI.  No explanation for weakness or seizure. Electronically Signed   By: Monte Fantasia M.D.   On: 05/19/2021 06:14   CT ABDOMEN PELVIS W  CONTRAST  Result Date: 05/29/2021 CLINICAL DATA:  Diverticulitis suspected EXAM: CT ABDOMEN AND PELVIS WITH CONTRAST TECHNIQUE: Multidetector CT imaging of the abdomen and pelvis was performed using the standard protocol following bolus administration of intravenous contrast. CONTRAST:  50m OMNIPAQUE IOHEXOL 350 MG/ML SOLN COMPARISON:  07/22/2020 FINDINGS: Lower chest: Small sliding hiatal hernia. Patulous lower esophagus. Atelectasis at the lung bases. Hepatobiliary: No focal liver abnormality.Cholecystectomy. No bile duct dilatation Pancreas: Stranding around the pancreatic head and mid to distal duodenum. No focal pancreatic mass or ductal dilatation. No necrosis or collection. Spleen: Unremarkable. Adrenals/Urinary Tract: Negative adrenals. No hydronephrosis or stone. 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 Unremarkable bladder. Stomach/Bowel: Inflammation around the duodenum as noted above. No obstruction. Vascular/Lymphatic: No acute vascular abnormality. Scattered atheromatous calcifications. No mass or adenopathy. Reproductive:4 cm intramural fibroid in the right aspect of the uterine body. Other: No ascites or pneumoperitoneum. Musculoskeletal: No acute abnormalities. Advanced lumbar spine degeneration with scoliosis. Prior compression fractures and cement augmentation at T12, L1, and L4. IMPRESSION: 1. Inflammation at the pancreatic duodenal groove, favor pancreatitis over duodenitis. No necrosis or collection. 2. Chronic findings are described above. Electronically Signed   By: JMonte FantasiaM.D.   On: 05/29/2021 05:32   DG Chest Portable 1 View  Result Date: 05/18/2021 CLINICAL DATA:  Seizure. EXAM: PORTABLE CHEST 1 VIEW COMPARISON:  03/17/2019 FINDINGS: Low lung volumes. Heart size is normal accounting for low lung volumes. 9 there is subsegmental atelectasis at the RIGHT lung base. No pulmonary edema or consolidation. IMPRESSION: RIGHT LOWER lobe  atelectasis. Electronically Signed   By: ENolon NationsM.D.   On: 05/18/2021 17:16     Discharge Exam: Vitals:   05/30/21 0523 05/30/21 0721  BP: (!) 175/96 (!) 178/90  Pulse: (!) 103 93  Resp:  17  Temp: 98.6 F (37 C) 97.7 F (36.5 C)  SpO2: 97% 97%   Vitals:   05/29/21 1800 05/29/21 2046 05/30/21 0523 05/30/21 0721  BP: (!) 168/80 (!) 180/79 (!) 175/96 (!) 178/90  Pulse: 92 98 (!) 103 93  Resp: '15 20  17  '$ Temp:  98.7 F (37.1 C) 98.6 F (37 C) 97.7 F (36.5 C)  TempSrc:  Oral Oral Oral  SpO2: 93% 97% 97% 97%  Weight:      Height:        General: Pt is alert, awake, not in acute distress Cardiovascular: RRR, S1/S2 +, no rubs, no gallops  Respiratory: CTA bilaterally, no wheezing, no rhonchi Abdominal: Soft, NT, ND, bowel sounds + Extremities: no edema, no cyanosis    The results of significant diagnostics from this hospitalization (including imaging, microbiology, ancillary and laboratory) are listed below for reference.     Microbiology: Recent Results (from the past 240 hour(s))  SARS CORONAVIRUS 2 (TAT 6-24 HRS) Nasopharyngeal Nasopharyngeal Swab     Status: None   Collection Time: 05/29/21  7:25 AM   Specimen: Nasopharyngeal Swab  Result Value Ref Range Status   SARS Coronavirus 2 NEGATIVE NEGATIVE Final    Comment: (NOTE) SARS-CoV-2 target nucleic acids are NOT DETECTED.  The SARS-CoV-2 RNA is generally detectable in upper and lower respiratory specimens during the acute phase of infection. Negative results do not preclude SARS-CoV-2 infection, do not rule out co-infections with other pathogens, and should not be used as the sole basis for treatment or other patient management decisions. Negative results must be combined with clinical observations, patient history, and epidemiological information. The expected result is Negative.  Fact Sheet for Patients: SugarRoll.be  Fact Sheet for Healthcare  Providers: https://www.woods-mathews.com/  This test is not yet approved or cleared by the [place] FDA and  has been authorized for detection and/or diagnosis of SARS-CoV-2 by FDA under an Emergency Use Authorization (EUA). This EUA will remain  in effect (meaning this test can be used) for the duration of the COVID-19 declaration under Se ction 564(b)(1) of the Act, 21 U.S.C. section 360bbb-3(b)(1), unless the authorization is terminated or revoked sooner.  Performed at [facility], [place] [address] [place], [place] [address]      Labs: BNP (last 3 results) No results for input(s): BNP in the last 8760 hours. Basic Metabolic Panel: Recent Labs  Lab 05/29/21 0341 05/30/21 0110  NA 126* 134*  K 3.7 3.1*  CL 88* 94*  CO2 22 24  GLUCOSE 124* 118*  BUN 14 5*  CREATININE 0.91 0.91  CALCIUM 9.5 9.7  MG 1.4*  --    Liver Function Tests: Recent Labs  Lab 05/29/21 0341 05/30/21 0110  AST 17 16  ALT 13 12  ALKPHOS 109 99  BILITOT 1.2 1.4*  PROT 7.2 7.1  ALBUMIN 4.2 3.9   Recent Labs  Lab 05/29/21 0341  LIPASE 256*   No results for input(s): AMMONIA in the last 168 hours. CBC: Recent Labs  Lab 05/29/21 0341 05/30/21 0110  WBC 13.1* 9.0  NEUTROABS 10.9*  --   HGB 11.9* 11.6*  HCT 34.1* 33.1*  MCV 98.6 96.5  PLT 578* 616*   Cardiac Enzymes: No results for input(s): CKTOTAL, CKMB, CKMBINDEX, TROPONINI in the last 168 hours. BNP: Invalid input(s): POCBNP CBG: No results for input(s): GLUCAP in the last 168 hours. D-Dimer No results for input(s): DDIMER in the last 72 hours. Hgb A1c No results for input(s): HGBA1C in the last 72 hours. Lipid Profile No results for input(s): CHOL, HDL, LDLCALC, TRIG, CHOLHDL, LDLDIRECT in the last 72 hours. Thyroid function studies No results for input(s): TSH, T4TOTAL, T3FREE, THYROIDAB in the last 72 hours.  Invalid input(s): FREET3 Anemia work up No results for input(s): VITAMINB12, FOLATE,  FERRITIN, TIBC, IRON, RETICCTPCT in the last 72 hours. Urinalysis    Component Value Date/Time   COLORURINE YELLOW 05/29/2021 [place] 05/29/2021 0455   LABSPEC 1.008 05/29/2021 0455   PHURINE 5.0 05/29/2021 0455   GLUCOSEU 50 (A) 05/29/2021 0455   HGBUR SMALL (A) 05/29/2021 0455   BILIRUBINUR NEGATIVE  05/29/2021 0455   KETONESUR 20 (A) 05/29/2021 0455   PROTEINUR NEGATIVE 05/29/2021 0455   NITRITE NEGATIVE 05/29/2021 0455   LEUKOCYTESUR TRACE (A) 05/29/2021 0455   Sepsis Labs Invalid input(s): PROCALCITONIN,  WBC,  LACTICIDVEN Microbiology Recent Results (from the past 240 hour(s))  SARS CORONAVIRUS 2 (TAT 6-24 HRS) Nasopharyngeal Nasopharyngeal Swab     Status: None   Collection Time: 05/29/21  7:25 AM   Specimen: Nasopharyngeal Swab  Result Value Ref Range Status   SARS Coronavirus 2 NEGATIVE NEGATIVE Final    Comment: (NOTE) SARS-CoV-2 target nucleic acids are NOT DETECTED.  The SARS-CoV-2 RNA is generally detectable in upper and lower respiratory specimens during the acute phase of infection. Negative results do not preclude SARS-CoV-2 infection, do not rule out co-infections with other pathogens, and should not be used as the sole basis for treatment or other patient management decisions. Negative results must be combined with clinical observations, patient history, and epidemiological information. The expected result is Negative.  Fact Sheet for Patients: SugarRoll.be  Fact Sheet for Healthcare Providers: https://www.woods-mathews.com/  This test is not yet approved or cleared by the [place] FDA and  has been authorized for detection and/or diagnosis of SARS-CoV-2 by FDA under an Emergency Use Authorization (EUA). This EUA will remain  in effect (meaning this test can be used) for the duration of the COVID-19 declaration under Se ction 564(b)(1) of the Act, 21 U.S.C. section 360bbb-3(b)(1), unless  the authorization is terminated or revoked sooner.  Performed at [facility], [place] [address] [place], [place] [address]      Time coordinating discharge: Over 30 minutes  SIGNED:   Darliss Cheney, MD  [facility] 05/30/2021, 9:15 AM  If 7PM-7AM, please contact night-coverage [email or web address]

## 2021-10-17 ENCOUNTER — Other Ambulatory Visit: Payer: Self-pay | Admitting: Gastroenterology

## 2021-10-17 DIAGNOSIS — K869 Disease of pancreas, unspecified: Secondary | ICD-10-CM

## 2022-01-28 ENCOUNTER — Ambulatory Visit
Admission: RE | Admit: 2022-01-28 | Discharge: 2022-01-28 | Disposition: A | Discharge status: Home / Self Care | Payer: BLUE CROSS/BLUE SHIELD | Source: Ambulatory Visit | Attending: Gastroenterology | Admitting: Gastroenterology

## 2022-01-28 DIAGNOSIS — K869 Disease of pancreas, unspecified: Secondary | ICD-10-CM

## 2022-01-28 IMAGING — MR MR ABDOMEN WO/W CM MRCP
13 of 19 series · 31 of 48 positions shown · IV contrast (multihance)
Comparison: Multiple priors including CT [DATE] and MRI
[DATE]

CLINICAL DATA: Follow-up pancreatic lesion seen on prior MRI.

EXAM:
MRI ABDOMEN WITHOUT AND WITH CONTRAST (INCLUDING MRCP)
TECHNIQUE: Multiplanar multisequence MR imaging of the abdomen was performed
both before and after the administration of intravenous contrast.
Heavily T2-weighted images of the biliary and pancreatic ducts were
obtained, and three-dimensional MRCP images were rendered by post
processing.
CONTRAST:  20mL MULTIHANCE GADOBENATE DIMEGLUMINE 529 MG/ML IV SOLN

[Series 3: T2 · coronal · 5.0mm · 1.56mm/px · 2 of 39 slices shown (1 of 3)]
[im 1/39]
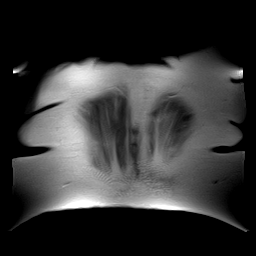
[im 39/39]
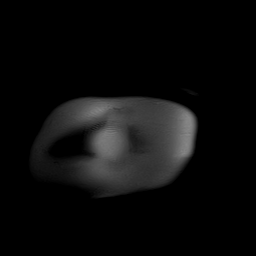

[Series 4: T2 · axial · 6.0mm · 1.56mm/px · z∈[-31,+224]mm · 2 of 38 slices shown (2 of 3)]
[im 1/38]
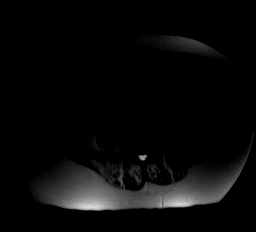
[im 38/38]
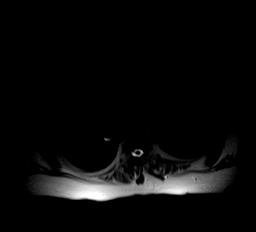

[Series 9: T2 · axial · 6.0mm · 0.78mm/px · z∈[-32,+223]mm · 2 of 38 slices shown (3 of 3)]
[im 1/38]
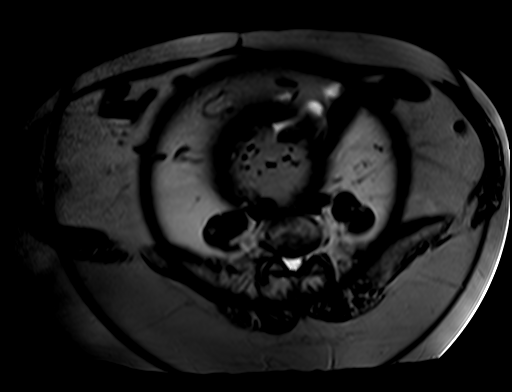
[im 38/38]
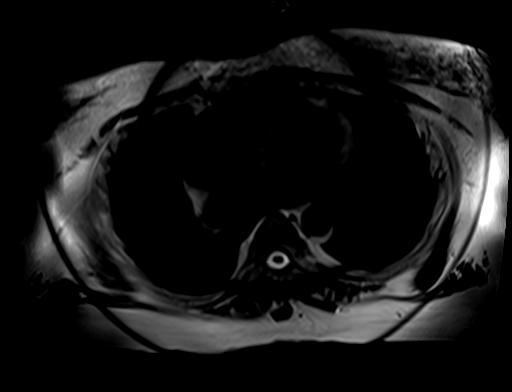

[Series 12: ep2d_[id number]_500_800_p2 · axial · 6.0mm · 2.08mm/px · z∈[-30,+225]mm · 4 of 114 slices shown]
[im 1/114]
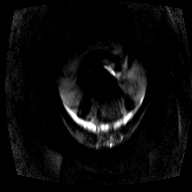
[im 38/114]
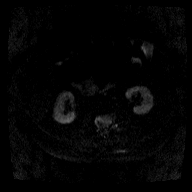
[im 76/114]
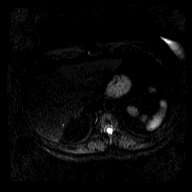
[im 114/114]
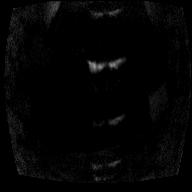

[Series 13: ep2d_[id number]_800_p2_adc · axial · 6.0mm · 2.08mm/px · 1 of 38 slices shown]
[im 1/38]
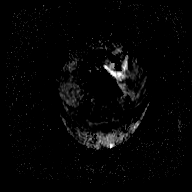

[Series 14: axial tru fisp · axial · 5.0mm · 1.56mm/px · 1 of 43 slices shown]
[im 1/43]
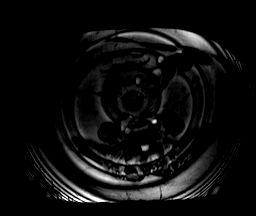

[Series 15: axial in out · axial · 6.0mm · 0.78mm/px · z∈[-24,+217]mm · 2 of 72 slices shown]
[im 1/72]
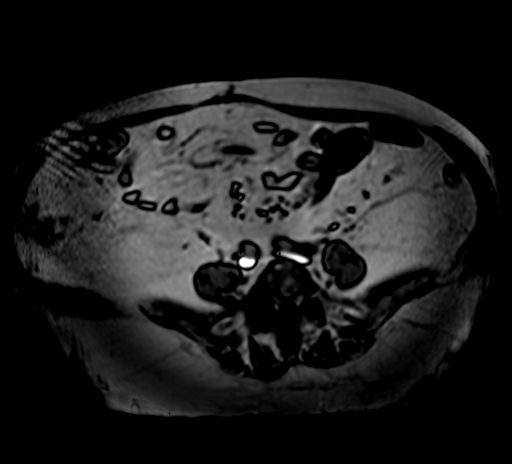
[im 72/72]
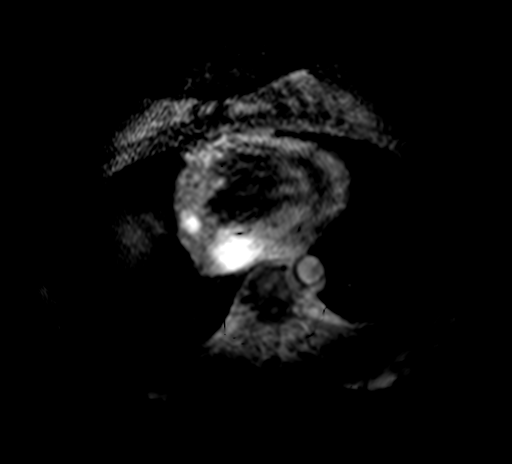

[Series 16: T1 dynamic · axial · non-contrast · 2.5mm · 0.78mm/px · z∈[-24,+214]mm · 3 of 96 slices shown]
[im 1/96]
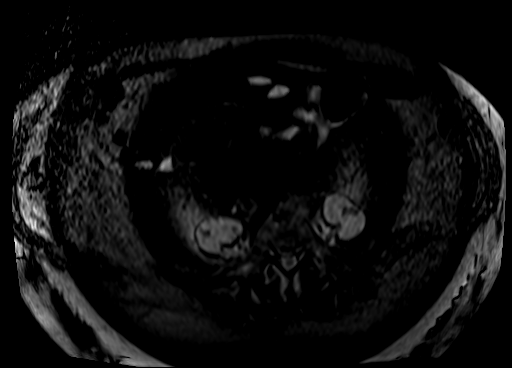
[im 48/96]
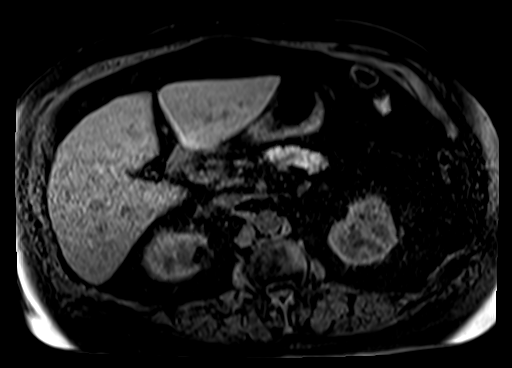
[im 96/96]
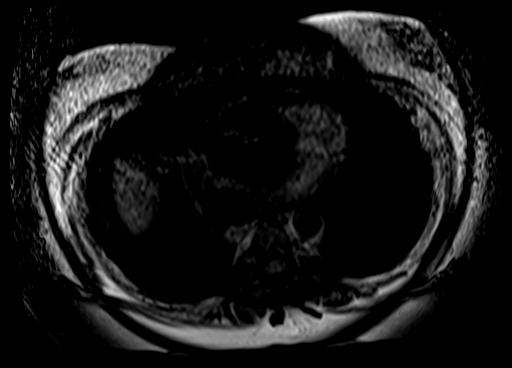

[Series 17: post 30 sec · axial · 2.5mm · 0.78mm/px · z∈[-24,+214]mm · 3 of 96 slices shown]
[im 1/96]
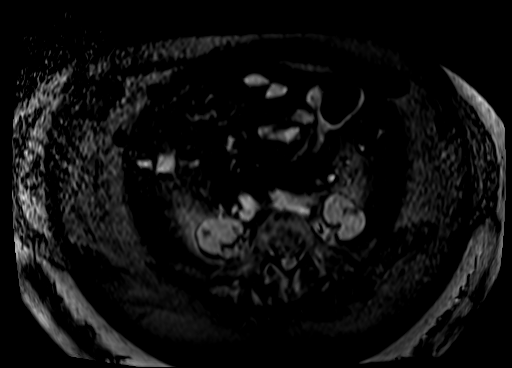
[im 48/96]
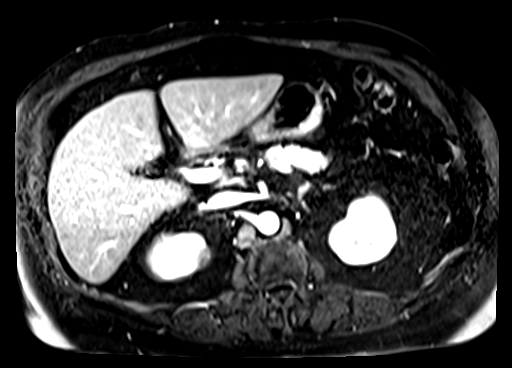
[im 96/96]
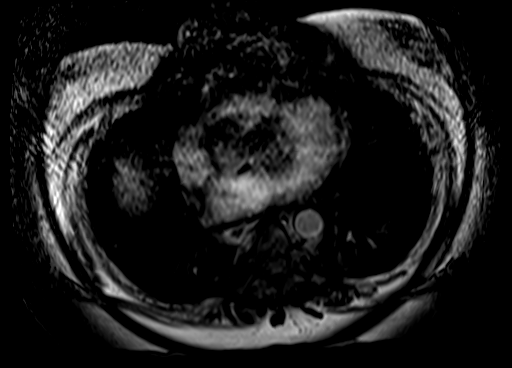

[Series 18: post 30 sec_sub · axial · 2.5mm · 0.78mm/px · z∈[-24,+214]mm · 3 of 96 slices shown]
[im 1/96]
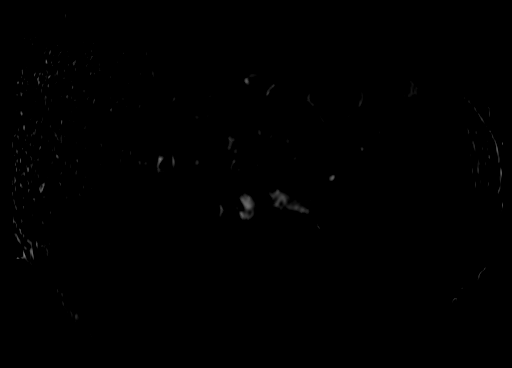
[im 48/96]
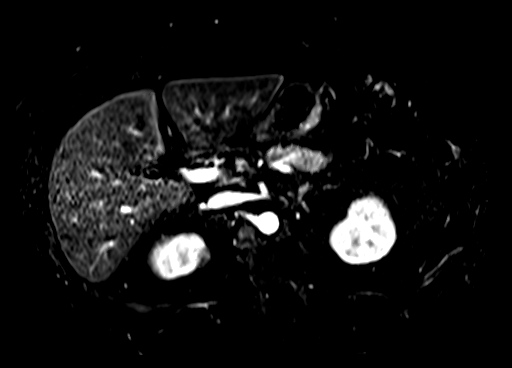
[im 96/96]
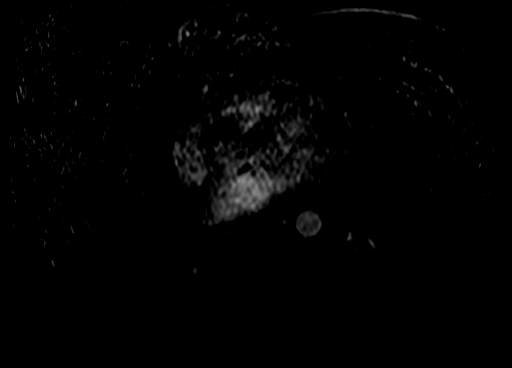

[Series 19: post 60 sec · axial · 2.5mm · 0.78mm/px · z∈[-24,+214]mm · 3 of 96 slices shown]
[im 1/96]
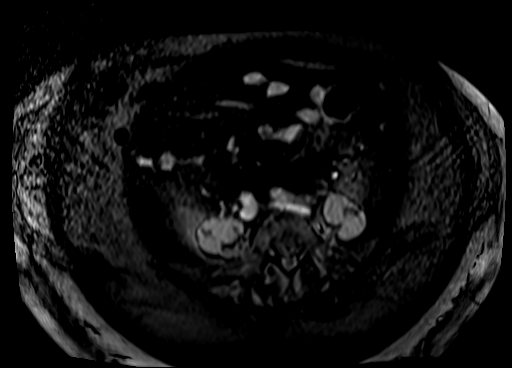
[im 48/96]
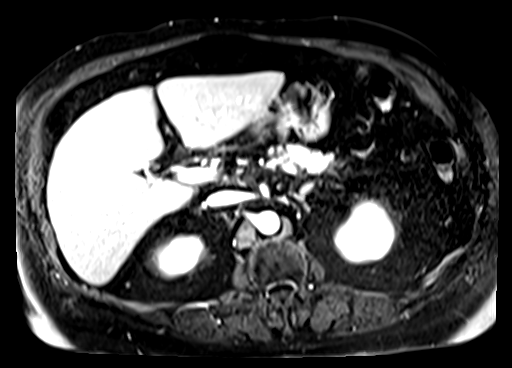
[im 96/96]
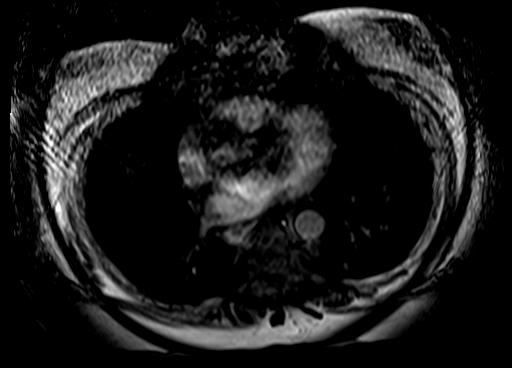

[Series 20: post 60 sec_sub · axial · 2.5mm · 0.78mm/px · z∈[-24,+214]mm · 3 of 96 slices shown]
[im 1/96]
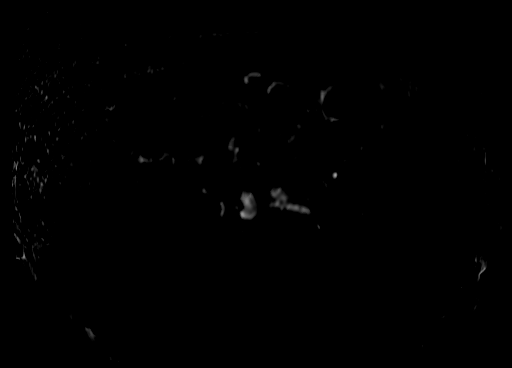
[im 48/96]
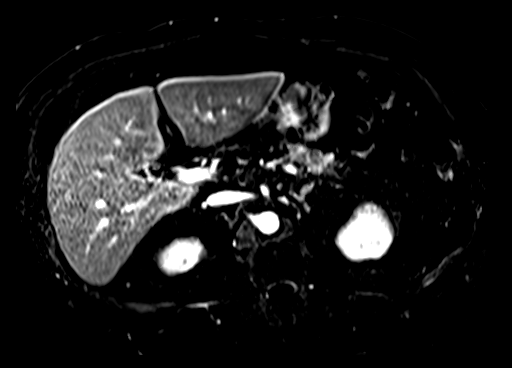
[im 96/96]
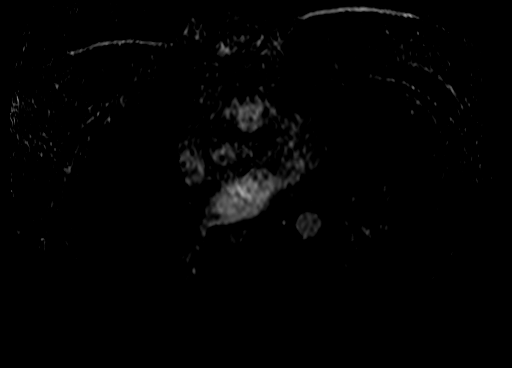

[Series 21: post 90 sec · axial · 2.5mm · 0.78mm/px · z∈[-24,+94]mm · 2 of 96 slices shown]
[im 1/96]
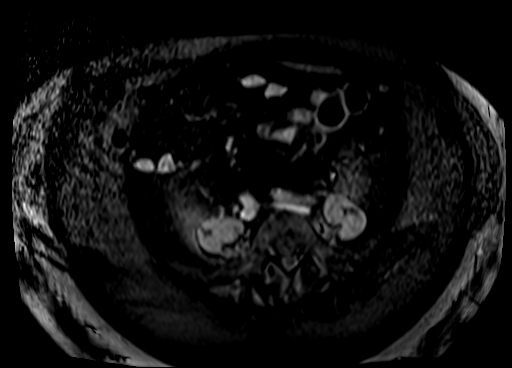
[im 48/96]
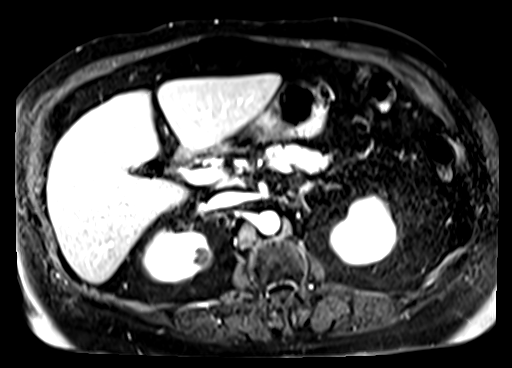

[31 of 48 positions shown; findings below may reference images not displayed]

FINDINGS: Lower chest: No acute abnormality.

Hepatobiliary: Diffuse hepatic steatosis. No suspicious hepatic
lesion. Gallbladder surgically absent. No biliary ductal dilation.

Pancreas: Stable 5 mm cystic lesion in the tail of the pancreas on
image [DATE] without suspicious postcontrast enhancement. No
pancreatic ductal dilation.

Spleen:  Within normal limits in size and appearance.

Adrenals/Urinary Tract: Bilateral adrenal glands appear normal.
No hydronephrosis.

Stomach/Bowel: Moderate-sized hiatal hernia. Pathologic dilation or
evidence of acute inflammation involving loops of large or small
bowel in the abdomen.

Vascular/Lymphatic: Normal caliber abdominal aorta. The portal,
splenic and superior mesenteric veins are patent. No pathologically
enlarged abdominal lymph nodes.

Other:  No significant abdominopelvic free fluid.

Musculoskeletal: Multilevel prior vertebral body augmentation.
Multilevel degenerative changes spine. No suspicious osseous lesion
identified.
IMPRESSION: 1. Stable 5 mm cystic lesion in the tail of the pancreas without
suspicious MRI features likely reflecting a side branch IPMN.
Recommend follow up pre and post contrast MRI/MRCP in 2 years. This
recommendation follows ACR consensus guidelines: Management of
Incidental Pancreatic Cysts: A White Paper of the ACR Incidental
Findings Committee. [HOSPITAL] [K8];[DATE].
2. Diffuse hepatic steatosis.
3. Stable 17 mm benign right upper pole angiomyolipoma.
4. Moderate-sized hiatal hernia.

## 2022-01-28 MED ORDER — GADOBENATE DIMEGLUMINE 529 MG/ML IV SOLN
20.0000 mL | Freq: Once | INTRAVENOUS | Status: AC | PRN
  Administered 2022-01-28: 20 mL via INTRAVENOUS

## 2022-04-24 ENCOUNTER — Other Ambulatory Visit: Payer: Self-pay | Admitting: Physical Medicine and Rehabilitation

## 2022-04-24 DIAGNOSIS — M4846XA Fatigue fracture of vertebra, lumbar region, initial encounter for fracture: Secondary | ICD-10-CM

## 2022-04-24 DIAGNOSIS — S065XAA Traumatic subdural hemorrhage with loss of consciousness status unknown, initial encounter: Secondary | ICD-10-CM

## 2022-05-02 ENCOUNTER — Other Ambulatory Visit: Payer: BLUE CROSS/BLUE SHIELD

## 2022-05-05 ENCOUNTER — Ambulatory Visit
Admission: RE | Admit: 2022-05-05 | Discharge: 2022-05-05 | Disposition: A | Discharge status: Home / Self Care | Payer: BLUE CROSS/BLUE SHIELD | Source: Ambulatory Visit | Attending: Physical Medicine and Rehabilitation | Admitting: Physical Medicine and Rehabilitation

## 2022-05-05 DIAGNOSIS — M4846XA Fatigue fracture of vertebra, lumbar region, initial encounter for fracture: Secondary | ICD-10-CM

## 2022-05-05 DIAGNOSIS — S065XAA Traumatic subdural hemorrhage with loss of consciousness status unknown, initial encounter: Secondary | ICD-10-CM

## 2022-08-06 ENCOUNTER — Encounter (HOSPITAL_BASED_OUTPATIENT_CLINIC_OR_DEPARTMENT_OTHER): Payer: Self-pay | Admitting: Emergency Medicine

## 2022-08-06 ENCOUNTER — Encounter (HOSPITAL_COMMUNITY): Payer: Self-pay

## 2022-08-06 ENCOUNTER — Inpatient Hospital Stay (HOSPITAL_BASED_OUTPATIENT_CLINIC_OR_DEPARTMENT_OTHER)
Admission: EM | Admit: 2022-08-06 | Discharge: 2022-08-08 | DRG: 439 | Disposition: A | Discharge status: Home / Self Care | Payer: BLUE CROSS/BLUE SHIELD | Attending: Family Medicine | Admitting: Family Medicine

## 2022-08-06 ENCOUNTER — Other Ambulatory Visit: Payer: Self-pay

## 2022-08-06 ENCOUNTER — Emergency Department (HOSPITAL_BASED_OUTPATIENT_CLINIC_OR_DEPARTMENT_OTHER): Payer: BLUE CROSS/BLUE SHIELD

## 2022-08-06 DIAGNOSIS — E669 Obesity, unspecified: Secondary | ICD-10-CM | POA: Diagnosis present

## 2022-08-06 DIAGNOSIS — Y92009 Unspecified place in unspecified non-institutional (private) residence as the place of occurrence of the external cause: Secondary | ICD-10-CM

## 2022-08-06 DIAGNOSIS — K700 Alcoholic fatty liver: Secondary | ICD-10-CM | POA: Insufficient documentation

## 2022-08-06 DIAGNOSIS — Z9049 Acquired absence of other specified parts of digestive tract: Secondary | ICD-10-CM

## 2022-08-06 DIAGNOSIS — Z7982 Long term (current) use of aspirin: Secondary | ICD-10-CM

## 2022-08-06 DIAGNOSIS — I10 Essential (primary) hypertension: Secondary | ICD-10-CM | POA: Diagnosis present

## 2022-08-06 DIAGNOSIS — G894 Chronic pain syndrome: Secondary | ICD-10-CM | POA: Diagnosis present

## 2022-08-06 DIAGNOSIS — K853 Drug induced acute pancreatitis without necrosis or infection: Principal | ICD-10-CM | POA: Diagnosis present

## 2022-08-06 DIAGNOSIS — Z6833 Body mass index (BMI) 33.0-33.9, adult: Secondary | ICD-10-CM

## 2022-08-06 DIAGNOSIS — D1771 Benign lipomatous neoplasm of kidney: Secondary | ICD-10-CM | POA: Diagnosis present

## 2022-08-06 DIAGNOSIS — J45909 Unspecified asthma, uncomplicated: Secondary | ICD-10-CM | POA: Diagnosis present

## 2022-08-06 DIAGNOSIS — M51369 Other intervertebral disc degeneration, lumbar region without mention of lumbar back pain or lower extremity pain: Secondary | ICD-10-CM | POA: Diagnosis present

## 2022-08-06 DIAGNOSIS — E66811 Obesity, class 1: Secondary | ICD-10-CM

## 2022-08-06 DIAGNOSIS — D649 Anemia, unspecified: Secondary | ICD-10-CM | POA: Diagnosis present

## 2022-08-06 DIAGNOSIS — R251 Tremor, unspecified: Secondary | ICD-10-CM | POA: Diagnosis present

## 2022-08-06 DIAGNOSIS — Z91040 Latex allergy status: Secondary | ICD-10-CM

## 2022-08-06 DIAGNOSIS — M5136 Other intervertebral disc degeneration, lumbar region: Secondary | ICD-10-CM | POA: Diagnosis present

## 2022-08-06 DIAGNOSIS — F411 Generalized anxiety disorder: Secondary | ICD-10-CM | POA: Diagnosis present

## 2022-08-06 DIAGNOSIS — N3000 Acute cystitis without hematuria: Secondary | ICD-10-CM

## 2022-08-06 DIAGNOSIS — T466X5A Adverse effect of antihyperlipidemic and antiarteriosclerotic drugs, initial encounter: Secondary | ICD-10-CM | POA: Diagnosis present

## 2022-08-06 DIAGNOSIS — E785 Hyperlipidemia, unspecified: Secondary | ICD-10-CM | POA: Diagnosis present

## 2022-08-06 DIAGNOSIS — K449 Diaphragmatic hernia without obstruction or gangrene: Secondary | ICD-10-CM | POA: Diagnosis present

## 2022-08-06 DIAGNOSIS — K760 Fatty (change of) liver, not elsewhere classified: Secondary | ICD-10-CM | POA: Diagnosis present

## 2022-08-06 DIAGNOSIS — E860 Dehydration: Secondary | ICD-10-CM | POA: Diagnosis present

## 2022-08-06 DIAGNOSIS — Z79899 Other long term (current) drug therapy: Secondary | ICD-10-CM

## 2022-08-06 DIAGNOSIS — F410 Panic disorder [episodic paroxysmal anxiety] without agoraphobia: Secondary | ICD-10-CM | POA: Diagnosis present

## 2022-08-06 DIAGNOSIS — Z888 Allergy status to other drugs, medicaments and biological substances status: Secondary | ICD-10-CM

## 2022-08-06 DIAGNOSIS — K859 Acute pancreatitis without necrosis or infection, unspecified: Principal | ICD-10-CM | POA: Diagnosis present

## 2022-08-06 DIAGNOSIS — N179 Acute kidney failure, unspecified: Secondary | ICD-10-CM | POA: Diagnosis present

## 2022-08-06 LAB — COMPREHENSIVE METABOLIC PANEL
ALT: 13 U/L (ref 0–44)
AST: 21 U/L (ref 15–41)
Albumin: 3.7 g/dL (ref 3.5–5.0)
Alkaline Phosphatase: 93 U/L (ref 38–126)
Anion gap: 9 (ref 5–15)
BUN: 14 mg/dL (ref 6–20)
CO2: 26 mmol/L (ref 22–32)
Calcium: 9 mg/dL (ref 8.9–10.3)
Chloride: 103 mmol/L (ref 98–111)
Creatinine, Ser: 1.22 mg/dL — ABNORMAL HIGH (ref 0.44–1.00)
GFR, Estimated: 51 mL/min — ABNORMAL LOW (ref >60)
Glucose, Bld: 125 mg/dL — ABNORMAL HIGH (ref 70–99)
Potassium: 3.9 mmol/L (ref 3.5–5.1)
Sodium: 138 mmol/L (ref 135–145)
Total Bilirubin: 0.4 mg/dL (ref 0.3–1.2)
Total Protein: 7.3 g/dL (ref 6.5–8.1)

## 2022-08-06 LAB — URINALYSIS, ROUTINE W REFLEX MICROSCOPIC
Bilirubin Urine: NEGATIVE
Glucose, UA: NEGATIVE mg/dL
Ketones, ur: NEGATIVE mg/dL
Nitrite: NEGATIVE
Protein, ur: 30 mg/dL — AB
Specific Gravity, Urine: 1.02 (ref 1.005–1.030)
pH: 5.5 (ref 5.0–8.0)

## 2022-08-06 LAB — CBC WITH DIFFERENTIAL/PLATELET
Abs Immature Granulocytes: 0.04 10*3/uL (ref 0.00–0.07)
Basophils Absolute: 0 10*3/uL (ref 0.0–0.1)
Basophils Relative: 0 %
Eosinophils Absolute: 0 10*3/uL (ref 0.0–0.5)
Eosinophils Relative: 0 %
HCT: 35.2 % — ABNORMAL LOW (ref 36.0–46.0)
Hemoglobin: 11.3 g/dL — ABNORMAL LOW (ref 12.0–15.0)
Immature Granulocytes: 0 %
Lymphocytes Relative: 11 %
Lymphs Abs: 1.2 10*3/uL (ref 0.7–4.0)
MCH: 29.7 pg (ref 26.0–34.0)
MCHC: 32.1 g/dL (ref 30.0–36.0)
MCV: 92.6 fL (ref 80.0–100.0)
Monocytes Absolute: 0.6 10*3/uL (ref 0.1–1.0)
Monocytes Relative: 5 %
Neutro Abs: 9.5 10*3/uL — ABNORMAL HIGH (ref 1.7–7.7)
Neutrophils Relative %: 84 %
Platelets: 425 10*3/uL — ABNORMAL HIGH (ref 150–400)
RBC: 3.8 MIL/uL — ABNORMAL LOW (ref 3.87–5.11)
RDW: 14.7 % (ref 11.5–15.5)
WBC: 11.5 10*3/uL — ABNORMAL HIGH (ref 4.0–10.5)
nRBC: 0 % (ref 0.0–0.2)

## 2022-08-06 LAB — URINALYSIS, MICROSCOPIC (REFLEX)

## 2022-08-06 LAB — ETHANOL: Alcohol, Ethyl (B): <10 mg/dL (ref <10)

## 2022-08-06 LAB — LIPASE, BLOOD: Lipase: 1973 U/L — ABNORMAL HIGH (ref 11–51)

## 2022-08-06 MED ORDER — SODIUM CHLORIDE 0.9 % IV BOLUS
1000.0000 mL | Freq: Once | INTRAVENOUS | Status: AC
  Administered 2022-08-06: 1000 mL via INTRAVENOUS

## 2022-08-06 MED ORDER — HYDROMORPHONE HCL 1 MG/ML IJ SOLN
1.0000 mg | Freq: Once | INTRAMUSCULAR | Status: AC
  Administered 2022-08-06: 1 mg via INTRAVENOUS
  Filled 2022-08-06: qty 1

## 2022-08-06 MED ORDER — HYDROMORPHONE HCL 1 MG/ML IJ SOLN
1.0000 mg | INTRAMUSCULAR | Status: DC | PRN
  Administered 2022-08-06 – 2022-08-07 (×5): 1 mg via INTRAVENOUS
  Filled 2022-08-06 (×5): qty 1

## 2022-08-06 MED ORDER — SODIUM CHLORIDE 0.9 % IV SOLN: Freq: Once | INTRAVENOUS | Status: AC

## 2022-08-06 MED ORDER — LORAZEPAM 2 MG/ML IJ SOLN
1.0000 mg | Freq: Once | INTRAMUSCULAR | Status: AC
  Administered 2022-08-06: 1 mg via INTRAVENOUS
  Filled 2022-08-06: qty 1

## 2022-08-06 MED ORDER — SODIUM CHLORIDE 0.9 % IV SOLN
1.0000 g | Freq: Once | INTRAVENOUS | Status: AC
  Administered 2022-08-06: 1 g via INTRAVENOUS
  Filled 2022-08-06: qty 10

## 2022-08-06 MED ORDER — IOHEXOL 300 MG/ML  SOLN
100.0000 mL | Freq: Once | INTRAMUSCULAR | Status: AC | PRN
  Administered 2022-08-06: 100 mL via INTRAVENOUS

## 2022-08-06 MED ORDER — ONDANSETRON HCL 4 MG/2ML IJ SOLN
4.0000 mg | Freq: Once | INTRAMUSCULAR | Status: AC
  Administered 2022-08-06: 4 mg via INTRAVENOUS
  Filled 2022-08-06: qty 2

## 2022-08-06 NOTE — ED Notes (Signed)
MD Darl Householder ok-ed clear liquid diet. Pt provided Gatorade with Pedialyte and broth.

## 2022-08-06 NOTE — ED Provider Notes (Signed)
[facility] Provider Note   CSN: [id number] Arrival date & time: 08/06/22  1551     History  Chief Complaint  Patient presents with   Abdominal Pain    [facility] Espinoza is a 61 y.o. 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  She states that she has been remodeling her bathroom and misplaced her medicine and has been taking fenofibrate for Espinoza 2 weeks now.  She states that she did not have good appetite recently and started vomiting today and has severe epigastric pain.  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 Collene Mares).  Patient states that she does not drink any alcohol.  She states that she has a known pseudocyst based on her previous MRCP earlier this year.  The history is provided by the patient.       Home Medications Prior to Admission medications   Medication Sig Start Date End Date Taking? Authorizing Provider  acetaminophen (TYLENOL) 500 MG tablet Take 1,000 mg by mouth every 8 (eight) hours as needed for mild pain or headache.    [provider]  ALPRAZolam [name] 1 MG tablet Take 0.5 mg by mouth 3 (three) times daily as needed for sleep or anxiety. 05/23/21   [provider]  aspirin EC 81 MG tablet Take 81 mg by mouth in the morning. Swallow whole.    [provider]  atorvastatin (LIPITOR) 40 MG tablet Take 40 mg by mouth daily.  06/12/16   [provider]  desvenlafaxine (PRISTIQ) 100 MG 24 hr tablet Take 100 mg by mouth daily.    [provider]  fenofibrate 160 MG tablet Take 160 mg by mouth daily.  06/10/16   [provider]  fexofenadine (ALLEGRA) 180 MG tablet Take 180 mg by mouth daily as needed for allergies or rhinitis.    [provider]  folic acid (FOLVITE) 1 MG tablet Take 1 tablet (1 mg total) by mouth daily. 05/20/21   Cristal Deer, MD  omeprazole (PRILOSEC) 40 MG  capsule Take 40 mg by mouth daily as needed (for reflux).    [provider]  potassium chloride (KLOR-CON) 10 MEQ tablet Take 1 tablet (10 mEq total) by mouth daily. 05/19/21   Cristal Deer, MD  promethazine (PHENERGAN) 25 MG tablet Take 0.5-1 tablets (12.5-25 mg total) by mouth every 8 (eight) hours as needed for nausea. 09/25/20   Davonna Belling, MD  sucralfate (CARAFATE) 1 g tablet Take 1 tablet (1 g total) by mouth 3 (three) times daily with meals. Patient taking differently: Take 1 g by mouth 3 (three) times daily as needed (to "coat the stomach"). 09/03/20   Veryl Speak, MD  thiamine 100 MG tablet Take 1 tablet (100 mg total) by mouth daily. 05/20/21   Cristal Deer, MD  tiZANidine (ZANAFLEX) 4 MG tablet Take 4 mg by mouth at bedtime as needed for muscle spasms.  04/26/20   [provider]  traMADol (ULTRAM) 50 MG tablet Take 50 mg by mouth 3 (three) times daily as needed for pain. 05/28/21   [provider]  valsartan-hydrochlorothiazide (DIOVAN-HCT) 80-12.5 MG tablet Take 1 tablet by mouth daily.    [provider]  Vitamin D, Ergocalciferol, (DRISDOL) 50000 units CAPS capsule Take 50,000 Units by mouth every Sunday. 03/10/16   [provider]  zolpidem (AMBIEN CR) 12.5 MG CR tablet Take 12.5 mg by mouth at bedtime. 07/16/16  [provider]      Allergies    Latex and Wound dressings    Review of Systems   Review of Systems  Gastrointestinal:  Positive for abdominal pain.  All other systems reviewed and are negative.   Physical Exam Updated Vital Signs BP (!) 182/78   Pulse 91   Temp 98.4 F (36.9 C) (Oral)   Resp 16   Ht '5\' 7"'$  (1.702 m)   Wt 95.3 kg   LMP 06/30/2011   SpO2 100%   BMI 32.89 kg/m  Physical Exam Vitals and nursing note reviewed.  Constitutional:      Comments: Uncomfortable  HENT:     Head: Normocephalic.     Mouth/Throat:     Pharynx: Oropharynx is clear.  Eyes:     Extraocular  Movements: Extraocular movements intact.     Pupils: Pupils are equal, round, and reactive to light.  Cardiovascular:     Rate and Rhythm: Normal rate and regular rhythm.  Pulmonary:     Breath sounds: Normal breath sounds.  Abdominal:     General: Abdomen is flat.     Comments: + epigastric tenderness   Skin:    General: Skin is warm.     Capillary Refill: Capillary refill takes less than 2 seconds.  Neurological:     General: No focal deficit present.     Mental Status: She is oriented to person, place, and time.  Psychiatric:        Mood and Affect: Mood normal.        Behavior: Behavior normal.     ED Results / Procedures / Treatments   Labs (all labs ordered are listed, but only abnormal results are displayed) Labs Reviewed  COMPREHENSIVE METABOLIC PANEL - Abnormal; Notable for the following components:      Result Value   Glucose, Bld 125 (*)    Creatinine, Ser 1.22 (*)    GFR, Estimated 51 (*)    All other components within normal limits  CBC WITH DIFFERENTIAL/PLATELET - Abnormal; Notable for the following components:   WBC 11.5 (*)    RBC 3.80 (*)    Hemoglobin 11.3 (*)    HCT 35.2 (*)    Platelets 425 (*)    Neutro Abs 9.5 (*)    All other components within normal limits  LIPASE, BLOOD  URINALYSIS, ROUTINE W REFLEX MICROSCOPIC    EKG EKG Interpretation  Date/Time:  Wednesday August 06 2022 16:00:58 EST Ventricular Rate:  97 PR Interval:  159 QRS Duration: 107 QT Interval:  353 QTC Calculation: 449 R Axis:   -11 Text Interpretation: Sinus rhythm Borderline T abnormalities, anterior leads No significant change since last tracing Confirmed by Wandra Arthurs [phone]) on 08/06/2022 4:25:11 PM  Radiology No results found.  Procedures Procedures    Medications Ordered in ED Medications  sodium chloride 0.9 % bolus 1,000 mL (0 mLs Intravenous Stopped 08/06/22 1721)  HYDROmorphone (DILAUDID) injection 1 mg (1 mg Intravenous Given 08/06/22 1615)   ondansetron (ZOFRAN) injection 4 mg (4 mg Intravenous Given 08/06/22 1614)    ED Course/ Medical Decision Making/ A&P                           Medical Decision Making [facility] Espinoza is a 61 y.o. female here presenting abdominal pain.  Patient has a known history of pancreatitis with pseudocyst.  She unfortunately took a fenofibrate which triggers her pancreatitis.  Patient has  epigastric tenderness.  Patient states that she has multiple imaging studies and would like to avoid it for now.  We will check a lipase level and CBC and CMP and hydrate and give pain medicine and Zofran and reassess.   8 pm Patient's lipase level is 2000.  LFTs are normal.  Patient is still in pain so I told her that we should get a CT abdomen pelvis to further assess for pancreatitis  10 PM CT scan showed acute pancreatitis with no complicating features.  Patient is still in pain.  I ordered pain medicine and IV fluids.  Patient will be admitted for acute pancreatitis.  Patient also has a UTI and was given IV antibiotics.  Problems Addressed: Acute cystitis without hematuria: acute illness or injury Acute pancreatitis, unspecified complication status, unspecified pancreatitis type: acute illness or injury  Amount and/or Complexity of Data Reviewed Labs: ordered. Radiology: ordered.  Risk Prescription drug management. Decision regarding hospitalization.    Final Clinical Impression(s) / ED Diagnoses Final diagnoses:  None    Rx / DC Orders ED Discharge Orders     None         Drenda Freeze, MD 08/06/22 2309

## 2022-08-06 NOTE — ED Triage Notes (Signed)
Per EMS, severe epigastric abdominal pain and n/v x 2 hours. Unremarkable EKG. Hx of pancreatitis. Zofran and fentanyl give pta.

## 2022-08-07 DIAGNOSIS — E860 Dehydration: Secondary | ICD-10-CM | POA: Diagnosis present

## 2022-08-07 DIAGNOSIS — K853 Drug induced acute pancreatitis without necrosis or infection: Secondary | ICD-10-CM | POA: Diagnosis present

## 2022-08-07 DIAGNOSIS — T466X5A Adverse effect of antihyperlipidemic and antiarteriosclerotic drugs, initial encounter: Secondary | ICD-10-CM | POA: Diagnosis present

## 2022-08-07 DIAGNOSIS — G894 Chronic pain syndrome: Secondary | ICD-10-CM | POA: Diagnosis present

## 2022-08-07 DIAGNOSIS — N179 Acute kidney failure, unspecified: Secondary | ICD-10-CM | POA: Diagnosis present

## 2022-08-07 DIAGNOSIS — R251 Tremor, unspecified: Secondary | ICD-10-CM | POA: Diagnosis present

## 2022-08-07 DIAGNOSIS — Z888 Allergy status to other drugs, medicaments and biological substances status: Secondary | ICD-10-CM | POA: Diagnosis not present

## 2022-08-07 DIAGNOSIS — Z7982 Long term (current) use of aspirin: Secondary | ICD-10-CM | POA: Diagnosis not present

## 2022-08-07 DIAGNOSIS — D1771 Benign lipomatous neoplasm of kidney: Secondary | ICD-10-CM | POA: Diagnosis present

## 2022-08-07 DIAGNOSIS — D649 Anemia, unspecified: Secondary | ICD-10-CM | POA: Diagnosis present

## 2022-08-07 DIAGNOSIS — Z6833 Body mass index (BMI) 33.0-33.9, adult: Secondary | ICD-10-CM | POA: Diagnosis not present

## 2022-08-07 DIAGNOSIS — Z91040 Latex allergy status: Secondary | ICD-10-CM | POA: Diagnosis not present

## 2022-08-07 DIAGNOSIS — J45909 Unspecified asthma, uncomplicated: Secondary | ICD-10-CM | POA: Diagnosis present

## 2022-08-07 DIAGNOSIS — E785 Hyperlipidemia, unspecified: Secondary | ICD-10-CM | POA: Diagnosis present

## 2022-08-07 DIAGNOSIS — K760 Fatty (change of) liver, not elsewhere classified: Secondary | ICD-10-CM | POA: Diagnosis present

## 2022-08-07 DIAGNOSIS — Z79899 Other long term (current) drug therapy: Secondary | ICD-10-CM | POA: Diagnosis not present

## 2022-08-07 DIAGNOSIS — E669 Obesity, unspecified: Secondary | ICD-10-CM | POA: Diagnosis present

## 2022-08-07 DIAGNOSIS — K700 Alcoholic fatty liver: Secondary | ICD-10-CM | POA: Insufficient documentation

## 2022-08-07 DIAGNOSIS — K449 Diaphragmatic hernia without obstruction or gangrene: Secondary | ICD-10-CM | POA: Diagnosis present

## 2022-08-07 DIAGNOSIS — Z9049 Acquired absence of other specified parts of digestive tract: Secondary | ICD-10-CM | POA: Diagnosis not present

## 2022-08-07 DIAGNOSIS — F411 Generalized anxiety disorder: Secondary | ICD-10-CM | POA: Diagnosis present

## 2022-08-07 DIAGNOSIS — Y92009 Unspecified place in unspecified non-institutional (private) residence as the place of occurrence of the external cause: Secondary | ICD-10-CM | POA: Diagnosis not present

## 2022-08-07 DIAGNOSIS — K859 Acute pancreatitis without necrosis or infection, unspecified: Secondary | ICD-10-CM | POA: Diagnosis present

## 2022-08-07 DIAGNOSIS — M5136 Other intervertebral disc degeneration, lumbar region: Secondary | ICD-10-CM | POA: Diagnosis present

## 2022-08-07 DIAGNOSIS — F410 Panic disorder [episodic paroxysmal anxiety] without agoraphobia: Secondary | ICD-10-CM | POA: Diagnosis present

## 2022-08-07 DIAGNOSIS — I10 Essential (primary) hypertension: Secondary | ICD-10-CM

## 2022-08-07 LAB — COMPREHENSIVE METABOLIC PANEL
ALT: 11 U/L (ref 0–44)
AST: 16 U/L (ref 15–41)
Albumin: 3.7 g/dL (ref 3.5–5.0)
Alkaline Phosphatase: 87 U/L (ref 38–126)
Anion gap: 9 (ref 5–15)
BUN: 10 mg/dL (ref 6–20)
CO2: 23 mmol/L (ref 22–32)
Calcium: 9 mg/dL (ref 8.9–10.3)
Chloride: 109 mmol/L (ref 98–111)
Creatinine, Ser: 0.85 mg/dL (ref 0.44–1.00)
GFR, Estimated: >60 mL/min (ref >60)
Glucose, Bld: 105 mg/dL — ABNORMAL HIGH (ref 70–99)
Potassium: 4.1 mmol/L (ref 3.5–5.1)
Sodium: 141 mmol/L (ref 135–145)
Total Bilirubin: 0.6 mg/dL (ref 0.3–1.2)
Total Protein: 6.9 g/dL (ref 6.5–8.1)

## 2022-08-07 LAB — LIPID PANEL
Cholesterol: 246 mg/dL — ABNORMAL HIGH (ref 0–200)
HDL: 55 mg/dL (ref >40)
LDL Cholesterol: 154 mg/dL — ABNORMAL HIGH (ref 0–99)
Total CHOL/HDL Ratio: 4.5 RATIO
Triglycerides: 187 mg/dL — ABNORMAL HIGH (ref <150)
VLDL: 37 mg/dL (ref 0–40)

## 2022-08-07 LAB — CBC WITH DIFFERENTIAL/PLATELET
Abs Immature Granulocytes: 0.01 10*3/uL (ref 0.00–0.07)
Basophils Absolute: 0 10*3/uL (ref 0.0–0.1)
Basophils Relative: 0 %
Eosinophils Absolute: 0.1 10*3/uL (ref 0.0–0.5)
Eosinophils Relative: 1 %
HCT: 34 % — ABNORMAL LOW (ref 36.0–46.0)
Hemoglobin: 11 g/dL — ABNORMAL LOW (ref 12.0–15.0)
Immature Granulocytes: 0 %
Lymphocytes Relative: 24 %
Lymphs Abs: 1.8 10*3/uL (ref 0.7–4.0)
MCH: 30.6 pg (ref 26.0–34.0)
MCHC: 32.4 g/dL (ref 30.0–36.0)
MCV: 94.4 fL (ref 80.0–100.0)
Monocytes Absolute: 0.6 10*3/uL (ref 0.1–1.0)
Monocytes Relative: 8 %
Neutro Abs: 5 10*3/uL (ref 1.7–7.7)
Neutrophils Relative %: 67 %
Platelets: 356 10*3/uL (ref 150–400)
RBC: 3.6 MIL/uL — ABNORMAL LOW (ref 3.87–5.11)
RDW: 14.9 % (ref 11.5–15.5)
WBC: 7.5 10*3/uL (ref 4.0–10.5)
nRBC: 0 % (ref 0.0–0.2)

## 2022-08-07 LAB — GLUCOSE, CAPILLARY
Glucose-Capillary: 119 mg/dL — ABNORMAL HIGH (ref 70–99)
Glucose-Capillary: 121 mg/dL — ABNORMAL HIGH (ref 70–99)
Glucose-Capillary: 117 mg/dL — ABNORMAL HIGH (ref 70–99)

## 2022-08-07 LAB — HEMOGLOBIN A1C
Hgb A1c MFr Bld: 6 % — ABNORMAL HIGH (ref 4.8–5.6)
Mean Plasma Glucose: 125.5 mg/dL

## 2022-08-07 LAB — HIV ANTIBODY (ROUTINE TESTING W REFLEX): HIV Screen 4th Generation wRfx: NONREACTIVE

## 2022-08-07 LAB — LIPASE, BLOOD: Lipase: 379 U/L — ABNORMAL HIGH (ref 11–51)

## 2022-08-07 MED ORDER — ZOLPIDEM TARTRATE 5 MG PO TABS
5.0000 mg | ORAL_TABLET | Freq: Every day | ORAL | Status: DC
  Administered 2022-08-07: 5 mg via ORAL
  Filled 2022-08-07: qty 1

## 2022-08-07 MED ORDER — HYDRALAZINE HCL 20 MG/ML IJ SOLN: 10.0000 mg | Freq: Four times a day (QID) | INTRAMUSCULAR | Status: DC | PRN

## 2022-08-07 MED ORDER — PANTOPRAZOLE SODIUM 40 MG PO TBEC
40.0000 mg | DELAYED_RELEASE_TABLET | Freq: Every day | ORAL | Status: DC
  Administered 2022-08-07 – 2022-08-08 (×2): 40 mg via ORAL
  Filled 2022-08-07 (×2): qty 1

## 2022-08-07 MED ORDER — VENLAFAXINE HCL ER 150 MG PO CP24
150.0000 mg | ORAL_CAPSULE | Freq: Every day | ORAL | Status: DC
  Administered 2022-08-07 – 2022-08-08 (×2): 150 mg via ORAL
  Filled 2022-08-07 (×2): qty 1

## 2022-08-07 MED ORDER — HYDROMORPHONE HCL 1 MG/ML IJ SOLN: 1.0000 mg | INTRAMUSCULAR | Status: DC | PRN

## 2022-08-07 MED ORDER — TIZANIDINE HCL 4 MG PO TABS: 4.0000 mg | ORAL_TABLET | Freq: Every evening | ORAL | Status: DC | PRN

## 2022-08-07 MED ORDER — SODIUM CHLORIDE 0.9% FLUSH: 3.0000 mL | Freq: Two times a day (BID) | INTRAVENOUS | Status: DC

## 2022-08-07 MED ORDER — ALPRAZOLAM 0.5 MG PO TABS
0.5000 mg | ORAL_TABLET | Freq: Three times a day (TID) | ORAL | Status: DC | PRN
  Administered 2022-08-07: 0.5 mg via ORAL
  Filled 2022-08-07: qty 1

## 2022-08-07 MED ORDER — ACETAMINOPHEN 325 MG PO TABS: 650.0000 mg | ORAL_TABLET | Freq: Four times a day (QID) | ORAL | Status: DC | PRN

## 2022-08-07 MED ORDER — MORPHINE SULFATE (PF) 2 MG/ML IV SOLN: 2.0000 mg | INTRAVENOUS | Status: DC | PRN

## 2022-08-07 MED ORDER — ENOXAPARIN SODIUM 40 MG/0.4ML IJ SOSY: 40.0000 mg | PREFILLED_SYRINGE | INTRAMUSCULAR | Status: DC

## 2022-08-07 MED ORDER — INSULIN ASPART 100 UNIT/ML IJ SOLN: 0.0000 [IU] | Freq: Every day | INTRAMUSCULAR | Status: DC

## 2022-08-07 MED ORDER — OXYCODONE-ACETAMINOPHEN 5-325 MG PO TABS
1.0000 | ORAL_TABLET | ORAL | Status: DC | PRN
  Administered 2022-08-07 (×2): 1 via ORAL
  Filled 2022-08-07 (×2): qty 1

## 2022-08-07 MED ORDER — HYDRALAZINE HCL 25 MG PO TABS: 25.0000 mg | ORAL_TABLET | Freq: Four times a day (QID) | ORAL | Status: DC | PRN

## 2022-08-07 MED ORDER — SODIUM CHLORIDE 0.9 % IV SOLN: INTRAVENOUS | Status: DC

## 2022-08-07 MED ORDER — IRBESARTAN 75 MG PO TABS
75.0000 mg | ORAL_TABLET | Freq: Every day | ORAL | Status: DC
  Administered 2022-08-07 – 2022-08-08 (×2): 75 mg via ORAL
  Filled 2022-08-07 (×2): qty 1

## 2022-08-07 MED ORDER — ASPIRIN 81 MG PO TBEC
81.0000 mg | DELAYED_RELEASE_TABLET | Freq: Every day | ORAL | Status: DC
  Administered 2022-08-07 – 2022-08-08 (×2): 81 mg via ORAL
  Filled 2022-08-07 (×2): qty 1

## 2022-08-07 MED ORDER — ENOXAPARIN SODIUM 60 MG/0.6ML IJ SOSY
50.0000 mg | PREFILLED_SYRINGE | Freq: Every day | INTRAMUSCULAR | Status: DC
  Administered 2022-08-07 – 2022-08-08 (×2): 50 mg via SUBCUTANEOUS
  Filled 2022-08-07 (×2): qty 0.6

## 2022-08-07 MED ORDER — INSULIN ASPART 100 UNIT/ML IJ SOLN
0.0000 [IU] | Freq: Three times a day (TID) | INTRAMUSCULAR | Status: DC
  Administered 2022-08-08: 1 [IU] via SUBCUTANEOUS

## 2022-08-07 MED ORDER — ACETAMINOPHEN 650 MG RE SUPP: 650.0000 mg | Freq: Four times a day (QID) | RECTAL | Status: DC | PRN

## 2022-08-07 MED ORDER — LABETALOL HCL 5 MG/ML IV SOLN
20.0000 mg | Freq: Once | INTRAVENOUS | Status: AC
  Administered 2022-08-07: 20 mg via INTRAVENOUS
  Filled 2022-08-07: qty 4

## 2022-08-07 NOTE — TOC Progression Note (Signed)
Transition of Care [facility]) - Progression Note    Patient Details  Name: BRAYLIE BADAMI MRN: [id number] Date of Birth: 22-Jun-1961  Transition of Care [facility]) CM/SW Contact  Servando Snare, [place] Phone Number: 08/07/2022, 9:46 AM  Clinical Narrative:     Transition of Care (TOC) Screening Note   Patient Details  Name: ZAIA CARRE Date of Birth: 1961/08/30   Transition of Care [facility]) CM/SW Contact:    Servando Snare, LCSW Phone Number: 08/07/2022, 9:46 AM  Patient seen previous admission for ETOH resources. Patient declined resources at that time.    Transition of Care Department [facility]) has reviewed patient and no TOC needs have been identified at this time. We will continue to monitor patient advancement through interdisciplinary progression rounds. If new patient transition needs arise, please place a TOC consult.          Expected Discharge Plan and Services                                                 Social Determinants of Health (SDOH) Interventions    Readmission Risk Interventions     No data to display

## 2022-08-07 NOTE — Progress Notes (Signed)
Received report from Deatra James, RN (ED RN at [facility]). Requested RN to give some blood pressure medicine before send patient to [facility]. Patient's recent B/P-206/94.

## 2022-08-07 NOTE — Progress Notes (Signed)
Mobility Specialist Cancellation/Refusal Note:  Pt declined mobility at this time. Pt has on going foot problems.  Will check back as schedule permits.       [facility]

## 2022-08-07 NOTE — H&P (Addendum)
History and Physical    Patient: [facility] Espinoza ZOX:[id number] DOB: 02-11-1961 DOA: 08/06/2022 DOS: the patient was seen and examined on 08/07/2022 PCP: Chesley Noon, MD  Patient coming from: Home  Chief Complaint:  Chief Complaint  Patient presents with   Abdominal Pain   HPI: [facility] Espinoza is a 61 y.o. female with medical history significant of pretension, chronic pain secondary to degenerative disc disease, generalized anxiety disorder, class I obesity with a BMI of 32.8, and a history of alcohol abuse with withdrawal seizures last alcohol intake 2021.  Patient reports that during a bathroom remodel she had misplaced her Lipitor but was able to find her fenofibrate so was taking this for the past 2 weeks.  Fortunately this patient has a history of fenofibrate induced pancreatitis.  Currently developed epigastric and left upper quadrant abdominal pain, anorexia.  And on the day of presentation to the ED developed severe epigastric pain and emesis.  In the ER patient was hemodynamically stable and somewhat hypertensive.  Tmax of 99.4.  He was mildly dehydrated with a creatinine of 1.22, lipase was elevated at 1973 was found to have an abnormal urinalysis.  Alcohol level was less than 10.  CT of the abdomen and pelvis revealed acute pancreatitis with edema but no necrosis.  Also an incidental finding of fatty liver disease.  On my evaluation of the patient she was anxious and having diffuse body tremors.  She denied recent use of alcohol but states due to her GI symptoms she has been almost 48 hours without taking her usual Xanax dosing.  Reported thirst and some hunger and continued to have epigastric and left upper quadrant pain on palpation.  We will be admitted to the medical surgical unit as an inpatient.   Review of Systems: As mentioned in the history of present illness. All other systems reviewed and are negative. Past Medical History:  Diagnosis Date   ADD (attention deficit  disorder)    Alcohol withdrawal seizure ([place])    Alcohol-induced pancreatitis    Anxiety    Arthritis    Asthma    allergy induced per patient   Disc disorder    Hyperlipidemia    Hypertension    Pneumonia    Past Surgical History:  Procedure Laterality Date   CESAREAN SECTION     CHOLECYSTECTOMY  2022   FOOT SURGERY Left    FRACTURE SURGERY Right 07/2020   R wrist    HERNIA REPAIR     KNEE ARTHROSCOPY     KYPHOPLASTY N/A 10/13/2018   Procedure: THORACIC 12 KYPHOPLASTY;  Surgeon: Phylliss Bob, MD;  Location: [facility];  Service: Orthopedics;  Laterality: N/A;   KYPHOPLASTY N/A 02/05/2021   Procedure: LUMBAR ONE  AND LUMBAR FOUR KYPHOPLASTY;  Surgeon: Phylliss Bob, MD;  Location: [place];  Service: Orthopedics;  Laterality: N/A;   Social History:  reports that she has never smoked. She has never used smokeless tobacco. She reports current alcohol use. She reports that she does not use drugs.  Allergies  Allergen Reactions   Latex Other (See Comments)    "moreso like bandages"- these cause blisters   Wound Dressings Other (See Comments)    "moreso like bandages"- these cause blisters    Family History  Problem Relation Age of Onset   Pancreatitis Brother    Seizures Neg Hx     Prior to Admission medications   Medication Sig Start Date End Date Taking? Authorizing Provider  acetaminophen (TYLENOL) 500 MG  tablet Take 1,000 mg by mouth every 8 (eight) hours as needed for mild pain or headache.    [provider]  ALPRAZolam [name] 1 MG tablet Take 0.5 mg by mouth 3 (three) times daily as needed for sleep or anxiety. 05/23/21   [provider]  aspirin EC 81 MG tablet Take 81 mg by mouth in the morning. Swallow whole.    [provider]  atorvastatin (LIPITOR) 40 MG tablet Take 40 mg by mouth daily.  06/12/16   [provider]  desvenlafaxine (PRISTIQ) 100 MG 24 hr tablet Take 100 mg by mouth daily.    [provider]  fexofenadine  (ALLEGRA) 180 MG tablet Take 180 mg by mouth daily as needed for allergies or rhinitis.    [provider]  folic acid (FOLVITE) 1 MG tablet Take 1 tablet (1 mg total) by mouth daily. 05/20/21   Cristal Deer, MD  omeprazole (PRILOSEC) 40 MG capsule Take 40 mg by mouth daily as needed (for reflux).    [provider]  potassium chloride (KLOR-CON) 10 MEQ tablet Take 1 tablet (10 mEq total) by mouth daily. 05/19/21   Cristal Deer, MD  promethazine (PHENERGAN) 25 MG tablet Take 0.5-1 tablets (12.5-25 mg total) by mouth every 8 (eight) hours as needed for nausea. 09/25/20   Davonna Belling, MD  sucralfate (CARAFATE) 1 g tablet Take 1 tablet (1 g total) by mouth 3 (three) times daily with meals. Patient taking differently: Take 1 g by mouth 3 (three) times daily as needed (to "coat the stomach"). 09/03/20   Veryl Speak, MD  thiamine 100 MG tablet Take 1 tablet (100 mg total) by mouth daily. 05/20/21   Cristal Deer, MD  tiZANidine (ZANAFLEX) 4 MG tablet Take 4 mg by mouth at bedtime as needed for muscle spasms.  04/26/20   [provider]  traMADol (ULTRAM) 50 MG tablet Take 50 mg by mouth 3 (three) times daily as needed for pain. 05/28/21   [provider]  valsartan-hydrochlorothiazide (DIOVAN-HCT) 80-12.5 MG tablet Take 1 tablet by mouth daily.    [provider]  Vitamin D, Ergocalciferol, (DRISDOL) 50000 units CAPS capsule Take 50,000 Units by mouth every Sunday. 03/10/16   [provider]  zolpidem (AMBIEN CR) 12.5 MG CR tablet Take 12.5 mg by mouth at bedtime. 07/16/16   [provider]    Physical Exam: Vitals:   08/07/22 0044 08/07/22 0056 08/07/22 0236 08/07/22 0532  BP:   (!) 174/94 (!) 178/96  Pulse: 78 78 85 92  Resp: '11 14  19  '$ Temp:   99.4 F (37.4 C) 98.5 F (36.9 C)  TempSrc:   Oral Oral  SpO2: 91% 94% 98% 99%  Weight:      Height:       Constitutional: NAD, anxious, comfortable Eyes: PERRL, lids and  conjunctivae normal ENMT: Mucous membranes are dry. Posterior pharynx clear of any exudate or lesions.Normal dentition.  Neck: normal, supple, no masses, no thyromegaly Respiratory: clear to auscultation bilaterally, no wheezing, no crackles. Normal respiratory effort. No accessory muscle use.  Room air Cardiovascular: Regular rate and rhythm, no murmurs / rubs / gallops. No extremity edema. 2+ pedal pulses. No carotid bruits.  Abdomen: Focal tenderness in the epigastrium and left upper quadrant with minimal guarding but no rebounding; no masses palpated. No hepatosplenomegaly. Bowel sounds positive.  Musculoskeletal: no clubbing / cyanosis. No joint deformity upper and lower extremities. Good ROM, no contractures. Normal muscle tone.  Skin: no rashes,  lesions, ulcers. No induration Neurologic: CN 2-12 grossly intact. Sensation intact, DTR normal. Strength 5/5 x all 4 extremities.  Psychiatric: Normal judgment and insight. Alert and oriented x 3.  anxious mood.   Data Reviewed:  Laboratory data: Sodium 138, potassium 3.9, glucose 125, CO2 26, BUN 14, creatinine 1.22, lipase 1973, LFTs are normal, GFR 51, white count 11,500 with a left shift, hemoglobin 11.3, platelets 425,000, abnormal urinalysis with trace hemoglobin trace leukocytes protein 30 and many bacteria with WBC 6-10, EtOH level <10  Diagnostic data: CT abdomen and pelvis with contrast: 1. Acute edematous pancreatitis without pancreatic necrosis or acute pancreatic collection. 2. Hepatic steatosis. 3. Small to moderate-sized hiatal hernia. 4. Stable right renal angiomyolipoma.  EKG: Sinus rhythm, QTc 499 ms no ischemic changes  Assessment and Plan: Acute medication induced pancreatitis Symptoms developed after resuming fenofibrate noting previous pancreatitis secondary to this drug Patient instructed to dispose of this drug after arrival home Allow clear liquids since having minimal pain IV hydration Follow lipase  trend Check lipid panel in the event has significant hypertriglyceridemia Glucose elevated-check hemoglobin A1c and in context of acute pancreatitis follow CBGs IV morphine for pain in context of acute kidney injury and inability to utilize NSAIDs  Acute kidney injury/dehydration Current creatinine 1.22 with a baseline of 0.91 IV fluids Follow labs Avoid offending medications  Abnormal urinalysis No symptoms-likely secondary to dehydration Obtain urine culture  Hypertension Continue ARB equivalent but hold HCTZ in context of dehydration  HLD Home Lipitor on hold  Fatty liver disease secondary to prior history of regular alcohol intake LFTs are normal  Chronic pain syndrome secondary to degenerative disc disease Home tramadol on hold Continue Zanaflex  Generalized anxiety disorder with history of panic attacks Very tremulous and may be undergoing benzodiazepine withdrawal Continue Xanax, Effexor or equivalent and Ambien  Class I obesity with BMI 32.9 Counseled regarding weight reduction   Advance Care Planning:   Code Status: Full Code   DVT prophylaxis: Lovenox  Consults: None   Family Communication: Patient only  Severity of Illness: The appropriate patient status for this patient is INPATIENT. Inpatient status is judged to be reasonable and necessary in order to provide the required intensity of service to ensure the patient's safety. The patient's presenting symptoms, physical exam findings, and initial radiographic and laboratory data in the context of their chronic comorbidities is felt to place them at high risk for further clinical deterioration. Furthermore, it is not anticipated that the patient will be medically stable for discharge from the hospital within 2 midnights of admission.   * I certify that at the point of admission it is my clinical judgment that the patient will require inpatient hospital care spanning beyond 2 midnights from the point of  admission due to high intensity of service, high risk for further deterioration and high frequency of surveillance required.*  Author: Erin Hearing, NP 08/07/2022 7:22 AM  For on call review [email or web address].

## 2022-08-08 LAB — CBC
HCT: 29.2 % — ABNORMAL LOW (ref 36.0–46.0)
Hemoglobin: 8.9 g/dL — ABNORMAL LOW (ref 12.0–15.0)
MCH: 29.9 pg (ref 26.0–34.0)
MCHC: 30.5 g/dL (ref 30.0–36.0)
MCV: 98 fL (ref 80.0–100.0)
Platelets: 320 10*3/uL (ref 150–400)
RBC: 2.98 MIL/uL — ABNORMAL LOW (ref 3.87–5.11)
RDW: 14.7 % (ref 11.5–15.5)
WBC: 5.5 10*3/uL (ref 4.0–10.5)
nRBC: 0 % (ref 0.0–0.2)

## 2022-08-08 LAB — COMPREHENSIVE METABOLIC PANEL
ALT: 10 U/L (ref 0–44)
AST: 14 U/L — ABNORMAL LOW (ref 15–41)
Albumin: 3 g/dL — ABNORMAL LOW (ref 3.5–5.0)
Alkaline Phosphatase: 70 U/L (ref 38–126)
Anion gap: 7 (ref 5–15)
BUN: 8 mg/dL (ref 6–20)
CO2: 23 mmol/L (ref 22–32)
Calcium: 8.5 mg/dL — ABNORMAL LOW (ref 8.9–10.3)
Chloride: 109 mmol/L (ref 98–111)
Creatinine, Ser: 0.69 mg/dL (ref 0.44–1.00)
GFR, Estimated: >60 mL/min (ref >60)
Glucose, Bld: 100 mg/dL — ABNORMAL HIGH (ref 70–99)
Potassium: 3.9 mmol/L (ref 3.5–5.1)
Sodium: 139 mmol/L (ref 135–145)
Total Bilirubin: 0.6 mg/dL (ref 0.3–1.2)
Total Protein: 5.6 g/dL — ABNORMAL LOW (ref 6.5–8.1)

## 2022-08-08 LAB — URINE CULTURE: Culture: NO GROWTH

## 2022-08-08 LAB — GLUCOSE, CAPILLARY
Glucose-Capillary: 126 mg/dL — ABNORMAL HIGH (ref 70–99)
Glucose-Capillary: 105 mg/dL — ABNORMAL HIGH (ref 70–99)

## 2022-08-08 MED ORDER — OXYCODONE-ACETAMINOPHEN 5-325 MG PO TABS: 1.0000 | ORAL_TABLET | Freq: Four times a day (QID) | ORAL | 0 refills | Status: AC | PRN

## 2022-08-08 NOTE — Discharge Summary (Signed)
Physician Discharge Summary   Patient: Rhonda Espinoza MRN: [id number] DOB: 07-05-1961  Admit date:     08/06/2022  Discharge date: 08/08/22  Discharge Physician: Cordelia Poche, MD   PCP: Chesley Noon, MD   Recommendations at discharge:  PCP follow-up Recheck CBC in 3-5 days  Discharge Diagnoses: Principal Problem:   Acute pancreatitis Active Problems:   DDD (degenerative disc disease), lumbar   Chronic pain syndrome   GAD (generalized anxiety disorder)   Hypertension, essential   Panic attacks   Class 1 obesity with body mass index (BMI) of 33.0 to 33.9 in adult   Fatty liver, alcoholic  Resolved Problems:   * No resolved hospital problems. [facility] Course:  Acute pancreatitis Uncomplicated. Lipase of 1,973 on admission. Triglyceride level of 187. Patient managed with bowel rest, IV fluids and analgesics with improvement of symptoms. Diet advanced to a soft diet prior to discharge. Valsartan-hydrochlorothiazide combination discontinued.  AKI Creatinine of 1.22 on admission. Resolved with IV fluids. Creatinine of 0.69 on day of discharge.  Chronic anemia Stable. Asymptomatic. . Follow-up with PCP. Recheck CBC.   Consultants: None Procedures performed: None  Disposition: Home Diet recommendation: Low fat/soft/low fiber diet  DISCHARGE MEDICATION: Allergies as of 08/08/2022       Reactions   Latex Other (See Comments)   "moreso like bandages"- these cause blisters   Wound Dressings Other (See Comments)   "moreso like bandages"- these cause blisters   Valsartan-hydrochlorothiazide Other (See Comments)   Caused pancreatitis         Medication List     STOP taking these medications    potassium chloride 10 MEQ tablet Commonly known as: KLOR-CON M       TAKE these medications    acetaminophen 500 MG tablet Commonly known as: TYLENOL Take 1,000 mg by mouth daily as needed for mild pain or headache.   ALPRAZolam 1 MG tablet Commonly known as:  XANAX Take 0.5 mg by mouth 3 (three) times daily as needed for sleep or anxiety.   aspirin EC 81 MG tablet Take 81 mg by mouth in the morning. Swallow whole.   atorvastatin 40 MG tablet Commonly known as: LIPITOR Take 40 mg by mouth daily.   busPIRone 10 MG tablet Commonly known as: BUSPAR Take 10 mg by mouth as needed (anxiety).   Cholecalciferol 1.25 MG (50000 UT) capsule Take 50,000 Units by mouth 2 (two) times a week.   desvenlafaxine 100 MG 24 hr tablet Commonly known as: PRISTIQ Take 100 mg by mouth at bedtime.   fenofibrate 160 MG tablet Take 160 mg by mouth daily.   fexofenadine 180 MG tablet Commonly known as: ALLEGRA Take 180 mg by mouth daily as needed for allergies or rhinitis.   folic acid 1 MG tablet Commonly known as: FOLVITE Take 1 tablet (1 mg total) by mouth daily.   omeprazole 40 MG capsule Commonly known as: PRILOSEC Take 40 mg by mouth daily.   ondansetron 4 MG disintegrating tablet Commonly known as: ZOFRAN-ODT Take 4 mg by mouth as needed for nausea or vomiting.   oxyCODONE-acetaminophen 5-325 MG tablet Commonly known as: PERCOCET/ROXICET Take 1 tablet by mouth every 6 (six) hours as needed for up to 3 days for moderate pain or severe pain.   sucralfate 1 g tablet Commonly known as: Carafate Take 1 tablet (1 g total) by mouth 3 (three) times daily with meals.   thiamine 100 MG tablet Commonly known as: VITAMIN B1 Take 1 tablet (100  mg total) by mouth daily.   tiZANidine 4 MG tablet Commonly known as: ZANAFLEX Take 4 mg by mouth in the morning and at bedtime.   valsartan 80 MG tablet Commonly known as: DIOVAN Take 1 tablet by mouth daily.   zolpidem 12.5 MG CR tablet Commonly known as: AMBIEN CR Take 12.5 mg by mouth at bedtime.        Follow-up Information     Chesley Noon, MD. Schedule an appointment as soon as possible for a visit in 1 week(s).   Specialty: Family Medicine Why: For hospital follow-up Contact  information: [place] [id number] [phone]                Discharge Exam: BP (!) 153/84 (BP Location: Left Arm)   Pulse 69   Temp 98.2 F (36.8 C) (Oral)   Resp 18   Ht '5\' 7"'$  (1.702 m)   Wt 99.6 kg   LMP 06/30/2011   SpO2 96%   BMI 34.39 kg/m   General exam: Appears calm and comfortable Respiratory system: Clear to auscultation. Respiratory effort normal. Cardiovascular system: S1 & S2 heard, RRR. No murmurs, rubs, gallops or clicks. Gastrointestinal system: Abdomen is nondistended, soft and nontender. No organomegaly or masses felt. Normal bowel sounds heard. Central nervous system: Alert and oriented. No focal neurological deficits. Musculoskeletal: No edema. No calf tenderness Skin: No cyanosis. No rashes Psychiatry: Judgement and insight appear normal. Mood & affect appropriate.   Condition at discharge: stable  The results of significant diagnostics from this hospitalization (including imaging, microbiology, ancillary and laboratory) are listed below for reference.   Imaging Studies: CT ABDOMEN PELVIS W CONTRAST  Result Date: 08/06/2022 CLINICAL DATA:  Pancreatitis, acute, severe Patient reports epigastric abdominal pain with nausea and vomiting. Pancreatitis history. EXAM: CT ABDOMEN AND PELVIS WITH CONTRAST TECHNIQUE: Multidetector CT imaging of the abdomen and pelvis was performed using the standard protocol following bolus administration of intravenous contrast. RADIATION DOSE REDUCTION: This exam was performed according to the departmental dose-optimization program which includes automated exposure control, adjustment of the mA and/or kV according to patient size and/or use of iterative reconstruction technique. CONTRAST:  119m OMNIPAQUE IOHEXOL 300 MG/ML  SOLN COMPARISON:  CT 05/29/2021 FINDINGS: Lower chest: 4 mm right lower lobe nodule is stable from prior exam consistent with benign process. No further follow-up is needed. Linear  atelectasis in both lower lobes. No pleural effusion. Hepatobiliary: Decreased hepatic density typical of steatosis. No focal liver lesion. Clips in the gallbladder fossa postcholecystectomy. No biliary dilatation. Pancreas: Diffuse peripancreatic fat stranding and edema. Normal pancreatic enhancement without necrosis. There is no acute pancreatic collection. No focal pancreatic mass or ductal dilatation. The splenic vein is patent. Spleen: Normal in size without focal abnormality. Adrenals/Urinary Tract: Normal adrenal glands. No hydronephrosis or perinephric edema. Homogeneous renal enhancement with symmetric excretion on delayed phase imaging. 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 No specific imaging follow-up is needed. No renal calculi. Urinary bladder is partially distended without wall thickening. Stomach/Bowel: Small to moderate-sized hiatal hernia. Stomach is nondistended. There is no small bowel obstruction or inflammation. Small volume of stool in the proximal colon, the left colon is decompressed. Normal appendix. Vascular/Lymphatic: Mild aortic atherosclerosis. No aneurysm. The superior mesenteric artery is patent. The portal, superior mesenteric and splenic veins are patent. There a few prominent peripancreatic nodes are likely reactive. No enlarged lymph nodes in the abdomen or pelvis. Reproductive: Stable 4  cm right fundal fibroid. No adnexal mass. Other: Inflammatory changes centered on the pancreas with small amount of adjacent free fluid. Choose fluid in the left anterior pararenal space. No free air or focal fluid collection. No abdominal wall hernia, question prior umbilical hernia repair. Musculoskeletal: Kyphoplasty within T12, L1 and L4. Scoliosis and degenerative change in the spine. Stable sclerotic focus within T8. 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 No acute osseous findings. IMPRESSION: 1. Acute edematous  pancreatitis without pancreatic necrosis or acute pancreatic collection. 2. Hepatic steatosis. 3. Small to moderate-sized hiatal hernia. 4. Stable right renal angiomyolipoma. Aortic Atherosclerosis (ICD10-I70.0). Electronically Signed   By: Keith Rake M.D.   On: 08/06/2022 20:04    Microbiology: Results for orders placed or performed during the hospital encounter of 08/06/22  Urine Culture     Status: None   Collection Time: 08/07/22  9:44 AM   Specimen: Urine, Clean Catch  Result Value Ref Range Status   Specimen Description   Final    URINE, CLEAN CATCH Performed at [facility], [place] [address] [place], [place] [address]    Special Requests   Final    NONE Performed at [facility], [place] [address], [place], [place] [address]    Culture   Final    NO GROWTH Performed at [facility], [place] [address] [place], [place] [address]    Report Status 08/08/2022 FINAL  Final    Labs: CBC: Recent Labs  Lab 08/06/22 1600 08/07/22 0918 08/08/22 0411  WBC 11.5* 7.5 5.5  NEUTROABS 9.5* 5.0  --   HGB 11.3* 11.0* 8.9*  HCT 35.2* 34.0* 29.2*  MCV 92.6 94.4 98.0  PLT 425* 356 945   Basic Metabolic Panel: Recent Labs  Lab 08/06/22 1600 08/07/22 0918 08/08/22 0411  NA 138 141 139  K 3.9 4.1 3.9  CL 103 109 109  CO2 '26 23 23  '$ GLUCOSE 125* 105* 100*  BUN '14 10 8  '$ CREATININE 1.22* 0.85 0.69  CALCIUM 9.0 9.0 8.5*   Liver Function Tests: Recent Labs  Lab 08/06/22 1600 08/07/22 0918 08/08/22 0411  AST 21 16 14*  ALT '13 11 10  '$ ALKPHOS 93 87 70  BILITOT 0.4 0.6 0.6  PROT 7.3 6.9 5.6*  ALBUMIN 3.7 3.7 3.0*   CBG: Recent Labs  Lab 08/07/22 1124 08/07/22 1649 08/07/22 2124 08/08/22 0713 08/08/22 1127  GLUCAP 119* 121* 117* 126* 105*    Discharge time spent: 35 minutes.  Signed: Cordelia Poche, MD [facility] 08/08/2022

## 2022-08-08 NOTE — Progress Notes (Signed)
Reviewed written d/c instructions w pt and all questions answered. She verbalized understanding. D/C via w/c w all belongings in stable condition. 

## 2022-08-08 NOTE — Discharge Instructions (Addendum)
Rhonda Espinoza,  You were in the hospital with acute pancreatitis. This appears to be related to one of your blood pressure medications (valsartan-hydrochlorothiazide) for which you've had trouble in the past. Please continue to limit your fat intake and fiber intake. Your hemoglobin was slightly low, but thankfully you are not symptomatic. This appears to be a chronic issue. Please follow-up with your primary care physician.

## 2023-01-13 ENCOUNTER — Emergency Department (HOSPITAL_BASED_OUTPATIENT_CLINIC_OR_DEPARTMENT_OTHER): Payer: BC Managed Care – PPO

## 2023-01-13 ENCOUNTER — Other Ambulatory Visit: Payer: Self-pay

## 2023-01-13 ENCOUNTER — Encounter (HOSPITAL_BASED_OUTPATIENT_CLINIC_OR_DEPARTMENT_OTHER): Payer: Self-pay | Admitting: Emergency Medicine

## 2023-01-13 ENCOUNTER — Inpatient Hospital Stay (HOSPITAL_BASED_OUTPATIENT_CLINIC_OR_DEPARTMENT_OTHER)
Admission: EM | Admit: 2023-01-13 | Discharge: 2023-01-15 | DRG: 439 | Disposition: A | Discharge status: Home / Self Care | Payer: BC Managed Care – PPO | Attending: Internal Medicine | Admitting: Internal Medicine

## 2023-01-13 DIAGNOSIS — J45909 Unspecified asthma, uncomplicated: Secondary | ICD-10-CM | POA: Diagnosis present

## 2023-01-13 DIAGNOSIS — R1013 Epigastric pain: Secondary | ICD-10-CM | POA: Diagnosis not present

## 2023-01-13 DIAGNOSIS — Z9049 Acquired absence of other specified parts of digestive tract: Secondary | ICD-10-CM

## 2023-01-13 DIAGNOSIS — Z888 Allergy status to other drugs, medicaments and biological substances status: Secondary | ICD-10-CM

## 2023-01-13 DIAGNOSIS — Z7982 Long term (current) use of aspirin: Secondary | ICD-10-CM

## 2023-01-13 DIAGNOSIS — Z91040 Latex allergy status: Secondary | ICD-10-CM

## 2023-01-13 DIAGNOSIS — I10 Essential (primary) hypertension: Secondary | ICD-10-CM | POA: Diagnosis present

## 2023-01-13 DIAGNOSIS — E872 Acidosis, unspecified: Secondary | ICD-10-CM | POA: Diagnosis present

## 2023-01-13 DIAGNOSIS — Z79899 Other long term (current) drug therapy: Secondary | ICD-10-CM

## 2023-01-13 DIAGNOSIS — D638 Anemia in other chronic diseases classified elsewhere: Secondary | ICD-10-CM | POA: Diagnosis present

## 2023-01-13 DIAGNOSIS — F909 Attention-deficit hyperactivity disorder, unspecified type: Secondary | ICD-10-CM | POA: Diagnosis present

## 2023-01-13 DIAGNOSIS — S82892D Other fracture of left lower leg, subsequent encounter for closed fracture with routine healing: Secondary | ICD-10-CM

## 2023-01-13 DIAGNOSIS — I959 Hypotension, unspecified: Secondary | ICD-10-CM | POA: Diagnosis not present

## 2023-01-13 DIAGNOSIS — K853 Drug induced acute pancreatitis without necrosis or infection: Secondary | ICD-10-CM | POA: Diagnosis not present

## 2023-01-13 DIAGNOSIS — M48061 Spinal stenosis, lumbar region without neurogenic claudication: Secondary | ICD-10-CM | POA: Diagnosis present

## 2023-01-13 DIAGNOSIS — E861 Hypovolemia: Secondary | ICD-10-CM | POA: Diagnosis present

## 2023-01-13 DIAGNOSIS — F1011 Alcohol abuse, in remission: Secondary | ICD-10-CM | POA: Diagnosis present

## 2023-01-13 DIAGNOSIS — K859 Acute pancreatitis without necrosis or infection, unspecified: Principal | ICD-10-CM | POA: Diagnosis present

## 2023-01-13 DIAGNOSIS — R001 Bradycardia, unspecified: Secondary | ICD-10-CM | POA: Diagnosis present

## 2023-01-13 DIAGNOSIS — K861 Other chronic pancreatitis: Secondary | ICD-10-CM | POA: Diagnosis present

## 2023-01-13 DIAGNOSIS — E871 Hypo-osmolality and hyponatremia: Secondary | ICD-10-CM | POA: Diagnosis present

## 2023-01-13 DIAGNOSIS — E785 Hyperlipidemia, unspecified: Secondary | ICD-10-CM | POA: Diagnosis present

## 2023-01-13 DIAGNOSIS — E781 Pure hyperglyceridemia: Secondary | ICD-10-CM | POA: Diagnosis present

## 2023-01-13 LAB — URINALYSIS, ROUTINE W REFLEX MICROSCOPIC
Bilirubin Urine: NEGATIVE
Glucose, UA: NEGATIVE mg/dL
Hgb urine dipstick: NEGATIVE
Ketones, ur: NEGATIVE mg/dL
Leukocytes,Ua: NEGATIVE
Nitrite: NEGATIVE
Protein, ur: NEGATIVE mg/dL
Specific Gravity, Urine: 1.036 — ABNORMAL HIGH (ref 1.005–1.030)
pH: 6 (ref 5.0–8.0)

## 2023-01-13 LAB — CBC
HCT: 34.6 % — ABNORMAL LOW (ref 36.0–46.0)
Hemoglobin: 11.6 g/dL — ABNORMAL LOW (ref 12.0–15.0)
MCH: 30.9 pg (ref 26.0–34.0)
MCHC: 33.5 g/dL (ref 30.0–36.0)
MCV: 92.3 fL (ref 80.0–100.0)
Platelets: 395 10*3/uL (ref 150–400)
RBC: 3.75 MIL/uL — ABNORMAL LOW (ref 3.87–5.11)
RDW: 15.6 % — ABNORMAL HIGH (ref 11.5–15.5)
WBC: 7.6 10*3/uL (ref 4.0–10.5)
nRBC: 0 % (ref 0.0–0.2)

## 2023-01-13 LAB — LIPASE, BLOOD: Lipase: 1839 U/L — ABNORMAL HIGH (ref 11–51)

## 2023-01-13 LAB — COMPREHENSIVE METABOLIC PANEL WITH GFR
ALT: 15 U/L (ref 0–44)
AST: 15 U/L (ref 15–41)
Albumin: 4.2 g/dL (ref 3.5–5.0)
Alkaline Phosphatase: 90 U/L (ref 38–126)
Anion gap: 13 (ref 5–15)
BUN: 8 mg/dL (ref 8–23)
CO2: 21 mmol/L — ABNORMAL LOW (ref 22–32)
Calcium: 9.1 mg/dL (ref 8.9–10.3)
Chloride: 100 mmol/L (ref 98–111)
Creatinine, Ser: 0.68 mg/dL (ref 0.44–1.00)
Glucose, Bld: 121 mg/dL — ABNORMAL HIGH (ref 70–99)
Potassium: 3.7 mmol/L (ref 3.5–5.1)
Sodium: 134 mmol/L — ABNORMAL LOW (ref 135–145)
Total Bilirubin: 0.4 mg/dL (ref 0.3–1.2)
Total Protein: 6.5 g/dL (ref 6.5–8.1)

## 2023-01-13 LAB — TROPONIN I (HIGH SENSITIVITY)
Troponin I (High Sensitivity): <2 ng/L (ref <18)
Troponin I (High Sensitivity): <2 ng/L (ref <18)

## 2023-01-13 MED ORDER — HYDROMORPHONE HCL 1 MG/ML IJ SOLN
1.0000 mg | Freq: Once | INTRAMUSCULAR | Status: AC
  Administered 2023-01-13: 1 mg via INTRAVENOUS
  Filled 2023-01-13: qty 1

## 2023-01-13 MED ORDER — LACTATED RINGERS IV BOLUS
1000.0000 mL | Freq: Once | INTRAVENOUS | Status: AC
  Administered 2023-01-13: 1000 mL via INTRAVENOUS

## 2023-01-13 MED ORDER — LACTATED RINGERS IV SOLN: INTRAVENOUS | Status: DC

## 2023-01-13 MED ORDER — OXYCODONE HCL 5 MG PO TABS
5.0000 mg | ORAL_TABLET | Freq: Four times a day (QID) | ORAL | Status: DC | PRN
  Administered 2023-01-14 – 2023-01-15 (×3): 5 mg via ORAL
  Filled 2023-01-13 (×3): qty 1

## 2023-01-13 MED ORDER — ACETAMINOPHEN 500 MG PO TABS: 500.0000 mg | ORAL_TABLET | Freq: Four times a day (QID) | ORAL | Status: DC | PRN

## 2023-01-13 MED ORDER — SIMETHICONE 80 MG PO CHEW
80.0000 mg | CHEWABLE_TABLET | Freq: Four times a day (QID) | ORAL | Status: AC
  Administered 2023-01-13 – 2023-01-14 (×4): 80 mg via ORAL
  Filled 2023-01-13 (×4): qty 1

## 2023-01-13 MED ORDER — POLYETHYLENE GLYCOL 3350 17 G PO PACK: 17.0000 g | PACK | Freq: Every day | ORAL | Status: DC | PRN

## 2023-01-13 MED ORDER — KETOROLAC TROMETHAMINE 15 MG/ML IJ SOLN
15.0000 mg | Freq: Once | INTRAMUSCULAR | Status: AC
  Administered 2023-01-13: 15 mg via INTRAVENOUS
  Filled 2023-01-13: qty 1

## 2023-01-13 MED ORDER — BUSPIRONE HCL 5 MG PO TABS
10.0000 mg | ORAL_TABLET | Freq: Two times a day (BID) | ORAL | Status: DC | PRN
  Filled 2023-01-13: qty 2

## 2023-01-13 MED ORDER — MELATONIN 5 MG PO TABS
5.0000 mg | ORAL_TABLET | Freq: Every evening | ORAL | Status: DC | PRN
  Administered 2023-01-14: 5 mg via ORAL
  Filled 2023-01-13: qty 1

## 2023-01-13 MED ORDER — ONDANSETRON HCL 4 MG/2ML IJ SOLN
4.0000 mg | Freq: Once | INTRAMUSCULAR | Status: AC
  Administered 2023-01-13: 4 mg via INTRAVENOUS
  Filled 2023-01-13: qty 2

## 2023-01-13 MED ORDER — ASPIRIN 81 MG PO TBEC
81.0000 mg | DELAYED_RELEASE_TABLET | Freq: Every day | ORAL | Status: DC
  Administered 2023-01-14 – 2023-01-15 (×2): 81 mg via ORAL
  Filled 2023-01-13 (×2): qty 1

## 2023-01-13 MED ORDER — PROCHLORPERAZINE EDISYLATE 10 MG/2ML IJ SOLN: 5.0000 mg | Freq: Four times a day (QID) | INTRAMUSCULAR | Status: DC | PRN

## 2023-01-13 MED ORDER — SENNOSIDES-DOCUSATE SODIUM 8.6-50 MG PO TABS
1.0000 | ORAL_TABLET | Freq: Every day | ORAL | Status: DC
  Filled 2023-01-13: qty 1

## 2023-01-13 MED ORDER — ENOXAPARIN SODIUM 40 MG/0.4ML IJ SOSY
40.0000 mg | PREFILLED_SYRINGE | INTRAMUSCULAR | Status: DC
  Administered 2023-01-14 – 2023-01-15 (×2): 40 mg via SUBCUTANEOUS
  Filled 2023-01-13 (×2): qty 0.4

## 2023-01-13 MED ORDER — IOHEXOL 300 MG/ML  SOLN
100.0000 mL | Freq: Once | INTRAMUSCULAR | Status: AC | PRN
  Administered 2023-01-13: 85 mL via INTRAVENOUS

## 2023-01-13 MED ORDER — HYDROMORPHONE HCL 1 MG/ML IJ SOLN
0.5000 mg | INTRAMUSCULAR | Status: AC | PRN
  Administered 2023-01-13 – 2023-01-14 (×4): 0.5 mg via INTRAVENOUS
  Filled 2023-01-13 (×4): qty 0.5

## 2023-01-13 MED ORDER — METOCLOPRAMIDE HCL 5 MG/ML IJ SOLN
10.0000 mg | Freq: Once | INTRAMUSCULAR | Status: AC
  Administered 2023-01-13: 10 mg via INTRAVENOUS
  Filled 2023-01-13: qty 2

## 2023-01-13 MED ORDER — GABAPENTIN 300 MG PO CAPS
300.0000 mg | ORAL_CAPSULE | Freq: Every day | ORAL | Status: DC
  Administered 2023-01-13 – 2023-01-14 (×2): 300 mg via ORAL
  Filled 2023-01-13 (×2): qty 1

## 2023-01-13 MED ORDER — QUETIAPINE FUMARATE 100 MG PO TABS
100.0000 mg | ORAL_TABLET | Freq: Every day | ORAL | Status: DC
  Administered 2023-01-13 – 2023-01-14 (×2): 100 mg via ORAL
  Filled 2023-01-13 (×2): qty 1

## 2023-01-13 MED ORDER — PANTOPRAZOLE SODIUM 40 MG PO TBEC
40.0000 mg | DELAYED_RELEASE_TABLET | Freq: Every day | ORAL | Status: DC
  Administered 2023-01-14 – 2023-01-15 (×2): 40 mg via ORAL
  Filled 2023-01-13 (×2): qty 1

## 2023-01-13 NOTE — ED Notes (Signed)
[facility] at bedside to transport pt to [facility] 

## 2023-01-13 NOTE — ED Notes (Signed)
ED Provider at bedside. 

## 2023-01-13 NOTE — ED Notes (Signed)
Attempted report x1, receiving RN to call back.  

## 2023-01-13 NOTE — ED Triage Notes (Signed)
Pt arrives to ED with c/o abdominal pain. She notes hx of chronic pancreatitis. She notes the pain worsened today. The generalized with radiation to her back. She notes x2 syncopal episodes over the past weekend.

## 2023-01-13 NOTE — Plan of Care (Signed)
62 year old female with past medical history of alcohol induced pancreatitis, alcohol withdrawal seizure, anxiety, arthritis, asthma, hypertension, hyperlipidemia presented to the ED with complaints of epigastric abdominal pain radiating to her back associated with nausea.  Reported syncopal episodes over the past weekend.  Vital signs stable on arrival to the ED.  Labs showing no leukocytosis, hemoglobin 11.6 (stable), sodium 134, normal LFTs, lipase 1839, troponin negative x 2.  CT abdomen pelvis with contrast: "IMPRESSION: 1. Changes of diffuse interstitial pancreatitis with fairly marked inflammation/edema in and around the pancreas. No findings to suggest pancreatic necrosis. No hemorrhage/hematoma. 2. Mild inflammation of the second and third portions of the duodenum. 3. Status post cholecystectomy. No biliary dilatation. 4. Moderate to large hiatal hernia.   Aortic Atherosclerosis (ICD10-I70.0)."  Patient received multiple doses of IV Dilaudid along with IV Toradol, Reglan, Zofran, and 1 L LR bolus in the ED.  [facility] will assume care on arrival to accepting facility. Until arrival, care as per EDP. However, [facility] available 24/7 for questions and assistance.  Nursing staff, please page [facility] Admits and Consults [phone]) as soon as the patient arrives to the hospital.

## 2023-01-13 NOTE — ED Provider Notes (Signed)
[facility] EMERGENCY DEPARTMENT AT [facility] Provider Note   CSN: [id number] Arrival date & time: 01/13/23  1349     History  Chief Complaint  Patient presents with   Abdominal Pain    Rhonda Espinoza is a 62 y.o. female.   Abdominal Pain    Patient with medical history of ADHD, alcohol induced pancreatitis, hypertension, hyperlipidemia, lumbar stenosis/DDD, history of cholecystectomy, C-section, hernia repair presents to the emergency department due to abdominal pain.  Patient states it started 5 days ago over the weekend initially with nausea and epigastric pain, she is been unable to eat due to the nausea but denies any vomiting.  The pain is constant, radiates to her back.  Denies any chest pain or shortness of breath, she does report a syncopal episode over the weekend but states she has been followed by cardiology for syncopal episodes and has had many throughout the year and this felt identical.  She denies any dizziness, headache, loss of vision or lateralized weakness or numbness.  Denies any alcohol use, no changes in her medication.  Home Medications Prior to Admission medications   Medication Sig Start Date End Date Taking? Authorizing Provider  amLODipine (NORVASC) 5 MG tablet Take 5 mg by mouth daily. 10/24/22  Yes [provider]  gabapentin (NEURONTIN) 300 MG capsule Take 300 mg by mouth at bedtime. 12/15/22  Yes [provider]  metoprolol succinate (TOPROL-XL) 25 MG 24 hr tablet Take 25 mg by mouth daily. 12/02/22  Yes [provider]  QUEtiapine (SEROQUEL) 100 MG tablet Take 100 mg by mouth at bedtime. 01/06/23  Yes [provider]  acetaminophen (TYLENOL) 500 MG tablet Take 1,000 mg by mouth daily as needed for mild pain or headache.    [provider]  ALPRAZolam Prudy Feeler) 1 MG tablet Take 0.5 mg by mouth 3 (three) times daily as needed for sleep or anxiety. 05/23/21   [provider]  aspirin EC 81 MG tablet  Take 81 mg by mouth in the morning. Swallow whole.    [provider]  atorvastatin (LIPITOR) 40 MG tablet Take 40 mg by mouth daily.  06/12/16   [provider]  busPIRone (BUSPAR) 10 MG tablet Take 10 mg by mouth as needed (anxiety). 08/01/22 08/01/23  [provider]  Cholecalciferol 1.25 MG (50000 UT) capsule Take 50,000 Units by mouth 2 (two) times a week. 07/28/22   [provider]  desvenlafaxine (PRISTIQ) 100 MG 24 hr tablet Take 100 mg by mouth at bedtime.    [provider]  fenofibrate 160 MG tablet Take 160 mg by mouth daily.    [provider]  fexofenadine (ALLEGRA) 180 MG tablet Take 180 mg by mouth daily as needed for allergies or rhinitis.    [provider]  folic acid (FOLVITE) 1 MG tablet Take 1 tablet (1 mg total) by mouth daily. Patient not taking: Reported on 08/07/2022 05/20/21   [name], MD  meloxicam (MOBIC) 7.5 MG tablet Take 7.5 mg by mouth every morning. May take a second dose in evening if needed    [provider]  omeprazole (PRILOSEC) 40 MG capsule Take 40 mg by mouth daily.    [provider]  ondansetron (ZOFRAN-ODT) 4 MG disintegrating tablet Take 4 mg by mouth as needed for nausea or vomiting. Patient not taking: Reported on 08/07/2022    [provider]  sucralfate (CARAFATE) 1 g tablet Take 1 tablet (1 g total) by mouth 3 (three)  times daily with meals. Patient not taking: Reported on 08/07/2022 09/03/20   Geoffery Lyons, MD  Teriparatide, Recombinant, 600 MCG/2.4ML SOPN Inject into the skin.    [provider]  thiamine 100 MG tablet Take 1 tablet (100 mg total) by mouth daily. Patient not taking: Reported on 08/07/2022 05/20/21   [name], MD  tiZANidine (ZANAFLEX) 4 MG tablet Take 4 mg by mouth in the morning and at bedtime. 04/26/20   [provider]  valsartan (DIOVAN) 80 MG tablet Take 1 tablet by mouth daily. Patient not taking: Reported on  08/07/2022 07/18/22   [provider]  zolpidem (AMBIEN CR) 12.5 MG CR tablet Take 12.5 mg by mouth at bedtime. 07/16/16   [provider]      Allergies    Latex, Wound dressings, and Valsartan-hydrochlorothiazide    Review of Systems   Review of Systems  Gastrointestinal:  Positive for abdominal pain.    Physical Exam Updated Vital Signs BP (!) 157/88   Pulse 78   Temp 98 F (36.7 C) (Oral)   Resp 17   Ht  (1.702 m)   Wt 95.3 kg   LMP 06/30/2011   SpO2 99%   BMI 32.89 kg/m  Physical Exam Vitals and nursing note reviewed. Exam conducted with a chaperone present.  Constitutional:      Appearance: Normal appearance.  HENT:     Head: Normocephalic and atraumatic.  Eyes:     General: No scleral icterus.       Right eye: No discharge.        Left eye: No discharge.     Extraocular Movements: Extraocular movements intact.     Pupils: Pupils are equal, round, and reactive to light.  Cardiovascular:     Rate and Rhythm: Normal rate and regular rhythm.     Pulses: Normal pulses.     Heart sounds: Normal heart sounds. No murmur heard.    No friction rub. No gallop.  Pulmonary:     Effort: Pulmonary effort is normal. No respiratory distress.     Breath sounds: Normal breath sounds.  Abdominal:     General: Abdomen is flat. Bowel sounds are normal. There is no distension.     Palpations: Abdomen is soft.     Tenderness: There is abdominal tenderness in the epigastric area.  Musculoskeletal:     Comments: Left lower extremity in splint.  Skin:    General: Skin is warm and dry.     Coloration: Skin is not jaundiced.  Neurological:     Mental Status: She is alert. Mental status is at baseline.     Coordination: Coordination normal.     ED Results / Procedures / Treatments   Labs (all labs ordered are listed, but only abnormal results are displayed) Labs Reviewed  LIPASE, BLOOD - Abnormal; Notable for the following components:      Result Value    Lipase 1,839 (*)    All other components within normal limits  COMPREHENSIVE METABOLIC PANEL - Abnormal; Notable for the following components:   Sodium 134 (*)    CO2 21 (*)    Glucose, Bld 121 (*)    All other components within normal limits  CBC - Abnormal; Notable for the following components:   RBC 3.75 (*)    Hemoglobin 11.6 (*)    HCT 34.6 (*)    RDW 15.6 (*)    All other components within normal limits  URINALYSIS, ROUTINE W REFLEX MICROSCOPIC  TRIGLYCERIDES  TROPONIN I (HIGH SENSITIVITY)  TROPONIN I (HIGH SENSITIVITY)    EKG EKG Interpretation  Date/Time:  Tuesday January 13 2023 14:11:05 EDT Ventricular Rate:  78 PR Interval:  166 QRS Duration: 76 QT Interval:  416 QTC Calculation: 474 R Axis:   30 Text Interpretation: Normal sinus rhythm Inferior infarct , age undetermined Abnormal ECG When compared with ECG of 06-Aug-2022 16:00, PREVIOUS ECG IS PRESENT Confirmed by Ernie Avena ([id number]) on 01/13/2023 3:29:15 PM  Radiology No results found.  Procedures Procedures    Medications Ordered in ED Medications  metoCLOPramide (REGLAN) injection 10 mg (has no administration in time range)  HYDROmorphone (DILAUDID) injection 1 mg (has no administration in time range)  lactated ringers bolus 1,000 mL (1,000 mLs Intravenous New Bag/Given 01/13/23 1541)  HYDROmorphone (DILAUDID) injection 1 mg (1 mg Intravenous Given 01/13/23 1537)  ondansetron (ZOFRAN) injection 4 mg (4 mg Intravenous Given 01/13/23 1536)    ED Course/ Medical Decision Making/ A&P Clinical Course as of 01/13/23 1557  Tue Jan 13, 2023  1528 Lipase(!): 1,839 [JL]    Clinical Course User Index [JL] Ernie Avena, MD                             Medical Decision Making Amount and/or Complexity of Data Reviewed Labs: ordered. Decision-making details documented in ED Course. Radiology: ordered.  Risk Prescription drug management. Decision regarding hospitalization.   Patient presents to the  emergency department due to epigastric abdominal pain.  Differential includes pancreatitis, gastritis, AAA, ACS, biliary colic, perforated ulcer, colitis.  Patient also mentions syncope but she is being followed for this outpatient.  She had no prodromal chest pain or shortness of breath, she is not hypoxic, regular rate and rhythm without any irregularity on cardiac monitoring and EKG shows sinus rhythm without any ischemia, WPW or Brugada.  She did have orthopedic surgery in March, despite that she is not having any pleuritic chest pain, not hypoxic not tachycardic and lower suspicion for PE.  Well score is low.  Last echo in February of this year with an EF 55 to 60%.  CBC without leukocytosis, very mild anemia hemoglobin 11.6.  CMP without gross electro derangement, AKI or transaminitis.  Troponin is low at 2, given this in the absence of chest pain lower suspicion for ACS.  Lipase is elevated at 1839, given this and epigastric abdominal pain higher suspicion for pancreatitis.  Will check triglycerides  CT abdomen ordered given pain is slightly different than previous episodes of pancreatitis and there is no obvious trigger she denies alcohol, medication changes.  It is notable for pancreatitis, some colitis.  I discussed with the patient, she has had 3 mg of Dilaudid no improvement of the pain, nausea has improved.  I will consult the hospitalist for admission for pancreatitis.        Final Clinical Impression(s) / ED Diagnoses Final diagnoses:  None    Rx / DC Orders ED Discharge Orders     None         Theron Arista, Cordelia Poche 01/13/23 2203    Ernie Avena, MD 01/13/23 2229

## 2023-01-13 NOTE — H&P (Incomplete)
History and Physical  [name]:[id number] DOB: Jan 18, 1961 DOA: 01/13/2023  Referring physician: Accepted by Dr. Loney Loh [facility], Hospitalist service.  PCP: Eartha Inch, MD  Outpatient Specialists: Cardiology, orthopedic surgery, general surgery. Patient coming from: Home via [facility]  Chief Complaint:   Epigastric pain.  HPI: Rhonda Espinoza is a 62 y.o. female with medical history significant for prior alcohol induced pancreatitis, recurrent pancreatitis, hypertension, hyperlipidemia, lumbar stenosis, history of cholecystectomy, C-section, hernia repair, recent left ankle surgery 11/28/22 at [facility] by Dr. Karie Soda, who initially presented to [facility] ED with complaints of epigastric pain for the past 5 days.  Progressed over the weekend and now with nausea.  No vomiting.  The pain is constant and radiates to her back.  Endorses a syncopal episode over the weekend and states that she has been followed by cardiology due to recurrent syncopal episodes.  No reported dizziness, loss of vision, headaches, or focal neurological symptoms.  No recent use of alcohol, denies use of alcohol for at least 2 years.  In the ED, lipase level elevated greater than 1800.  Findings suggestive of acute pancreatitis on CT abdomen and pelvis, with contrast done in the ED.  The patient received multiple rounds of IV Dilaudid with persistent pain.  Additionally received IV Reglan 10 mg x 1, IV Zofran 4 mg x 1 IV Toradol 15 mg x 1 but continued to be symptomatic.  [facility], hospitalist service, was asked to admit.  The patient was accepted by Dr. Loney Loh, [facility], and transferred to [facility] telemetry unit as observation status.  ED Course: BP 152/82, pulse 95, respiratory 20, O2 saturation 96% on room air.  Lab studies markable for hemoglobin 11.6, WBC 7.6, platelet count 395.  Serum sodium 134, serum bicarb 21, serum glucose 121, lipase 1839.  Review of Systems: Review of systems as noted in the HPI.  All other systems reviewed and are negative.   Past Medical History:  Diagnosis Date   ADD (attention deficit disorder)    Alcohol withdrawal seizure    Alcohol-induced pancreatitis    Anxiety    Arthritis    Asthma    allergy induced per patient   Disc disorder    Hyperlipidemia    Hypertension    Pneumonia    Past Surgical History:  Procedure Laterality Date   CESAREAN SECTION     CHOLECYSTECTOMY  2022   FOOT SURGERY Left    FRACTURE SURGERY Right 07/2020   R wrist    HERNIA REPAIR     KNEE ARTHROSCOPY     KYPHOPLASTY N/A 10/13/2018   Procedure: THORACIC 12 KYPHOPLASTY;  Surgeon: Estill Bamberg, MD;  Location: [facility] OR;  Service: Orthopedics;  Laterality: N/A;   KYPHOPLASTY N/A 02/05/2021   Procedure: LUMBAR ONE  AND LUMBAR FOUR KYPHOPLASTY;  Surgeon: Estill Bamberg, MD;  Location: [facility] OR;  Service: Orthopedics;  Laterality: N/A;    Social History:  reports that she has never smoked. She has never used smokeless tobacco. She reports current alcohol use. She reports that she does not use drugs.   Allergies  Allergen Reactions   Latex Other (See Comments)    "moreso like bandages"- these cause blisters   Wound Dressings Other (See Comments)    "moreso like bandages"- these cause blisters   Valsartan-Hydrochlorothiazide Other (See Comments)    Caused pancreatitis     Family History  Problem Relation Age of Onset   Pancreatitis Brother    Seizures Neg Hx  Prior to Admission medications   Medication Sig Start Date End Date Taking? Authorizing Provider  amLODipine (NORVASC) 5 MG tablet Take 5 mg by mouth daily. 10/24/22  Yes [provider]  gabapentin (NEURONTIN) 300 MG capsule Take 300 mg by mouth at bedtime. 12/15/22  Yes [provider]  metoprolol succinate (TOPROL-XL) 25 MG 24 hr tablet Take 25 mg by mouth daily. 12/02/22  Yes [provider]  QUEtiapine (SEROQUEL) 100 MG tablet Take 100 mg by mouth at bedtime. 01/06/23  Yes [provider]  acetaminophen (TYLENOL) 500 MG tablet Take 1,000 mg by mouth daily as needed for mild pain or headache.    [provider]  ALPRAZolam Prudy Feeler) 1 MG tablet Take 0.5 mg by mouth 3 (three) times daily as needed for sleep or anxiety. 05/23/21   [provider]  aspirin EC 81 MG tablet Take 81 mg by mouth in the morning. Swallow whole.    [provider]  atorvastatin (LIPITOR) 40 MG tablet Take 40 mg by mouth daily.  06/12/16   [provider]  busPIRone (BUSPAR) 10 MG tablet Take 10 mg by mouth as needed (anxiety). 08/01/22 08/01/23  [provider]  Cholecalciferol 1.25 MG (50000 UT) capsule Take 50,000 Units by mouth 2 (two) times a week. 07/28/22   [provider]  desvenlafaxine (PRISTIQ) 100 MG 24 hr tablet Take 100 mg by mouth at bedtime.    [provider]  fenofibrate 160 MG tablet Take 160 mg by mouth daily.    [provider]  fexofenadine (ALLEGRA) 180 MG tablet Take 180 mg by mouth daily as needed for allergies or rhinitis.    [provider]  folic acid (FOLVITE) 1 MG tablet Take 1 tablet (1 mg total) by mouth daily. Patient not taking: Reported on 08/07/2022 05/20/21   [name], MD  meloxicam (MOBIC) 7.5 MG tablet Take 7.5 mg by mouth every morning. May take a second dose in evening if needed    [provider]  omeprazole (PRILOSEC) 40 MG capsule Take 40 mg by mouth daily.    [provider]  sucralfate (CARAFATE) 1 g tablet Take 1 tablet (1 g total) by mouth 3 (three) times daily with meals. Patient not taking: Reported on 08/07/2022 09/03/20   Geoffery Lyons, MD  Teriparatide, Recombinant, 600 MCG/2.4ML SOPN Inject into the skin.    [provider]  thiamine 100 MG tablet Take 1 tablet (100 mg total) by mouth daily. Patient not taking: Reported on 08/07/2022 05/20/21   [name], MD  tiZANidine (ZANAFLEX) 4 MG tablet Take 4 mg by mouth in the morning and at  bedtime. 04/26/20   [provider]  zolpidem (AMBIEN CR) 12.5 MG CR tablet Take 12.5 mg by mouth at bedtime. 07/16/16   [provider]    Physical Exam: BP (!) 152/82 (BP Location: Left Arm)   Pulse 95   Temp 98.1 F (36.7 C) (Oral)   Resp 20   Ht  (1.702 m)   Wt 95.3 kg   LMP 06/30/2011   SpO2 96%   BMI 32.89 kg/m   General: 62 y.o. year-old female well developed well nourished in no acute distress.  Alert and oriented x3. Cardiovascular: Regular rate and rhythm with no rubs or gallops.  No thyromegaly or JVD noted.  No lower extremity edema. 2/4 pulses in all 4 extremities. Respiratory: Clear to auscultation with no wheezes or rales. Good inspiratory effort. Abdomen: Soft epigastric tenderness  nondistended with normal bowel sounds x4 quadrants. Muskuloskeletal: No cyanosis, clubbing or edema noted bilaterally Neuro: CN II-XII intact, strength, sensation, reflexes Skin: Left ankle post surgery with possible wound dehiscence Psychiatry: Judgement and insight appear normal. Mood is appropriate for condition and setting          Labs on Admission:  Basic Metabolic Panel: Recent Labs  Lab 01/13/23 1415  NA 134*  K 3.7  CL 100  CO2 21*  GLUCOSE 121*  BUN 8  CREATININE 0.68  CALCIUM 9.1   Liver Function Tests: Recent Labs  Lab 01/13/23 1415  AST 15  ALT 15  ALKPHOS 90  BILITOT 0.4  PROT 6.5  ALBUMIN 4.2   Recent Labs  Lab 01/13/23 1415  LIPASE 1,839*   No results for input(s): "AMMONIA" in the last 168 hours. CBC: Recent Labs  Lab 01/13/23 1415  WBC 7.6  HGB 11.6*  HCT 34.6*  MCV 92.3  PLT 395   Cardiac Enzymes: No results for input(s): "CKTOTAL", "CKMB", "CKMBINDEX", "TROPONINI" in the last 168 hours.  BNP (last 3 results) No results for input(s): "BNP" in the last 8760 hours.  ProBNP (last 3 results) No results for input(s): "PROBNP" in the last 8760 hours.  CBG: No results for input(s): "GLUCAP" in the last 168  hours.  Radiological Exams on Admission: CT ABDOMEN PELVIS W CONTRAST  Result Date: 01/13/2023 CLINICAL DATA:  Acute pancreatitis. EXAM: CT ABDOMEN AND PELVIS WITH CONTRAST TECHNIQUE: Multidetector CT imaging of the abdomen and pelvis was performed using the standard protocol following bolus administration of intravenous contrast. RADIATION DOSE REDUCTION: This exam was performed according to the departmental dose-optimization program which includes automated exposure control, adjustment of the mA and/or kV according to patient size and/or use of iterative reconstruction technique. CONTRAST:  85mL OMNIPAQUE IOHEXOL 300 MG/ML  SOLN COMPARISON:  08/06/2022 FINDINGS: Lower chest: Bibasilar atelectasis but no pleural effusions or pulmonary infiltrates. Embolized bone cement is noted in the right lower lobe. The heart is within normal limits in size. No pericardial effusion. Moderate to large hiatal hernia. Hepatobiliary: No hepatic lesions or intrahepatic biliary dilatation. The gallbladder is surgically absent. No common bile duct dilatation. Pancreas: Changes of diffuse interstitial pancreatitis with fairly marked inflammation/edema and around the pancreas. No findings to suggest pancreatic necrosis. No hemorrhage/hematoma. The splenic vein is patent. Spleen: Normal size.  No focal lesions. Adrenals/Urinary Tract: Adrenal glands and kidneys are unremarkable. Bladder is unremarkable. Stomach/Bowel: The stomach is unremarkable. There is mild inflammation of the second and third portions of the duodenum. The small bowel and colon are unremarkable. Vascular/Lymphatic: Stable scattered atherosclerotic calcifications involving the aorta but no aneurysm or dissection. The branch vessels are patent. The major venous structures are patent. No mesenteric or retroperitoneal mass or adenopathy. Reproductive: Stable right-sided uterine fibroid. No adnexal masses. Other: No pelvic mass or adenopathy. No free pelvic fluid  collections. No inguinal mass or adenopathy. No abdominal wall hernia or subcutaneous lesions. Musculoskeletal: Vertebral augmentation changes noted at T12, L1 and L4. No acute bony findings. Remote compression fracture of T11 IMPRESSION: 1. Changes of diffuse interstitial pancreatitis with fairly marked inflammation/edema in and around the pancreas. No findings to suggest pancreatic necrosis. No hemorrhage/hematoma. 2. Mild inflammation of the second and third portions of the duodenum. 3. Status post cholecystectomy. No biliary dilatation. 4. Moderate to large hiatal hernia. Aortic Atherosclerosis (ICD10-I70.0). Electronically Signed   By: Rudie Meyer M.D.   On: 01/13/2023 16:36    EKG: I independently viewed the EKG  done and my findings are as followed: Normal sinus rhythm rate of 78.  Nonspecific ST-T changes.  QTc 474.  Assessment/Plan Present on Admission:  Acute pancreatitis  Principal Problem:   Acute pancreatitis  Acute pancreatitis, unclear etiology. Presented with epigastric pain radiating to her back Lipase elevated greater than 1800 Findings of acute pancreatitis on CT scan, without necrosis. Denies use of alcohol for more than 2 years IV fluid LR at 100 cc/h x 2 days IV analgesics as needed IV antiemetics as needed N.p.o. except sips with meds, ice chips, advance diet as tolerated.  History of alcohol abuse No reported recent alcohol intake States she quit more than 2 years ago  Hypovolemic hyponatremia Sodium 134 Continue IV hydration  Non-anion gap metabolic acidosis, mild Serum bicarb 21, anion gap of 13 IV fluid hydration Repeat BMP in the morning.  Anemia of chronic disease Hemoglobin 11.6K No reported overt bleeding Monitor H&H.  Recent left ankle surgery Surgery done at [facility], next appointment on 01/15/23 Possible lateral wound dehiscence Wound care specialist consulted Currently afebrile with no leukocytosis   DVT prophylaxis: Subcu Lovenox  daily  Code Status: Full code  Family Communication: None at bedside  Disposition Plan: Admitted to telemetry unit  Consults called: None.  Admission status: Observation status.   Status is: Observation    Darlin Drop MD [facility] Pager [phone]  If 7PM-7AM, please contact night-coverage [email or web address] Password [facility]  01/13/2023, 11:08 PM

## 2023-01-13 NOTE — ED Notes (Signed)
Patient transported to CT 

## 2023-01-14 DIAGNOSIS — I959 Hypotension, unspecified: Secondary | ICD-10-CM | POA: Diagnosis not present

## 2023-01-14 DIAGNOSIS — K861 Other chronic pancreatitis: Secondary | ICD-10-CM | POA: Diagnosis present

## 2023-01-14 DIAGNOSIS — Z888 Allergy status to other drugs, medicaments and biological substances status: Secondary | ICD-10-CM | POA: Diagnosis not present

## 2023-01-14 DIAGNOSIS — E785 Hyperlipidemia, unspecified: Secondary | ICD-10-CM | POA: Diagnosis present

## 2023-01-14 DIAGNOSIS — R1013 Epigastric pain: Secondary | ICD-10-CM | POA: Diagnosis present

## 2023-01-14 DIAGNOSIS — S82892D Other fracture of left lower leg, subsequent encounter for closed fracture with routine healing: Secondary | ICD-10-CM | POA: Diagnosis not present

## 2023-01-14 DIAGNOSIS — E861 Hypovolemia: Secondary | ICD-10-CM | POA: Diagnosis present

## 2023-01-14 DIAGNOSIS — K859 Acute pancreatitis without necrosis or infection, unspecified: Secondary | ICD-10-CM | POA: Diagnosis present

## 2023-01-14 DIAGNOSIS — M48061 Spinal stenosis, lumbar region without neurogenic claudication: Secondary | ICD-10-CM | POA: Diagnosis present

## 2023-01-14 DIAGNOSIS — K853 Drug induced acute pancreatitis without necrosis or infection: Secondary | ICD-10-CM | POA: Diagnosis not present

## 2023-01-14 DIAGNOSIS — D638 Anemia in other chronic diseases classified elsewhere: Secondary | ICD-10-CM | POA: Diagnosis present

## 2023-01-14 DIAGNOSIS — Z7982 Long term (current) use of aspirin: Secondary | ICD-10-CM | POA: Diagnosis not present

## 2023-01-14 DIAGNOSIS — E872 Acidosis, unspecified: Secondary | ICD-10-CM | POA: Diagnosis present

## 2023-01-14 DIAGNOSIS — E871 Hypo-osmolality and hyponatremia: Secondary | ICD-10-CM | POA: Diagnosis present

## 2023-01-14 DIAGNOSIS — E781 Pure hyperglyceridemia: Secondary | ICD-10-CM | POA: Diagnosis present

## 2023-01-14 DIAGNOSIS — Z79899 Other long term (current) drug therapy: Secondary | ICD-10-CM | POA: Diagnosis not present

## 2023-01-14 DIAGNOSIS — F909 Attention-deficit hyperactivity disorder, unspecified type: Secondary | ICD-10-CM | POA: Diagnosis present

## 2023-01-14 DIAGNOSIS — R001 Bradycardia, unspecified: Secondary | ICD-10-CM | POA: Diagnosis present

## 2023-01-14 DIAGNOSIS — J45909 Unspecified asthma, uncomplicated: Secondary | ICD-10-CM | POA: Diagnosis present

## 2023-01-14 DIAGNOSIS — K858 Other acute pancreatitis without necrosis or infection: Secondary | ICD-10-CM | POA: Diagnosis not present

## 2023-01-14 DIAGNOSIS — F1011 Alcohol abuse, in remission: Secondary | ICD-10-CM | POA: Diagnosis present

## 2023-01-14 DIAGNOSIS — I10 Essential (primary) hypertension: Secondary | ICD-10-CM | POA: Diagnosis present

## 2023-01-14 DIAGNOSIS — Z9049 Acquired absence of other specified parts of digestive tract: Secondary | ICD-10-CM | POA: Diagnosis not present

## 2023-01-14 DIAGNOSIS — Z91040 Latex allergy status: Secondary | ICD-10-CM | POA: Diagnosis not present

## 2023-01-14 LAB — CBC
HCT: 37.2 % (ref 36.0–46.0)
Hemoglobin: 11.6 g/dL — ABNORMAL LOW (ref 12.0–15.0)
MCH: 30.5 pg (ref 26.0–34.0)
MCHC: 31.2 g/dL (ref 30.0–36.0)
MCV: 97.9 fL (ref 80.0–100.0)
Platelets: 334 10*3/uL (ref 150–400)
RBC: 3.8 MIL/uL — ABNORMAL LOW (ref 3.87–5.11)
RDW: 15.9 % — ABNORMAL HIGH (ref 11.5–15.5)
WBC: 9 10*3/uL (ref 4.0–10.5)
nRBC: 0 % (ref 0.0–0.2)

## 2023-01-14 LAB — COMPREHENSIVE METABOLIC PANEL
ALT: 17 U/L (ref 0–44)
AST: 20 U/L (ref 15–41)
Albumin: 3.7 g/dL (ref 3.5–5.0)
Alkaline Phosphatase: 95 U/L (ref 38–126)
Anion gap: 9 (ref 5–15)
BUN: 11 mg/dL (ref 8–23)
CO2: 22 mmol/L (ref 22–32)
Calcium: 8.4 mg/dL — ABNORMAL LOW (ref 8.9–10.3)
Chloride: 104 mmol/L (ref 98–111)
Creatinine, Ser: 0.63 mg/dL (ref 0.44–1.00)
GFR, Estimated: >60 mL/min (ref >60)
Glucose, Bld: 100 mg/dL — ABNORMAL HIGH (ref 70–99)
Potassium: 3.6 mmol/L (ref 3.5–5.1)
Sodium: 135 mmol/L (ref 135–145)
Total Bilirubin: 0.6 mg/dL (ref 0.3–1.2)
Total Protein: 6.8 g/dL (ref 6.5–8.1)

## 2023-01-14 LAB — LIPASE, BLOOD: Lipase: 1111 U/L — ABNORMAL HIGH (ref 11–51)

## 2023-01-14 LAB — PHOSPHORUS: Phosphorus: 3.7 mg/dL (ref 2.5–4.6)

## 2023-01-14 LAB — TRIGLYCERIDES: Triglycerides: 456 mg/dL — ABNORMAL HIGH (ref <150)

## 2023-01-14 LAB — MAGNESIUM: Magnesium: 1.7 mg/dL (ref 1.7–2.4)

## 2023-01-14 MED ORDER — DOUBLE ANTIBIOTIC 500-10000 UNIT/GM EX OINT
TOPICAL_OINTMENT | CUTANEOUS | Status: AC
  Filled 2023-01-14: qty 28.4

## 2023-01-14 NOTE — Progress Notes (Signed)
PROGRESS NOTE    Rhonda Espinoza  YNW:[id number] DOB: 1960-12-17 DOA: 01/13/2023 PCP: Eartha Inch, MD   Brief Narrative:  Rhonda Espinoza is a 62 y.o. female with medical history significant for prior alcohol induced pancreatitis, recurrent pancreatitis, hypertension, hyperlipidemia, lumbar stenosis, history of cholecystectomy, C-section, hernia repair, recent left ankle surgery 11/28/22 at [facility] by Dr. Karie Soda, who initially presented to [facility] ED with complaints of epigastric pain for the past 5 days.  Patient's imaging and labs are consistent with acute pancreatitis, hospitalist called for admission.  Assessment & Plan:   Principal Problem:   Acute pancreatitis  Acute pancreatitis, secondary to hypertiglyceridemia, POA. Lipase elevated greater than 1800 -downtrending appropriately CT consistent with acute pancreatitis without necrosis Triglycerides remain elevated despite outpatient treatment with fibrates Continue IV fluid, advance diet to clears and follow clinically Continue supportive care, antiemetics and analgesics as appropriate Denies use of alcohol for more than 2 years   Hypovolemic hyponatremia, mild, resolving Continue IV fluids until p.o. intake is appropriate   Non-anion gap metabolic acidosis, mild Likely secondary to intractable nausea vomiting, resolved with IV fluids and supportive care   Anemia of chronic disease Chronic, appears to be at baseline   Recent left ankle surgery Surgery done at [facility], next appointment on 01/15/23 Wound care following, continue boot with ambulation  History of alcohol abuse Cessation 2 years ago, no alcohol since  DVT prophylaxis: Lovenox Code Status: Full Family Communication: None present  Status is: Inpatient  Dispo: The patient is from: Home              Anticipated d/c is to: Home              Anticipated d/c date is: 24 to 48 hours              Patient currently not medically stable for  discharge  Consultants:  None  Procedures:  None  Antimicrobials:  None indicated  Subjective: No acute issues or events overnight, abdominal pain markedly improving requesting advancement of diet which is certainly appropriate.  Objective: Vitals:   01/13/23 2155 01/13/23 2306 01/14/23 0300 01/14/23 0655  BP: 115/68 (!) 152/82 (!) 148/67   Pulse: 75 95 (!) 105 (!) 118  Resp: Temp:  98.1 F (36.7 C) 97.9 F (36.6 C)   TempSrc:  Oral Oral   SpO2: 97% 96% 91% (!) 89%  Weight:      Height:        Intake/Output Summary (Last 24 hours) at 01/14/2023 0804 Last data filed at 01/14/2023 0658 Gross per 24 hour  Intake 1664.97 ml  Output 0 ml  Net 1664.97 ml   Filed Weights   01/13/23 1402  Weight: 95.3 kg    Examination:  General:  Pleasantly resting in bed, No acute distress. HEENT:  Normocephalic atraumatic.  Sclerae nonicteric, noninjected.  Extraocular movements intact bilaterally. Neck:  Without mass or deformity.  Trachea is midline. Lungs:  Clear to auscultate bilaterally without rhonchi, wheeze, or rales. Heart:  Regular rate and rhythm.  Without murmurs, rubs, or gallops. Abdomen:  Soft, diffusely tender without guarding or rebound. Extremities: Without cyanosis, clubbing, edema -left ankle postop wound noted Skin:  Warm and dry, no erythema  Data Reviewed: I have personally reviewed following labs and imaging studies  CBC: Recent Labs  Lab 01/13/23 1415 01/14/23 0505  WBC 7.6 9.0  HGB 11.6* 11.6*  HCT 34.6* 37.2  MCV 92.3 97.9  PLT  395 334   Basic Metabolic Panel: Recent Labs  Lab 01/13/23 1415 01/14/23 0505  NA 134* 135  K 3.7 3.6  CL 100 104  CO2 21* 22  GLUCOSE 121* 100*  BUN 8 11  CREATININE 0.68 0.63  CALCIUM 9.1 8.4*  MG  --  1.7  PHOS  --  3.7   GFR: Estimated Creatinine Clearance: 87.6 mL/min (by C-G formula based on SCr of 0.63 mg/dL). Liver Function Tests: Recent Labs  Lab 01/13/23 1415 01/14/23 0505  AST 15  20  ALT 15 17  ALKPHOS 90 95  BILITOT 0.4 0.6  PROT 6.5 6.8  ALBUMIN 4.2 3.7   Recent Labs  Lab 01/13/23 1415 01/14/23 0505  LIPASE 1,839* 1,111*   Lipid Profile: Recent Labs    01/13/23 1415  TRIG 456*    No results found for this or any previous visit (from the past 240 hour(s)).   Radiology Studies: CT ABDOMEN PELVIS W CONTRAST  Result Date: 01/13/2023 CLINICAL DATA:  Acute pancreatitis. EXAM: CT ABDOMEN AND PELVIS WITH CONTRAST TECHNIQUE: Multidetector CT imaging of the abdomen and pelvis was performed using the standard protocol following bolus administration of intravenous contrast. RADIATION DOSE REDUCTION: This exam was performed according to the departmental dose-optimization program which includes automated exposure control, adjustment of the mA and/or kV according to patient size and/or use of iterative reconstruction technique. CONTRAST:  85mL OMNIPAQUE IOHEXOL 300 MG/ML  SOLN COMPARISON:  08/06/2022 FINDINGS: Lower chest: Bibasilar atelectasis but no pleural effusions or pulmonary infiltrates. Embolized bone cement is noted in the right lower lobe. The heart is within normal limits in size. No pericardial effusion. Moderate to large hiatal hernia. Hepatobiliary: No hepatic lesions or intrahepatic biliary dilatation. The gallbladder is surgically absent. No common bile duct dilatation. Pancreas: Changes of diffuse interstitial pancreatitis with fairly marked inflammation/edema and around the pancreas. No findings to suggest pancreatic necrosis. No hemorrhage/hematoma. The splenic vein is patent. Spleen: Normal size.  No focal lesions. Adrenals/Urinary Tract: Adrenal glands and kidneys are unremarkable. Bladder is unremarkable. Stomach/Bowel: The stomach is unremarkable. There is mild inflammation of the second and third portions of the duodenum. The small bowel and colon are unremarkable. Vascular/Lymphatic: Stable scattered atherosclerotic calcifications involving the aorta but  no aneurysm or dissection. The branch vessels are patent. The major venous structures are patent. No mesenteric or retroperitoneal mass or adenopathy. Reproductive: Stable right-sided uterine fibroid. No adnexal masses. Other: No pelvic mass or adenopathy. No free pelvic fluid collections. No inguinal mass or adenopathy. No abdominal wall hernia or subcutaneous lesions. Musculoskeletal: Vertebral augmentation changes noted at T12, L1 and L4. No acute bony findings. Remote compression fracture of T11 IMPRESSION: 1. Changes of diffuse interstitial pancreatitis with fairly marked inflammation/edema in and around the pancreas. No findings to suggest pancreatic necrosis. No hemorrhage/hematoma. 2. Mild inflammation of the second and third portions of the duodenum. 3. Status post cholecystectomy. No biliary dilatation. 4. Moderate to large hiatal hernia. Aortic Atherosclerosis (ICD10-I70.0). Electronically Signed   By: Rudie Meyer M.D.   On: 01/13/2023 16:36    Scheduled Meds:  aspirin EC  81 mg Oral Daily   enoxaparin (LOVENOX) injection  40 mg Subcutaneous Q24H   gabapentin  300 mg Oral QHS   pantoprazole  40 mg Oral Daily   QUEtiapine  100 mg Oral QHS   senna-docusate  1 tablet Oral QHS   simethicone  80 mg Oral QID   Continuous Infusions:  lactated ringers 100 mL/hr at 01/13/23 2320  LOS: 0 days   Time spent:  Azucena Fallen, DO [facility]  If 7PM-7AM, please contact night-coverage [email or web address]  01/14/2023, 8:04 AM

## 2023-01-14 NOTE — Consult Note (Addendum)
WOC Nurse Consult Note: Reason for Consult: Consult requested for left ankle.  Pt had surgery at [facility] on 3/1 for a fracture. Pt was recently seen by the ortho team as an outpatient to assess the locations.  There are 2 areas of healing full thickness wounds. No odor, drainage, or fluctuance. Left outer ankle red and dry scab; 2X2X.1cm Left inner ankle red and dry over previous incision; 2X1X.1cm Dressing procedure/placement/frequency: Topical treatment orders provided for bedside nurses to perform as follows: Apply antibiotic ointment to inner and outer left ankle wounds Q day, then re-cover with foam dressings.  Change foam dressings Q 3 days or PRN.  After applying ointment and foam dressings, apply kerlex and ace wrap, beginning just behind toes to below knee, then reapply walking boot. Pt plans to follow-up with ortho surgeon after discharge.  Please re-consult if further assistance is needed.  Thank-you,  Cammie Mcgee MSN, RN, CWOCN, [place], CNS [phone]

## 2023-01-15 DIAGNOSIS — K858 Other acute pancreatitis without necrosis or infection: Secondary | ICD-10-CM | POA: Diagnosis not present

## 2023-01-15 LAB — CBC
HCT: 28.9 % — ABNORMAL LOW (ref 36.0–46.0)
Hemoglobin: 9.2 g/dL — ABNORMAL LOW (ref 12.0–15.0)
MCH: 30.7 pg (ref 26.0–34.0)
MCHC: 31.8 g/dL (ref 30.0–36.0)
MCV: 96.3 fL (ref 80.0–100.0)
Platelets: 280 10*3/uL (ref 150–400)
RBC: 3 MIL/uL — ABNORMAL LOW (ref 3.87–5.11)
RDW: 16 % — ABNORMAL HIGH (ref 11.5–15.5)
WBC: 6.9 10*3/uL (ref 4.0–10.5)
nRBC: 0 % (ref 0.0–0.2)

## 2023-01-15 LAB — COMPREHENSIVE METABOLIC PANEL
ALT: 12 U/L (ref 0–44)
AST: 12 U/L — ABNORMAL LOW (ref 15–41)
Albumin: 2.7 g/dL — ABNORMAL LOW (ref 3.5–5.0)
Alkaline Phosphatase: 78 U/L (ref 38–126)
Anion gap: 7 (ref 5–15)
BUN: 7 mg/dL — ABNORMAL LOW (ref 8–23)
CO2: 25 mmol/L (ref 22–32)
Calcium: 7.8 mg/dL — ABNORMAL LOW (ref 8.9–10.3)
Chloride: 103 mmol/L (ref 98–111)
Creatinine, Ser: 0.59 mg/dL (ref 0.44–1.00)
GFR, Estimated: >60 mL/min (ref >60)
Glucose, Bld: 105 mg/dL — ABNORMAL HIGH (ref 70–99)
Potassium: 3.7 mmol/L (ref 3.5–5.1)
Sodium: 135 mmol/L (ref 135–145)
Total Bilirubin: 0.6 mg/dL (ref 0.3–1.2)
Total Protein: 5.5 g/dL — ABNORMAL LOW (ref 6.5–8.1)

## 2023-01-15 LAB — LIPASE, BLOOD: Lipase: 170 U/L — ABNORMAL HIGH (ref 11–51)

## 2023-01-15 NOTE — Discharge Summary (Signed)
Physician Discharge Summary  Rhonda Espinoza ZOX:[id number] DOB: Dec 15, 1960 DOA: 01/13/2023  PCP: Eartha Inch, MD  Admit date: 01/13/2023 Discharge date: 01/15/2023  Admitted From: Home Disposition: Home  Recommendations for Outpatient Follow-up:  Follow up with PCP in 1-2 weeks  Home Health: None Equipment/Devices: None  Discharge Condition: Stable CODE STATUS: Full Diet recommendation: Low-salt low-fat bland diet  Brief/Interim Summary: Rhonda Espinoza is a 62 y.o. female with medical history significant for prior alcohol induced pancreatitis, recurrent pancreatitis, hypertension, hyperlipidemia, lumbar stenosis, history of cholecystectomy, C-section, hernia repair, recent left ankle surgery 11/28/22 at [facility] by Dr. Karie Soda, who initially presented to [facility] ED with complaints of epigastric pain for the past 5 days.  Patient's imaging and labs are consistent with acute pancreatitis, hospitalist called for admission.   Patient admitted as above with acute complaints of epigastric pain with elevated lipase concerning for pancreatitis with unequivocal imaging.  At this time patient's lipase is within normal limits she is tolerating p.o. quite well with no further signs or symptoms of epigastric pain nausea vomiting or other symptoms.  Recommend close follow-up with PCP in the next 1 to 2 weeks for ongoing discussion of dietary and medication changes to reduce risk of recurrent episodes of pancreatitis.  Of note patient also had borderline hypotension and bradycardia as such her blood pressure medications and heart rate medications have been discontinued as below.  Certainly if her blood pressure continues to climb or heart rate increases it would be reasonable to resume these medications but in the interim given her control off these medications they will not be continued.   Discharge Diagnoses:  Principal Problem:   Acute pancreatitis  Acute pancreatitis, secondary to  hypertiglyceridemia, POA. Lipase elevated greater than 1800 -downtrending appropriately -currently within normal limits CT consistent with acute pancreatitis without necrosis Triglycerides remain elevated despite outpatient treatment with fibrates -discussed dietary changes Denies use of alcohol for more than 2 years   Hypovolemic hyponatremia, mild, resolving P.o. intake appropriate, stop IV fluids   Non-anion gap metabolic acidosis, mild, resolved Likely secondary to intractable nausea vomiting, resolved with IV fluids and supportive care   Anemia of chronic disease Chronic, appears to be at baseline   Recent left ankle surgery Surgery done at [facility], next appointment in 2 weeks per the patient Wound care following, continue boot with ambulation   History of alcohol abuse Cessation 2 years ago, no alcohol since   Discharge Instructions   Allergies as of 01/15/2023       Reactions   Latex Other (See Comments)   "moreso like bandages"- these cause blisters   Wound Dressings Other (See Comments)   "moreso like bandages"- these cause blisters   Valsartan-hydrochlorothiazide Other (See Comments)   Caused pancreatitis         Medication List     STOP taking these medications    amLODipine 5 MG tablet Commonly known as: NORVASC   gabapentin 300 MG capsule Commonly known as: NEURONTIN   ibuprofen 200 MG tablet Commonly known as: ADVIL   metoprolol succinate 25 MG 24 hr tablet Commonly known as: TOPROL-XL   oxyCODONE 5 MG immediate release tablet Commonly known as: Oxy IR/ROXICODONE   sucralfate 1 g tablet Commonly known as: Carafate   Teriparatide (Recombinant) 600 MCG/2.4ML Sopn   thiamine 100 MG tablet Commonly known as: VITAMIN B1   valsartan 80 MG tablet Commonly known as: DIOVAN       TAKE these medications    acetaminophen  500 MG tablet Commonly known as: TYLENOL Take 1,000 mg by mouth as needed for mild pain or headache.    ALPRAZolam 1 MG tablet Commonly known as: XANAX Take 1 mg by mouth daily as needed for sleep or anxiety.   aspirin EC 81 MG tablet Take 81 mg by mouth in the morning. Swallow whole.   atorvastatin 40 MG tablet Commonly known as: LIPITOR Take 40 mg by mouth daily.   busPIRone 10 MG tablet Commonly known as: BUSPAR Take 10 mg by mouth as needed (anxiety).   Cholecalciferol 1.25 MG (50000 UT) capsule Take 50,000 Units by mouth once a week.   desvenlafaxine 100 MG 24 hr tablet Commonly known as: PRISTIQ Take 100 mg by mouth at bedtime.   fenofibrate 160 MG tablet Take 160 mg by mouth daily.   fexofenadine 180 MG tablet Commonly known as: ALLEGRA Take 180 mg by mouth daily as needed for allergies or rhinitis.   folic acid 1 MG tablet Commonly known as: FOLVITE Take 1 tablet (1 mg total) by mouth daily.   multivitamin with minerals tablet Take 1 tablet by mouth daily.   omeprazole 40 MG capsule Commonly known as: PRILOSEC Take 40 mg by mouth daily.   ondansetron 4 MG tablet Commonly known as: ZOFRAN Take 4 mg by mouth as needed for nausea or vomiting.   QUEtiapine 100 MG tablet Commonly known as: SEROQUEL Take 100 mg by mouth at bedtime.   tiZANidine 4 MG tablet Commonly known as: ZANAFLEX Take 4 mg by mouth as needed for muscle spasms.   zolpidem 12.5 MG CR tablet Commonly known as: AMBIEN CR Take 12.5 mg by mouth at bedtime.        Allergies  Allergen Reactions   Latex Other (See Comments)    "moreso like bandages"- these cause blisters   Wound Dressings Other (See Comments)    "moreso like bandages"- these cause blisters   Valsartan-Hydrochlorothiazide Other (See Comments)    Caused pancreatitis     Consultations: None  Procedures/Studies: CT ABDOMEN PELVIS W CONTRAST  Result Date: 01/13/2023 CLINICAL DATA:  Acute pancreatitis. EXAM: CT ABDOMEN AND PELVIS WITH CONTRAST TECHNIQUE: Multidetector CT imaging of the abdomen and pelvis was  performed using the standard protocol following bolus administration of intravenous contrast. RADIATION DOSE REDUCTION: This exam was performed according to the departmental dose-optimization program which includes automated exposure control, adjustment of the mA and/or kV according to patient size and/or use of iterative reconstruction technique. CONTRAST:  85mL OMNIPAQUE IOHEXOL 300 MG/ML  SOLN COMPARISON:  08/06/2022 FINDINGS: Lower chest: Bibasilar atelectasis but no pleural effusions or pulmonary infiltrates. Embolized bone cement is noted in the right lower lobe. The heart is within normal limits in size. No pericardial effusion. Moderate to large hiatal hernia. Hepatobiliary: No hepatic lesions or intrahepatic biliary dilatation. The gallbladder is surgically absent. No common bile duct dilatation. Pancreas: Changes of diffuse interstitial pancreatitis with fairly marked inflammation/edema and around the pancreas. No findings to suggest pancreatic necrosis. No hemorrhage/hematoma. The splenic vein is patent. Spleen: Normal size.  No focal lesions. Adrenals/Urinary Tract: Adrenal glands and kidneys are unremarkable. Bladder is unremarkable. Stomach/Bowel: The stomach is unremarkable. There is mild inflammation of the second and third portions of the duodenum. The small bowel and colon are unremarkable. Vascular/Lymphatic: Stable scattered atherosclerotic calcifications involving the aorta but no aneurysm or dissection. The branch vessels are patent. The major venous structures are patent. No mesenteric or retroperitoneal mass or adenopathy. Reproductive: Stable right-sided uterine fibroid. No  adnexal masses. Other: No pelvic mass or adenopathy. No free pelvic fluid collections. No inguinal mass or adenopathy. No abdominal wall hernia or subcutaneous lesions. Musculoskeletal: Vertebral augmentation changes noted at T12, L1 and L4. No acute bony findings. Remote compression fracture of T11 IMPRESSION: 1. Changes  of diffuse interstitial pancreatitis with fairly marked inflammation/edema in and around the pancreas. No findings to suggest pancreatic necrosis. No hemorrhage/hematoma. 2. Mild inflammation of the second and third portions of the duodenum. 3. Status post cholecystectomy. No biliary dilatation. 4. Moderate to large hiatal hernia. Aortic Atherosclerosis (ICD10-I70.0). Electronically Signed   By: Rudie Meyer M.D.   On: 01/13/2023 16:36     Subjective: No acute issues or events overnight denies nausea vomiting diarrhea constipation headache fevers chills chest pain epigastric pain.  Tolerating p.o. quite well this morning.   Discharge Exam: Vitals:   01/14/23 2059 01/15/23 0211  BP: (!) 162/72 (!) 108/47  Pulse: 88 (!) 59  Resp: 20 20  Temp: 98.3 F (36.8 C) 97.8 F (36.6 C)  SpO2: 97% 94%   Vitals:   01/14/23 0655 01/14/23 1204 01/14/23 2059 01/15/23 0211  BP:  123/62 (!) 162/72 (!) 108/47  Pulse: (!) 118 98 88 (!) 59  Resp:  Temp:  98.7 F (37.1 C) 98.3 F (36.8 C) 97.8 F (36.6 C)  TempSrc:  Oral Oral Oral  SpO2: (!) 89% 91% 97% 94%  Weight:      Height:        General: Pt is alert, awake, not in acute distress Cardiovascular: RRR, S1/S2 +, no rubs, no gallops Respiratory: CTA bilaterally, no wheezing, no rhonchi Abdominal: Soft, NT, ND, bowel sounds + Extremities: no edema, no cyanosis  The results of significant diagnostics from this hospitalization (including imaging, microbiology, ancillary and laboratory) are listed below for reference.     Microbiology: No results found for this or any previous visit (from the past 240 hour(s)).   Labs: BNP (last 3 results) No results for input(s): "BNP" in the last 8760 hours. Basic Metabolic Panel: Recent Labs  Lab 01/13/23 1415 01/14/23 0505 01/15/23 0515  NA 134* 135 135  K 3.7 3.6 3.7  CL 100 104 103  CO2 21* 22 25  GLUCOSE 121* 100* 105*  BUN 8 11 7*  CREATININE 0.68 0.63 0.59  CALCIUM 9.1 8.4* 7.8*   MG  --  1.7  --   PHOS  --  3.7  --    Liver Function Tests: Recent Labs  Lab 01/13/23 1415 01/14/23 0505 01/15/23 0515  AST 15 20 12*  ALT ALKPHOS 90 95 78  BILITOT 0.4 0.6 0.6  PROT 6.5 6.8 5.5*  ALBUMIN 4.2 3.7 2.7*   Recent Labs  Lab 01/13/23 1415 01/14/23 0505 01/15/23 0515  LIPASE 1,839* 1,111* 170*   No results for input(s): "AMMONIA" in the last 168 hours. CBC: Recent Labs  Lab 01/13/23 1415 01/14/23 0505 01/15/23 0515  WBC 7.6 9.0 6.9  HGB 11.6* 11.6* 9.2*  HCT 34.6* 37.2 28.9*  MCV 92.3 97.9 96.3  PLT 395 334 280   Lipid Profile Recent Labs    01/13/23 1415  TRIG 456*   Urinalysis    Component Value Date/Time   COLORURINE YELLOW 01/13/2023 1640   APPEARANCEUR CLEAR 01/13/2023 1640   LABSPEC 1.036 (H) 01/13/2023 1640   PHURINE 6.0 01/13/2023 1640   GLUCOSEU NEGATIVE 01/13/2023 1640   HGBUR NEGATIVE 01/13/2023 1640   BILIRUBINUR NEGATIVE 01/13/2023 1640  KETONESUR NEGATIVE 01/13/2023 1640   PROTEINUR NEGATIVE 01/13/2023 1640   NITRITE NEGATIVE 01/13/2023 1640   LEUKOCYTESUR NEGATIVE 01/13/2023 1640   Sepsis Labs Recent Labs  Lab 01/13/23 1415 01/14/23 0505 01/15/23 0515  WBC 7.6 9.0 6.9   Microbiology No results found for this or any previous visit (from the past 240 hour(s)).  Time coordinating discharge: Over 30 minutes  SIGNED:  Azucena Fallen, DO [facility] 01/15/2023, 11:05 AM Pager   If 7PM-7AM, please contact night-coverage [email or web address]

## 2023-04-29 ENCOUNTER — Ambulatory Visit (HOSPITAL_BASED_OUTPATIENT_CLINIC_OR_DEPARTMENT_OTHER): Payer: BC Managed Care – PPO | Attending: Orthopedic Surgery | Admitting: Physical Therapy

## 2023-04-29 ENCOUNTER — Encounter (HOSPITAL_BASED_OUTPATIENT_CLINIC_OR_DEPARTMENT_OTHER): Payer: Self-pay | Admitting: Physical Therapy

## 2023-04-29 ENCOUNTER — Other Ambulatory Visit: Payer: Self-pay

## 2023-04-29 DIAGNOSIS — M25571 Pain in right ankle and joints of right foot: Secondary | ICD-10-CM | POA: Insufficient documentation

## 2023-04-29 DIAGNOSIS — M25672 Stiffness of left ankle, not elsewhere classified: Secondary | ICD-10-CM | POA: Diagnosis present

## 2023-04-29 DIAGNOSIS — M25671 Stiffness of right ankle, not elsewhere classified: Secondary | ICD-10-CM | POA: Insufficient documentation

## 2023-04-29 DIAGNOSIS — M25572 Pain in left ankle and joints of left foot: Secondary | ICD-10-CM | POA: Diagnosis present

## 2023-04-29 DIAGNOSIS — R2689 Other abnormalities of gait and mobility: Secondary | ICD-10-CM | POA: Diagnosis present

## 2023-04-29 NOTE — Therapy (Signed)
OUTPATIENT PHYSICAL THERAPY LOWER EXTREMITY EVALUATION   Patient Name: Rhonda Espinoza MRN: [id number] DOB:1961-03-08, 62 y.o., female Today's Date: 04/30/2023  END OF SESSION:  PT End of Session - 04/29/23 0959     Visit Number 1    Number of Visits 16    Date for PT Re-Evaluation 06/24/23    PT Start Time 0930    PT Stop Time 1014    PT Time Calculation (min) 44 min    Activity Tolerance Patient tolerated treatment well    Behavior During Therapy WFL for tasks assessed/performed             Past Medical History:  Diagnosis Date   ADD (attention deficit disorder)    Alcohol withdrawal seizure (HCC)    Alcohol-induced pancreatitis    Anxiety    Arthritis    Asthma    allergy induced per patient   Disc disorder    Hyperlipidemia    Hypertension    Pneumonia    Past Surgical History:  Procedure Laterality Date   CESAREAN SECTION     CHOLECYSTECTOMY  2022   FOOT SURGERY Left    FRACTURE SURGERY Right 07/2020   R wrist    HERNIA REPAIR     KNEE ARTHROSCOPY     KYPHOPLASTY N/A 10/13/2018   Procedure: THORACIC 12 KYPHOPLASTY;  Surgeon: Estill Bamberg, MD;  Location: [facility] OR;  Service: Orthopedics;  Laterality: N/A;   KYPHOPLASTY N/A 02/05/2021   Procedure: LUMBAR ONE  AND LUMBAR FOUR KYPHOPLASTY;  Surgeon: Estill Bamberg, MD;  Location: [facility] OR;  Service: Orthopedics;  Laterality: N/A;   Patient Active Problem List   Diagnosis Date Noted   Fatty liver, alcoholic 08/07/2022   Acute pancreatitis 08/06/2022   Acute alcoholic pancreatitis 05/29/2021   Seizures (HCC) 05/18/2021   Hypokalemia 05/18/2021   Acute encephalopathy 05/18/2021   Alcohol withdrawal seizure (HCC) 05/18/2021   ETOH abuse 05/18/2021   Surgery, elective 02/05/2021   History of T12 kyphoplasty 01/24/2019   Hip pain, acute, right 01/16/2019   Acute leg pain, right 01/16/2019   Sciatica of right side without back pain 01/16/2019   Latex precautions, history of latex allergy 11/02/2018   Lesion of  right native kidney 08/30/2018   Abnormal MRI, lumbar spine (08/14/2018) 08/23/2018   Lumbar lateral recess stenosis (Left: L2-3) (Bilateral: L3-4) (Right: L4-5) 08/23/2018   Lumbar Grade 1 Retrolisthesis of L2/L3, L3/L4, & L4/L5 08/23/2018   Grade 1 Lumbar Anterolisthesis of L5/S1 08/23/2018   Lumbar facet hypertrophy (Multilevel) (Bilateral) 08/19/2018   Lumbar foraminal stenosis (Left: L2-3, L3-4) (Bilateral: L4-5, L5-S1) 08/19/2018   Lumbar central spinal stenosis 08/19/2018   DDD (degenerative disc disease), lumbar 08/05/2018   Traumatic compression fracture of T12 (<15%) thoracic vertebra, sequela 08/05/2018   Abnormal x-ray of lumbar spine (08/02/2018) 08/05/2018   Disorder of skeletal system 08/05/2018   Pharmacologic therapy 08/05/2018   Problems influencing health status 08/05/2018   Chronic pain syndrome 08/05/2018   Class 1 obesity with body mass index (BMI) of 33.0 to 33.9 in adult 05/24/2018   Seasonal allergic rhinitis due to pollen 02/03/2017   Chronic low back pain (Bilateral) (R>L) 08/04/2016   Long term current use of opiate analgesic 08/04/2016   Long term prescription opiate use 08/04/2016   Opiate use 08/04/2016   Spondylosis without myelopathy or radiculopathy, lumbar region 08/04/2016   ADD (attention deficit disorder) 05/22/2016   GAD (generalized anxiety disorder) 05/22/2016   Hypertension, essential 05/22/2016   Panic attacks 05/22/2016  Essential hypertension 08/30/2015   Back muscle spasm 01/31/2014   Abnormal LFTs 01/31/2014   Blood glucose elevated 01/31/2014   Insomnia 01/31/2014   Lumbar facet syndrome (Bilateral) (R>L) 01/31/2014   Elevated LFTs 01/31/2014   Avitaminosis D 07/03/2011   Vitamin D deficiency 07/03/2011   HLD (hyperlipidemia) 06/23/2011   Hypertriglyceridemia 06/23/2011    PCP: Dr. Antony Haste  REFERRING PROVIDER: Dr. Jamelle Haring Daws  REFERR Diagnosis  R52 (ICD-10-CM) - Pain, unspecified  T81.89XA (ICD-10-CM) - Other  complications of procedures, not elsewhere classified, initial encounter  S82.892A (ICD-10-CM) - Other fracture of left lower leg, initial encounter for closed fracture  ING DIAG:   THERAPY DIAG:  Pain in right ankle and joints of right foot  Pain in left ankle and joints of left foot  Stiffness of right ankle, not elsewhere classified  Stiffness of left ankle, not elsewhere classified  Other abnormalities of gait and mobility  Rationale for Evaluation and Treatment: Rehabilitation  ONSET DATE: Intital surgerry January of 2024 on right ankle. Patient can not recall date and not in chart. 2nd surgery on left ankle 11/28/2022 ORIF  SUBJECTIVE:   SUBJECTIVE STATEMENT: Th patient had a left ankle ORIF on 11/28/2022. The right ankel was fractured and she had to have a complete reconstruction some time in January of 2024. The patient can not remember exactly and it is not in the chart. She has been out of the boots for about a month. She also has a history of lumbar compression fx's. She has osteoporosis. She is walking without a device.  She also had a fall in which she suffered a fractured wrist  PERTINENT HISTORY: Osteoporosis; lumbar compression fx; broken wrist  PAIN:  Are you having pain? Yes: NPRS scale: 3/10 Pain location: left pull in ahcillies Pain description: aching  Aggravating factors: *weight bearing Relieving factors:   Are you having pain? Yes: NPRS scale: 3/10 Pain location: left pull in ahcillies Pain description: aching  Aggravating factors: standing and walking  Relieving factors: rest   PRECAUTIONS: Fall  RED FLAGS: None   WEIGHT BEARING RESTRICTIONS: No  FALLS:  Has patient fallen in last 6 months? Yes. Number of falls 3 2 falls resulted in broen andkles and 1 in a broken wrist   LIVING ENVIRONMENT: Small steps into the house   OCCUPATION:  Retired   Hobbies:  Hotel manager , walking   PLOF: Independent  PATIENT GOALS:   Get back to regular  activity.   NEXT MD VISIT:  1 month   OBJECTIVE:   DIAGNOSTIC FINDINGS:  Per patient: x-rays showing healing   PATIENT SURVEYS:  FOTO    COGNITION: Overall cognitive status: Within functional limits for tasks assessed     SENSATION: Numbness in the left 3 middle toes  EDEMA:  Per visual inspection mild bilateral edema in ankles MUSCLE LENGTH:  POSTURE:   Rounded shoulders, forward head, significant left foot external rotation in sitting and standing.  PALPATION: No unexpected tenderness to palpation  LOWER EXTREMITY ROM:  Passive ROM Right eval Left eval  Hip flexion    Hip extension    Hip abduction    Hip adduction    Hip internal rotation    Hip external rotation    Knee flexion    Knee extension    Ankle dorsiflexion 2 degrees Neutral  Ankle plantarflexion Mild limitation at end range Mild limitation at endrange  Ankle inversion    Ankle eversion     (Blank rows = not tested)  LOWER EXTREMITY MMT:  MMT Right eval Left eval  Hip flexion 22.1 20.2  Hip extension    Hip abduction 31.1 25.4  Hip adduction    Hip internal rotation    Hip external rotation    Knee flexion    Knee extension 26.8 24.4  Ankle dorsiflexion 4 4  Ankle plantarflexion Not test to Not tested  Ankle inversion Not tested Not tested  Ankle eversion Not tested Not tested   (Blank rows = not tested)   FUNCTIONAL TESTS:  5 times sit to stand test:  GAIT: Decreased bilateral lower extremity weightbearing.  Flexed posture.  Decreased bilateral hip flexion  TODAY'S TREATMENT:                                                                                                                              DATE:   Access Code: [email or web address] URL: [email or web address] Date: 04/30/2023 Prepared by: Lorayne Bender  Exercises - Seated Calf Towel Stretch  - 2 x daily - 7 x weekly - 3 sets - 3 reps - 20sec  hold - Seated Heel Raise  - 2 x daily - 7 x weekly - 3 sets - 10  reps - Ankle Inversion Eversion Towel Slide  - 2 x daily - 7 x weekly - 3 sets - 10 reps  PATIENT EDUCATION:  Education details: HEP, symptom management, POC. Person educated: Patient Education method: Explanation, Demonstration, Tactile cues, Verbal cues, and Handouts Education comprehension: verbalized understanding, returned demonstration, verbal cues required, tactile cues required, and needs further education  HOME EXERCISE PROGRAM:  [email or web address]  ASSESSMENT:  CLINICAL IMPRESSION: Is a 62 year old female with significant history of right ankle reconstruction and left ankle ORIF.  She has been weightbearing for about 2 months and has been out of the boot for about a month.  She is currently walking without a device.  She has a history of osteoporosis including multiple multilevel compression fractures in 2020 and 2022.  At this time she does complain of low back pain.  She has expected limitations in ankle motion, strength, and functional endurance.  She would benefit from skilled therapy to improve her strength and return to prior active lifestyle. OBJECTIVE IMPAIRMENTS: Abnormal gait, decreased activity tolerance, decreased knowledge of use of DME, difficulty walking, decreased ROM, decreased strength, and pain.   ACTIVITY LIMITATIONS: carrying, lifting, bending, sitting, standing, squatting, stairs, transfers, and locomotion level  PARTICIPATION LIMITATIONS: meal prep, cleaning, laundry, driving, shopping, community activity, , and yard work  PERSONAL FACTORS: 3+ comorbidities: Osteoporosis, multilevel lumbar fracture 2020, 2022; frequent falls  are also affecting patient's functional outcome.   REHAB POTENTIAL: Good  CLINICAL DECISION MAKING: Evolving/moderate complexity declining general functional mobility  EVALUATION COMPLEXITY: Moderate   GOALS: Goals reviewed with patient? Yes  SHORT TERM GOALS: Target date: 05/28/2023   Patient will increase gross bilateral lower  extremity strength by 5 pounds. Baseline: Goal status: INITIAL  2.  Patient will increase bilateral dorsiflexion  motion by 5 degrees Baseline:  Goal status: INITIAL  3.  Patient will stand for greater than 15 minutes without increased pain Baseline:  Goal status: INITIAL  4.  Patient will be independent with basic exercise program Baseline:  Goal status: INITIAL  LONG TERM GOALS: Target date: 06/24/2023    Patient will go up and down 8 steps with reciprocal gait pattern safely without increased pain Baseline:  Goal status: INITIAL  2.  Patient will ambulate community distances without increased pain Baseline:  Goal status: INITIAL  3.  Patient will have a complete land and pool program to promote strength and functional mobility Baseline:  Goal status: INITIAL   PLAN:  PT FREQUENCY: 2x/week  PT DURATION: 8 weeks  PLANNED INTERVENTIONS: Therapeutic exercises, Therapeutic activity, Neuromuscular re-education, Balance training, Gait training, Patient/Family education, Self Care, Joint mobilization, Stair training, DME instructions, Aquatic Therapy, Dry Needling, Electrical stimulation, Cryotherapy, Moist heat, Taping, Manual therapy, and Re-evaluation.   PLAN FOR NEXT SESSION:   Aquatics: Begin gait training.  Work on bilateral core and hip strengthening.  Work on ankle range of motion.  Work on weightbearing activity.  Consider progression of stair training when ready.  Land: Manual therapy to improve bilateral dorsiflexion.  Focused ankle stability strengthening.  Progressed to stair training and squats for functional mobility when able.  Consider instability when able to tolerate.   Dessie Coma, PT 04/30/2023, 9:20 AM

## 2023-05-21 ENCOUNTER — Ambulatory Visit (HOSPITAL_BASED_OUTPATIENT_CLINIC_OR_DEPARTMENT_OTHER): Payer: BC Managed Care – PPO | Admitting: Physical Therapy

## 2023-05-26 ENCOUNTER — Ambulatory Visit (HOSPITAL_BASED_OUTPATIENT_CLINIC_OR_DEPARTMENT_OTHER): Payer: BC Managed Care – PPO | Admitting: Physical Therapy

## 2023-05-26 ENCOUNTER — Encounter (HOSPITAL_BASED_OUTPATIENT_CLINIC_OR_DEPARTMENT_OTHER): Payer: Self-pay

## 2023-05-29 ENCOUNTER — Ambulatory Visit (HOSPITAL_BASED_OUTPATIENT_CLINIC_OR_DEPARTMENT_OTHER): Payer: BC Managed Care – PPO | Admitting: Physical Therapy

## 2023-06-08 ENCOUNTER — Ambulatory Visit (HOSPITAL_BASED_OUTPATIENT_CLINIC_OR_DEPARTMENT_OTHER): Payer: BC Managed Care – PPO | Admitting: Physical Therapy

## 2023-06-10 ENCOUNTER — Other Ambulatory Visit: Payer: Self-pay | Admitting: Obstetrics and Gynecology

## 2023-06-11 ENCOUNTER — Ambulatory Visit (HOSPITAL_BASED_OUTPATIENT_CLINIC_OR_DEPARTMENT_OTHER): Payer: BC Managed Care – PPO | Admitting: Physical Therapy

## 2023-06-12 LAB — SURGICAL PATHOLOGY

## 2023-06-15 ENCOUNTER — Ambulatory Visit (HOSPITAL_BASED_OUTPATIENT_CLINIC_OR_DEPARTMENT_OTHER): Payer: BC Managed Care – PPO | Admitting: Physical Therapy

## 2023-06-15 ENCOUNTER — Encounter (HOSPITAL_BASED_OUTPATIENT_CLINIC_OR_DEPARTMENT_OTHER): Payer: Self-pay

## 2023-06-17 ENCOUNTER — Ambulatory Visit (HOSPITAL_BASED_OUTPATIENT_CLINIC_OR_DEPARTMENT_OTHER): Payer: BC Managed Care – PPO | Admitting: Physical Therapy

## 2023-06-18 ENCOUNTER — Ambulatory Visit (HOSPITAL_BASED_OUTPATIENT_CLINIC_OR_DEPARTMENT_OTHER): Payer: BC Managed Care – PPO | Attending: Orthopedic Surgery | Admitting: Physical Therapy

## 2023-06-18 ENCOUNTER — Encounter (HOSPITAL_BASED_OUTPATIENT_CLINIC_OR_DEPARTMENT_OTHER): Payer: Self-pay | Admitting: Physical Therapy

## 2023-06-18 DIAGNOSIS — M25572 Pain in left ankle and joints of left foot: Secondary | ICD-10-CM | POA: Insufficient documentation

## 2023-06-18 DIAGNOSIS — M25672 Stiffness of left ankle, not elsewhere classified: Secondary | ICD-10-CM | POA: Insufficient documentation

## 2023-06-18 DIAGNOSIS — M25571 Pain in right ankle and joints of right foot: Secondary | ICD-10-CM | POA: Diagnosis present

## 2023-06-18 DIAGNOSIS — R2689 Other abnormalities of gait and mobility: Secondary | ICD-10-CM | POA: Insufficient documentation

## 2023-06-18 DIAGNOSIS — M25671 Stiffness of right ankle, not elsewhere classified: Secondary | ICD-10-CM | POA: Diagnosis present

## 2023-06-18 NOTE — Therapy (Signed)
OUTPATIENT PHYSICAL THERAPY LOWER EXTREMITY Recert   Patient Name: Rhonda Espinoza MRN: [id number] DOB:[birth year], 62 y.o., female Today's Date: 06/18/2023  END OF SESSION:  PT End of Session - 06/18/23 1759     Visit Number 2    Number of Visits 21    Date for PT Re-Evaluation 08/28/23    PT Start Time 0905    PT Stop Time 0940    PT Time Calculation (min) 35 min    Activity Tolerance Patient tolerated treatment well    Behavior During Therapy [facility] for tasks assessed/performed              Past Medical History:  Diagnosis Date   ADD (attention deficit disorder)    Alcohol withdrawal seizure (HCC)    Alcohol-induced pancreatitis    Anxiety    Arthritis    Asthma    allergy induced per patient   Disc disorder    Hyperlipidemia    Hypertension    Pneumonia    Past Surgical History:  Procedure Laterality Date   CESAREAN SECTION     CHOLECYSTECTOMY  2022   FOOT SURGERY Left    FRACTURE SURGERY Right 07/2020   R wrist    HERNIA REPAIR     KNEE ARTHROSCOPY     KYPHOPLASTY N/A 10/13/2018   Procedure: THORACIC 12 KYPHOPLASTY;  Surgeon: Estill Bamberg, MD;  Location: [facility] OR;  Service: Orthopedics;  Laterality: N/A;   KYPHOPLASTY N/A 02/05/2021   Procedure: LUMBAR ONE  AND LUMBAR FOUR KYPHOPLASTY;  Surgeon: Estill Bamberg, MD;  Location: [facility] OR;  Service: Orthopedics;  Laterality: N/A;   Patient Active Problem List   Diagnosis Date Noted   Fatty liver, alcoholic 08/07/2022   Acute pancreatitis 08/06/2022   Acute alcoholic pancreatitis 05/29/2021   Seizures (HCC) 05/18/2021   Hypokalemia 05/18/2021   Acute encephalopathy 05/18/2021   Alcohol withdrawal seizure (HCC) 05/18/2021   ETOH abuse 05/18/2021   Surgery, elective 02/05/2021   History of T12 kyphoplasty 01/24/2019   Hip pain, acute, right 01/16/2019   Acute leg pain, right 01/16/2019   Sciatica of right side without back pain 01/16/2019   Latex precautions, history of latex allergy 11/02/2018   Lesion of  right native kidney 08/30/2018   Abnormal MRI, lumbar spine (08/14/2018) 08/23/2018   Lumbar lateral recess stenosis (Left: L2-3) (Bilateral: L3-4) (Right: L4-5) 08/23/2018   Lumbar Grade 1 Retrolisthesis of L2/L3, L3/L4, & L4/L5 08/23/2018   Grade 1 Lumbar Anterolisthesis of L5/S1 08/23/2018   Lumbar facet hypertrophy (Multilevel) (Bilateral) 08/19/2018   Lumbar foraminal stenosis (Left: L2-3, L3-4) (Bilateral: L4-5, L5-S1) 08/19/2018   Lumbar central spinal stenosis 08/19/2018   DDD (degenerative disc disease), lumbar 08/05/2018   Traumatic compression fracture of T12 (<15%) thoracic vertebra, sequela 08/05/2018   Abnormal x-ray of lumbar spine (08/02/2018) 08/05/2018   Disorder of skeletal system 08/05/2018   Pharmacologic therapy 08/05/2018   Problems influencing health status 08/05/2018   Chronic pain syndrome 08/05/2018   Class 1 obesity with body mass index (BMI) of 33.0 to 33.9 in adult 05/24/2018   Seasonal allergic rhinitis due to pollen 02/03/2017   Chronic low back pain (Bilateral) (R>L) 08/04/2016   Long term current use of opiate analgesic 08/04/2016   Long term prescription opiate use 08/04/2016   Opiate use 08/04/2016   Spondylosis without myelopathy or radiculopathy, lumbar region 08/04/2016   ADD (attention deficit disorder) 05/22/2016   GAD (generalized anxiety disorder) 05/22/2016   Hypertension, essential 05/22/2016   Panic attacks 05/22/2016  Essential hypertension 08/30/2015   Back muscle spasm 01/31/2014   Abnormal LFTs 01/31/2014   Blood glucose elevated 01/31/2014   Insomnia 01/31/2014   Lumbar facet syndrome (Bilateral) (R>L) 01/31/2014   Elevated LFTs 01/31/2014   Avitaminosis D 07/03/2011   Vitamin D deficiency 07/03/2011   HLD (hyperlipidemia) 06/23/2011   Hypertriglyceridemia 06/23/2011    PCP: Dr. Antony Haste  REFERRING PROVIDER: Dr. Jamelle Haring Daws  REFERR Diagnosis  R52 (ICD-10-CM) - Pain, unspecified  T81.89XA (ICD-10-CM) - Other  complications of procedures, not elsewhere classified, initial encounter  S82.892A (ICD-10-CM) - Other fracture of left lower leg, initial encounter for closed fracture  ING DIAG:   THERAPY DIAG:  Pain in left ankle and joints of left foot  Stiffness of right ankle, not elsewhere classified  Stiffness of left ankle, not elsewhere classified  Pain in right ankle and joints of right foot  Other abnormalities of gait and mobility  Rationale for Evaluation and Treatment: Rehabilitation  ONSET DATE: Intital surgerry January of 2024 on right ankle. Patient can not recall date and not in chart. 2nd surgery on left ankle 11/28/2022 ORIF  SUBJECTIVE:   SUBJECTIVE STATEMENT: "I haven't been able to come back for therapy after evaluation because I fell and broke a few ribs.  I' not sure if I can step into pool safely"   Initial Subjective Th patient had a left ankle ORIF on 11/28/2022. The right ankel was fractured and she had to have a complete reconstruction some time in January of 2024. The patient can not remember exactly and it is not in the chart. She has been out of the boots for about a month. She also has a history of lumbar compression fx's. She has osteoporosis. She is walking without a device.  She also had a fall in which she suffered a fractured wrist  PERTINENT HISTORY: Osteoporosis; lumbar compression fx; broken wrist  PAIN:  Are you having pain? Yes: NPRS scale: 0-1/10 Pain location: left pull in ahcillies Pain description: aching  Aggravating factors: *weight bearing;standing and walking Relieving factors: rest    PRECAUTIONS: Fall  RED FLAGS: None   WEIGHT BEARING RESTRICTIONS: No  FALLS:  Has patient fallen in last 6 months? Yes. Number of falls 3 2 falls resulted in broen andkles and 1 in a broken wrist   LIVING ENVIRONMENT: Small steps into the house   OCCUPATION:  Retired   Hobbies:  Hotel manager , walking   PLOF: Independent  PATIENT GOALS:   Get  back to regular activity. Get back to swimming  NEXT MD VISIT:  1 month   OBJECTIVE:   DIAGNOSTIC FINDINGS:  Per patient: x-rays showing healing   PATIENT SURVEYS:  FOTO   unable to find in system 06/18/23: LEFS: 10/80  COGNITION: Overall cognitive status: Within functional limits for tasks assessed     SENSATION: Numbness in the left 3 middle toes  EDEMA:  Per visual inspection mild bilateral edema in ankles MUSCLE LENGTH:  POSTURE:   Rounded shoulders, forward head, significant left foot external rotation in sitting and standing.  PALPATION: No unexpected tenderness to palpation  LOWER EXTREMITY ROM:  Passive ROM Right eval Left eval R / L  Hip flexion     Hip extension     Hip abduction     Hip adduction     Hip internal rotation     Hip external rotation     Knee flexion     Knee extension     Ankle dorsiflexion 2  degrees Neutral 2d / 5d  Ankle plantarflexion Mild limitation at end range Mild limitation at end range  35d  Ankle inversion     Ankle eversion      (Blank rows = not tested)  LOWER EXTREMITY MMT:  MMT Right eval Left eval R / L 06/18/23  Hip flexion 22.1 20.2 34.6 / 40.3  Hip extension     Hip abduction 31.1 25.4 22.0 / 23.1  Hip adduction     Hip internal rotation     Hip external rotation     Knee flexion     Knee extension 26.8 24.4 48.4 / 39.9  Ankle dorsiflexion 4 4   Ankle plantarflexion Not test to Not tested   Ankle inversion Not tested Not tested   Ankle eversion Not tested Not tested    (Blank rows = not tested)   FUNCTIONAL TESTS:  5 times sit to stand test: Not tolerated 06/18/23  GAIT: Decreased bilateral lower extremity weightbearing.  Flexed posture.  Decreased bilateral hip flexion  06/18/23 off loading R&L wide range lateral sway  TODAY'S TREATMENT:                                                                                                                              Re-assess Pt declined getting into  pool.  Was not prepared   PATIENT EDUCATION:  Education details: HEP, symptom management, POC. Person educated: Patient Education method: Explanation, Demonstration, Tactile cues, Verbal cues, and Handouts Education comprehension: verbalized understanding, returned demonstration, verbal cues required, tactile cues required, and needs further education  HOME EXERCISE PROGRAM:  [email or web address] URL: [email or web address] Date: 04/30/2023 Prepared by: Lorayne Bender  Exercises - Seated Calf Towel Stretch  - 2 x daily - 7 x weekly - 3 sets - 3 reps - 20sec  hold - Seated Heel Raise  - 2 x daily - 7 x weekly - 3 sets - 10 reps - Ankle Inversion Eversion Towel Slide  - 2 x daily - 7 x weekly - 3 sets - 10 reps  ASSESSMENT:  CLINICAL IMPRESSION: Pt presents today 7 weeks post initial evaluation due to a fall with broken ribs.  She is reassessed and certification period extended.  She is demonstrating an improvement in hip flex and knee extension  strength but slight decrease in hip abd.  She reports no ankle pain.  Left ankle ROM improved.  She ambs to session today without an assistive device.  She continues to have functional deficits due to poor balance and gait dysfunction. She is a high fall risk.  She did amb to and from setting today with difficulty.  Encouragement to bring an AD is given. She will benefit form skilled PT intervention with initial sessions being aquatic based for added safety in environment allowing improvement in strength and gait/balance before transitioning to land. She will require use of lift but will progress to using stairs.    Initial Impression Is a 62 year old  female with significant history of right ankle reconstruction and left ankle ORIF.  She has been weightbearing for about 2 months and has been out of the boot for about a month.  She is currently walking without a device.  She has a history of osteoporosis including multiple multilevel compression  fractures in 2020 and 2022.  At this time she does complain of low back pain.  She has expected limitations in ankle motion, strength, and functional endurance.  She would benefit from skilled therapy to improve her strength and return to prior active lifestyle.  OBJECTIVE IMPAIRMENTS: Abnormal gait, decreased activity tolerance, decreased knowledge of use of DME, difficulty walking, decreased ROM, decreased strength, and pain.   ACTIVITY LIMITATIONS: carrying, lifting, bending, sitting, standing, squatting, stairs, transfers, and locomotion level  PARTICIPATION LIMITATIONS: meal prep, cleaning, laundry, driving, shopping, community activity, , and yard work  PERSONAL FACTORS: 3+ comorbidities: Osteoporosis, multilevel lumbar fracture 2020, 2022; frequent falls  are also affecting patient's functional outcome.   REHAB POTENTIAL: Good  CLINICAL DECISION MAKING: Evolving/moderate complexity declining general functional mobility  EVALUATION COMPLEXITY: Moderate   GOALS: Goals reviewed with patient? Yes  SHORT TERM GOALS: Target date: 07/17/23   Patient will increase gross bilateral lower extremity strength by 5 pounds. Baseline: Goal status: Met 06/18/23  2.  Patient will increase bilateral dorsiflexion motion by 5 degrees Baseline:  Goal status: In progress 06/18/23  3.  Patient will stand for greater than 15 minutes without increased pain Baseline:  Goal status: In progress 06/18/23  4.  Patient will be independent with basic exercise program Baseline:  Goal status: INITIAL  5. Pt will tolerate full aquatic sessions consistently without increase in pain and with improving function to demonstrate good toleration and effectiveness of intervention.   Baseline Goal status: New    LONG TERM GOALS: Target date: 08/28/23    1. Patient will go up and down 8 steps with reciprocal gait pattern safely without increased pain Baseline:  Goal status: INITIAL  2.  Patient will  ambulate community distances without increased pain Baseline:  Goal status: INITIAL  3.  Patient will have a complete land and pool program to promote strength and functional mobility Baseline:  Goal status: INITIAL  4. Pt will tolerate walking to and from setting and engaging in aquatic therapy session without excessive fatigue or increase in pain to demonstrate improved toleration to activity.  Baseline: unable Goal Status: New  PLAN:  PT FREQUENCY: 2x/week  PT DURATION: 8 weeks  PLANNED INTERVENTIONS: Therapeutic exercises, Therapeutic activity, Neuromuscular re-education, Balance training, Gait training, Patient/Family education, Self Care, Joint mobilization, Stair training, DME instructions, Aquatic Therapy, Dry Needling, Electrical stimulation, Cryotherapy, Moist heat, Taping, Manual therapy, and Re-evaluation.   PLAN FOR NEXT SESSION:   Aquatics: Begin gait training.  Work on bilateral core and hip strengthening.  Work on ankle range of motion.  Work on weightbearing activity.  Consider progression of stair training when ready.  Land: Manual therapy to improve bilateral dorsiflexion.  Focused ankle stability strengthening.  Progressed to stair training and squats for functional mobility when able.  Consider instability when able to tolerate.   Geni Bers, PT 06/18/2023, 6:01 PM

## 2023-06-24 ENCOUNTER — Ambulatory Visit (HOSPITAL_BASED_OUTPATIENT_CLINIC_OR_DEPARTMENT_OTHER): Payer: BC Managed Care – PPO | Admitting: Physical Therapy

## 2023-06-30 ENCOUNTER — Ambulatory Visit (HOSPITAL_BASED_OUTPATIENT_CLINIC_OR_DEPARTMENT_OTHER): Payer: BC Managed Care – PPO | Admitting: Physical Therapy

## 2023-07-02 ENCOUNTER — Ambulatory Visit (HOSPITAL_BASED_OUTPATIENT_CLINIC_OR_DEPARTMENT_OTHER): Payer: BC Managed Care – PPO | Admitting: Physical Therapy

## 2023-07-07 ENCOUNTER — Encounter (HOSPITAL_BASED_OUTPATIENT_CLINIC_OR_DEPARTMENT_OTHER): Payer: Self-pay | Admitting: Physical Therapy

## 2023-07-07 ENCOUNTER — Ambulatory Visit (HOSPITAL_BASED_OUTPATIENT_CLINIC_OR_DEPARTMENT_OTHER): Payer: BC Managed Care – PPO | Attending: Orthopedic Surgery | Admitting: Physical Therapy

## 2023-07-07 DIAGNOSIS — M25572 Pain in left ankle and joints of left foot: Secondary | ICD-10-CM | POA: Insufficient documentation

## 2023-07-07 DIAGNOSIS — M25671 Stiffness of right ankle, not elsewhere classified: Secondary | ICD-10-CM | POA: Insufficient documentation

## 2023-07-07 DIAGNOSIS — R2689 Other abnormalities of gait and mobility: Secondary | ICD-10-CM | POA: Insufficient documentation

## 2023-07-07 DIAGNOSIS — M25571 Pain in right ankle and joints of right foot: Secondary | ICD-10-CM | POA: Diagnosis present

## 2023-07-07 DIAGNOSIS — M25672 Stiffness of left ankle, not elsewhere classified: Secondary | ICD-10-CM | POA: Diagnosis present

## 2023-07-07 NOTE — Therapy (Signed)
OUTPATIENT PHYSICAL THERAPY LOWER EXTREMITY    Patient Name: Rhonda Espinoza MRN: [id number] DOB:[birth year], 62 y.o., female Today's Date: 07/07/2023  END OF SESSION:  PT End of Session - 07/07/23 1500     Visit Number 3    Number of Visits 21    Date for PT Re-Evaluation 08/28/23    PT Start Time 1447    PT Stop Time 1528    PT Time Calculation (min) 41 min    Activity Tolerance Patient tolerated treatment well    Behavior During Therapy WFL for tasks assessed/performed              Past Medical History:  Diagnosis Date   ADD (attention deficit disorder)    Alcohol withdrawal seizure (HCC)    Alcohol-induced pancreatitis    Anxiety    Arthritis    Asthma    allergy induced per patient   Disc disorder    Hyperlipidemia    Hypertension    Pneumonia    Past Surgical History:  Procedure Laterality Date   CESAREAN SECTION     CHOLECYSTECTOMY  2022   FOOT SURGERY Left    FRACTURE SURGERY Right 07/2020   R wrist    HERNIA REPAIR     KNEE ARTHROSCOPY     KYPHOPLASTY N/A 10/13/2018   Procedure: THORACIC 12 KYPHOPLASTY;  Surgeon: Estill Bamberg, MD;  Location: [facility] OR;  Service: Orthopedics;  Laterality: N/A;   KYPHOPLASTY N/A 02/05/2021   Procedure: LUMBAR ONE  AND LUMBAR FOUR KYPHOPLASTY;  Surgeon: Estill Bamberg, MD;  Location: [facility] OR;  Service: Orthopedics;  Laterality: N/A;   Patient Active Problem List   Diagnosis Date Noted   Fatty liver, alcoholic 08/07/2022   Acute pancreatitis 08/06/2022   Acute alcoholic pancreatitis 05/29/2021   Seizures (HCC) 05/18/2021   Hypokalemia 05/18/2021   Acute encephalopathy 05/18/2021   Alcohol withdrawal seizure (HCC) 05/18/2021   ETOH abuse 05/18/2021   Surgery, elective 02/05/2021   History of T12 kyphoplasty 01/24/2019   Hip pain, acute, right 01/16/2019   Acute leg pain, right 01/16/2019   Sciatica of right side without back pain 01/16/2019   Latex precautions, history of latex allergy 11/02/2018   Lesion of right  native kidney 08/30/2018   Abnormal MRI, lumbar spine (08/14/2018) 08/23/2018   Lumbar lateral recess stenosis (Left: L2-3) (Bilateral: L3-4) (Right: L4-5) 08/23/2018   Lumbar Grade 1 Retrolisthesis of L2/L3, L3/L4, & L4/L5 08/23/2018   Grade 1 Lumbar Anterolisthesis of L5/S1 08/23/2018   Lumbar facet hypertrophy (Multilevel) (Bilateral) 08/19/2018   Lumbar foraminal stenosis (Left: L2-3, L3-4) (Bilateral: L4-5, L5-S1) 08/19/2018   Lumbar central spinal stenosis 08/19/2018   DDD (degenerative disc disease), lumbar 08/05/2018   Traumatic compression fracture of T12 (<15%) thoracic vertebra, sequela 08/05/2018   Abnormal x-ray of lumbar spine (08/02/2018) 08/05/2018   Disorder of skeletal system 08/05/2018   Pharmacologic therapy 08/05/2018   Problems influencing health status 08/05/2018   Chronic pain syndrome 08/05/2018   Class 1 obesity with body mass index (BMI) of 33.0 to 33.9 in adult 05/24/2018   Seasonal allergic rhinitis due to pollen 02/03/2017   Chronic low back pain (Bilateral) (R>L) 08/04/2016   Long term current use of opiate analgesic 08/04/2016   Long term prescription opiate use 08/04/2016   Opiate use 08/04/2016   Spondylosis without myelopathy or radiculopathy, lumbar region 08/04/2016   ADD (attention deficit disorder) 05/22/2016   GAD (generalized anxiety disorder) 05/22/2016   Hypertension, essential 05/22/2016   Panic attacks 05/22/2016  Essential hypertension 08/30/2015   Back muscle spasm 01/31/2014   Abnormal LFTs 01/31/2014   Blood glucose elevated 01/31/2014   Insomnia 01/31/2014   Lumbar facet syndrome (Bilateral) (R>L) 01/31/2014   Elevated LFTs 01/31/2014   Avitaminosis D 07/03/2011   Vitamin D deficiency 07/03/2011   HLD (hyperlipidemia) 06/23/2011   Hypertriglyceridemia 06/23/2011    PCP: Dr. Antony Haste  REFERRING PROVIDER: Dr. Jamelle Haring Daws  REFERR Diagnosis  R52 (ICD-10-CM) - Pain, unspecified  T81.89XA (ICD-10-CM) - Other complications  of procedures, not elsewhere classified, initial encounter  S82.892A (ICD-10-CM) - Other fracture of left lower leg, initial encounter for closed fracture  ING DIAG:   THERAPY DIAG:  Pain in left ankle and joints of left foot  Stiffness of right ankle, not elsewhere classified  Stiffness of left ankle, not elsewhere classified  Pain in right ankle and joints of right foot  Rationale for Evaluation and Treatment: Rehabilitation  ONSET DATE: Intital surgerry January of 2024 on right ankle. Patient can not recall date and not in chart. 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     Initial Subjective Th patient had a left ankle ORIF on 11/28/2022. The right ankel was fractured and she had to have a complete reconstruction some time in January of 2024. The patient can not remember exactly and it is not in the chart. She has been out of the boots for about a month. She also has a history of lumbar compression fx's. She has osteoporosis. She is walking without a device.  She also had a fall in which she suffered a fractured wrist  PERTINENT HISTORY: Osteoporosis; lumbar compression fx; broken wrist  PAIN:  Are you having pain? no: NPRS scale: 0/10 Pain location:  Pain description:  Aggravating factors: *weight bearing;standing and walking Relieving factors: rest    PRECAUTIONS: Fall  RED FLAGS: None   WEIGHT BEARING RESTRICTIONS: No  FALLS:  Has patient fallen in last 6 months? Yes. Number of falls 3 2 falls resulted in broen andkles and 1 in a broken wrist   LIVING ENVIRONMENT: Small steps into the house   OCCUPATION:  Retired   Hobbies:  Hotel manager , walking   PLOF: Independent  PATIENT GOALS:   Get back to regular activity. Get back to swimming  NEXT MD VISIT:  1 month   OBJECTIVE:   DIAGNOSTIC FINDINGS:  Per patient: x-rays  showing healing   PATIENT SURVEYS:  FOTO   unable to find in system 06/18/23: LEFS: 10/80  COGNITION: Overall cognitive status: Within functional limits for tasks assessed     SENSATION: Numbness in the left 3 middle toes  EDEMA:  Per visual inspection mild bilateral edema in ankles MUSCLE LENGTH:  POSTURE:   Rounded shoulders, forward head, significant left foot external rotation in sitting and standing.  PALPATION: No unexpected tenderness to palpation  LOWER EXTREMITY ROM:  Passive ROM Right eval Left eval R / L  Hip flexion     Hip extension     Hip abduction     Hip adduction     Hip internal rotation     Hip external rotation     Knee flexion     Knee extension     Ankle dorsiflexion 2 degrees Neutral 2d / 5d  Ankle plantarflexion Mild limitation at end range Mild limitation at end range  35d  Ankle inversion     Ankle eversion      (Blank rows = not tested)  LOWER EXTREMITY MMT:  MMT Right eval Left eval R / L 06/18/23  Hip flexion 22.1 20.2 34.6 / 40.3  Hip extension     Hip abduction 31.1 25.4 22.0 / 23.1  Hip adduction     Hip internal rotation     Hip external rotation     Knee flexion     Knee extension 26.8 24.4 48.4 / 39.9  Ankle dorsiflexion 4 4   Ankle plantarflexion Not test to Not tested   Ankle inversion Not tested Not tested   Ankle eversion Not tested Not tested    (Blank rows = not tested)   FUNCTIONAL TESTS:  5 times sit to stand test: Not tolerated 06/18/23  GAIT: Decreased bilateral lower extremity weightbearing.  Flexed posture.  Decreased bilateral hip flexion  06/18/23 off loading R&L wide range lateral sway  TODAY'S TREATMENT:                                                                                                                              Pt seen for aquatic therapy today.  Treatment took place in water 3.5-4.75 ft in depth at the [facility] pool. Temp of water was 91.  Pt entered/exited the pool  via chair lift.  * seated in chair lift: bicycle legs, LAQ  * with kickboard- front flutter kick, breast stroke kick - 2 lengths of pool * 4 ft without support: walking forward/ backward - multiple laps;  side stepping R/L -> with arm addct / abdct with rainbow hand floats;  farmer carry with single/ bilat rainbow hand float under water with walking backwards/ forward  * straddling yellow noodle: cycling forward with doggie paddle arms; reverse jumping jacks; cross country ski legs; cycling * alternating forward step ups with UE on rail x 5 each LE  Pt requires the buoyancy and hydrostatic pressure of water for support, and to offload joints by unweighting joint load by at least 50 % in navel deep water and by at least 75-80% in chest to neck deep water.  Viscosity of the water is needed for resistance of strengthening. Water current perturbations provides challenge to standing balance requiring increased core activation.     PATIENT EDUCATION:  Education details: intro to aquatic therapy  Person educated: Patient Education method: Explanation, Demonstration, Tactile cues, Verbal cues, and Handouts Education comprehension: verbalized understanding, returned demonstration, verbal cues required, tactile cues required, and needs further education  HOME EXERCISE PROGRAM:  [email or web address] URL: [email or web address] Date: 04/30/2023 Prepared by: Lorayne Bender  Exercises - Seated Calf Towel Stretch  - 2 x daily - 7 x weekly - 3 sets - 3 reps - 20sec  hold - Seated Heel Raise  - 2 x daily - 7 x weekly - 3 sets - 10 reps - Ankle Inversion Eversion Towel Slide  - 2 x daily -  7 x weekly - 3 sets - 10 reps  ASSESSMENT:  CLINICAL IMPRESSION: Pt is safe and independent in aquatic environment and able to take direction from therapist on deck.  No increase in pain during session.  Minor cues for form and posture. Goals are ongoing.    RECERT- Pt presents today 7 weeks post initial  evaluation due to a fall with broken ribs.  She is reassessed and certification period extended.  She is demonstrating an improvement in hip flex and knee extension  strength but slight decrease in hip abd.  She reports no ankle pain.  Left ankle ROM improved.  She ambs to session today without an assistive device.  She continues to have functional deficits due to poor balance and gait dysfunction. She is a high fall risk.  She did amb to and from setting today with difficulty.  Encouragement to bring an AD is given. She will benefit form skilled PT intervention with initial sessions being aquatic based for added safety in environment allowing improvement in strength and gait/balance before transitioning to land. She will require use of lift but will progress to using stairs.  Initial Impression Is a 62 year old female with significant history of right ankle reconstruction and left ankle ORIF.  She has been weightbearing for about 2 months and has been out of the boot for about a month.  She is currently walking without a device.  She has a history of osteoporosis including multiple multilevel compression fractures in 2020 and 2022.  At this time she does complain of low back pain.  She has expected limitations in ankle motion, strength, and functional endurance.  She would benefit from skilled therapy to improve her strength and return to prior active lifestyle.  OBJECTIVE IMPAIRMENTS: Abnormal gait, decreased activity tolerance, decreased knowledge of use of DME, difficulty walking, decreased ROM, decreased strength, and pain.   ACTIVITY LIMITATIONS: carrying, lifting, bending, sitting, standing, squatting, stairs, transfers, and locomotion level  PARTICIPATION LIMITATIONS: meal prep, cleaning, laundry, driving, shopping, community activity, , and yard work  PERSONAL FACTORS: 3+ comorbidities: Osteoporosis, multilevel lumbar fracture 2020, 2022; frequent falls  are also affecting patient's functional  outcome.   REHAB POTENTIAL: Good  CLINICAL DECISION MAKING: Evolving/moderate complexity declining general functional mobility  EVALUATION COMPLEXITY: Moderate   GOALS: Goals reviewed with patient? Yes  SHORT TERM GOALS: Target date: 07/17/23   Patient will increase gross bilateral lower extremity strength by 5 pounds. Baseline: Goal status: Met 06/18/23  2.  Patient will increase bilateral dorsiflexion motion by 5 degrees Baseline:  Goal status: In progress 06/18/23  3.  Patient will stand for greater than 15 minutes without increased pain Baseline:  Goal status: In progress 06/18/23  4.  Patient will be independent with basic exercise program Baseline:  Goal status: INITIAL  5. Pt will tolerate full aquatic sessions consistently without increase in pain and with improving function to demonstrate good toleration and effectiveness of intervention.   Baseline Goal status: New    LONG TERM GOALS: Target date: 08/28/23    1. Patient will go up and down 8 steps with reciprocal gait pattern safely without increased pain Baseline:  Goal status: INITIAL  2.  Patient will ambulate community distances without increased pain Baseline:  Goal status: INITIAL  3.  Patient will have a complete land and pool program to promote strength and functional mobility Baseline:  Goal status: INITIAL  4. Pt will tolerate walking to and from setting and engaging in aquatic therapy session without  excessive fatigue or increase in pain to demonstrate improved toleration to activity.  Baseline: unable Goal Status: New  PLAN:  PT FREQUENCY: 2x/week  PT DURATION: 8 weeks  PLANNED INTERVENTIONS: Therapeutic exercises, Therapeutic activity, Neuromuscular re-education, Balance training, Gait training, Patient/Family education, Self Care, Joint mobilization, Stair training, DME instructions, Aquatic Therapy, Dry Needling, Electrical stimulation, Cryotherapy, Moist heat, Taping, Manual  therapy, and Re-evaluation.   PLAN FOR NEXT SESSION:   Aquatics: Begin gait training.  Work on bilateral core and hip strengthening.  Work on ankle range of motion.  Work on weightbearing activity.  Consider progression of stair training when ready.  Land: Manual therapy to improve bilateral dorsiflexion.  Focused ankle stability strengthening.  Progressed to stair training and squats for functional mobility when able.  Consider instability when able to tolerate.  Mayer Camel, PTA 07/07/23 3:37 PM [facility] [address] [place], [place], [address] Phone: [phone]   Fax:  [phone]

## 2023-07-09 ENCOUNTER — Ambulatory Visit (HOSPITAL_BASED_OUTPATIENT_CLINIC_OR_DEPARTMENT_OTHER): Payer: BC Managed Care – PPO | Admitting: Physical Therapy

## 2023-07-09 ENCOUNTER — Encounter (HOSPITAL_BASED_OUTPATIENT_CLINIC_OR_DEPARTMENT_OTHER): Payer: Self-pay | Admitting: Physical Therapy

## 2023-07-09 DIAGNOSIS — R2689 Other abnormalities of gait and mobility: Secondary | ICD-10-CM

## 2023-07-09 DIAGNOSIS — M25572 Pain in left ankle and joints of left foot: Secondary | ICD-10-CM | POA: Diagnosis not present

## 2023-07-09 DIAGNOSIS — M25671 Stiffness of right ankle, not elsewhere classified: Secondary | ICD-10-CM

## 2023-07-09 DIAGNOSIS — M25672 Stiffness of left ankle, not elsewhere classified: Secondary | ICD-10-CM

## 2023-07-09 DIAGNOSIS — M25571 Pain in right ankle and joints of right foot: Secondary | ICD-10-CM

## 2023-07-09 NOTE — Therapy (Signed)
OUTPATIENT PHYSICAL THERAPY LOWER EXTREMITY    Patient Name: Rhonda Espinoza MRN: [id number] DOB:[birth year], 62 y.o., female Today's Date: 07/09/2023  END OF SESSION:  PT End of Session - 07/09/23 1547     Visit Number 4    Number of Visits 21    Date for PT Re-Evaluation 08/28/23    PT Start Time 1531    PT Stop Time 1610    PT Time Calculation (min) 39 min              Past Medical History:  Diagnosis Date   ADD (attention deficit disorder)    Alcohol withdrawal seizure (HCC)    Alcohol-induced pancreatitis    Anxiety    Arthritis    Asthma    allergy induced per patient   Disc disorder    Hyperlipidemia    Hypertension    Pneumonia    Past Surgical History:  Procedure Laterality Date   CESAREAN SECTION     CHOLECYSTECTOMY  2022   FOOT SURGERY Left    FRACTURE SURGERY Right 07/2020   R wrist    HERNIA REPAIR     KNEE ARTHROSCOPY     KYPHOPLASTY N/A 10/13/2018   Procedure: THORACIC 12 KYPHOPLASTY;  Surgeon: Estill Bamberg, MD;  Location: [facility] OR;  Service: Orthopedics;  Laterality: N/A;   KYPHOPLASTY N/A 02/05/2021   Procedure: LUMBAR ONE  AND LUMBAR FOUR KYPHOPLASTY;  Surgeon: Estill Bamberg, MD;  Location: [facility] OR;  Service: Orthopedics;  Laterality: N/A;   Patient Active Problem List   Diagnosis Date Noted   Fatty liver, alcoholic 08/07/2022   Acute pancreatitis 08/06/2022   Acute alcoholic pancreatitis 05/29/2021   Seizures (HCC) 05/18/2021   Hypokalemia 05/18/2021   Acute encephalopathy 05/18/2021   Alcohol withdrawal seizure (HCC) 05/18/2021   ETOH abuse 05/18/2021   Surgery, elective 02/05/2021   History of T12 kyphoplasty 01/24/2019   Hip pain, acute, right 01/16/2019   Acute leg pain, right 01/16/2019   Sciatica of right side without back pain 01/16/2019   Latex precautions, history of latex allergy 11/02/2018   Lesion of right native kidney 08/30/2018   Abnormal MRI, lumbar spine (08/14/2018) 08/23/2018   Lumbar lateral recess stenosis  (Left: L2-3) (Bilateral: L3-4) (Right: L4-5) 08/23/2018   Lumbar Grade 1 Retrolisthesis of L2/L3, L3/L4, & L4/L5 08/23/2018   Grade 1 Lumbar Anterolisthesis of L5/S1 08/23/2018   Lumbar facet hypertrophy (Multilevel) (Bilateral) 08/19/2018   Lumbar foraminal stenosis (Left: L2-3, L3-4) (Bilateral: L4-5, L5-S1) 08/19/2018   Lumbar central spinal stenosis 08/19/2018   DDD (degenerative disc disease), lumbar 08/05/2018   Traumatic compression fracture of T12 (<15%) thoracic vertebra, sequela 08/05/2018   Abnormal x-ray of lumbar spine (08/02/2018) 08/05/2018   Disorder of skeletal system 08/05/2018   Pharmacologic therapy 08/05/2018   Problems influencing health status 08/05/2018   Chronic pain syndrome 08/05/2018   Class 1 obesity with body mass index (BMI) of 33.0 to 33.9 in adult 05/24/2018   Seasonal allergic rhinitis due to pollen 02/03/2017   Chronic low back pain (Bilateral) (R>L) 08/04/2016   Long term current use of opiate analgesic 08/04/2016   Long term prescription opiate use 08/04/2016   Opiate use 08/04/2016   Spondylosis without myelopathy or radiculopathy, lumbar region 08/04/2016   ADD (attention deficit disorder) 05/22/2016   GAD (generalized anxiety disorder) 05/22/2016   Hypertension, essential 05/22/2016   Panic attacks 05/22/2016   Essential hypertension 08/30/2015   Back muscle spasm 01/31/2014   Abnormal LFTs 01/31/2014   Blood glucose  elevated 01/31/2014   Insomnia 01/31/2014   Lumbar facet syndrome (Bilateral) (R>L) 01/31/2014   Elevated LFTs 01/31/2014   Avitaminosis D 07/03/2011   Vitamin D deficiency 07/03/2011   HLD (hyperlipidemia) 06/23/2011   Hypertriglyceridemia 06/23/2011    PCP: Dr. Antony Haste  REFERRING PROVIDER: Dr. Jamelle Haring Daws  REFERR Diagnosis  R52 (ICD-10-CM) - Pain, unspecified  T81.89XA (ICD-10-CM) - Other complications of procedures, not elsewhere classified, initial encounter  S82.892A (ICD-10-CM) - Other fracture of left lower  leg, initial encounter for closed fracture  ING DIAG:   THERAPY DIAG:  Pain in left ankle and joints of left foot  Stiffness of right ankle, not elsewhere classified  Stiffness of left ankle, not elsewhere classified  Pain in right ankle and joints of right foot  Other abnormalities of gait and mobility  Rationale for Evaluation and Treatment: Rehabilitation  ONSET DATE: Intital surgerry January of 2024 on right ankle. Patient can not recall date and not in chart. 2nd surgery on left ankle 11/28/2022 ORIF  SUBJECTIVE:   SUBJECTIVE STATEMENT: Pt reports she was sore after last visit.  She reports it may have been unrelated, but she passed out briefly the night of last session. She has forgotten her water shoes, but plans to bring them next visit.    Initial Subjective Th patient had a left ankle ORIF on 11/28/2022. The right ankle was fractured and she had to have a complete reconstruction some time in January of 2024. The patient can not remember exactly and it is not in the chart. She has been out of the boots for about a month. She also has a history of lumbar compression fx's. She has osteoporosis. She is walking without a device.  She also had a fall in which she suffered a fractured wrist  PERTINENT HISTORY: Osteoporosis; lumbar compression fx; broken wrist  PAIN:  Are you having pain? yes: NPRS scale: 5/10  "it's fine" Pain location: lower back  Pain description:  Aggravating factors: *weight bearing;standing and walking Relieving factors: rest    PRECAUTIONS: Fall  RED FLAGS: None   WEIGHT BEARING RESTRICTIONS: No  FALLS:  Has patient fallen in last 6 months? Yes. Number of falls 3 2 falls resulted in broen andkles and 1 in a broken wrist   LIVING ENVIRONMENT: Small steps into the house   OCCUPATION:  Retired   Hobbies:  Hotel manager , walking   PLOF: Independent  PATIENT GOALS:   Get back to regular activity. Get back to swimming  NEXT MD VISIT:  1  month   OBJECTIVE:   DIAGNOSTIC FINDINGS:  Per patient: x-rays showing healing   PATIENT SURVEYS:  FOTO   unable to find in system 06/18/23: LEFS: 10/80  COGNITION: Overall cognitive status: Within functional limits for tasks assessed     SENSATION: Numbness in the left 3 middle toes  EDEMA:  Per visual inspection mild bilateral edema in ankles MUSCLE LENGTH:  POSTURE:   Rounded shoulders, forward head, significant left foot external rotation in sitting and standing.  PALPATION: No unexpected tenderness to palpation  LOWER EXTREMITY ROM:  Passive ROM Right eval Left eval R / L  Hip flexion     Hip extension     Hip abduction     Hip adduction     Hip internal rotation     Hip external rotation     Knee flexion     Knee extension     Ankle dorsiflexion 2 degrees Neutral 2d / 5d  Ankle  plantarflexion Mild limitation at end range Mild limitation at end range  35d  Ankle inversion     Ankle eversion      (Blank rows = not tested)  LOWER EXTREMITY MMT:  MMT Right eval Left eval R / L 06/18/23  Hip flexion 22.1 20.2 34.6 / 40.3  Hip extension     Hip abduction 31.1 25.4 22.0 / 23.1  Hip adduction     Hip internal rotation     Hip external rotation     Knee flexion     Knee extension 26.8 24.4 48.4 / 39.9  Ankle dorsiflexion 4 4   Ankle plantarflexion Not test to Not tested   Ankle inversion Not tested Not tested   Ankle eversion Not tested Not tested    (Blank rows = not tested)   FUNCTIONAL TESTS:  5 times sit to stand test: Not tolerated 06/18/23  GAIT: Decreased bilateral lower extremity weightbearing.  Flexed posture.  Decreased bilateral hip flexion  06/18/23 off loading R&L wide range lateral sway  TODAY'S TREATMENT:                                                                                                                              Pt seen for aquatic therapy today.  Treatment took place in water 3.5-4.75 ft in depth at the [facility] pool. Temp of water was 91.  Pt entered pool via stairs with step-to pattern /exited the pool via chair lift.  * 4 ft without support: walking forward/ backward - multiple laps;  side stepping R/L ; forward walking kicks  -> with arm addct / abdct with rainbow hand floats;  *  farmer carry with single/ bilat rainbow hand float under water with walking backwards/ forward ( challenge with balance, cues for upright posture) * straddling yellow noodle: cycling forward/backward with doggie paddle arms; * step forward/ return to neutral x 5 each LE (very challenging) * box step CW/ CCW without/ with UE on yellow noodle x 5 reps each   Pt requires the buoyancy and hydrostatic pressure of water for support, and to offload joints by unweighting joint load by at least 50 % in navel deep water and by at least 75-80% in chest to neck deep water.  Viscosity of the water is needed for resistance of strengthening. Water current perturbations provides challenge to standing balance requiring increased core activation.     PATIENT EDUCATION:  Education details: intro to aquatic therapy  Person educated: Patient Education method: Explanation, Demonstration, Tactile cues, Verbal cues, and Handouts Education comprehension: verbalized understanding, returned demonstration, verbal cues required, tactile cues required, and needs further education  HOME EXERCISE PROGRAM:  [email or web address] URL: [email or web address] Date: 04/30/2023 Prepared by: Lorayne Bender  Exercises - Seated Calf Towel Stretch  - 2 x daily - 7 x weekly - 3 sets - 3 reps - 20sec  hold - Seated Heel Raise  - 2 x daily - 7 x  weekly - 3 sets - 10 reps - Ankle Inversion Eversion Towel Slide  - 2 x daily - 7 x weekly - 3 sets - 10 reps  ASSESSMENT:  CLINICAL IMPRESSION: Pt had loss of balance with attempts at vertical suspension on noodle for cycling.  Weight shifting from ant to posterior was very challenging for her today,  as was farmer's carry with single/ bilat rainbow hand floats under water at side.  Back pain remained 5/10.  Pt requires moderate cues for more upright posture throughout session (while submerged ~70%).  Goals are ongoing.    RECERT- Pt presents today 7 weeks post initial evaluation due to a fall with broken ribs.  She is reassessed and certification period extended.  She is demonstrating an improvement in hip flex and knee extension  strength but slight decrease in hip abd.  She reports no ankle pain.  Left ankle ROM improved.  She ambs to session today without an assistive device.  She continues to have functional deficits due to poor balance and gait dysfunction. She is a high fall risk.  She did amb to and from setting today with difficulty.  Encouragement to bring an AD is given. She will benefit form skilled PT intervention with initial sessions being aquatic based for added safety in environment allowing improvement in strength and gait/balance before transitioning to land. She will require use of lift but will progress to using stairs.  Initial Impression Is a 62 year old female with significant history of right ankle reconstruction and left ankle ORIF.  She has been weightbearing for about 2 months and has been out of the boot for about a month.  She is currently walking without a device.  She has a history of osteoporosis including multiple multilevel compression fractures in 2020 and 2022.  At this time she does complain of low back pain.  She has expected limitations in ankle motion, strength, and functional endurance.  She would benefit from skilled therapy to improve her strength and return to prior active lifestyle.  OBJECTIVE IMPAIRMENTS: Abnormal gait, decreased activity tolerance, decreased knowledge of use of DME, difficulty walking, decreased ROM, decreased strength, and pain.   ACTIVITY LIMITATIONS: carrying, lifting, bending, sitting, standing, squatting, stairs, transfers, and  locomotion level  PARTICIPATION LIMITATIONS: meal prep, cleaning, laundry, driving, shopping, community activity, , and yard work  PERSONAL FACTORS: 3+ comorbidities: Osteoporosis, multilevel lumbar fracture 2020, 2022; frequent falls  are also affecting patient's functional outcome.   REHAB POTENTIAL: Good  CLINICAL DECISION MAKING: Evolving/moderate complexity declining general functional mobility  EVALUATION COMPLEXITY: Moderate   GOALS: Goals reviewed with patient? Yes  SHORT TERM GOALS: Target date: 07/17/23   Patient will increase gross bilateral lower extremity strength by 5 pounds. Baseline: Goal status: Met 06/18/23  2.  Patient will increase bilateral dorsiflexion motion by 5 degrees Baseline:  Goal status: In progress 06/18/23  3.  Patient will stand for greater than 15 minutes without increased pain Baseline:  Goal status: In progress 06/18/23  4.  Patient will be independent with basic exercise program Baseline:  Goal status: INITIAL  5. Pt will tolerate full aquatic sessions consistently without increase in pain and with improving function to demonstrate good toleration and effectiveness of intervention.   Baseline Goal status: New    LONG TERM GOALS: Target date: 08/28/23    1. Patient will go up and down 8 steps with reciprocal gait pattern safely without increased pain Baseline:  Goal status: INITIAL  2.  Patient will ambulate community  distances without increased pain Baseline:  Goal status: INITIAL  3.  Patient will have a complete land and pool program to promote strength and functional mobility Baseline:  Goal status: INITIAL  4. Pt will tolerate walking to and from setting and engaging in aquatic therapy session without excessive fatigue or increase in pain to demonstrate improved toleration to activity.  Baseline: unable Goal Status: New  PLAN:  PT FREQUENCY: 2x/week  PT DURATION: 8 weeks  PLANNED INTERVENTIONS: Therapeutic  exercises, Therapeutic activity, Neuromuscular re-education, Balance training, Gait training, Patient/Family education, Self Care, Joint mobilization, Stair training, DME instructions, Aquatic Therapy, Dry Needling, Electrical stimulation, Cryotherapy, Moist heat, Taping, Manual therapy, and Re-evaluation.   PLAN FOR NEXT SESSION:   Aquatics: Begin gait training.  Work on bilateral core and hip strengthening.  Work on ankle range of motion.  Work on weightbearing activity.  Consider progression of stair training when ready.  Land: Manual therapy to improve bilateral dorsiflexion.  Focused ankle stability strengthening.  Progressed to stair training and squats for functional mobility when able.  Consider instability when able to tolerate.  Mayer Camel, PTA 07/09/23 6:10 PM [facility] [address] [place], [place], [address] Phone: [phone]   Fax:  [phone]

## 2023-07-14 ENCOUNTER — Encounter (HOSPITAL_BASED_OUTPATIENT_CLINIC_OR_DEPARTMENT_OTHER): Payer: Self-pay

## 2023-07-14 ENCOUNTER — Ambulatory Visit (HOSPITAL_BASED_OUTPATIENT_CLINIC_OR_DEPARTMENT_OTHER): Payer: BC Managed Care – PPO | Admitting: Physical Therapy

## 2023-07-16 ENCOUNTER — Ambulatory Visit (HOSPITAL_BASED_OUTPATIENT_CLINIC_OR_DEPARTMENT_OTHER): Payer: BC Managed Care – PPO | Admitting: Physical Therapy

## 2023-07-16 ENCOUNTER — Encounter (HOSPITAL_BASED_OUTPATIENT_CLINIC_OR_DEPARTMENT_OTHER): Payer: Self-pay | Admitting: Physical Therapy

## 2023-07-16 DIAGNOSIS — M25671 Stiffness of right ankle, not elsewhere classified: Secondary | ICD-10-CM

## 2023-07-16 DIAGNOSIS — M25672 Stiffness of left ankle, not elsewhere classified: Secondary | ICD-10-CM

## 2023-07-16 DIAGNOSIS — M25572 Pain in left ankle and joints of left foot: Secondary | ICD-10-CM

## 2023-07-16 NOTE — Therapy (Signed)
OUTPATIENT PHYSICAL THERAPY LOWER EXTREMITY    Patient Name: Rhonda Espinoza MRN: [id number] DOB:[birth year], 62 y.o., female Today's Date: 07/16/2023  END OF SESSION:  PT End of Session - 07/16/23 1538     Visit Number 5    Number of Visits 21    Date for PT Re-Evaluation 08/28/23    PT Start Time 1536    PT Stop Time 1615    PT Time Calculation (min) 39 min    Activity Tolerance Patient tolerated treatment well    Behavior During Therapy WFL for tasks assessed/performed              Past Medical History:  Diagnosis Date   ADD (attention deficit disorder)    Alcohol withdrawal seizure (HCC)    Alcohol-induced pancreatitis    Anxiety    Arthritis    Asthma    allergy induced per patient   Disc disorder    Hyperlipidemia    Hypertension    Pneumonia    Past Surgical History:  Procedure Laterality Date   CESAREAN SECTION     CHOLECYSTECTOMY  2022   FOOT SURGERY Left    FRACTURE SURGERY Right 07/2020   R wrist    HERNIA REPAIR     KNEE ARTHROSCOPY     KYPHOPLASTY N/A 10/13/2018   Procedure: THORACIC 12 KYPHOPLASTY;  Surgeon: Estill Bamberg, MD;  Location: [facility] OR;  Service: Orthopedics;  Laterality: N/A;   KYPHOPLASTY N/A 02/05/2021   Procedure: LUMBAR ONE  AND LUMBAR FOUR KYPHOPLASTY;  Surgeon: Estill Bamberg, MD;  Location: [facility] OR;  Service: Orthopedics;  Laterality: N/A;   Patient Active Problem List   Diagnosis Date Noted   Fatty liver, alcoholic 08/07/2022   Acute pancreatitis 08/06/2022   Acute alcoholic pancreatitis 05/29/2021   Seizures (HCC) 05/18/2021   Hypokalemia 05/18/2021   Acute encephalopathy 05/18/2021   Alcohol withdrawal seizure (HCC) 05/18/2021   ETOH abuse 05/18/2021   Surgery, elective 02/05/2021   History of T12 kyphoplasty 01/24/2019   Hip pain, acute, right 01/16/2019   Acute leg pain, right 01/16/2019   Sciatica of right side without back pain 01/16/2019   Latex precautions, history of latex allergy 11/02/2018   Lesion of right  native kidney 08/30/2018   Abnormal MRI, lumbar spine (08/14/2018) 08/23/2018   Lumbar lateral recess stenosis (Left: L2-3) (Bilateral: L3-4) (Right: L4-5) 08/23/2018   Lumbar Grade 1 Retrolisthesis of L2/L3, L3/L4, & L4/L5 08/23/2018   Grade 1 Lumbar Anterolisthesis of L5/S1 08/23/2018   Lumbar facet hypertrophy (Multilevel) (Bilateral) 08/19/2018   Lumbar foraminal stenosis (Left: L2-3, L3-4) (Bilateral: L4-5, L5-S1) 08/19/2018   Lumbar central spinal stenosis 08/19/2018   DDD (degenerative disc disease), lumbar 08/05/2018   Traumatic compression fracture of T12 (<15%) thoracic vertebra, sequela 08/05/2018   Abnormal x-ray of lumbar spine (08/02/2018) 08/05/2018   Disorder of skeletal system 08/05/2018   Pharmacologic therapy 08/05/2018   Problems influencing health status 08/05/2018   Chronic pain syndrome 08/05/2018   Class 1 obesity with body mass index (BMI) of 33.0 to 33.9 in adult 05/24/2018   Seasonal allergic rhinitis due to pollen 02/03/2017   Chronic low back pain (Bilateral) (R>L) 08/04/2016   Long term current use of opiate analgesic 08/04/2016   Long term prescription opiate use 08/04/2016   Opiate use 08/04/2016   Spondylosis without myelopathy or radiculopathy, lumbar region 08/04/2016   ADD (attention deficit disorder) 05/22/2016   GAD (generalized anxiety disorder) 05/22/2016   Hypertension, essential 05/22/2016   Panic attacks 05/22/2016  Essential hypertension 08/30/2015   Back muscle spasm 01/31/2014   Abnormal LFTs 01/31/2014   Blood glucose elevated 01/31/2014   Insomnia 01/31/2014   Lumbar facet syndrome (Bilateral) (R>L) 01/31/2014   Elevated LFTs 01/31/2014   Avitaminosis D 07/03/2011   Vitamin D deficiency 07/03/2011   HLD (hyperlipidemia) 06/23/2011   Hypertriglyceridemia 06/23/2011    PCP: Dr. Antony Haste  REFERRING PROVIDER: Dr. Jamelle Haring Daws  REFERR Diagnosis  R52 (ICD-10-CM) - Pain, unspecified  T81.89XA (ICD-10-CM) - Other complications  of procedures, not elsewhere classified, initial encounter  S82.892A (ICD-10-CM) - Other fracture of left lower leg, initial encounter for closed fracture  ING DIAG:   THERAPY DIAG:  Pain in left ankle and joints of left foot  Stiffness of right ankle, not elsewhere classified  Stiffness of left ankle, not elsewhere classified  Rationale for Evaluation and Treatment: Rehabilitation  ONSET DATE: Intital surgerry January of 2024 on right ankle. Patient can not recall date and not in chart. 2nd surgery on left ankle 11/28/2022 ORIF  SUBJECTIVE:   SUBJECTIVE STATEMENT: "Pt reports increased back pain, doesn't know why.  Was sick last miss session"   Initial Subjective Th patient had a left ankle ORIF on 11/28/2022. The right ankle was fractured and she had to have a complete reconstruction some time in January of 2024. The patient can not remember exactly and it is not in the chart. She has been out of the boots for about a month. She also has a history of lumbar compression fx's. She has osteoporosis. She is walking without a device.  She also had a fall in which she suffered a fractured wrist  PERTINENT HISTORY: Osteoporosis; lumbar compression fx; broken wrist  PAIN:  Are you having pain? yes: NPRS scale: 6/10   Pain location: lower back  Pain description:  Aggravating factors: *weight bearing;standing and walking Relieving factors: rest    PRECAUTIONS: Fall  RED FLAGS: None   WEIGHT BEARING RESTRICTIONS: No  FALLS:  Has patient fallen in last 6 months? Yes. Number of falls 3 2 falls resulted in broen andkles and 1 in a broken wrist   LIVING ENVIRONMENT: Small steps into the house   OCCUPATION:  Retired   Hobbies:  Hotel manager , walking   PLOF: Independent  PATIENT GOALS:   Get back to regular activity. Get back to swimming  NEXT MD VISIT:  1 month   OBJECTIVE:   DIAGNOSTIC FINDINGS:  Per patient: x-rays showing healing   PATIENT SURVEYS:  FOTO   unable  to find in system 06/18/23: LEFS: 10/80  COGNITION: Overall cognitive status: Within functional limits for tasks assessed     SENSATION: Numbness in the left 3 middle toes  EDEMA:  Per visual inspection mild bilateral edema in ankles MUSCLE LENGTH:  POSTURE:   Rounded shoulders, forward head, significant left foot external rotation in sitting and standing.  PALPATION: No unexpected tenderness to palpation  LOWER EXTREMITY ROM:  Passive ROM Right eval Left eval R / L  Hip flexion     Hip extension     Hip abduction     Hip adduction     Hip internal rotation     Hip external rotation     Knee flexion     Knee extension     Ankle dorsiflexion 2 degrees Neutral 2d / 5d  Ankle plantarflexion Mild limitation at end range Mild limitation at end range  35d  Ankle inversion     Ankle eversion      (  Blank rows = not tested)  LOWER EXTREMITY MMT:  MMT Right eval Left eval R / L 06/18/23  Hip flexion 22.1 20.2 34.6 / 40.3  Hip extension     Hip abduction 31.1 25.4 22.0 / 23.1  Hip adduction     Hip internal rotation     Hip external rotation     Knee flexion     Knee extension 26.8 24.4 48.4 / 39.9  Ankle dorsiflexion 4 4   Ankle plantarflexion Not test to Not tested   Ankle inversion Not tested Not tested   Ankle eversion Not tested Not tested    (Blank rows = not tested)   FUNCTIONAL TESTS:  5 times sit to stand test: Not tolerated 06/18/23  GAIT: Decreased bilateral lower extremity weightbearing.  Flexed posture.  Decreased bilateral hip flexion  06/18/23 off loading R&L wide range lateral sway  TODAY'S TREATMENT:                                                                                                                              Pt seen for aquatic therapy today.  Treatment took place in water 3.5-4.75 ft in depth at the [facility] pool. Temp of water was 91.  Pt entered pool via stairs with step-to pattern /exited the pool via chair  lift.  * 4 ft without support: walking forward/ backward - multiple laps;  side stepping R/L ; forward walking kicks  -> with arm addct / abdct with rainbow hand floats;  *  farmer carry with single/ bilat rainbow hand float under water with walking backwards/ forward  *return to walking forward. Cues for heel strike *Standing ue on wall 4.0 ft df x 10; PF x 10 as tolerated; high knee marching; hip add/abd * step forward/ return to neutral x 5 each LE (very challenging) ue support rainbow hb * straddling yellow noodle: cycling forward/backward with doggie paddle arms: 4 widths. Ue support on corner wall hip add/abd x 20; skiing x 20 *TrA set solid noodle pull down wide stance  x 10 then staggered x 5   Pt requires the buoyancy and hydrostatic pressure of water for support, and to offload joints by unweighting joint load by at least 50 % in navel deep water and by at least 75-80% in chest to neck deep water.  Viscosity of the water is needed for resistance of strengthening. Water current perturbations provides challenge to standing balance requiring increased core activation.     PATIENT EDUCATION:  Education details: intro to aquatic therapy  Person educated: Patient Education method: Explanation, Demonstration, Tactile cues, Verbal cues, and Handouts Education comprehension: verbalized understanding, returned demonstration, verbal cues required, tactile cues required, and needs further education  HOME EXERCISE PROGRAM:  [email or web address] URL: [email or web address] Date: 04/30/2023 Prepared by: Lorayne Bender  Exercises - Seated Calf Towel Stretch  - 2 x daily - 7 x weekly - 3 sets - 3 reps - 20sec  hold -  Seated Heel Raise  - 2 x daily - 7 x weekly - 3 sets - 10 reps - Ankle Inversion Eversion Towel Slide  - 2 x daily - 7 x weekly - 3 sets - 10 reps  ASSESSMENT:  CLINICAL IMPRESSION: Continued difficulty with forward to backward weight shift. While she is challenged by  balance I do believe she has some lack of coordination increasing difficulty. No LOB today.  Able to maintain upright posture with farmers carry without difficulty. She has been amb to and from setting without AD but does report pain and has increased fall risk.  She has been encouraged to use but has chosen not.  Goals ongoing    RECERT- Pt presents today 7 weeks post initial evaluation due to a fall with broken ribs.  She is reassessed and certification period extended.  She is demonstrating an improvement in hip flex and knee extension  strength but slight decrease in hip abd.  She reports no ankle pain.  Left ankle ROM improved.  She ambs to session today without an assistive device.  She continues to have functional deficits due to poor balance and gait dysfunction. She is a high fall risk.  She did amb to and from setting today with difficulty.  Encouragement to bring an AD is given. She will benefit form skilled PT intervention with initial sessions being aquatic based for added safety in environment allowing improvement in strength and gait/balance before transitioning to land. She will require use of lift but will progress to using stairs.    OBJECTIVE IMPAIRMENTS: Abnormal gait, decreased activity tolerance, decreased knowledge of use of DME, difficulty walking, decreased ROM, decreased strength, and pain.   ACTIVITY LIMITATIONS: carrying, lifting, bending, sitting, standing, squatting, stairs, transfers, and locomotion level  PARTICIPATION LIMITATIONS: meal prep, cleaning, laundry, driving, shopping, community activity, , and yard work  PERSONAL FACTORS: 3+ comorbidities: Osteoporosis, multilevel lumbar fracture 2020, 2022; frequent falls  are also affecting patient's functional outcome.   REHAB POTENTIAL: Good  CLINICAL DECISION MAKING: Evolving/moderate complexity declining general functional mobility  EVALUATION COMPLEXITY: Moderate   GOALS: Goals reviewed with patient?  Yes  SHORT TERM GOALS: Target date: 07/17/23   Patient will increase gross bilateral lower extremity strength by 5 pounds. Baseline: Goal status: Met 06/18/23  2.  Patient will increase bilateral dorsiflexion motion by 5 degrees Baseline:  Goal status: In progress 06/18/23  3.  Patient will stand for greater than 15 minutes without increased pain Baseline:  Goal status: In progress 06/18/23  4.  Patient will be independent with basic exercise program Baseline:  Goal status: INITIAL  5. Pt will tolerate full aquatic sessions consistently without increase in pain and with improving function to demonstrate good toleration and effectiveness of intervention.   Baseline Goal status: Met 07/16/23    LONG TERM GOALS: Target date: 08/28/23    1. Patient will go up and down 8 steps with reciprocal gait pattern safely without increased pain Baseline:  Goal status: INITIAL  2.  Patient will ambulate community distances without increased pain Baseline:  Goal status: INITIAL  3.  Patient will have a complete land and pool program to promote strength and functional mobility Baseline:  Goal status: INITIAL  4. Pt will tolerate walking to and from setting and engaging in aquatic therapy session without excessive fatigue or increase in pain to demonstrate improved toleration to activity.  Baseline: unable Goal Status: In progress 07/16/23  PLAN:  PT FREQUENCY: 2x/week  PT DURATION: 8 weeks  PLANNED INTERVENTIONS: Therapeutic exercises, Therapeutic activity, Neuromuscular re-education, Balance training, Gait training, Patient/Family education, Self Care, Joint mobilization, Stair training, DME instructions, Aquatic Therapy, Dry Needling, Electrical stimulation, Cryotherapy, Moist heat, Taping, Manual therapy, and Re-evaluation.   PLAN FOR NEXT SESSION:   Aquatics: Begin gait training.  Work on bilateral core and hip strengthening.  Work on ankle range of motion.  Work on  weightbearing activity.  Consider progression of stair training when ready.  Land: Manual therapy to improve bilateral dorsiflexion.  Focused ankle stability strengthening.  Progressed to stair training and squats for functional mobility when able.  Consider instability when able to tolerate.  Corrie Dandy [place]) Lyndy Russman MPT 07/16/23 5:55 PM [facility] [address] [place], [place], [address] Phone: [phone]   Fax:  [phone]

## 2023-07-16 NOTE — Progress Notes (Signed)
Spoke with patient via telephone for preop interview. Pateint stated she was not sure she was going to proceed with the procedure. Pt to call Dr. Huel Coventry office to discuss it.

## 2023-07-21 ENCOUNTER — Ambulatory Visit (HOSPITAL_BASED_OUTPATIENT_CLINIC_OR_DEPARTMENT_OTHER): Payer: BC Managed Care – PPO | Admitting: Physical Therapy

## 2023-07-21 DIAGNOSIS — M25572 Pain in left ankle and joints of left foot: Secondary | ICD-10-CM | POA: Diagnosis not present

## 2023-07-21 DIAGNOSIS — M25671 Stiffness of right ankle, not elsewhere classified: Secondary | ICD-10-CM

## 2023-07-21 DIAGNOSIS — M25571 Pain in right ankle and joints of right foot: Secondary | ICD-10-CM

## 2023-07-21 DIAGNOSIS — M25672 Stiffness of left ankle, not elsewhere classified: Secondary | ICD-10-CM

## 2023-07-21 DIAGNOSIS — R2689 Other abnormalities of gait and mobility: Secondary | ICD-10-CM

## 2023-07-21 NOTE — Therapy (Signed)
OUTPATIENT PHYSICAL THERAPY LOWER EXTREMITY    Patient Name: Rhonda Espinoza MRN: [id number] DOB:[birth year], 62 y.o., female Today's Date: 07/22/2023  END OF SESSION:  PT End of Session - 07/22/23 0907     Visit Number 6    Number of Visits 21    Date for PT Re-Evaluation 08/28/23    PT Start Time 1555    PT Stop Time 1635    PT Time Calculation (min) 40 min    Activity Tolerance Patient tolerated treatment well    Behavior During Therapy WFL for tasks assessed/performed               Past Medical History:  Diagnosis Date   ADD (attention deficit disorder)    Alcohol withdrawal seizure (HCC)    Alcohol-induced pancreatitis    Anxiety    Arthritis    Asthma    allergy induced per patient   Disc disorder    Hyperlipidemia    Hypertension    Pneumonia    Past Surgical History:  Procedure Laterality Date   CESAREAN SECTION     CHOLECYSTECTOMY  2022   FOOT SURGERY Left    FRACTURE SURGERY Right 07/2020   R wrist    HERNIA REPAIR     KNEE ARTHROSCOPY     KYPHOPLASTY N/A 10/13/2018   Procedure: THORACIC 12 KYPHOPLASTY;  Surgeon: Estill Bamberg, MD;  Location: [facility] OR;  Service: Orthopedics;  Laterality: N/A;   KYPHOPLASTY N/A 02/05/2021   Procedure: LUMBAR ONE  AND LUMBAR FOUR KYPHOPLASTY;  Surgeon: Estill Bamberg, MD;  Location: [facility] OR;  Service: Orthopedics;  Laterality: N/A;   Patient Active Problem List   Diagnosis Date Noted   Fatty liver, alcoholic 08/07/2022   Acute pancreatitis 08/06/2022   Acute alcoholic pancreatitis 05/29/2021   Seizures (HCC) 05/18/2021   Hypokalemia 05/18/2021   Acute encephalopathy 05/18/2021   Alcohol withdrawal seizure (HCC) 05/18/2021   ETOH abuse 05/18/2021   Surgery, elective 02/05/2021   History of T12 kyphoplasty 01/24/2019   Hip pain, acute, right 01/16/2019   Acute leg pain, right 01/16/2019   Sciatica of right side without back pain 01/16/2019   Latex precautions, history of latex allergy 11/02/2018   Lesion of right  native kidney 08/30/2018   Abnormal MRI, lumbar spine (08/14/2018) 08/23/2018   Lumbar lateral recess stenosis (Left: L2-3) (Bilateral: L3-4) (Right: L4-5) 08/23/2018   Lumbar Grade 1 Retrolisthesis of L2/L3, L3/L4, & L4/L5 08/23/2018   Grade 1 Lumbar Anterolisthesis of L5/S1 08/23/2018   Lumbar facet hypertrophy (Multilevel) (Bilateral) 08/19/2018   Lumbar foraminal stenosis (Left: L2-3, L3-4) (Bilateral: L4-5, L5-S1) 08/19/2018   Lumbar central spinal stenosis 08/19/2018   DDD (degenerative disc disease), lumbar 08/05/2018   Traumatic compression fracture of T12 (<15%) thoracic vertebra, sequela 08/05/2018   Abnormal x-ray of lumbar spine (08/02/2018) 08/05/2018   Disorder of skeletal system 08/05/2018   Pharmacologic therapy 08/05/2018   Problems influencing health status 08/05/2018   Chronic pain syndrome 08/05/2018   Class 1 obesity with body mass index (BMI) of 33.0 to 33.9 in adult 05/24/2018   Seasonal allergic rhinitis due to pollen 02/03/2017   Chronic low back pain (Bilateral) (R>L) 08/04/2016   Long term current use of opiate analgesic 08/04/2016   Long term prescription opiate use 08/04/2016   Opiate use 08/04/2016   Spondylosis without myelopathy or radiculopathy, lumbar region 08/04/2016   ADD (attention deficit disorder) 05/22/2016   GAD (generalized anxiety disorder) 05/22/2016   Hypertension, essential 05/22/2016   Panic attacks 05/22/2016  Essential hypertension 08/30/2015   Back muscle spasm 01/31/2014   Abnormal LFTs 01/31/2014   Blood glucose elevated 01/31/2014   Insomnia 01/31/2014   Lumbar facet syndrome (Bilateral) (R>L) 01/31/2014   Elevated LFTs 01/31/2014   Avitaminosis D 07/03/2011   Vitamin D deficiency 07/03/2011   HLD (hyperlipidemia) 06/23/2011   Hypertriglyceridemia 06/23/2011    PCP: Dr. Antony Haste  REFERRING PROVIDER: Dr. Jamelle Haring Daws  REFERR Diagnosis  R52 (ICD-10-CM) - Pain, unspecified  T81.89XA (ICD-10-CM) - Other complications  of procedures, not elsewhere classified, initial encounter  S82.892A (ICD-10-CM) - Other fracture of left lower leg, initial encounter for closed fracture  ING DIAG:   THERAPY DIAG:  Pain in left ankle and joints of left foot  Stiffness of right ankle, not elsewhere classified  Stiffness of left ankle, not elsewhere classified  Pain in right ankle and joints of right foot  Other abnormalities of gait and mobility  Rationale for Evaluation and Treatment: Rehabilitation  ONSET DATE: Intital surgerry January of 2024 on right ankle. Patient can not recall date and not in chart. 2nd surgery on left ankle 11/28/2022 ORIF  SUBJECTIVE:   SUBJECTIVE STATEMENT: The patient reports her right ankle/foot has been really sore. She is not sure what the reason is. She has been walking without a device. She does have a cane.     Initial Subjective Th patient had a left ankle ORIF on 11/28/2022. The right ankle was fractured and she had to have a complete reconstruction some time in January of 2024. The patient can not remember exactly and it is not in the chart. She has been out of the boots for about a month. She also has a history of lumbar compression fx's. She has osteoporosis. She is walking without a device.  She also had a fall in which she suffered a fractured wrist  PERTINENT HISTORY: Osteoporosis; lumbar compression fx; broken wrist  PAIN:  Are you having pain? yes: NPRS scale: 6/10   Pain location: lower back  Pain description:  Aggravating factors: *weight bearing;standing and walking Relieving factors: rest    PRECAUTIONS: Fall  RED FLAGS: None   WEIGHT BEARING RESTRICTIONS: No  FALLS:  Has patient fallen in last 6 months? Yes. Number of falls 3 2 falls resulted in broen andkles and 1 in a broken wrist   LIVING ENVIRONMENT: Small steps into the house   OCCUPATION:  Retired   Hobbies:  Hotel manager , walking   PLOF: Independent  PATIENT GOALS:   Get back to regular  activity. Get back to swimming  NEXT MD VISIT:  1 month   OBJECTIVE:   DIAGNOSTIC FINDINGS:  Per patient: x-rays showing healing   PATIENT SURVEYS:  FOTO   unable to find in system 06/18/23: LEFS: 10/80  COGNITION: Overall cognitive status: Within functional limits for tasks assessed     SENSATION: Numbness in the left 3 middle toes  EDEMA:  Per visual inspection mild bilateral edema in ankles MUSCLE LENGTH:  POSTURE:   Rounded shoulders, forward head, significant left foot external rotation in sitting and standing.  PALPATION: No unexpected tenderness to palpation  LOWER EXTREMITY ROM:  Passive ROM Right eval Left eval R / L  Hip flexion     Hip extension     Hip abduction     Hip adduction     Hip internal rotation     Hip external rotation     Knee flexion     Knee extension     Ankle  dorsiflexion 2 degrees Neutral 2d / 5d  Ankle plantarflexion Mild limitation at end range Mild limitation at end range  35d  Ankle inversion     Ankle eversion      (Blank rows = not tested)  LOWER EXTREMITY MMT:  MMT Right eval Left eval R / L 06/18/23  Hip flexion 22.1 20.2 34.6 / 40.3  Hip extension     Hip abduction 31.1 25.4 22.0 / 23.1  Hip adduction     Hip internal rotation     Hip external rotation     Knee flexion     Knee extension 26.8 24.4 48.4 / 39.9  Ankle dorsiflexion 4 4   Ankle plantarflexion Not test to Not tested   Ankle inversion Not tested Not tested   Ankle eversion Not tested Not tested    (Blank rows = not tested)   FUNCTIONAL TESTS:  5 times sit to stand test: Not tolerated 06/18/23  GAIT: Decreased bilateral lower extremity weightbearing.  Flexed posture.  Decreased bilateral hip flexion  06/18/23 off loading R&L wide range lateral sway  TODAY'S TREATMENT:                                                                                                                              10/22 Gait training: Ambulated with single-point  cane on left side.  Min cueing for proper gait technique.  Improve gait technique noted with practice.  100 feet performed.  Manual: Trigger point release to medial gastroc and medial ankle area.  Gentle dorsiflexion stretching.  Review sense of self lysed them to this area.  Trigger point release to the medial plantar fasciitis insertion  Ankle inversion/eversion no resistance 2 x 10  Ankle plantarflexion 2 x 10 yellow band  Seated hip abduction red 2 x 15 Seated long arc quad 2 x 15 red Seated march 2 x 15 red  Initially started all exercises with yellow but RPE on each was 2-3.  Band adjusted to red RPE around 4-5 Updated HEP.    Last visit:Pt seen for aquatic therapy today.  Treatment took place in water 3.5-4.75 ft in depth at the [facility] pool. Temp of water was 91.  Pt entered pool via stairs with step-to pattern /exited the pool via chair lift.  * 4 ft without support: walking forward/ backward - multiple laps;  side stepping R/L ; forward walking kicks  -> with arm addct / abdct with rainbow hand floats;  *  farmer carry with single/ bilat rainbow hand float under water with walking backwards/ forward  *return to walking forward. Cues for heel strike *Standing ue on wall 4.0 ft df x 10; PF x 10 as tolerated; high knee marching; hip add/abd * step forward/ return to neutral x 5 each LE (very challenging) ue support rainbow hb * straddling yellow noodle: cycling forward/backward with doggie paddle arms: 4 widths. Ue support on corner wall hip add/abd x 20; skiing  x 20 *TrA set solid noodle pull down wide stance  x 10 then staggered x 5   Pt requires the buoyancy and hydrostatic pressure of water for support, and to offload joints by unweighting joint load by at least 50 % in navel deep water and by at least 75-80% in chest to neck deep water.  Viscosity of the water is needed for resistance of strengthening. Water current perturbations provides challenge to standing  balance requiring increased core activation.     PATIENT EDUCATION:  Education details: intro to aquatic therapy  Person educated: Patient Education method: Explanation, Demonstration, Tactile cues, Verbal cues, and Handouts Education comprehension: verbalized understanding, returned demonstration, verbal cues required, tactile cues required, and needs further education  HOME EXERCISE PROGRAM:  [email or web address] URL: [email or web address] Date: 04/30/2023 Prepared by: Lorayne Bender  Exercises - Seated Calf Towel Stretch  - 2 x daily - 7 x weekly - 3 sets - 3 reps - 20sec  hold - Seated Heel Raise  - 2 x daily - 7 x weekly - 3 sets - 10 reps - Ankle Inversion Eversion Towel Slide  - 2 x daily - 7 x weekly - 3 sets - 10 reps  ASSESSMENT:  CLINICAL IMPRESSION: Patient tolerated treatment well.  She has significant trigger points in her medial calf area bilateral.  Trigger points worse on the right side.  We worked on increasing dorsiflexion.  She does report some pain of the Achilles on the left side.  Therapy will address this next visit.  Patient given strengthening exercises to improve plantar pain and medial ankle pain.  Patient also given gross lower extremity strengthening exercises.  She is motivated to work on her exercises.   RECERT- Pt presents today 7 weeks post initial evaluation due to a fall with broken ribs.  She is reassessed and certification period extended.  She is demonstrating an improvement in hip flex and knee extension  strength but slight decrease in hip abd.  She reports no ankle pain.  Left ankle ROM improved.  She ambs to session today without an assistive device.  She continues to have functional deficits due to poor balance and gait dysfunction. She is a high fall risk.  She did amb to and from setting today with difficulty.  Encouragement to bring an AD is given. She will benefit form skilled PT intervention with initial sessions being aquatic based for  added safety in environment allowing improvement in strength and gait/balance before transitioning to land. She will require use of lift but will progress to using stairs.    OBJECTIVE IMPAIRMENTS: Abnormal gait, decreased activity tolerance, decreased knowledge of use of DME, difficulty walking, decreased ROM, decreased strength, and pain.   ACTIVITY LIMITATIONS: carrying, lifting, bending, sitting, standing, squatting, stairs, transfers, and locomotion level  PARTICIPATION LIMITATIONS: meal prep, cleaning, laundry, driving, shopping, community activity, , and yard work  PERSONAL FACTORS: 3+ comorbidities: Osteoporosis, multilevel lumbar fracture 2020, 2022; frequent falls  are also affecting patient's functional outcome.   REHAB POTENTIAL: Good  CLINICAL DECISION MAKING: Evolving/moderate complexity declining general functional mobility  EVALUATION COMPLEXITY: Moderate   GOALS: Goals reviewed with patient? Yes  SHORT TERM GOALS: Target date: 07/17/23   Patient will increase gross bilateral lower extremity strength by 5 pounds. Baseline: Goal status: Met 06/18/23  2.  Patient will increase bilateral dorsiflexion motion by 5 degrees Baseline:  Goal status: In progress 06/18/23  3.  Patient will stand for greater than 15 minutes without increased pain Baseline:  Goal  status: In progress 06/18/23  4.  Patient will be independent with basic exercise program Baseline:  Goal status: Initiated general strengthening exercises 10/23  5. Pt will tolerate full aquatic sessions consistently without increase in pain and with improving function to demonstrate good toleration and effectiveness of intervention.   Baseline Goal status: Met 07/16/23    LONG TERM GOALS: Target date: 08/28/23    1. Patient will go up and down 8 steps with reciprocal gait pattern safely without increased pain Baseline:  Goal status: INITIAL  2.  Patient will ambulate community distances without  increased pain Baseline:  Goal status: INITIAL  3.  Patient will have a complete land and pool program to promote strength and functional mobility Baseline:  Goal status: INITIAL  4. Pt will tolerate walking to and from setting and engaging in aquatic therapy session without excessive fatigue or increase in pain to demonstrate improved toleration to activity.  Baseline: unable Goal Status: In progress 07/16/23  PLAN:  PT FREQUENCY: 2x/week  PT DURATION: 8 weeks  PLANNED INTERVENTIONS: Therapeutic exercises, Therapeutic activity, Neuromuscular re-education, Balance training, Gait training, Patient/Family education, Self Care, Joint mobilization, Stair training, DME instructions, Aquatic Therapy, Dry Needling, Electrical stimulation, Cryotherapy, Moist heat, Taping, Manual therapy, and Re-evaluation.   PLAN FOR NEXT SESSION:   Aquatics: Begin gait training.  Work on bilateral core and hip strengthening.  Work on ankle range of motion.  Work on weightbearing activity.  Consider progression of stair training when ready.  Land: Manual therapy to improve bilateral dorsiflexion.  Focused ankle stability strengthening.  Progressed to stair training and squats for functional mobility when able.  Consider instability when able to tolerate.  Lorayne Bender PT DPT 07/22/23 9:24 AM [facility] [address] [place], [place], [address] Phone: [phone]   Fax:  [phone]

## 2023-07-22 ENCOUNTER — Encounter (HOSPITAL_BASED_OUTPATIENT_CLINIC_OR_DEPARTMENT_OTHER): Payer: Self-pay | Admitting: Physical Therapy

## 2023-07-23 ENCOUNTER — Encounter (HOSPITAL_BASED_OUTPATIENT_CLINIC_OR_DEPARTMENT_OTHER): Payer: Self-pay | Admitting: Physical Therapy

## 2023-07-23 ENCOUNTER — Ambulatory Visit (HOSPITAL_BASED_OUTPATIENT_CLINIC_OR_DEPARTMENT_OTHER): Payer: BC Managed Care – PPO | Admitting: Physical Therapy

## 2023-07-23 DIAGNOSIS — M25671 Stiffness of right ankle, not elsewhere classified: Secondary | ICD-10-CM

## 2023-07-23 DIAGNOSIS — M25572 Pain in left ankle and joints of left foot: Secondary | ICD-10-CM | POA: Diagnosis not present

## 2023-07-23 DIAGNOSIS — M25672 Stiffness of left ankle, not elsewhere classified: Secondary | ICD-10-CM

## 2023-07-23 DIAGNOSIS — M25571 Pain in right ankle and joints of right foot: Secondary | ICD-10-CM

## 2023-07-23 NOTE — Therapy (Signed)
OUTPATIENT PHYSICAL THERAPY LOWER EXTREMITY    Patient Name: Rhonda Espinoza MRN: [id number] DOB:[birth year], 62 y.o., female Today's Date: 07/23/2023  END OF SESSION:  PT End of Session - 07/23/23 1534     Visit Number 7    Number of Visits 21    Date for PT Re-Evaluation 08/28/23    PT Start Time 1530    PT Stop Time 1610    PT Time Calculation (min) 40 min    Behavior During Therapy [facility] for tasks assessed/performed               Past Medical History:  Diagnosis Date   ADD (attention deficit disorder)    Alcohol withdrawal seizure (HCC)    Alcohol-induced pancreatitis    Anxiety    Arthritis    Asthma    allergy induced per patient   Disc disorder    Hyperlipidemia    Hypertension    Pneumonia    Past Surgical History:  Procedure Laterality Date   CESAREAN SECTION     CHOLECYSTECTOMY  2022   FOOT SURGERY Left    FRACTURE SURGERY Right 07/2020   R wrist    HERNIA REPAIR     KNEE ARTHROSCOPY     KYPHOPLASTY N/A 10/13/2018   Procedure: THORACIC 12 KYPHOPLASTY;  Surgeon: Estill Bamberg, MD;  Location: [facility] OR;  Service: Orthopedics;  Laterality: N/A;   KYPHOPLASTY N/A 02/05/2021   Procedure: LUMBAR ONE  AND LUMBAR FOUR KYPHOPLASTY;  Surgeon: Estill Bamberg, MD;  Location: [facility] OR;  Service: Orthopedics;  Laterality: N/A;   Patient Active Problem List   Diagnosis Date Noted   Fatty liver, alcoholic 08/07/2022   Acute pancreatitis 08/06/2022   Acute alcoholic pancreatitis 05/29/2021   Seizures (HCC) 05/18/2021   Hypokalemia 05/18/2021   Acute encephalopathy 05/18/2021   Alcohol withdrawal seizure (HCC) 05/18/2021   ETOH abuse 05/18/2021   Surgery, elective 02/05/2021   History of T12 kyphoplasty 01/24/2019   Hip pain, acute, right 01/16/2019   Acute leg pain, right 01/16/2019   Sciatica of right side without back pain 01/16/2019   Latex precautions, history of latex allergy 11/02/2018   Lesion of right native kidney 08/30/2018   Abnormal MRI, lumbar spine  (08/14/2018) 08/23/2018   Lumbar lateral recess stenosis (Left: L2-3) (Bilateral: L3-4) (Right: L4-5) 08/23/2018   Lumbar Grade 1 Retrolisthesis of L2/L3, L3/L4, & L4/L5 08/23/2018   Grade 1 Lumbar Anterolisthesis of L5/S1 08/23/2018   Lumbar facet hypertrophy (Multilevel) (Bilateral) 08/19/2018   Lumbar foraminal stenosis (Left: L2-3, L3-4) (Bilateral: L4-5, L5-S1) 08/19/2018   Lumbar central spinal stenosis 08/19/2018   DDD (degenerative disc disease), lumbar 08/05/2018   Traumatic compression fracture of T12 (<15%) thoracic vertebra, sequela 08/05/2018   Abnormal x-ray of lumbar spine (08/02/2018) 08/05/2018   Disorder of skeletal system 08/05/2018   Pharmacologic therapy 08/05/2018   Problems influencing health status 08/05/2018   Chronic pain syndrome 08/05/2018   Class 1 obesity with body mass index (BMI) of 33.0 to 33.9 in adult 05/24/2018   Seasonal allergic rhinitis due to pollen 02/03/2017   Chronic low back pain (Bilateral) (R>L) 08/04/2016   Long term current use of opiate analgesic 08/04/2016   Long term prescription opiate use 08/04/2016   Opiate use 08/04/2016   Spondylosis without myelopathy or radiculopathy, lumbar region 08/04/2016   ADD (attention deficit disorder) 05/22/2016   GAD (generalized anxiety disorder) 05/22/2016   Hypertension, essential 05/22/2016   Panic attacks 05/22/2016   Essential hypertension 08/30/2015   Back muscle  spasm 01/31/2014   Abnormal LFTs 01/31/2014   Blood glucose elevated 01/31/2014   Insomnia 01/31/2014   Lumbar facet syndrome (Bilateral) (R>L) 01/31/2014   Elevated LFTs 01/31/2014   Avitaminosis D 07/03/2011   Vitamin D deficiency 07/03/2011   HLD (hyperlipidemia) 06/23/2011   Hypertriglyceridemia 06/23/2011    PCP: Dr. Antony Haste  REFERRING PROVIDER: Dr. Jamelle Haring Daws  REFERR Diagnosis  R52 (ICD-10-CM) - Pain, unspecified  T81.89XA (ICD-10-CM) - Other complications of procedures, not elsewhere classified, initial  encounter  S82.892A (ICD-10-CM) - Other fracture of left lower leg, initial encounter for closed fracture  ING DIAG:   THERAPY DIAG:  Pain in left ankle and joints of left foot  Stiffness of right ankle, not elsewhere classified  Stiffness of left ankle, not elsewhere classified  Pain in right ankle and joints of right foot  Rationale for Evaluation and Treatment: Rehabilitation  ONSET DATE: Intital surgerry January of 2024 on right ankle. Patient can not recall date and not in chart. 2nd surgery on left ankle 11/28/2022 ORIF  SUBJECTIVE:   SUBJECTIVE STATEMENT: Pt reports that she has been using a cane around house, but forgot it today. She reports that the manual work on her gave her relief, "It was really good". She has a membership to [employer] until the end of the month.  She plans to check out [employer] in [place], or may choose to renew membership.   POOL ACCESS: see above statement     Initial Subjective Th patient had a left ankle ORIF on 11/28/2022. The right ankle was fractured and she had to have a complete reconstruction some time in January of 2024. The patient can not remember exactly and it is not in the chart. She has been out of the boots for about a month. She also has a history of lumbar compression fx's. She has osteoporosis. She is walking without a device.  She also had a fall in which she suffered a fractured wrist  PERTINENT HISTORY: Osteoporosis; lumbar compression fx; broken wrist  PAIN:  Are you having pain? yes: NPRS scale: 3/10   Pain location: lower back  Pain description: ache Aggravating factors: *weight bearing;standing and walking Relieving factors: rest    PRECAUTIONS: Fall  RED FLAGS: None   WEIGHT BEARING RESTRICTIONS: No  FALLS:  Has patient fallen in last 6 months? Yes. Number of falls 3 2 falls resulted in broen andkles and 1 in a broken wrist   LIVING ENVIRONMENT: Small steps into the house   OCCUPATION:  Retired    Hobbies:  Hotel manager , walking   PLOF: Independent  PATIENT GOALS:   Get back to regular activity. Get back to swimming  NEXT MD VISIT:  1 month   OBJECTIVE:   DIAGNOSTIC FINDINGS:  Per patient: x-rays showing healing   PATIENT SURVEYS:  FOTO   unable to find in system 06/18/23: LEFS: 10/80  COGNITION: Overall cognitive status: Within functional limits for tasks assessed     SENSATION: Numbness in the left 3 middle toes  EDEMA:  Per visual inspection mild bilateral edema in ankles MUSCLE LENGTH:  POSTURE:   Rounded shoulders, forward head, significant left foot external rotation in sitting and standing.  PALPATION: No unexpected tenderness to palpation  LOWER EXTREMITY ROM:  Passive ROM Right eval Left eval R / L 06/18/23  Hip flexion     Hip extension     Hip abduction     Hip adduction     Hip internal rotation  Hip external rotation     Knee flexion     Knee extension     Ankle dorsiflexion 2 degrees Neutral 2d / 5d  Ankle plantarflexion Mild limitation at end range Mild limitation at end range  35d  Ankle inversion     Ankle eversion      (Blank rows = not tested)  LOWER EXTREMITY MMT:  MMT Right eval Left eval R / L 06/18/23  Hip flexion 22.1 20.2 34.6 / 40.3  Hip extension     Hip abduction 31.1 25.4 22.0 / 23.1  Hip adduction     Hip internal rotation     Hip external rotation     Knee flexion     Knee extension 26.8 24.4 48.4 / 39.9  Ankle dorsiflexion 4 4   Ankle plantarflexion Not test to Not tested   Ankle inversion Not tested Not tested   Ankle eversion Not tested Not tested    (Blank rows = not tested)   FUNCTIONAL TESTS:  5 times sit to stand test: Not tolerated 06/18/23  GAIT: Decreased bilateral lower extremity weightbearing.  Flexed posture.  Decreased bilateral hip flexion  06/18/23 off loading R&L wide range lateral sway  TODAY'S TREATMENT:                                                                                                                               10/24 Pt seen for aquatic therapy today.  Treatment took place in water 3.5-4.75 ft in depth at the [facility] pool. Temp of water was 91.  Pt entered and exited pool via stairs with step-to pattern.  * 4 ft without support: walking forward/ backward - multiple laps;  side stepping R/L -> with arm addct / abdct with rainbow hand floats (cues for upright posture);  *  farmer carry with single/ bilat rainbow hand float under water with walking backwards/ forward  * step forward/ return to neutral x 5 each LE (improved; no longer challenge) ue support rainbow hand floats *TrA set solid noodle pull down wide stance  x 10  * UE on wall:  3 way toe touch x 8-10 -> UE on noodle: 3 way LE kick x 5 each LE * forward step ups/ retro step downs x 5 each LE, BUE on rails * straddling yellow noodle: cycling forward/backward with UE resting on noodle: 4 widths; add/abd x 20; skiing x 20 * STS at bench in water with feet on blue step x 6 - with cues for hip hinge * breast stroke 2 lengths of therapy pool; elementary breast stroke 6 lengths   10/22 Gait training: Ambulated with single-point cane on left side.  Min cueing for proper gait technique.  Improve gait technique noted with practice.  100 feet performed.  Manual: Trigger point release to medial gastroc and medial ankle area.  Gentle dorsiflexion stretching.  Review sense of self lysed them to this area.  Trigger point  release to the medial plantar fasciitis insertion  Ankle inversion/eversion no resistance 2 x 10  Ankle plantarflexion 2 x 10 yellow band  Seated hip abduction red 2 x 15 Seated long arc quad 2 x 15 red Seated march 2 x 15 red  Initially started all exercises with yellow but RPE on each was 2-3.  Band adjusted to red RPE around 4-5 Updated HEP.       PATIENT EDUCATION:  Education details: aquatic therapy exercise modifications/progressions Person educated:  Patient Education method: Explanation, Demonstration, Tactile cues, Verbal cues, Education comprehension: verbalized understanding, returned demonstration, verbal cues required, tactile cues required, and needs further education  HOME EXERCISE PROGRAM:  [email or web address] URL: [email or web address] Date: 04/30/2023 Prepared by: Lorayne Bender  Exercises - Seated Calf Towel Stretch  - 2 x daily - 7 x weekly - 3 sets - 3 reps - 20sec  hold - Seated Heel Raise  - 2 x daily - 7 x weekly - 3 sets - 10 reps - Ankle Inversion Eversion Towel Slide  - 2 x daily - 7 x weekly - 3 sets - 10 reps  ASSESSMENT:  CLINICAL IMPRESSION: Patient tolerated treatment well, but back pain remained unchanged.  Forward /backward weight shifts with improved balance.  SLS exercises remain a good balance challenge.  Pt plans to look into which facility she'd like to continue exercising at.  Will plan to create HEP in upcoming visit for transition to community pool.  Progressing gradually towards all unmet goals.    RECERT- Pt presents today 7 weeks post initial evaluation due to a fall with broken ribs.  She is reassessed and certification period extended.  She is demonstrating an improvement in hip flex and knee extension  strength but slight decrease in hip abd.  She reports no ankle pain.  Left ankle ROM improved.  She ambs to session today without an assistive device.  She continues to have functional deficits due to poor balance and gait dysfunction. She is a high fall risk.  She did amb to and from setting today with difficulty.  Encouragement to bring an AD is given. She will benefit form skilled PT intervention with initial sessions being aquatic based for added safety in environment allowing improvement in strength and gait/balance before transitioning to land. She will require use of lift but will progress to using stairs.    OBJECTIVE IMPAIRMENTS: Abnormal gait, decreased activity tolerance, decreased  knowledge of use of DME, difficulty walking, decreased ROM, decreased strength, and pain.   ACTIVITY LIMITATIONS: carrying, lifting, bending, sitting, standing, squatting, stairs, transfers, and locomotion level  PARTICIPATION LIMITATIONS: meal prep, cleaning, laundry, driving, shopping, community activity, , and yard work  PERSONAL FACTORS: 3+ comorbidities: Osteoporosis, multilevel lumbar fracture 2020, 2022; frequent falls  are also affecting patient's functional outcome.   REHAB POTENTIAL: Good  CLINICAL DECISION MAKING: Evolving/moderate complexity declining general functional mobility  EVALUATION COMPLEXITY: Moderate   GOALS: Goals reviewed with patient? Yes  SHORT TERM GOALS: Target date: 07/17/23   Patient will increase gross bilateral lower extremity strength by 5 pounds. Baseline: Goal status: Met 06/18/23  2.  Patient will increase bilateral dorsiflexion motion by 5 degrees Baseline:  Goal status: In progress 06/18/23  3.  Patient will stand for greater than 15 minutes without increased pain Baseline:  Goal status: In progress 06/18/23  4.  Patient will be independent with basic exercise program Baseline:  Goal status: Initiated general strengthening exercises 10/23  5. Pt will tolerate full aquatic sessions consistently without  increase in pain and with improving function to demonstrate good toleration and effectiveness of intervention.   Baseline Goal status: Met 07/16/23    LONG TERM GOALS: Target date: 08/28/23    1. Patient will go up and down 8 steps with reciprocal gait pattern safely without increased pain Baseline:  Goal status: INITIAL  2.  Patient will ambulate community distances without increased pain Baseline:  Goal status: INITIAL  3.  Patient will have a complete land and pool program to promote strength and functional mobility Baseline:  Goal status: INITIAL  4. Pt will tolerate walking to and from setting and engaging in aquatic  therapy session without excessive fatigue or increase in pain to demonstrate improved toleration to activity.  Baseline: unable Goal Status: In progress 07/16/23  PLAN:  PT FREQUENCY: 2x/week  PT DURATION: 8 weeks  PLANNED INTERVENTIONS: Therapeutic exercises, Therapeutic activity, Neuromuscular re-education, Balance training, Gait training, Patient/Family education, Self Care, Joint mobilization, Stair training, DME instructions, Aquatic Therapy, Dry Needling, Electrical stimulation, Cryotherapy, Moist heat, Taping, Manual therapy, and Re-evaluation.   PLAN FOR NEXT SESSION:   Aquatics: Gait training; stairs.  Work on bilateral core and hip strengthening.  Work on ankle range of motion.  Work on weightbearing activity.  Issue HEP.  Land: Manual therapy to improve bilateral dorsiflexion.  Focused ankle stability strengthening.  Progressed to stair training and squats for functional mobility when able.  Consider instability when able to tolerate.  Mayer Camel, PTA 07/23/23 4:14 PM [facility] [address] [place], [place], [address] Phone: [phone]   Fax:  [phone]

## 2023-07-28 ENCOUNTER — Ambulatory Visit (HOSPITAL_BASED_OUTPATIENT_CLINIC_OR_DEPARTMENT_OTHER)
Admission: RE | Admit: 2023-07-28 | Payer: BC Managed Care – PPO | Source: Home / Self Care | Admitting: Obstetrics and Gynecology

## 2023-07-28 ENCOUNTER — Ambulatory Visit (HOSPITAL_BASED_OUTPATIENT_CLINIC_OR_DEPARTMENT_OTHER): Payer: BC Managed Care – PPO | Admitting: Physical Therapy

## 2023-07-28 DIAGNOSIS — M25572 Pain in left ankle and joints of left foot: Secondary | ICD-10-CM | POA: Diagnosis not present

## 2023-07-28 DIAGNOSIS — M25672 Stiffness of left ankle, not elsewhere classified: Secondary | ICD-10-CM

## 2023-07-28 DIAGNOSIS — R2689 Other abnormalities of gait and mobility: Secondary | ICD-10-CM

## 2023-07-28 DIAGNOSIS — M25571 Pain in right ankle and joints of right foot: Secondary | ICD-10-CM

## 2023-07-28 DIAGNOSIS — M25671 Stiffness of right ankle, not elsewhere classified: Secondary | ICD-10-CM

## 2023-07-28 SURGERY — DILATATION & CURETTAGE/HYSTEROSCOPY WITH MYOSURE
Anesthesia: Choice

## 2023-07-28 NOTE — Therapy (Signed)
OUTPATIENT PHYSICAL THERAPY LOWER EXTREMITY    Patient Name: Rhonda Espinoza MRN: [id number] DOB:1960/10/23, 62 y.o., female Today's Date: 07/29/2023  END OF SESSION:  PT End of Session - 07/29/23 1004     Visit Number 8    Number of Visits 21    Date for PT Re-Evaluation 08/28/23    PT Start Time 0400    PT Stop Time 0443    PT Time Calculation (min) 43 min    Activity Tolerance Patient tolerated treatment well    Behavior During Therapy WFL for tasks assessed/performed                Past Medical History:  Diagnosis Date   ADD (attention deficit disorder)    Alcohol withdrawal seizure (HCC)    Alcohol-induced pancreatitis    Anxiety    Arthritis    Asthma    allergy induced per patient   Disc disorder    Hyperlipidemia    Hypertension    Pneumonia    Past Surgical History:  Procedure Laterality Date   CESAREAN SECTION     CHOLECYSTECTOMY  2022   FOOT SURGERY Left    FRACTURE SURGERY Right 07/2020   R wrist    HERNIA REPAIR     KNEE ARTHROSCOPY     KYPHOPLASTY N/A 10/13/2018   Procedure: THORACIC 12 KYPHOPLASTY;  Surgeon: Estill Bamberg, MD;  Location: [facility] OR;  Service: Orthopedics;  Laterality: N/A;   KYPHOPLASTY N/A 02/05/2021   Procedure: LUMBAR ONE  AND LUMBAR FOUR KYPHOPLASTY;  Surgeon: Estill Bamberg, MD;  Location: [facility] OR;  Service: Orthopedics;  Laterality: N/A;   Patient Active Problem List   Diagnosis Date Noted   Fatty liver, alcoholic 08/07/2022   Acute pancreatitis 08/06/2022   Acute alcoholic pancreatitis 05/29/2021   Seizures (HCC) 05/18/2021   Hypokalemia 05/18/2021   Acute encephalopathy 05/18/2021   Alcohol withdrawal seizure (HCC) 05/18/2021   ETOH abuse 05/18/2021   Surgery, elective 02/05/2021   History of T12 kyphoplasty 01/24/2019   Hip pain, acute, right 01/16/2019   Acute leg pain, right 01/16/2019   Sciatica of right side without back pain 01/16/2019   Latex precautions, history of latex allergy 11/02/2018   Lesion of  right native kidney 08/30/2018   Abnormal MRI, lumbar spine (08/14/2018) 08/23/2018   Lumbar lateral recess stenosis (Left: L2-3) (Bilateral: L3-4) (Right: L4-5) 08/23/2018   Lumbar Grade 1 Retrolisthesis of L2/L3, L3/L4, & L4/L5 08/23/2018   Grade 1 Lumbar Anterolisthesis of L5/S1 08/23/2018   Lumbar facet hypertrophy (Multilevel) (Bilateral) 08/19/2018   Lumbar foraminal stenosis (Left: L2-3, L3-4) (Bilateral: L4-5, L5-S1) 08/19/2018   Lumbar central spinal stenosis 08/19/2018   DDD (degenerative disc disease), lumbar 08/05/2018   Traumatic compression fracture of T12 (<15%) thoracic vertebra, sequela 08/05/2018   Abnormal x-ray of lumbar spine (08/02/2018) 08/05/2018   Disorder of skeletal system 08/05/2018   Pharmacologic therapy 08/05/2018   Problems influencing health status 08/05/2018   Chronic pain syndrome 08/05/2018   Class 1 obesity with body mass index (BMI) of 33.0 to 33.9 in adult 05/24/2018   Seasonal allergic rhinitis due to pollen 02/03/2017   Chronic low back pain (Bilateral) (R>L) 08/04/2016   Long term current use of opiate analgesic 08/04/2016   Long term prescription opiate use 08/04/2016   Opiate use 08/04/2016   Spondylosis without myelopathy or radiculopathy, lumbar region 08/04/2016   ADD (attention deficit disorder) 05/22/2016   GAD (generalized anxiety disorder) 05/22/2016   Hypertension, essential 05/22/2016   Panic attacks  05/22/2016   Essential hypertension 08/30/2015   Back muscle spasm 01/31/2014   Abnormal LFTs 01/31/2014   Blood glucose elevated 01/31/2014   Insomnia 01/31/2014   Lumbar facet syndrome (Bilateral) (R>L) 01/31/2014   Elevated LFTs 01/31/2014   Avitaminosis D 07/03/2011   Vitamin D deficiency 07/03/2011   HLD (hyperlipidemia) 06/23/2011   Hypertriglyceridemia 06/23/2011    PCP: Dr. Antony Haste  REFERRING PROVIDER: Dr. Jamelle Haring Daws  REFERR Diagnosis  R52 (ICD-10-CM) - Pain, unspecified  T81.89XA (ICD-10-CM) - Other  complications of procedures, not elsewhere classified, initial encounter  S82.892A (ICD-10-CM) - Other fracture of left lower leg, initial encounter for closed fracture  ING DIAG:   THERAPY DIAG:  No diagnosis found.  Rationale for Evaluation and Treatment: Rehabilitation  ONSET DATE: Intital surgerry January of 2024 on right ankle. Patient can not recall date and not in chart. 2nd surgery on left ankle 11/28/2022 ORIF  SUBJECTIVE:   SUBJECTIVE STATEMENT: Pt reports her MD changed her BP medication recently and she has noticed she's been dizzy more often. Pt states she still has difficulty with walking and navigating stairs.     POOL ACCESS: see above statement     Initial Subjective Th patient had a left ankle ORIF on 11/28/2022. The right ankle was fractured and she had to have a complete reconstruction some time in January of 2024. The patient can not remember exactly and it is not in the chart. She has been out of the boots for about a month. She also has a history of lumbar compression fx's. She has osteoporosis. She is walking without a device.  She also had a fall in which she suffered a fractured wrist  PERTINENT HISTORY: Osteoporosis; lumbar compression fx; broken wrist  PAIN:  Are you having pain? yes: NPRS scale: 3/10   Pain location: lower back  Pain description: ache Aggravating factors: *weight bearing;standing and walking Relieving factors: rest    PRECAUTIONS: Fall  RED FLAGS: None   WEIGHT BEARING RESTRICTIONS: No  FALLS:  Has patient fallen in last 6 months? Yes. Number of falls 3 2 falls resulted in broen andkles and 1 in a broken wrist   LIVING ENVIRONMENT: Small steps into the house   OCCUPATION:  Retired   Hobbies:  Hotel manager , walking   PLOF: Independent  PATIENT GOALS:   Get back to regular activity. Get back to swimming  NEXT MD VISIT:  1 month   OBJECTIVE:   Vitals signs:   BP: 100/60 DIAGNOSTIC FINDINGS:  Per patient: x-rays  showing healing   PATIENT SURVEYS:  FOTO   unable to find in system 06/18/23: LEFS: 10/80  COGNITION: Overall cognitive status: Within functional limits for tasks assessed     SENSATION: Numbness in the left 3 middle toes  EDEMA:  Per visual inspection mild bilateral edema in ankles MUSCLE LENGTH:  POSTURE:   Rounded shoulders, forward head, significant left foot external rotation in sitting and standing.  PALPATION: No unexpected tenderness to palpation  LOWER EXTREMITY ROM:  Passive ROM Right eval Left eval R / L 06/18/23  Hip flexion     Hip extension     Hip abduction     Hip adduction     Hip internal rotation     Hip external rotation     Knee flexion     Knee extension     Ankle dorsiflexion 2 degrees Neutral 2d / 5d  Ankle plantarflexion Mild limitation at end range Mild limitation at end range  35d  Ankle inversion     Ankle eversion      (Blank rows = not tested)  LOWER EXTREMITY MMT:  MMT Right eval Left eval R / L 06/18/23  Hip flexion 22.1 20.2 34.6 / 40.3  Hip extension     Hip abduction 31.1 25.4 22.0 / 23.1  Hip adduction     Hip internal rotation     Hip external rotation     Knee flexion     Knee extension 26.8 24.4 48.4 / 39.9  Ankle dorsiflexion 4 4   Ankle plantarflexion Not test to Not tested   Ankle inversion Not tested Not tested   Ankle eversion Not tested Not tested    (Blank rows = not tested)   FUNCTIONAL TESTS:  5 times sit to stand test: Not tolerated 06/18/23  GAIT: Decreased bilateral lower extremity weightbearing.  Flexed posture.  Decreased bilateral hip flexion  06/18/23 off loading R&L wide range lateral sway  TODAY'S TREATMENT:                                                                                                                              07/29/2023 Ankle plantarflexion & dorsiflexion 2 x 10 yellow band LTR x15 Piriformis stretch 2x20 sec hold  Seated chair stretch 3x20 sec hold fwd and lateral   Reviewed of tennis ball trigger point release   Seated heel/toe raise x20  Seated windshield wiper x20   Manual: Trigger point release to medial gastroc and medial ankle area.  Gentle dorsiflexion stretching.  Review sense of self lysed them to this area.  Trigger point release to the medial plantar fasciitis insertion    10/24 Pt seen for aquatic therapy today.  Treatment took place in water 3.5-4.75 ft in depth at the [facility] pool. Temp of water was 91.  Pt entered and exited pool via stairs with step-to pattern.  * 4 ft without support: walking forward/ backward - multiple laps;  side stepping R/L -> with arm addct / abdct with rainbow hand floats (cues for upright posture);  *  farmer carry with single/ bilat rainbow hand float under water with walking backwards/ forward  * step forward/ return to neutral x 5 each LE (improved; no longer challenge) ue support rainbow hand floats *TrA set solid noodle pull down wide stance  x 10  * UE on wall:  3 way toe touch x 8-10 -> UE on noodle: 3 way LE kick x 5 each LE * forward step ups/ retro step downs x 5 each LE, BUE on rails * straddling yellow noodle: cycling forward/backward with UE resting on noodle: 4 widths; add/abd x 20; skiing x 20 * STS at bench in water with feet on blue step x 6 - with cues for hip hinge * breast stroke 2 lengths of therapy pool; elementary breast stroke 6 lengths   10/22 Gait training: Ambulated with single-point cane on left side.  Min  cueing for proper gait technique.  Improve gait technique noted with practice.  100 feet performed.  Manual: Trigger point release to medial gastroc and medial ankle area.  Gentle dorsiflexion stretching.  Review sense of self lysed them to this area.  Trigger point release to the medial plantar fasciitis insertion  Ankle inversion/eversion no resistance 2 x 10  Ankle plantarflexion 2 x 10 yellow band  Seated hip abduction red 2 x 15 Seated long arc quad 2 x  15 red Seated march 2 x 15 red  Initially started all exercises with yellow but RPE on each was 2-3.  Band adjusted to red RPE around 4-5 Updated HEP.       PATIENT EDUCATION:  Education details: aquatic therapy exercise modifications/progressions Person educated: Patient Education method: Explanation, Demonstration, Tactile cues, Verbal cues, Education comprehension: verbalized understanding, returned demonstration, verbal cues required, tactile cues required, and needs further education  HOME EXERCISE PROGRAM:  [email or web address] URL: [email or web address] Date: 04/30/2023 Prepared by: Lorayne Bender  Exercises - Seated Calf Towel Stretch  - 2 x daily - 7 x weekly - 3 sets - 3 reps - 20sec  hold - Seated Heel Raise  - 2 x daily - 7 x weekly - 3 sets - 10 reps - Ankle Inversion Eversion Towel Slide  - 2 x daily - 7 x weekly - 3 sets - 10 reps  ASSESSMENT:  CLINICAL IMPRESSION: Therapy focused more on seated and supine ankle activity today 2nd to low B/P and mild syncope. We still were able to work on ankle ROM and trigger points. She also has had back pain that is effecting her ability to ambulate. We reviewed base stretches with her she can use at home. We will progress back to weight bearing and standing exercises if patient's B/P stabilizes.   RECERT- Pt presents today 7 weeks post initial evaluation due to a fall with broken ribs.  She is reassessed and certification period extended.  She is demonstrating an improvement in hip flex and knee extension  strength but slight decrease in hip abd.  She reports no ankle pain.  Left ankle ROM improved.  She ambs to session today without an assistive device.  She continues to have functional deficits due to poor balance and gait dysfunction. She is a high fall risk.  She did amb to and from setting today with difficulty.  Encouragement to bring an AD is given. She will benefit form skilled PT intervention with initial sessions being  aquatic based for added safety in environment allowing improvement in strength and gait/balance before transitioning to land. She will require use of lift but will progress to using stairs.    OBJECTIVE IMPAIRMENTS: Abnormal gait, decreased activity tolerance, decreased knowledge of use of DME, difficulty walking, decreased ROM, decreased strength, and pain.   ACTIVITY LIMITATIONS: carrying, lifting, bending, sitting, standing, squatting, stairs, transfers, and locomotion level  PARTICIPATION LIMITATIONS: meal prep, cleaning, laundry, driving, shopping, community activity, , and yard work  PERSONAL FACTORS: 3+ comorbidities: Osteoporosis, multilevel lumbar fracture 2020, 2022; frequent falls  are also affecting patient's functional outcome.   REHAB POTENTIAL: Good  CLINICAL DECISION MAKING: Evolving/moderate complexity declining general functional mobility  EVALUATION COMPLEXITY: Moderate   GOALS: Goals reviewed with patient? Yes  SHORT TERM GOALS: Target date: 07/17/23   Patient will increase gross bilateral lower extremity strength by 5 pounds. Baseline: Goal status: Met 06/18/23  2.  Patient will increase bilateral dorsiflexion motion by 5 degrees Baseline:  Goal status: In progress 06/18/23  3.  Patient will stand for greater than 15 minutes without increased pain Baseline:  Goal status: In progress 06/18/23  4.  Patient will be independent with basic exercise program Baseline:  Goal status: Initiated general strengthening exercises 10/23  5. Pt will tolerate full aquatic sessions consistently without increase in pain and with improving function to demonstrate good toleration and effectiveness of intervention.   Baseline Goal status: Met 07/16/23    LONG TERM GOALS: Target date: 08/28/23    1. Patient will go up and down 8 steps with reciprocal gait pattern safely without increased pain Baseline:  Goal status: INITIAL  2.  Patient will ambulate community  distances without increased pain Baseline:  Goal status: INITIAL  3.  Patient will have a complete land and pool program to promote strength and functional mobility Baseline:  Goal status: INITIAL  4. Pt will tolerate walking to and from setting and engaging in aquatic therapy session without excessive fatigue or increase in pain to demonstrate improved toleration to activity.  Baseline: unable Goal Status: In progress 07/16/23  PLAN:  PT FREQUENCY: 2x/week  PT DURATION: 8 weeks  PLANNED INTERVENTIONS: Therapeutic exercises, Therapeutic activity, Neuromuscular re-education, Balance training, Gait training, Patient/Family education, Self Care, Joint mobilization, Stair training, DME instructions, Aquatic Therapy, Dry Needling, Electrical stimulation, Cryotherapy, Moist heat, Taping, Manual therapy, and Re-evaluation.   PLAN FOR NEXT SESSION:   Aquatics: Gait training; stairs.  Work on bilateral core and hip strengthening.  Work on ankle range of motion.  Work on weightbearing activity.  Issue HEP.  Land: Manual therapy to improve bilateral dorsiflexion.  Focused ankle stability strengthening.  Progressed to stair training and squats for functional mobility when able.  Consider instability when able to tolerate.  Mayer Camel, PTA 07/29/23 10:15 AM [facility] [address] [place], [place], [address] Phone: [phone]   Fax:  [phone]

## 2023-07-29 ENCOUNTER — Encounter (HOSPITAL_BASED_OUTPATIENT_CLINIC_OR_DEPARTMENT_OTHER): Payer: Self-pay | Admitting: Physical Therapy

## 2023-07-30 ENCOUNTER — Ambulatory Visit (HOSPITAL_BASED_OUTPATIENT_CLINIC_OR_DEPARTMENT_OTHER): Payer: BC Managed Care – PPO | Admitting: Physical Therapy

## 2023-08-04 ENCOUNTER — Ambulatory Visit (HOSPITAL_BASED_OUTPATIENT_CLINIC_OR_DEPARTMENT_OTHER): Payer: BC Managed Care – PPO | Admitting: Physical Therapy

## 2023-08-04 ENCOUNTER — Encounter (HOSPITAL_BASED_OUTPATIENT_CLINIC_OR_DEPARTMENT_OTHER): Payer: Self-pay

## 2023-08-06 ENCOUNTER — Ambulatory Visit (HOSPITAL_BASED_OUTPATIENT_CLINIC_OR_DEPARTMENT_OTHER): Payer: BC Managed Care – PPO | Admitting: Physical Therapy

## 2023-08-11 ENCOUNTER — Ambulatory Visit (HOSPITAL_BASED_OUTPATIENT_CLINIC_OR_DEPARTMENT_OTHER): Payer: BC Managed Care – PPO | Attending: Orthopedic Surgery | Admitting: Physical Therapy

## 2023-08-11 ENCOUNTER — Encounter (HOSPITAL_BASED_OUTPATIENT_CLINIC_OR_DEPARTMENT_OTHER): Payer: Self-pay | Admitting: Physical Therapy

## 2023-08-11 DIAGNOSIS — M25672 Stiffness of left ankle, not elsewhere classified: Secondary | ICD-10-CM | POA: Diagnosis present

## 2023-08-11 DIAGNOSIS — M25671 Stiffness of right ankle, not elsewhere classified: Secondary | ICD-10-CM | POA: Insufficient documentation

## 2023-08-11 DIAGNOSIS — M25572 Pain in left ankle and joints of left foot: Secondary | ICD-10-CM | POA: Diagnosis present

## 2023-08-11 DIAGNOSIS — M25571 Pain in right ankle and joints of right foot: Secondary | ICD-10-CM | POA: Insufficient documentation

## 2023-08-11 DIAGNOSIS — R2689 Other abnormalities of gait and mobility: Secondary | ICD-10-CM | POA: Diagnosis present

## 2023-08-11 NOTE — Therapy (Signed)
OUTPATIENT PHYSICAL THERAPY LOWER EXTREMITY    Patient Name: Rhonda Espinoza MRN: [id number] DOB:12-Dec-1960, 62 y.o., female Today's Date: 08/11/2023  END OF SESSION:  PT End of Session - 08/11/23 1715     Visit Number 9    Number of Visits 21    Date for PT Re-Evaluation 08/28/23    PT Start Time 1600    PT Stop Time 1642    PT Time Calculation (min) 42 min    Activity Tolerance Patient tolerated treatment well    Behavior During Therapy WFL for tasks assessed/performed                Past Medical History:  Diagnosis Date   ADD (attention deficit disorder)    Alcohol withdrawal seizure (HCC)    Alcohol-induced pancreatitis    Anxiety    Arthritis    Asthma    allergy induced per patient   Disc disorder    Hyperlipidemia    Hypertension    Pneumonia    Past Surgical History:  Procedure Laterality Date   CESAREAN SECTION     CHOLECYSTECTOMY  2022   FOOT SURGERY Left    FRACTURE SURGERY Right 07/2020   R wrist    HERNIA REPAIR     KNEE ARTHROSCOPY     KYPHOPLASTY N/A 10/13/2018   Procedure: THORACIC 12 KYPHOPLASTY;  Surgeon: Estill Bamberg, MD;  Location: [facility] OR;  Service: Orthopedics;  Laterality: N/A;   KYPHOPLASTY N/A 02/05/2021   Procedure: LUMBAR ONE  AND LUMBAR FOUR KYPHOPLASTY;  Surgeon: Estill Bamberg, MD;  Location: [facility] OR;  Service: Orthopedics;  Laterality: N/A;   Patient Active Problem List   Diagnosis Date Noted   Fatty liver, alcoholic 08/07/2022   Acute pancreatitis 08/06/2022   Acute alcoholic pancreatitis 05/29/2021   Seizures (HCC) 05/18/2021   Hypokalemia 05/18/2021   Acute encephalopathy 05/18/2021   Alcohol withdrawal seizure (HCC) 05/18/2021   ETOH abuse 05/18/2021   Surgery, elective 02/05/2021   History of T12 kyphoplasty 01/24/2019   Hip pain, acute, right 01/16/2019   Acute leg pain, right 01/16/2019   Sciatica of right side without back pain 01/16/2019   Latex precautions, history of latex allergy 11/02/2018   Lesion of  right native kidney 08/30/2018   Abnormal MRI, lumbar spine (08/14/2018) 08/23/2018   Lumbar lateral recess stenosis (Left: L2-3) (Bilateral: L3-4) (Right: L4-5) 08/23/2018   Lumbar Grade 1 Retrolisthesis of L2/L3, L3/L4, & L4/L5 08/23/2018   Grade 1 Lumbar Anterolisthesis of L5/S1 08/23/2018   Lumbar facet hypertrophy (Multilevel) (Bilateral) 08/19/2018   Lumbar foraminal stenosis (Left: L2-3, L3-4) (Bilateral: L4-5, L5-S1) 08/19/2018   Lumbar central spinal stenosis 08/19/2018   DDD (degenerative disc disease), lumbar 08/05/2018   Traumatic compression fracture of T12 (<15%) thoracic vertebra, sequela 08/05/2018   Abnormal x-ray of lumbar spine (08/02/2018) 08/05/2018   Disorder of skeletal system 08/05/2018   Pharmacologic therapy 08/05/2018   Problems influencing health status 08/05/2018   Chronic pain syndrome 08/05/2018   Class 1 obesity with body mass index (BMI) of 33.0 to 33.9 in adult 05/24/2018   Seasonal allergic rhinitis due to pollen 02/03/2017   Chronic low back pain (Bilateral) (R>L) 08/04/2016   Long term current use of opiate analgesic 08/04/2016   Long term prescription opiate use 08/04/2016   Opiate use 08/04/2016   Spondylosis without myelopathy or radiculopathy, lumbar region 08/04/2016   ADD (attention deficit disorder) 05/22/2016   GAD (generalized anxiety disorder) 05/22/2016   Hypertension, essential 05/22/2016   Panic attacks  05/22/2016   Essential hypertension 08/30/2015   Back muscle spasm 01/31/2014   Abnormal LFTs 01/31/2014   Blood glucose elevated 01/31/2014   Insomnia 01/31/2014   Lumbar facet syndrome (Bilateral) (R>L) 01/31/2014   Elevated LFTs 01/31/2014   Avitaminosis D 07/03/2011   Vitamin D deficiency 07/03/2011   HLD (hyperlipidemia) 06/23/2011   Hypertriglyceridemia 06/23/2011    PCP: Dr. Antony Haste  REFERRING PROVIDER: Dr. Jamelle Haring Daws  REFERR Diagnosis  R52 (ICD-10-CM) - Pain, unspecified  T81.89XA (ICD-10-CM) - Other  complications of procedures, not elsewhere classified, initial encounter  S82.892A (ICD-10-CM) - Other fracture of left lower leg, initial encounter for closed fracture  ING DIAG:   THERAPY DIAG:  Pain in left ankle and joints of left foot  Stiffness of right ankle, not elsewhere classified  Stiffness of left ankle, not elsewhere classified  Pain in right ankle and joints of right foot  Other abnormalities of gait and mobility  Rationale for Evaluation and Treatment: Rehabilitation  ONSET DATE: Intital surgerry January of 2024 on right ankle. Patient can not recall date and not in chart. 2nd surgery on left ankle 11/28/2022 ORIF  SUBJECTIVE:   SUBJECTIVE STATEMENT: Pt reports her MD changed her BP medication recently and she has noticed she's been dizzy more often. Pt states she still has difficulty with walking and navigating stairs.     POOL ACCESS: see above statement     Initial Subjective Th patient had a left ankle ORIF on 11/28/2022. The right ankle was fractured and she had to have a complete reconstruction some time in January of 2024. The patient can not remember exactly and it is not in the chart. She has been out of the boots for about a month. She also has a history of lumbar compression fx's. She has osteoporosis. She is walking without a device.  She also had a fall in which she suffered a fractured wrist  PERTINENT HISTORY: Osteoporosis; lumbar compression fx; broken wrist  PAIN:  Are you having pain? yes: NPRS scale: 3/10   Pain location: lower back  Pain description: ache Aggravating factors: *weight bearing;standing and walking Relieving factors: rest    PRECAUTIONS: Fall  RED FLAGS: None   WEIGHT BEARING RESTRICTIONS: No  FALLS:  Has patient fallen in last 6 months? Yes. Number of falls 3 2 falls resulted in broen andkles and 1 in a broken wrist   LIVING ENVIRONMENT: Small steps into the house   OCCUPATION:  Retired   Hobbies:  Hotel manager ,  walking   PLOF: Independent  PATIENT GOALS:   Get back to regular activity. Get back to swimming  NEXT MD VISIT:  1 month   OBJECTIVE:   Vitals signs:   BP: 100/60 DIAGNOSTIC FINDINGS:  Per patient: x-rays showing healing   PATIENT SURVEYS:  FOTO   unable to find in system 06/18/23: LEFS: 10/80  COGNITION: Overall cognitive status: Within functional limits for tasks assessed     SENSATION: Numbness in the left 3 middle toes  EDEMA:  Per visual inspection mild bilateral edema in ankles MUSCLE LENGTH:  POSTURE:   Rounded shoulders, forward head, significant left foot external rotation in sitting and standing.  PALPATION: No unexpected tenderness to palpation  LOWER EXTREMITY ROM:  Passive ROM Right eval Left eval R / L 06/18/23  Hip flexion     Hip extension     Hip abduction     Hip adduction     Hip internal rotation     Hip external  rotation     Knee flexion     Knee extension     Ankle dorsiflexion 2 degrees Neutral 2d / 5d  Ankle plantarflexion Mild limitation at end range Mild limitation at end range  35d  Ankle inversion     Ankle eversion      (Blank rows = not tested)  LOWER EXTREMITY MMT:  MMT Right eval Left eval R / L 06/18/23  Hip flexion 22.1 20.2 34.6 / 40.3  Hip extension     Hip abduction 31.1 25.4 22.0 / 23.1  Hip adduction     Hip internal rotation     Hip external rotation     Knee flexion     Knee extension 26.8 24.4 48.4 / 39.9  Ankle dorsiflexion 4 4   Ankle plantarflexion Not test to Not tested   Ankle inversion Not tested Not tested   Ankle eversion Not tested Not tested    (Blank rows = not tested)   FUNCTIONAL TESTS:  5 times sit to stand test: Not tolerated 06/18/23  GAIT: Decreased bilateral lower extremity weightbearing.  Flexed posture.  Decreased bilateral hip flexion  06/18/23 off loading R&L wide range lateral sway  TODAY'S TREATMENT:                                                                                                                               08/11/2023 Ankle plantarflexion & dorsiflexion 2 x 10 yellow band LTR x15 Piriformis stretch 2x20 sec hold  Seated chair stretch 3x20 sec hold fwd and lateral  Reviewed of tennis ball trigger point release    Manual: Trigger point release to medial gastroc and medial ankle area.  Gentle dorsiflexion stretching.  Review sense of self lysed them to this area.  Trigger point release to the medial plantar fasciitis insertion    10/24 Pt seen for aquatic therapy today.  Treatment took place in water 3.5-4.75 ft in depth at the [facility] pool. Temp of water was 91.  Pt entered and exited pool via stairs with step-to pattern.  * 4 ft without support: walking forward/ backward - multiple laps;  side stepping R/L -> with arm addct / abdct with rainbow hand floats (cues for upright posture);  *  farmer carry with single/ bilat rainbow hand float under water with walking backwards/ forward  * step forward/ return to neutral x 5 each LE (improved; no longer challenge) ue support rainbow hand floats *TrA set solid noodle pull down wide stance  x 10  * UE on wall:  3 way toe touch x 8-10 -> UE on noodle: 3 way LE kick x 5 each LE * forward step ups/ retro step downs x 5 each LE, BUE on rails * straddling yellow noodle: cycling forward/backward with UE resting on noodle: 4 widths; add/abd x 20; skiing x 20 * STS at bench in water with feet on blue step x 6 - with cues for  hip hinge * breast stroke 2 lengths of therapy pool; elementary breast stroke 6 lengths   10/22 Gait training: Ambulated with single-point cane on left side.  Min cueing for proper gait technique.  Improve gait technique noted with practice.  100 feet performed.  Manual: Trigger point release to medial gastroc and medial ankle area.  Gentle dorsiflexion stretching.  Review sense of self lysed them to this area.  Trigger point release to the medial plantar fasciitis  insertion  Ankle inversion/eversion no resistance 2 x 10  Ankle plantarflexion 2 x 10 yellow band  Seated hip abduction red 2 x 15 Seated long arc quad 2 x 15 red Seated march 2 x 15 red  Initially started all exercises with yellow but RPE on each was 2-3.  Band adjusted to red RPE around 4-5 Updated HEP.       PATIENT EDUCATION:  Education details: aquatic therapy exercise modifications/progressions Person educated: Patient Education method: Explanation, Demonstration, Tactile cues, Verbal cues, Education comprehension: verbalized understanding, returned demonstration, verbal cues required, tactile cues required, and needs further education  HOME EXERCISE PROGRAM:  [email or web address] URL: [email or web address] Date: 04/30/2023 Prepared by: Lorayne Bender  Exercises - Seated Calf Towel Stretch  - 2 x daily - 7 x weekly - 3 sets - 3 reps - 20sec  hold - Seated Heel Raise  - 2 x daily - 7 x weekly - 3 sets - 10 reps - Ankle Inversion Eversion Towel Slide  - 2 x daily - 7 x weekly - 3 sets - 10 reps  ASSESSMENT:  CLINICAL IMPRESSION: Therapy focused more on seated and supine ankle activity today 2nd to low B/P and mild syncope. We still were able to work on ankle ROM and trigger points. She also has had back pain that is effecting her ability to ambulate. We reviewed base stretches with her she can use at home. We will progress back to weight bearing and standing exercises if patient's B/P stabilizes.   RECERT- Pt presents today 7 weeks post initial evaluation due to a fall with broken ribs.  She is reassessed and certification period extended.  She is demonstrating an improvement in hip flex and knee extension  strength but slight decrease in hip abd.  She reports no ankle pain.  Left ankle ROM improved.  She ambs to session today without an assistive device.  She continues to have functional deficits due to poor balance and gait dysfunction. She is a high fall risk.  She  did amb to and from setting today with difficulty.  Encouragement to bring an AD is given. She will benefit form skilled PT intervention with initial sessions being aquatic based for added safety in environment allowing improvement in strength and gait/balance before transitioning to land. She will require use of lift but will progress to using stairs.    OBJECTIVE IMPAIRMENTS: Abnormal gait, decreased activity tolerance, decreased knowledge of use of DME, difficulty walking, decreased ROM, decreased strength, and pain.   ACTIVITY LIMITATIONS: carrying, lifting, bending, sitting, standing, squatting, stairs, transfers, and locomotion level  PARTICIPATION LIMITATIONS: meal prep, cleaning, laundry, driving, shopping, community activity, , and yard work  PERSONAL FACTORS: 3+ comorbidities: Osteoporosis, multilevel lumbar fracture 2020, 2022; frequent falls  are also affecting patient's functional outcome.   REHAB POTENTIAL: Good  CLINICAL DECISION MAKING: Evolving/moderate complexity declining general functional mobility  EVALUATION COMPLEXITY: Moderate   GOALS: Goals reviewed with patient? Yes  SHORT TERM GOALS: Target date: 07/17/23   Patient will increase gross bilateral lower  extremity strength by 5 pounds. Baseline: Goal status: Met 06/18/23  2.  Patient will increase bilateral dorsiflexion motion by 5 degrees Baseline:  Goal status: In progress 06/18/23  3.  Patient will stand for greater than 15 minutes without increased pain Baseline:  Goal status: In progress 06/18/23  4.  Patient will be independent with basic exercise program Baseline:  Goal status: Initiated general strengthening exercises 10/23  5. Pt will tolerate full aquatic sessions consistently without increase in pain and with improving function to demonstrate good toleration and effectiveness of intervention.   Baseline Goal status: Met 07/16/23    LONG TERM GOALS: Target date: 08/28/23    1. Patient  will go up and down 8 steps with reciprocal gait pattern safely without increased pain Baseline:  Goal status: INITIAL  2.  Patient will ambulate community distances without increased pain Baseline:  Goal status: INITIAL  3.  Patient will have a complete land and pool program to promote strength and functional mobility Baseline:  Goal status: INITIAL  4. Pt will tolerate walking to and from setting and engaging in aquatic therapy session without excessive fatigue or increase in pain to demonstrate improved toleration to activity.  Baseline: unable Goal Status: In progress 07/16/23  PLAN:  PT FREQUENCY: 2x/week  PT DURATION: 8 weeks  PLANNED INTERVENTIONS: Therapeutic exercises, Therapeutic activity, Neuromuscular re-education, Balance training, Gait training, Patient/Family education, Self Care, Joint mobilization, Stair training, DME instructions, Aquatic Therapy, Dry Needling, Electrical stimulation, Cryotherapy, Moist heat, Taping, Manual therapy, and Re-evaluation.   PLAN FOR NEXT SESSION:   Aquatics: Gait training; stairs.  Work on bilateral core and hip strengthening.  Work on ankle range of motion.  Work on weightbearing activity.  Issue HEP.  Land: Manual therapy to improve bilateral dorsiflexion.  Focused ankle stability strengthening.  Progressed to stair training and squats for functional mobility when able.  Consider instability when able to tolerate.  Lorayne Bender PT DPT 08/11/23 5:16 PM [facility] [address] [place], [place], [address] Phone: [phone]   Fax:  [phone]

## 2023-08-13 ENCOUNTER — Ambulatory Visit (HOSPITAL_BASED_OUTPATIENT_CLINIC_OR_DEPARTMENT_OTHER): Payer: BC Managed Care – PPO | Admitting: Physical Therapy

## 2023-08-18 ENCOUNTER — Ambulatory Visit (HOSPITAL_BASED_OUTPATIENT_CLINIC_OR_DEPARTMENT_OTHER): Payer: BC Managed Care – PPO | Admitting: Physical Therapy

## 2023-08-18 ENCOUNTER — Encounter (HOSPITAL_BASED_OUTPATIENT_CLINIC_OR_DEPARTMENT_OTHER): Payer: Self-pay | Admitting: Physical Therapy

## 2023-08-18 DIAGNOSIS — M25571 Pain in right ankle and joints of right foot: Secondary | ICD-10-CM

## 2023-08-18 DIAGNOSIS — M25572 Pain in left ankle and joints of left foot: Secondary | ICD-10-CM | POA: Diagnosis not present

## 2023-08-18 DIAGNOSIS — R2689 Other abnormalities of gait and mobility: Secondary | ICD-10-CM

## 2023-08-18 DIAGNOSIS — M25672 Stiffness of left ankle, not elsewhere classified: Secondary | ICD-10-CM

## 2023-08-18 DIAGNOSIS — M25671 Stiffness of right ankle, not elsewhere classified: Secondary | ICD-10-CM

## 2023-08-18 NOTE — Therapy (Signed)
OUTPATIENT PHYSICAL THERAPY LOWER EXTREMITY    Patient Name: Rhonda Espinoza MRN: [id number] DOB:1961-09-06, 62 y.o., female Today's Date: 08/18/2023  END OF SESSION:       Past Medical History:  Diagnosis Date   ADD (attention deficit disorder)    Alcohol withdrawal seizure (HCC)    Alcohol-induced pancreatitis    Anxiety    Arthritis    Asthma    allergy induced per patient   Disc disorder    Hyperlipidemia    Hypertension    Pneumonia    Past Surgical History:  Procedure Laterality Date   CESAREAN SECTION     CHOLECYSTECTOMY  2022   FOOT SURGERY Left    FRACTURE SURGERY Right 07/2020   R wrist    HERNIA REPAIR     KNEE ARTHROSCOPY     KYPHOPLASTY N/A 10/13/2018   Procedure: THORACIC 12 KYPHOPLASTY;  Surgeon: Estill Bamberg, MD;  Location: [facility] OR;  Service: Orthopedics;  Laterality: N/A;   KYPHOPLASTY N/A 02/05/2021   Procedure: LUMBAR ONE  AND LUMBAR FOUR KYPHOPLASTY;  Surgeon: Estill Bamberg, MD;  Location: [facility] OR;  Service: Orthopedics;  Laterality: N/A;   Patient Active Problem List   Diagnosis Date Noted   Fatty liver, alcoholic 08/07/2022   Acute pancreatitis 08/06/2022   Acute alcoholic pancreatitis 05/29/2021   Seizures (HCC) 05/18/2021   Hypokalemia 05/18/2021   Acute encephalopathy 05/18/2021   Alcohol withdrawal seizure (HCC) 05/18/2021   ETOH abuse 05/18/2021   Surgery, elective 02/05/2021   History of T12 kyphoplasty 01/24/2019   Hip pain, acute, right 01/16/2019   Acute leg pain, right 01/16/2019   Sciatica of right side without back pain 01/16/2019   Latex precautions, history of latex allergy 11/02/2018   Lesion of right native kidney 08/30/2018   Abnormal MRI, lumbar spine (08/14/2018) 08/23/2018   Lumbar lateral recess stenosis (Left: L2-3) (Bilateral: L3-4) (Right: L4-5) 08/23/2018   Lumbar Grade 1 Retrolisthesis of L2/L3, L3/L4, & L4/L5 08/23/2018   Grade 1 Lumbar Anterolisthesis of L5/S1 08/23/2018   Lumbar facet hypertrophy  (Multilevel) (Bilateral) 08/19/2018   Lumbar foraminal stenosis (Left: L2-3, L3-4) (Bilateral: L4-5, L5-S1) 08/19/2018   Lumbar central spinal stenosis 08/19/2018   DDD (degenerative disc disease), lumbar 08/05/2018   Traumatic compression fracture of T12 (<15%) thoracic vertebra, sequela 08/05/2018   Abnormal x-ray of lumbar spine (08/02/2018) 08/05/2018   Disorder of skeletal system 08/05/2018   Pharmacologic therapy 08/05/2018   Problems influencing health status 08/05/2018   Chronic pain syndrome 08/05/2018   Class 1 obesity with body mass index (BMI) of 33.0 to 33.9 in adult 05/24/2018   Seasonal allergic rhinitis due to pollen 02/03/2017   Chronic low back pain (Bilateral) (R>L) 08/04/2016   Long term current use of opiate analgesic 08/04/2016   Long term prescription opiate use 08/04/2016   Opiate use 08/04/2016   Spondylosis without myelopathy or radiculopathy, lumbar region 08/04/2016   ADD (attention deficit disorder) 05/22/2016   GAD (generalized anxiety disorder) 05/22/2016   Hypertension, essential 05/22/2016   Panic attacks 05/22/2016   Essential hypertension 08/30/2015   Back muscle spasm 01/31/2014   Abnormal LFTs 01/31/2014   Blood glucose elevated 01/31/2014   Insomnia 01/31/2014   Lumbar facet syndrome (Bilateral) (R>L) 01/31/2014   Elevated LFTs 01/31/2014   Avitaminosis D 07/03/2011   Vitamin D deficiency 07/03/2011   HLD (hyperlipidemia) 06/23/2011   Hypertriglyceridemia 06/23/2011    PCP: Dr. Antony Haste  REFERRING PROVIDER: Dr. Jamelle Haring Daws  REFERR Diagnosis  R52 (ICD-10-CM) - Pain,  unspecified  T81.89XA (ICD-10-CM) - Other complications of procedures, not elsewhere classified, initial encounter  S82.892A (ICD-10-CM) - Other fracture of left lower leg, initial encounter for closed fracture  ING DIAG:   THERAPY DIAG:  No diagnosis found.  Rationale for Evaluation and Treatment: Rehabilitation  ONSET DATE: Intital surgerry January of 2024 on  right ankle. Patient can not recall date and not in chart. 2nd surgery on left ankle 11/28/2022 ORIF  SUBJECTIVE:   SUBJECTIVE STATEMENT: Pt reports her MD changed her BP medication recently and she has noticed she's been dizzy more often. Pt states she still has difficulty with walking and navigating stairs.     POOL ACCESS: see above statement     Initial Subjective Th patient had a left ankle ORIF on 11/28/2022. The right ankle was fractured and she had to have a complete reconstruction some time in January of 2024. The patient can not remember exactly and it is not in the chart. She has been out of the boots for about a month. She also has a history of lumbar compression fx's. She has osteoporosis. She is walking without a device.  She also had a fall in which she suffered a fractured wrist  PERTINENT HISTORY: Osteoporosis; lumbar compression fx; broken wrist  PAIN:  Are you having pain? yes: NPRS scale: 3/10   Pain location: lower back  Pain description: ache Aggravating factors: *weight bearing;standing and walking Relieving factors: rest    PRECAUTIONS: Fall  RED FLAGS: None   WEIGHT BEARING RESTRICTIONS: No  FALLS:  Has patient fallen in last 6 months? Yes. Number of falls 3 2 falls resulted in broen andkles and 1 in a broken wrist   LIVING ENVIRONMENT: Small steps into the house   OCCUPATION:  Retired   Hobbies:  Hotel manager , walking   PLOF: Independent  PATIENT GOALS:   Get back to regular activity. Get back to swimming  NEXT MD VISIT:  1 month   OBJECTIVE:   Vitals signs:   BP: 100/60 DIAGNOSTIC FINDINGS:  Per patient: x-rays showing healing   PATIENT SURVEYS:  FOTO   unable to find in system 06/18/23: LEFS: 10/80  COGNITION: Overall cognitive status: Within functional limits for tasks assessed     SENSATION: Numbness in the left 3 middle toes  EDEMA:  Per visual inspection mild bilateral edema in ankles MUSCLE LENGTH:  POSTURE:    Rounded shoulders, forward head, significant left foot external rotation in sitting and standing.  PALPATION: No unexpected tenderness to palpation  LOWER EXTREMITY ROM:  Passive ROM Right eval Left eval R / L 06/18/23  Hip flexion     Hip extension     Hip abduction     Hip adduction     Hip internal rotation     Hip external rotation     Knee flexion     Knee extension     Ankle dorsiflexion 2 degrees Neutral 2d / 5d  Ankle plantarflexion Mild limitation at end range Mild limitation at end range  35d  Ankle inversion     Ankle eversion      (Blank rows = not tested)  LOWER EXTREMITY MMT:  MMT Right eval Left eval R / L 06/18/23  Hip flexion 22.1 20.2 34.6 / 40.3  Hip extension     Hip abduction 31.1 25.4 22.0 / 23.1  Hip adduction     Hip internal rotation     Hip external rotation     Knee flexion  Knee extension 26.8 24.4 48.4 / 39.9  Ankle dorsiflexion 4 4   Ankle plantarflexion Not test to Not tested   Ankle inversion Not tested Not tested   Ankle eversion Not tested Not tested    (Blank rows = not tested)   FUNCTIONAL TESTS:  5 times sit to stand test: Not tolerated 06/18/23  GAIT: Decreased bilateral lower extremity weightbearing.  Flexed posture.  Decreased bilateral hip flexion  06/18/23 off loading R&L wide range lateral sway  TODAY'S TREATMENT:                                                                                                                              11/19 Ankle plantarflexion & dorsiflexion 2 x 10 yellow band EV/IV  yellow 2x15   Manual: Trigger point release to medial gastroc and medial ankle area.  Gentle dorsiflexion stretching.  Review sense of self lysed them to this area.  Trigger point release to the medial plantar fasciitis insertion  08/11/2023 Ankle plantarflexion & dorsiflexion 2 x 10 yellow band EV/IV  yellow 2x15   Standing weight shift 2x10  Standing march 2x10   Reviewed current HEP    Manual:  Trigger point release to medial gastroc and medial ankle area.  Gentle dorsiflexion stretching.  Review sense of self lysed them to this area.  Trigger point release to the medial plantar fasciitis insertion      10/24 Pt seen for aquatic therapy today.  Treatment took place in water 3.5-4.75 ft in depth at the [facility] pool. Temp of water was 91.  Pt entered and exited pool via stairs with step-to pattern.  * 4 ft without support: walking forward/ backward - multiple laps;  side stepping R/L -> with arm addct / abdct with rainbow hand floats (cues for upright posture);  *  farmer carry with single/ bilat rainbow hand float under water with walking backwards/ forward  * step forward/ return to neutral x 5 each LE (improved; no longer challenge) ue support rainbow hand floats *TrA set solid noodle pull down wide stance  x 10  * UE on wall:  3 way toe touch x 8-10 -> UE on noodle: 3 way LE kick x 5 each LE * forward step ups/ retro step downs x 5 each LE, BUE on rails * straddling yellow noodle: cycling forward/backward with UE resting on noodle: 4 widths; add/abd x 20; skiing x 20 * STS at bench in water with feet on blue step x 6 - with cues for hip hinge * breast stroke 2 lengths of therapy pool; elementary breast stroke 6 lengths   10/22 Gait training: Ambulated with single-point cane on left side.  Min cueing for proper gait technique.  Improve gait technique noted with practice.  100 feet performed.  Manual: Trigger point release to medial gastroc and medial ankle area.  Gentle dorsiflexion stretching.  Review sense of self lysed them to this area.  Trigger point  release to the medial plantar fasciitis insertion  Ankle inversion/eversion no resistance 2 x 10  Ankle plantarflexion 2 x 10 yellow band  Seated hip abduction red 2 x 15 Seated long arc quad 2 x 15 red Seated march 2 x 15 red  Initially started all exercises with yellow but RPE on each was 2-3.  Band  adjusted to red RPE around 4-5 Updated HEP.       PATIENT EDUCATION:  Education details: aquatic therapy exercise modifications/progressions Person educated: Patient Education method: Explanation, Demonstration, Tactile cues, Verbal cues, Education comprehension: verbalized understanding, returned demonstration, verbal cues required, tactile cues required, and needs further education  HOME EXERCISE PROGRAM:  [email or web address] URL: [email or web address] Date: 04/30/2023 Prepared by: Lorayne Bender  Exercises - Seated Calf Towel Stretch  - 2 x daily - 7 x weekly - 3 sets - 3 reps - 20sec  hold - Seated Heel Raise  - 2 x daily - 7 x weekly - 3 sets - 10 reps - Ankle Inversion Eversion Towel Slide  - 2 x daily - 7 x weekly - 3 sets - 10 reps  ASSESSMENT:  CLINICAL IMPRESSION: Therapy was able to add in standing weight shifting and march to her program. Her motion is great in her ankles. She continues to have trigger points in her calf. Most of her pain is in her achillis when she is standing and walking. We added in resistance to her EV/IV as well. Her DF bilateral was measured at 10 degrees.  See below for goal specific progress    RECERT- Pt presents today 7 weeks post initial evaluation due to a fall with broken ribs.  She is reassessed and certification period extended.  She is demonstrating an improvement in hip flex and knee extension  strength but slight decrease in hip abd.  She reports no ankle pain.  Left ankle ROM improved.  She ambs to session today without an assistive device.  She continues to have functional deficits due to poor balance and gait dysfunction. She is a high fall risk.  She did amb to and from setting today with difficulty.  Encouragement to bring an AD is given. She will benefit form skilled PT intervention with initial sessions being aquatic based for added safety in environment allowing improvement in strength and gait/balance before transitioning to  land. She will require use of lift but will progress to using stairs.    OBJECTIVE IMPAIRMENTS: Abnormal gait, decreased activity tolerance, decreased knowledge of use of DME, difficulty walking, decreased ROM, decreased strength, and pain.   ACTIVITY LIMITATIONS: carrying, lifting, bending, sitting, standing, squatting, stairs, transfers, and locomotion level  PARTICIPATION LIMITATIONS: meal prep, cleaning, laundry, driving, shopping, community activity, , and yard work  PERSONAL FACTORS: 3+ comorbidities: Osteoporosis, multilevel lumbar fracture 2020, 2022; frequent falls  are also affecting patient's functional outcome.   REHAB POTENTIAL: Good  CLINICAL DECISION MAKING: Evolving/moderate complexity declining general functional mobility  EVALUATION COMPLEXITY: Moderate   GOALS: Goals reviewed with patient? Yes  SHORT TERM GOALS: Target date: 07/17/23   Patient will increase gross bilateral lower extremity strength by 5 pounds. Baseline: Goal status: Met 06/18/23  2.  Patient will increase bilateral dorsiflexion motion by 5 degrees Baseline:  Goal status: achieved full 11/11  3.  Patient will stand for greater than 15 minutes without increased pain Baseline:  Goal status:progressing but not met 11/11  4.  Patient will be independent with basic exercise program Baseline:  Goal status: Initiated general strengthening exercises 10/23  5. Pt will tolerate full aquatic sessions consistently without increase in pain and with improving function to demonstrate good toleration and effectiveness of intervention.   Baseline Goal status: Met 07/16/23    LONG TERM GOALS: Target date: 08/28/23    1. Patient will go up and down 8 steps with reciprocal gait pattern safely without increased pain Baseline:  Goal status: INITIAL  2.  Patient will ambulate community distances without increased pain Baseline:  Goal status: INITIAL  3.  Patient will have a complete land and pool  program to promote strength and functional mobility Baseline:  Goal status: INITIAL  4. Pt will tolerate walking to and from setting and engaging in aquatic therapy session without excessive fatigue or increase in pain to demonstrate improved toleration to activity.  Baseline: unable Goal Status: In progress 07/16/23  PLAN:  PT FREQUENCY: 2x/week  PT DURATION: 8 weeks  PLANNED INTERVENTIONS: Therapeutic exercises, Therapeutic activity, Neuromuscular re-education, Balance training, Gait training, Patient/Family education, Self Care, Joint mobilization, Stair training, DME instructions, Aquatic Therapy, Dry Needling, Electrical stimulation, Cryotherapy, Moist heat, Taping, Manual therapy, and Re-evaluation.   PLAN FOR NEXT SESSION:   Aquatics: Gait training; stairs.  Work on bilateral core and hip strengthening.  Work on ankle range of motion.  Work on weightbearing activity.  Issue HEP.  Land: Manual therapy to improve bilateral dorsiflexion.  Focused ankle stability strengthening.  Progressed to stair training and squats for functional mobility when able.  Consider instability when able to tolerate.  Lorayne Bender PT DPT 08/18/23 4:23 PM [facility] [address] [place], [place], [address] Phone: [phone]   Fax:  [phone]

## 2023-08-19 ENCOUNTER — Encounter (HOSPITAL_BASED_OUTPATIENT_CLINIC_OR_DEPARTMENT_OTHER): Payer: Self-pay | Admitting: Physical Therapy

## 2023-08-20 ENCOUNTER — Encounter (HOSPITAL_BASED_OUTPATIENT_CLINIC_OR_DEPARTMENT_OTHER): Payer: Self-pay | Admitting: Physical Therapy

## 2023-08-20 ENCOUNTER — Ambulatory Visit (HOSPITAL_BASED_OUTPATIENT_CLINIC_OR_DEPARTMENT_OTHER): Payer: BC Managed Care – PPO | Admitting: Physical Therapy

## 2023-08-20 DIAGNOSIS — M25572 Pain in left ankle and joints of left foot: Secondary | ICD-10-CM | POA: Diagnosis not present

## 2023-08-20 DIAGNOSIS — M25671 Stiffness of right ankle, not elsewhere classified: Secondary | ICD-10-CM

## 2023-08-20 DIAGNOSIS — M25672 Stiffness of left ankle, not elsewhere classified: Secondary | ICD-10-CM

## 2023-08-20 DIAGNOSIS — M25571 Pain in right ankle and joints of right foot: Secondary | ICD-10-CM

## 2023-08-20 NOTE — Therapy (Signed)
OUTPATIENT PHYSICAL THERAPY LOWER EXTREMITY TREATMENT    Patient Name: Rhonda Espinoza MRN: [id number] DOB:01/21/1961, 62 y.o., female Today's Date: 08/20/2023  END OF SESSION:  PT End of Session - 08/20/23 1541     Visit Number 11    Number of Visits 21    Date for PT Re-Evaluation 08/28/23    PT Start Time 1534    PT Stop Time 1612    PT Time Calculation (min) 38 min    Activity Tolerance Patient tolerated treatment well    Behavior During Therapy WFL for tasks assessed/performed                 Past Medical History:  Diagnosis Date   ADD (attention deficit disorder)    Alcohol withdrawal seizure (HCC)    Alcohol-induced pancreatitis    Anxiety    Arthritis    Asthma    allergy induced per patient   Disc disorder    Hyperlipidemia    Hypertension    Pneumonia    Past Surgical History:  Procedure Laterality Date   CESAREAN SECTION     CHOLECYSTECTOMY  2022   FOOT SURGERY Left    FRACTURE SURGERY Right 07/2020   R wrist    HERNIA REPAIR     KNEE ARTHROSCOPY     KYPHOPLASTY N/A 10/13/2018   Procedure: THORACIC 12 KYPHOPLASTY;  Surgeon: Estill Bamberg, MD;  Location: [facility] OR;  Service: Orthopedics;  Laterality: N/A;   KYPHOPLASTY N/A 02/05/2021   Procedure: LUMBAR ONE  AND LUMBAR FOUR KYPHOPLASTY;  Surgeon: Estill Bamberg, MD;  Location: [facility] OR;  Service: Orthopedics;  Laterality: N/A;   Patient Active Problem List   Diagnosis Date Noted   Fatty liver, alcoholic 08/07/2022   Acute pancreatitis 08/06/2022   Acute alcoholic pancreatitis 05/29/2021   Seizures (HCC) 05/18/2021   Hypokalemia 05/18/2021   Acute encephalopathy 05/18/2021   Alcohol withdrawal seizure (HCC) 05/18/2021   ETOH abuse 05/18/2021   Surgery, elective 02/05/2021   History of T12 kyphoplasty 01/24/2019   Hip pain, acute, right 01/16/2019   Acute leg pain, right 01/16/2019   Sciatica of right side without back pain 01/16/2019   Latex precautions, history of latex allergy 11/02/2018    Lesion of right native kidney 08/30/2018   Abnormal MRI, lumbar spine (08/14/2018) 08/23/2018   Lumbar lateral recess stenosis (Left: L2-3) (Bilateral: L3-4) (Right: L4-5) 08/23/2018   Lumbar Grade 1 Retrolisthesis of L2/L3, L3/L4, & L4/L5 08/23/2018   Grade 1 Lumbar Anterolisthesis of L5/S1 08/23/2018   Lumbar facet hypertrophy (Multilevel) (Bilateral) 08/19/2018   Lumbar foraminal stenosis (Left: L2-3, L3-4) (Bilateral: L4-5, L5-S1) 08/19/2018   Lumbar central spinal stenosis 08/19/2018   DDD (degenerative disc disease), lumbar 08/05/2018   Traumatic compression fracture of T12 (<15%) thoracic vertebra, sequela 08/05/2018   Abnormal x-ray of lumbar spine (08/02/2018) 08/05/2018   Disorder of skeletal system 08/05/2018   Pharmacologic therapy 08/05/2018   Problems influencing health status 08/05/2018   Chronic pain syndrome 08/05/2018   Class 1 obesity with body mass index (BMI) of 33.0 to 33.9 in adult 05/24/2018   Seasonal allergic rhinitis due to pollen 02/03/2017   Chronic low back pain (Bilateral) (R>L) 08/04/2016   Long term current use of opiate analgesic 08/04/2016   Long term prescription opiate use 08/04/2016   Opiate use 08/04/2016   Spondylosis without myelopathy or radiculopathy, lumbar region 08/04/2016   ADD (attention deficit disorder) 05/22/2016   GAD (generalized anxiety disorder) 05/22/2016   Hypertension, essential 05/22/2016  Panic attacks 05/22/2016   Essential hypertension 08/30/2015   Back muscle spasm 01/31/2014   Abnormal LFTs 01/31/2014   Blood glucose elevated 01/31/2014   Insomnia 01/31/2014   Lumbar facet syndrome (Bilateral) (R>L) 01/31/2014   Elevated LFTs 01/31/2014   Avitaminosis D 07/03/2011   Vitamin D deficiency 07/03/2011   HLD (hyperlipidemia) 06/23/2011   Hypertriglyceridemia 06/23/2011    PCP: Dr. Antony Haste  REFERRING PROVIDER: Dr. Jamelle Haring Daws  REFERR Diagnosis  R52 (ICD-10-CM) - Pain, unspecified  T81.89XA (ICD-10-CM) - Other  complications of procedures, not elsewhere classified, initial encounter  S82.892A (ICD-10-CM) - Other fracture of left lower leg, initial encounter for closed fracture  ING DIAG:   THERAPY DIAG:  Pain in left ankle and joints of left foot  Pain in right ankle and joints of right foot  Stiffness of right ankle, not elsewhere classified  Stiffness of left ankle, not elsewhere classified  Rationale for Evaluation and Treatment: Rehabilitation  ONSET DATE: Intital surgerry January of 2024 on right ankle. Patient can not recall date and not in chart. 2nd surgery on left ankle 11/28/2022 ORIF  SUBJECTIVE:   SUBJECTIVE STATEMENT: "I've made a lot of progress."  Pt states she is nervous about getting in water if it is cold.     POOL ACCESS: Plans to join a pool in spring.     Initial Subjective Th patient had a left ankle ORIF on 11/28/2022. The right ankle was fractured and she had to have a complete reconstruction some time in January of 2024. The patient can not remember exactly and it is not in the chart. She has been out of the boots for about a month. She also has a history of lumbar compression fx's. She has osteoporosis. She is walking without a device.  She also had a fall in which she suffered a fractured wrist  PERTINENT HISTORY: Osteoporosis; lumbar compression fx; broken wrist  PAIN:  Are you having pain? no: NPRS scale: 0/10 Pain location:  Pain description:  Aggravating factors: weight bearing;standing and walking Relieving factors: rest    PRECAUTIONS: Fall  RED FLAGS: None   WEIGHT BEARING RESTRICTIONS: No  FALLS:  Has patient fallen in last 6 months? Yes. Number of falls 3 2 falls resulted in broen andkles and 1 in a broken wrist   LIVING ENVIRONMENT: Small steps into the house   OCCUPATION:  Retired   Hobbies:  Hotel manager , walking   PLOF: Independent  PATIENT GOALS:   Get back to regular activity. Get back to swimming  NEXT MD VISIT:  1  month   OBJECTIVE:   Vitals signs:   BP: 100/60 DIAGNOSTIC FINDINGS:  Per patient: x-rays showing healing   PATIENT SURVEYS:  FOTO   unable to find in system 06/18/23: LEFS: 10/80  COGNITION: Overall cognitive status: Within functional limits for tasks assessed     SENSATION: Numbness in the left 3 middle toes  EDEMA:  Per visual inspection mild bilateral edema in ankles MUSCLE LENGTH:  POSTURE:   Rounded shoulders, forward head, significant left foot external rotation in sitting and standing.  PALPATION: No unexpected tenderness to palpation  LOWER EXTREMITY ROM:  Passive ROM Right eval Left eval R / L 06/18/23  Hip flexion     Hip extension     Hip abduction     Hip adduction     Hip internal rotation     Hip external rotation     Knee flexion     Knee extension  Ankle dorsiflexion 2 degrees Neutral 2d / 5d  Ankle plantarflexion Mild limitation at end range Mild limitation at end range  35d  Ankle inversion     Ankle eversion      (Blank rows = not tested)  LOWER EXTREMITY MMT:  MMT Right eval Left eval R / L 06/18/23  Hip flexion 22.1 20.2 34.6 / 40.3  Hip extension     Hip abduction 31.1 25.4 22.0 / 23.1  Hip adduction     Hip internal rotation     Hip external rotation     Knee flexion     Knee extension 26.8 24.4 48.4 / 39.9  Ankle dorsiflexion 4 4   Ankle plantarflexion Not test to Not tested   Ankle inversion Not tested Not tested   Ankle eversion Not tested Not tested    (Blank rows = not tested)   FUNCTIONAL TESTS:  5 times sit to stand test: Not tolerated 06/18/23  GAIT: Decreased bilateral lower extremity weightbearing.  Flexed posture.  Decreased bilateral hip flexion  06/18/23 off loading R&L wide range lateral sway  TODAY'S TREATMENT:                                                                                                                              11/21 Pt seen for aquatic therapy today.  Treatment took place in  water 3.5-4.75 ft in depth at the [facility] pool. Temp of water was 91.  Pt entered/exited the pool via stairs independently with bilat rail. * walking unsupported in 3 ft 6" forward/ backwards  * noodle under arms- >front flutter kick -> no UE support doggy paddle 3 lengths of pool;  breast stroke 1.5 lengths of pool (improved from 07/23/23) * in 4+ ft of water- high knee marching forward/ backward;  side stepping R/L  * straddling noodle:  cycling  * Wide bOS - eyes closed -> NBOS eyes closed with skulling to steady * tandem gait forward/ backward without UE support  * forward walking kick  * partial split squats near wall x 10 * UE on rainbow hand floats -> no UE support with hip abdct/ addct 2 x5, leg swings into hip flex/ ext x 10  * back against wall with LE on solid noodle for hamstring, adductor stretch   Pt requires the buoyancy and hydrostatic pressure of water for support, and to offload joints by unweighting joint load by at least 50 % in navel deep water and by at least 75-80% in chest to neck deep water.  Viscosity of the water is needed for resistance of strengthening. Water current perturbations provides challenge to standing balance requiring increased core activation.    11/19 Ankle plantarflexion & dorsiflexion 2 x 10 yellow band EV/IV  yellow 2x15   Manual: Trigger point release to medial gastroc and medial ankle area.  Gentle dorsiflexion stretching.  Review sense of self lysed them to this area.  Trigger point release  to the medial plantar fasciitis insertion   SAQ 2x15 each leg   Seated hip abduction 2x15 red   Standing air-ex narrow base EO 2x30 secconds  EC 1x 15 seconds with CGA     08/11/2023 Ankle plantarflexion & dorsiflexion 2 x 10 yellow band EV/IV  yellow 2x15   Standing weight shift 2x10  Standing march 2x10   Reviewed current HEP    Manual: Trigger point release to medial gastroc and medial ankle area.  Gentle dorsiflexion  stretching.  Review sense of self lysed them to this area.  Trigger point release to the medial plantar fasciitis insertion      10/24 Pt seen for aquatic therapy today.  Treatment took place in water 3.5-4.75 ft in depth at the [facility] pool. Temp of water was 91.  Pt entered and exited pool via stairs with step-to pattern.  * 4 ft without support: walking forward/ backward - multiple laps;  side stepping R/L -> with arm addct / abdct with rainbow hand floats (cues for upright posture);  *  farmer carry with single/ bilat rainbow hand float under water with walking backwards/ forward  * step forward/ return to neutral x 5 each LE (improved; no longer challenge) ue support rainbow hand floats *TrA set solid noodle pull down wide stance  x 10  * UE on wall:  3 way toe touch x 8-10 -> UE on noodle: 3 way LE kick x 5 each LE * forward step ups/ retro step downs x 5 each LE, BUE on rails * straddling yellow noodle: cycling forward/backward with UE resting on noodle: 4 widths; add/abd x 20; skiing x 20 * STS at bench in water with feet on blue step x 6 - with cues for hip hinge * breast stroke 2 lengths of therapy pool; elementary breast stroke 6 lengths   10/22 Gait training: Ambulated with single-point cane on left side.  Min cueing for proper gait technique.  Improve gait technique noted with practice.  100 feet performed.  Manual: Trigger point release to medial gastroc and medial ankle area.  Gentle dorsiflexion stretching.  Review sense of self lysed them to this area.  Trigger point release to the medial plantar fasciitis insertion  Ankle inversion/eversion no resistance 2 x 10  Ankle plantarflexion 2 x 10 yellow band  Seated hip abduction red 2 x 15 Seated long arc quad 2 x 15 red Seated march 2 x 15 red  Initially started all exercises with yellow but RPE on each was 2-3.  Band adjusted to red RPE around 4-5 Updated HEP.       PATIENT EDUCATION:  Education  details: aquatic therapy exercise modifications/progressions;  Person educated: Patient Education method: Explanation, Demonstration, Actor cues, Verbal cues, Education comprehension: verbalized understanding, returned demonstration, verbal cues required, tactile cues required, and needs further education  HOME EXERCISE PROGRAM:  [email or web address] URL: [email or web address] Date: 04/30/2023 Prepared by: Lorayne Bender  Exercises - Seated Calf Towel Stretch  - 2 x daily - 7 x weekly - 3 sets - 3 reps - 20sec  hold - Seated Heel Raise  - 2 x daily - 7 x weekly - 3 sets - 10 reps - Ankle Inversion Eversion Towel Slide  - 2 x daily - 7 x weekly - 3 sets - 10 reps  ASSESSMENT:  CLINICAL IMPRESSION: Pt reporting improved tolerance and ability to complete front flutter kick and breast stroke kick than 1 month ago.  She was able to progress to NBOS  with eyes closed with use of hands skulling to steady.  Pt verbalized readiness to d/c from aquatics with no aquatic HEP.  She plans to utilize a pool in spring and swim, but will likely not get in pool now that the weather is cold.  She requests updated HEP in the event she d/c in next visit or two due to high deductible at beginning of year.Therapist to check unmet goals next session.      RECERT- Pt presents today 7 weeks post initial evaluation due to a fall with broken ribs.  She is reassessed and certification period extended.  She is demonstrating an improvement in hip flex and knee extension  strength but slight decrease in hip abd.  She reports no ankle pain.  Left ankle ROM improved.  She ambs to session today without an assistive device.  She continues to have functional deficits due to poor balance and gait dysfunction. She is a high fall risk.  She did amb to and from setting today with difficulty.  Encouragement to bring an AD is given. She will benefit form skilled PT intervention with initial sessions being aquatic based for added  safety in environment allowing improvement in strength and gait/balance before transitioning to land. She will require use of lift but will progress to using stairs.    OBJECTIVE IMPAIRMENTS: Abnormal gait, decreased activity tolerance, decreased knowledge of use of DME, difficulty walking, decreased ROM, decreased strength, and pain.   ACTIVITY LIMITATIONS: carrying, lifting, bending, sitting, standing, squatting, stairs, transfers, and locomotion level  PARTICIPATION LIMITATIONS: meal prep, cleaning, laundry, driving, shopping, community activity, , and yard work  PERSONAL FACTORS: 3+ comorbidities: Osteoporosis, multilevel lumbar fracture 2020, 2022; frequent falls  are also affecting patient's functional outcome.   REHAB POTENTIAL: Good  CLINICAL DECISION MAKING: Evolving/moderate complexity declining general functional mobility  EVALUATION COMPLEXITY: Moderate   GOALS: Goals reviewed with patient? Yes  SHORT TERM GOALS: Target date: 07/17/23   Patient will increase gross bilateral lower extremity strength by 5 pounds. Baseline: Goal status: Met 06/18/23  2.  Patient will increase bilateral dorsiflexion motion by 5 degrees Baseline:  Goal status: achieved full 11/11  3.  Patient will stand for greater than 15 minutes without increased pain Baseline:  Goal status:progressing but not met 11/11  4.  Patient will be independent with basic exercise program Baseline:  Goal status: Initiated general strengthening exercises 10/23  5. Pt will tolerate full aquatic sessions consistently without increase in pain and with improving function to demonstrate good toleration and effectiveness of intervention.   Baseline Goal status: Met 07/16/23    LONG TERM GOALS: Target date: 08/28/23    1. Patient will go up and down 8 steps with reciprocal gait pattern safely without increased pain Baseline:  Goal status: INITIAL  2.  Patient will ambulate community distances without  increased pain Baseline:  Goal status: INITIAL  3.  Patient will have a complete land and pool program to promote strength and functional mobility Baseline:  Goal status: INITIAL  4. Pt will tolerate walking to and from setting and engaging in aquatic therapy session without excessive fatigue or increase in pain to demonstrate improved toleration to activity.  Baseline: unable at eval Goal Status: Partially met 08/20/23  PLAN:  PT FREQUENCY: 2x/week  PT DURATION: 8 weeks  PLANNED INTERVENTIONS: Therapeutic exercises, Therapeutic activity, Neuromuscular re-education, Balance training, Gait training, Patient/Family education, Self Care, Joint mobilization, Stair training, DME instructions, Aquatic Therapy, Dry Needling, Electrical stimulation, Cryotherapy, Moist heat,  Taping, Manual therapy, and Re-evaluation.   PLAN FOR NEXT SESSION:  See above.

## 2023-08-24 ENCOUNTER — Ambulatory Visit (HOSPITAL_BASED_OUTPATIENT_CLINIC_OR_DEPARTMENT_OTHER): Payer: BC Managed Care – PPO | Admitting: Physical Therapy

## 2023-08-26 ENCOUNTER — Ambulatory Visit (HOSPITAL_BASED_OUTPATIENT_CLINIC_OR_DEPARTMENT_OTHER): Payer: BC Managed Care – PPO | Admitting: Physical Therapy

## 2023-08-26 DIAGNOSIS — M25672 Stiffness of left ankle, not elsewhere classified: Secondary | ICD-10-CM

## 2023-08-26 DIAGNOSIS — M25572 Pain in left ankle and joints of left foot: Secondary | ICD-10-CM | POA: Diagnosis not present

## 2023-08-26 DIAGNOSIS — R2689 Other abnormalities of gait and mobility: Secondary | ICD-10-CM

## 2023-08-26 DIAGNOSIS — M25671 Stiffness of right ankle, not elsewhere classified: Secondary | ICD-10-CM

## 2023-08-26 DIAGNOSIS — M25571 Pain in right ankle and joints of right foot: Secondary | ICD-10-CM

## 2023-08-26 NOTE — Therapy (Signed)
OUTPATIENT PHYSICAL THERAPY LOWER EXTREMITY TREATMENT    Patient Name: Rhonda Espinoza MRN: [id number] DOB:11-29-1960, 62 y.o., female Today's Date: 08/29/2023  END OF SESSION:  PT End of Session - 08/29/23 0918     Visit Number 12    Number of Visits 21    Date for PT Re-Evaluation 10/24/23    PT Start Time 1515    PT Stop Time 1558    PT Time Calculation (min) 43 min    Activity Tolerance Patient tolerated treatment well    Behavior During Therapy WFL for tasks assessed/performed                  Past Medical History:  Diagnosis Date   ADD (attention deficit disorder)    Alcohol withdrawal seizure (HCC)    Alcohol-induced pancreatitis    Anxiety    Arthritis    Asthma    allergy induced per patient   Disc disorder    Hyperlipidemia    Hypertension    Pneumonia    Past Surgical History:  Procedure Laterality Date   CESAREAN SECTION     CHOLECYSTECTOMY  2022   FOOT SURGERY Left    FRACTURE SURGERY Right 07/2020   R wrist    HERNIA REPAIR     KNEE ARTHROSCOPY     KYPHOPLASTY N/A 10/13/2018   Procedure: THORACIC 12 KYPHOPLASTY;  Surgeon: Estill Bamberg, MD;  Location: [facility] OR;  Service: Orthopedics;  Laterality: N/A;   KYPHOPLASTY N/A 02/05/2021   Procedure: LUMBAR ONE  AND LUMBAR FOUR KYPHOPLASTY;  Surgeon: Estill Bamberg, MD;  Location: [facility] OR;  Service: Orthopedics;  Laterality: N/A;   Patient Active Problem List   Diagnosis Date Noted   Fatty liver, alcoholic 08/07/2022   Acute pancreatitis 08/06/2022   Acute alcoholic pancreatitis 05/29/2021   Seizures (HCC) 05/18/2021   Hypokalemia 05/18/2021   Acute encephalopathy 05/18/2021   Alcohol withdrawal seizure (HCC) 05/18/2021   ETOH abuse 05/18/2021   Surgery, elective 02/05/2021   History of T12 kyphoplasty 01/24/2019   Hip pain, acute, right 01/16/2019   Acute leg pain, right 01/16/2019   Sciatica of right side without back pain 01/16/2019   Latex precautions, history of latex allergy 11/02/2018    Lesion of right native kidney 08/30/2018   Abnormal MRI, lumbar spine (08/14/2018) 08/23/2018   Lumbar lateral recess stenosis (Left: L2-3) (Bilateral: L3-4) (Right: L4-5) 08/23/2018   Lumbar Grade 1 Retrolisthesis of L2/L3, L3/L4, & L4/L5 08/23/2018   Grade 1 Lumbar Anterolisthesis of L5/S1 08/23/2018   Lumbar facet hypertrophy (Multilevel) (Bilateral) 08/19/2018   Lumbar foraminal stenosis (Left: L2-3, L3-4) (Bilateral: L4-5, L5-S1) 08/19/2018   Lumbar central spinal stenosis 08/19/2018   DDD (degenerative disc disease), lumbar 08/05/2018   Traumatic compression fracture of T12 (<15%) thoracic vertebra, sequela 08/05/2018   Abnormal x-ray of lumbar spine (08/02/2018) 08/05/2018   Disorder of skeletal system 08/05/2018   Pharmacologic therapy 08/05/2018   Problems influencing health status 08/05/2018   Chronic pain syndrome 08/05/2018   Class 1 obesity with body mass index (BMI) of 33.0 to 33.9 in adult 05/24/2018   Seasonal allergic rhinitis due to pollen 02/03/2017   Chronic low back pain (Bilateral) (R>L) 08/04/2016   Long term current use of opiate analgesic 08/04/2016   Long term prescription opiate use 08/04/2016   Opiate use 08/04/2016   Spondylosis without myelopathy or radiculopathy, lumbar region 08/04/2016   ADD (attention deficit disorder) 05/22/2016   GAD (generalized anxiety disorder) 05/22/2016   Hypertension, essential 05/22/2016  Panic attacks 05/22/2016   Essential hypertension 08/30/2015   Back muscle spasm 01/31/2014   Abnormal LFTs 01/31/2014   Blood glucose elevated 01/31/2014   Insomnia 01/31/2014   Lumbar facet syndrome (Bilateral) (R>L) 01/31/2014   Elevated LFTs 01/31/2014   Avitaminosis D 07/03/2011   Vitamin D deficiency 07/03/2011   HLD (hyperlipidemia) 06/23/2011   Hypertriglyceridemia 06/23/2011    PCP: Dr. Antony Haste  REFERRING PROVIDER: Dr. Jamelle Haring Daws  REFERR Diagnosis  R52 (ICD-10-CM) - Pain, unspecified  T81.89XA (ICD-10-CM) -  Other complications of procedures, not elsewhere classified, initial encounter  S82.892A (ICD-10-CM) - Other fracture of left lower leg, initial encounter for closed fracture  ING DIAG:   THERAPY DIAG:  Pain in left ankle and joints of left foot  Pain in right ankle and joints of right foot  Stiffness of right ankle, not elsewhere classified  Stiffness of left ankle, not elsewhere classified  Other abnormalities of gait and mobility  Rationale for Evaluation and Treatment: Rehabilitation  ONSET DATE: Intital surgerry January of 2024 on right ankle. Patient can not recall date and not in chart. 2nd surgery on left ankle 11/28/2022 ORIF  SUBJECTIVE:   SUBJECTIVE STATEMENT: Feels like she has been overdoing it recently.  She reports some soreness in her ankles.  She has been doing a lot of walking and activity.    POOL ACCESS: Plans to join a pool in spring.     Initial Subjective Th patient had a left ankle ORIF on 11/28/2022. The right ankle was fractured and she had to have a complete reconstruction some time in January of 2024. The patient can not remember exactly and it is not in the chart. She has been out of the boots for about a month. She also has a history of lumbar compression fx's. She has osteoporosis. She is walking without a device.  She also had a fall in which she suffered a fractured wrist  PERTINENT HISTORY: Osteoporosis; lumbar compression fx; broken wrist  PAIN:  Are you having pain? no: NPRS scale: 0/10 Pain location:  Pain description:  Aggravating factors: weight bearing;standing and walking Relieving factors: rest    PRECAUTIONS: Fall  RED FLAGS: None   WEIGHT BEARING RESTRICTIONS: No  FALLS:  Has patient fallen in last 6 months? Yes. Number of falls 3 2 falls resulted in broen andkles and 1 in a broken wrist   LIVING ENVIRONMENT: Small steps into the house   OCCUPATION:  Retired   Hobbies:  Hotel manager , walking   PLOF:  Independent  PATIENT GOALS:   Get back to regular activity. Get back to swimming  NEXT MD VISIT:  1 month   OBJECTIVE:   Vitals signs:   BP: 100/60 DIAGNOSTIC FINDINGS:  Per patient: x-rays showing healing   PATIENT SURVEYS:  FOTO   unable to find in system 06/18/23: LEFS: 10/80  COGNITION: Overall cognitive status: Within functional limits for tasks assessed     SENSATION: Numbness in the left 3 middle toes  EDEMA:  Per visual inspection mild bilateral edema in ankles MUSCLE LENGTH:  POSTURE:   Rounded shoulders, forward head, significant left foot external rotation in sitting and standing.  PALPATION: No unexpected tenderness to palpation  LOWER EXTREMITY ROM:  Passive ROM Right eval Left eval R / L  06/18/23 Right 11/28 Left 11/28  Hip flexion       Hip extension       Hip abduction       Hip adduction  Hip internal rotation       Hip external rotation       Knee flexion       Knee extension       Ankle dorsiflexion 2 degrees Neutral 2d / 5d 7 7  Ankle plantarflexion Mild limitation at end range Mild limitation at end range  35d    Ankle inversion       Ankle eversion        (Blank rows = not tested)  LOWER EXTREMITY MMT:  MMT Right eval Left eval R / L 06/18/23 Right 11/28 Left 11/28  Hip flexion 22.1 20.2 34.6 / 40.3 36.2 40  Hip extension       Hip abduction 31.1 25.4 22.0 / 23.1 28.2 27  Hip adduction       Hip internal rotation       Hip external rotation       Knee flexion       Knee extension 26.8 24.4 48.4 / 39.9    Ankle dorsiflexion 4 4     Ankle plantarflexion Not test to Not tested     Ankle inversion Not tested Not tested     Ankle eversion Not tested Not tested      (Blank rows = not tested)   FUNCTIONAL TESTS:  5 times sit to stand test: Not tolerated 06/18/23  GAIT: Decreased bilateral lower extremity weightbearing.  Flexed posture.  Decreased bilateral hip flexion  06/18/23 off loading R&L wide range lateral  sway  TODAY'S TREATMENT:                                                                                                                              11/30 Ankle plantarflexion & dorsiflexion 2 x 10 yellow band EV/IV  yellow 2x15   Manual: Trigger point release to medial gastroc and medial ankle area.  Gentle dorsiflexion stretching.  Review sense of self lysed them to this area.  Trigger point release to the medial plantar fasciitis insertion   SAQ 2x15 each leg     Standing air-ex narrow base EO 2x30 secconds  EC 1x 15 seconds with CGA    Wobble board: Forward back x 20 Side-to-side x 20 Counterclockwise x 20 Clockwise x 20 with each leg 11/21 Pt seen for aquatic therapy today.  Treatment took place in water 3.5-4.75 ft in depth at the [facility] pool. Temp of water was 91.  Pt entered/exited the pool via stairs independently with bilat rail. * walking unsupported in 3 ft 6" forward/ backwards  * noodle under arms- >front flutter kick -> no UE support doggy paddle 3 lengths of pool;  breast stroke 1.5 lengths of pool (improved from 07/23/23) * in 4+ ft of water- high knee marching forward/ backward;  side stepping R/L  * straddling noodle:  cycling  * Wide bOS - eyes closed -> NBOS eyes closed with skulling to steady * tandem gait forward/ backward without  UE support  * forward walking kick  * partial split squats near wall x 10 * UE on rainbow hand floats -> no UE support with hip abdct/ addct 2 x5, leg swings into hip flex/ ext x 10  * back against wall with LE on solid noodle for hamstring, adductor stretch   Pt requires the buoyancy and hydrostatic pressure of water for support, and to offload joints by unweighting joint load by at least 50 % in navel deep water and by at least 75-80% in chest to neck deep water.  Viscosity of the water is needed for resistance of strengthening. Water current perturbations provides challenge to standing balance requiring increased  core activation.    11/19 Ankle plantarflexion & dorsiflexion 2 x 10 yellow band EV/IV  yellow 2x15   Manual: Trigger point release to medial gastroc and medial ankle area.  Gentle dorsiflexion stretching.  Review sense of self lysed them to this area.  Trigger point release to the medial plantar fasciitis insertion   SAQ 2x15 each leg   Seated hip abduction 2x15 red   Standing air-ex narrow base EO 2x30 secconds  EC 1x 15 seconds with CGA     08/11/2023 Ankle plantarflexion & dorsiflexion 2 x 10 yellow band EV/IV  yellow 2x15   Standing weight shift 2x10  Standing march 2x10   Reviewed current HEP    Manual: Trigger point release to medial gastroc and medial ankle area.  Gentle dorsiflexion stretching.  Review sense of self lysed them to this area.  Trigger point release to the medial plantar fasciitis insertion      10/24 Pt seen for aquatic therapy today.  Treatment took place in water 3.5-4.75 ft in depth at the [facility] pool. Temp of water was 91.  Pt entered and exited pool via stairs with step-to pattern.  * 4 ft without support: walking forward/ backward - multiple laps;  side stepping R/L -> with arm addct / abdct with rainbow hand floats (cues for upright posture);  *  farmer carry with single/ bilat rainbow hand float under water with walking backwards/ forward  * step forward/ return to neutral x 5 each LE (improved; no longer challenge) ue support rainbow hand floats *TrA set solid noodle pull down wide stance  x 10  * UE on wall:  3 way toe touch x 8-10 -> UE on noodle: 3 way LE kick x 5 each LE * forward step ups/ retro step downs x 5 each LE, BUE on rails * straddling yellow noodle: cycling forward/backward with UE resting on noodle: 4 widths; add/abd x 20; skiing x 20 * STS at bench in water with feet on blue step x 6 - with cues for hip hinge * breast stroke 2 lengths of therapy pool; elementary breast stroke 6 lengths   10/22 Gait  training: Ambulated with single-point cane on left side.  Min cueing for proper gait technique.  Improve gait technique noted with practice.  100 feet performed.  Manual: Trigger point release to medial gastroc and medial ankle area.  Gentle dorsiflexion stretching.  Review sense of self lysed them to this area.  Trigger point release to the medial plantar fasciitis insertion  Ankle inversion/eversion no resistance 2 x 10  Ankle plantarflexion 2 x 10 yellow band  Seated hip abduction red 2 x 15 Seated long arc quad 2 x 15 red Seated march 2 x 15 red  Initially started all exercises with yellow but RPE on each was  2-3.  Band adjusted to red RPE around 4-5 Updated HEP.       PATIENT EDUCATION:  Education details: aquatic therapy exercise modifications/progressions;  Person educated: Patient Education method: Explanation, Demonstration, Actor cues, Verbal cues, Education comprehension: verbalized understanding, returned demonstration, verbal cues required, tactile cues required, and needs further education  HOME EXERCISE PROGRAM:  [email or web address] URL: [email or web address] Date: 04/30/2023 Prepared by: Lorayne Bender  Exercises - Seated Calf Towel Stretch  - 2 x daily - 7 x weekly - 3 sets - 3 reps - 20sec  hold - Seated Heel Raise  - 2 x daily - 7 x weekly - 3 sets - 10 reps - Ankle Inversion Eversion Towel Slide  - 2 x daily - 7 x weekly - 3 sets - 10 reps  ASSESSMENT:  CLINICAL IMPRESSION: Overall the patient is making progress.  Her ankle motion and strength have improved significantly.  She is performing more activity in the community.  She has been walking more.  She continues to have some pain.  She has trigger points in her medial calf and mild pain in her ankles.  We have been working on focused ankle strengthening.  We have also been progressing towards stability exercises and standing exercises.  She continues to have some difficulty with stairs.  She would  benefit from further skilled therapy 1 week 8.  See below for goal specific progress.    RECERT- Pt presents today 7 weeks post initial evaluation due to a fall with broken ribs.  She is reassessed and certification period extended.  She is demonstrating an improvement in hip flex and knee extension  strength but slight decrease in hip abd.  She reports no ankle pain.  Left ankle ROM improved.  She ambs to session today without an assistive device.  She continues to have functional deficits due to poor balance and gait dysfunction. She is a high fall risk.  She did amb to and from setting today with difficulty.  Encouragement to bring an AD is given. She will benefit form skilled PT intervention with initial sessions being aquatic based for added safety in environment allowing improvement in strength and gait/balance before transitioning to land. She will require use of lift but will progress to using stairs.    OBJECTIVE IMPAIRMENTS: Abnormal gait, decreased activity tolerance, decreased knowledge of use of DME, difficulty walking, decreased ROM, decreased strength, and pain.   ACTIVITY LIMITATIONS: carrying, lifting, bending, sitting, standing, squatting, stairs, transfers, and locomotion level  PARTICIPATION LIMITATIONS: meal prep, cleaning, laundry, driving, shopping, community activity, , and yard work  PERSONAL FACTORS: 3+ comorbidities: Osteoporosis, multilevel lumbar fracture 2020, 2022; frequent falls  are also affecting patient's functional outcome.   REHAB POTENTIAL: Good  CLINICAL DECISION MAKING: Evolving/moderate complexity declining general functional mobility  EVALUATION COMPLEXITY: Moderate   GOALS: Goals reviewed with patient? Yes  SHORT TERM GOALS: Target date: 07/17/23   Patient will increase gross bilateral lower extremity strength by 5 pounds. Baseline: Goal status: Met 06/18/23  2.  Patient will increase bilateral dorsiflexion motion by 5 degrees Baseline:   Goal status: achieved full 11/11  3.  Patient will stand for greater than 15 minutes without increased pain Baseline:  Goal status:progressing but not met 11/11  4.  Patient will be independent with basic exercise program Baseline:  Goal status: Has base exercise program 11/28  5. Pt will tolerate full aquatic sessions consistently without increase in pain and with improving function to demonstrate good toleration and effectiveness of intervention.  Baseline Goal status: Met 07/16/23    LONG TERM GOALS: Target date: 08/28/23    1. Patient will go up and down 8 steps with reciprocal gait pattern safely without increased pain Baseline:  Goal status: We will begin working on stair training 11/20  2.  Patient will ambulate community distances without increased pain Baseline:  Goal status: continues to have mild increase in pain 11/27  3.  Patient will have a complete land and pool program to promote strength and functional mobility Baseline:  Goal status: INITIAL  4. Pt will tolerate walking to and from setting and engaging in aquatic therapy session without excessive fatigue or increase in pain to demonstrate improved toleration to activity.  Baseline: unable at eval Goal Status: Has been able to participate in exercises achieved 11/27  PLAN:  PT FREQUENCY: 2x/week  PT DURATION: 8 weeks  PLANNED INTERVENTIONS: Therapeutic exercises, Therapeutic activity, Neuromuscular re-education, Balance training, Gait training, Patient/Family education, Self Care, Joint mobilization, Stair training, DME instructions, Aquatic Therapy, Dry Needling, Electrical stimulation, Cryotherapy, Moist heat, Taping, Manual therapy, and Re-evaluation.   PLAN FOR NEXT SESSION:  Continue to progress functional strengthening such as stairs squats and improved general endurance.

## 2023-10-28 ENCOUNTER — Ambulatory Visit
Admission: EM | Admit: 2023-10-28 | Discharge: 2023-10-28 | Disposition: A | Discharge status: Home / Self Care | Payer: BC Managed Care – PPO

## 2023-10-28 DIAGNOSIS — R296 Repeated falls: Secondary | ICD-10-CM

## 2023-10-28 DIAGNOSIS — S51012A Laceration without foreign body of left elbow, initial encounter: Secondary | ICD-10-CM

## 2023-10-28 DIAGNOSIS — Z23 Encounter for immunization: Secondary | ICD-10-CM

## 2023-10-28 MED ORDER — CEPHALEXIN 500 MG PO CAPS: 500.0000 mg | ORAL_CAPSULE | Freq: Four times a day (QID) | ORAL | 0 refills | Status: DC

## 2023-10-28 MED ORDER — TETANUS-DIPHTH-ACELL PERTUSSIS 5-2.5-18.5 LF-MCG/0.5 IM SUSY
0.5000 mL | PREFILLED_SYRINGE | Freq: Once | INTRAMUSCULAR | Status: AC
  Administered 2023-10-28: 0.5 mL via INTRAMUSCULAR

## 2023-10-28 NOTE — ED Provider Notes (Signed)
[facility]    CSN: [id number] Arrival date & time: 10/28/23  1010      History   Chief Complaint Chief Complaint  Patient presents with   Fall    HPI Rhonda Espinoza is a 63 y.o. female.  Accompanied by a friend, patient presents with a laceration on her left elbow that initially occurred on 10/24/2023 during a fall.  The wound reopened today when she fell again.  She states she fell at home as she was coming out of her bedroom.  She denies head injury or loss of consciousness.  She treated the bleeding wound on her elbow with a bandage.  She states her last tetanus was in 2016.  Patient was seen by her PCP on 10/26/2023 for traumatic head injury and left elbow laceration, along with follow-up of chronic medical problems; Negative head CT at that time.  The history is provided by the patient and medical records.    Past Medical History:  Diagnosis Date   ADD (attention deficit disorder)    Alcohol withdrawal seizure (HCC)    Alcohol-induced pancreatitis    Anxiety    Arthritis    Asthma    allergy induced per patient   Disc disorder    Hyperlipidemia    Hypertension    Pneumonia     Patient Active Problem List   Diagnosis Date Noted   Fatty liver, alcoholic 08/07/2022   Acute pancreatitis 08/06/2022   Acute alcoholic pancreatitis 05/29/2021   Seizures (HCC) 05/18/2021   Hypokalemia 05/18/2021   Acute encephalopathy 05/18/2021   Alcohol withdrawal seizure (HCC) 05/18/2021   ETOH abuse 05/18/2021   Surgery, elective 02/05/2021   History of T12 kyphoplasty 01/24/2019   Hip pain, acute, right 01/16/2019   Acute leg pain, right 01/16/2019   Sciatica of right side without back pain 01/16/2019   Latex precautions, history of latex allergy 11/02/2018   Lesion of right native kidney 08/30/2018   Abnormal MRI, lumbar spine (08/14/2018) 08/23/2018   Lumbar lateral recess stenosis (Left: L2-3) (Bilateral: L3-4) (Right: L4-5) 08/23/2018   Lumbar Grade 1  Retrolisthesis of L2/L3, L3/L4, & L4/L5 08/23/2018   Grade 1 Lumbar Anterolisthesis of L5/S1 08/23/2018   Lumbar facet hypertrophy (Multilevel) (Bilateral) 08/19/2018   Lumbar foraminal stenosis (Left: L2-3, L3-4) (Bilateral: L4-5, L5-S1) 08/19/2018   Lumbar central spinal stenosis 08/19/2018   DDD (degenerative disc disease), lumbar 08/05/2018   Traumatic compression fracture of T12 (<15%) thoracic vertebra, sequela 08/05/2018   Abnormal x-ray of lumbar spine (08/02/2018) 08/05/2018   Disorder of skeletal system 08/05/2018   Pharmacologic therapy 08/05/2018   Problems influencing health status 08/05/2018   Chronic pain syndrome 08/05/2018   Class 1 obesity with body mass index (BMI) of 33.0 to 33.9 in adult 05/24/2018   Seasonal allergic rhinitis due to pollen 02/03/2017   Chronic low back pain (Bilateral) (R>L) 08/04/2016   Long term current use of opiate analgesic 08/04/2016   Long term prescription opiate use 08/04/2016   Opiate use 08/04/2016   Spondylosis without myelopathy or radiculopathy, lumbar region 08/04/2016   ADD (attention deficit disorder) 05/22/2016   GAD (generalized anxiety disorder) 05/22/2016   Hypertension, essential 05/22/2016   Panic attacks 05/22/2016   Essential hypertension 08/30/2015   Back muscle spasm 01/31/2014   Abnormal LFTs 01/31/2014   Blood glucose elevated 01/31/2014   Insomnia 01/31/2014   Lumbar facet syndrome (Bilateral) (R>L) 01/31/2014   Elevated LFTs 01/31/2014   Avitaminosis D 07/03/2011   Vitamin D deficiency 07/03/2011  HLD (hyperlipidemia) 06/23/2011   Hypertriglyceridemia 06/23/2011    Past Surgical History:  Procedure Laterality Date   CESAREAN SECTION     CHOLECYSTECTOMY  2022   FOOT SURGERY Left    FRACTURE SURGERY Right 07/2020   R wrist    HERNIA REPAIR     KNEE ARTHROSCOPY     KYPHOPLASTY N/A 10/13/2018   Procedure: THORACIC 12 KYPHOPLASTY;  Surgeon: Estill Bamberg, MD;  Location: [facility] OR;  Service: Orthopedics;   Laterality: N/A;   KYPHOPLASTY N/A 02/05/2021   Procedure: LUMBAR ONE  AND LUMBAR FOUR KYPHOPLASTY;  Surgeon: Estill Bamberg, MD;  Location: [facility] OR;  Service: Orthopedics;  Laterality: N/A;    OB History   No obstetric history on file.      Home Medications    Prior to Admission medications   Medication Sig Start Date End Date Taking? Authorizing Provider  amLODipine (NORVASC) 10 MG tablet Take 1 tablet by mouth daily. 05/06/23  Yes [provider]  cephALEXin (KEFLEX) 500 MG capsule Take 1 capsule (500 mg total) by mouth 4 (four) times daily. 10/28/23  Yes Mickie Bail, NP  DULoxetine (CYMBALTA) 60 MG capsule Take by mouth. 08/31/23  Yes [provider]  Teriparatide 620 MCG/2.48ML SOPN INJECT 20 MCG UNDER THE SKIN 1 TIME A DAY. DISCARD PEN 28 DAYS AFTER INITIAL USE 03/12/23  Yes [provider]  acetaminophen (TYLENOL) 500 MG tablet Take 1,000 mg by mouth as needed for mild pain or headache.    [provider]  ALPRAZolam Prudy Feeler) 1 MG tablet Take 1 mg by mouth daily as needed for sleep or anxiety. 05/23/21   [provider]  aspirin EC 81 MG tablet Take 81 mg by mouth in the morning. Swallow whole.    [provider]  atorvastatin (LIPITOR) 40 MG tablet Take 40 mg by mouth daily.  06/12/16   [provider]  Cholecalciferol 1.25 MG (50000 UT) capsule Take 50,000 Units by mouth once a week. 07/28/22   [provider]  desvenlafaxine (PRISTIQ) 100 MG 24 hr tablet Take 100 mg by mouth at bedtime.    [provider]  fenofibrate 160 MG tablet Take 160 mg by mouth daily.    [provider]  fexofenadine (ALLEGRA) 180 MG tablet Take 180 mg by mouth daily as needed for allergies or rhinitis.    [provider]  folic acid (FOLVITE) 1 MG tablet Take 1 tablet (1 mg total) by mouth daily. Patient not taking: Reported on 08/07/2022 05/20/21   [name], MD  Multiple Vitamins-Minerals (MULTIVITAMIN  WITH MINERALS) tablet Take 1 tablet by mouth daily.    [provider]  omeprazole (PRILOSEC) 40 MG capsule Take 40 mg by mouth daily.    [provider]  ondansetron (ZOFRAN) 4 MG tablet Take 4 mg by mouth as needed for nausea or vomiting. 11/24/22   [provider]  QUEtiapine (SEROQUEL) 100 MG tablet Take 100 mg by mouth at bedtime. Patient not taking: Reported on 04/29/2023 01/06/23   [provider]  tiZANidine (ZANAFLEX) 4 MG tablet Take 4 mg by mouth as needed for muscle spasms. 04/26/20   [provider]  zolpidem (AMBIEN CR) 12.5 MG CR tablet Take 12.5 mg by mouth at bedtime. 07/16/16   [provider]    Family History Family History  Problem Relation Age of Onset   Pancreatitis Brother    Seizures Neg Hx     Social History Social History   Tobacco  Use   Smoking status: Never   Smokeless tobacco: Never  Vaping Use   Vaping status: Never Used  Substance Use Topics   Alcohol use: Yes    Comment: reports 2 glasses/day of vodka with club soda and grenadine   Drug use: Never     Allergies   Latex, Wound dressings, and Valsartan-hydrochlorothiazide   Review of Systems Review of Systems  Constitutional:  Negative for chills and fever.  Musculoskeletal:  Negative for arthralgias and joint swelling.  Skin:  Positive for wound. Negative for color change.  Neurological:  Negative for weakness and numbness.     Physical Exam Triage Vital Signs ED Triage Vitals [10/28/23 1127]  Encounter Vitals Group     BP 128/75     Systolic BP Percentile      Diastolic BP Percentile      Pulse Rate 62     Resp 18     Temp 97.7 F (36.5 C)     Temp src      SpO2 95 %     Weight      Height      Head Circumference      Peak Flow      Pain Score      Pain Loc      Pain Education      Exclude from Growth Chart    No data found.  Updated Vital Signs BP 128/75   Pulse 62   Temp 97.7 F (36.5 C)   Resp 18   LMP  06/30/2011   SpO2 95%   Visual Acuity Right Eye Distance:   Left Eye Distance:   Bilateral Distance:    Right Eye Near:   Left Eye Near:    Bilateral Near:     Physical Exam Constitutional:      General: She is not in acute distress. HENT:     Mouth/Throat:     Mouth: Mucous membranes are moist.  Cardiovascular:     Rate and Rhythm: Normal rate and regular rhythm.  Pulmonary:     Effort: Pulmonary effort is normal. No respiratory distress.  Musculoskeletal:        General: No swelling or deformity. Normal range of motion.  Skin:    General: Skin is warm and dry.     Capillary Refill: Capillary refill takes less than 2 seconds.     Findings: Lesion present.     Comments: 2 cm laceration on left elbow. Bleeding controlled.    Neurological:     General: No focal deficit present.     Mental Status: She is alert.     Sensory: No sensory deficit.     Motor: No weakness.      UC Treatments / Results  Labs (all labs ordered are listed, but only abnormal results are displayed) Labs Reviewed - No data to display  EKG   Radiology No results found.  Procedures Laceration Repair  Date/Time: 10/28/2023 12:20 PM  Performed by: Mickie Bail, NP Authorized by: Mickie Bail, NP   Consent:    Consent obtained:  Verbal   Consent given by:  Patient   Risks discussed:  Infection, pain, poor cosmetic result and poor wound healing Universal protocol:    Procedure explained and questions answered to patient or proxy's satisfaction: yes   Anesthesia:    Anesthesia method:  Local infiltration   Local anesthetic:  Lidocaine 1% w/o epi Laceration details:    Location:  Shoulder/arm   Shoulder/arm  location:  L elbow   Length (cm):  2   Depth (mm):  4 Pre-procedure details:    Preparation:  Patient was prepped and draped in usual sterile fashion Exploration:    Hemostasis achieved with:  Direct pressure   Imaging outcome: foreign body not noted     Wound exploration:  wound explored through full range of motion and entire depth of wound visualized   Treatment:    Area cleansed with:  Shur-Clens   Amount of cleaning:  Standard   Irrigation solution:  Sterile water   Irrigation method:  Syringe   Visualized foreign bodies/material removed: no   Skin repair:    Repair method:  Sutures   Suture size:  3-0   Suture material:  Nylon   Suture technique:  Simple interrupted   Number of sutures:  1 Approximation:    Approximation:  Loose Repair type:    Repair type:  Simple Post-procedure details:    Dressing:  Antibiotic ointment and non-adherent dressing   Procedure completion:  Tolerated well, no immediate complications  (including critical care time)  Medications Ordered in UC Medications  Tdap (BOOSTRIX) injection 0.5 mL (0.5 mLs Intramuscular Given 10/28/23 1200)    Initial Impression / Assessment and Plan / UC Course  I have reviewed the triage vital signs and the nursing notes.  Pertinent labs & imaging results that were available during my care of the patient were reviewed by me and considered in my medical decision making (see chart for details).    Laceration of left elbow, frequent falls.  1 loose suture applied to laceration on left elbow as the wound is gaping.  Tetanus updated.  Treating today with cephalexin.  Instructed patient to follow-up with her PCP for her frequent falls.  ED precautions given.  Education provided on falls prevention.  Instructed patient to have the suture removed in 7 to 10 days.  Wound care instructions and signs of infection discussed.  Instructed patient to follow-up right away if she notes signs of infection.  Education provided on laceration care.  She agrees to plan of care.  Final Clinical Impressions(s) / UC Diagnoses   Final diagnoses:  Laceration of left elbow, initial encounter  Frequent falls     Discharge Instructions      Your tetanus was updated today.    Take the cephalexin as directed.     Keep your wound clean and dry.  Wash it gently twice a day with soap and water.  Apply an antibiotic cream twice a day.    Your suture needs to be removed in 7 to 10 days.  Follow-up right away if you see signs of infection, such as increased pain, redness, pus-like drainage, warmth, fever, chills, or other concerning symptoms.    Follow-up with your primary care provider to discuss your frequent falls.         ED Prescriptions     Medication Sig Dispense Auth. Provider   cephALEXin (KEFLEX) 500 MG capsule Take 1 capsule (500 mg total) by mouth 4 (four) times daily. 28 capsule Mickie Bail, NP      PDMP not reviewed this encounter.   Mickie Bail, NP 10/28/23 [phone]

## 2023-10-28 NOTE — ED Triage Notes (Signed)
Patient to Urgent Care with complaints of a laceration present to her left sided elbow that occurred after a fall today. States she fell on over the weekend then fell again today and reopened the existing wound.   Bleeding controlled with a band-aid at this time.   TDAP: 08/20/2015

## 2023-10-28 NOTE — Discharge Instructions (Addendum)
Your tetanus was updated today.    Take the cephalexin as directed.    Keep your wound clean and dry.  Wash it gently twice a day with soap and water.  Apply an antibiotic cream twice a day.    Your suture needs to be removed in 7 to 10 days.  Follow-up right away if you see signs of infection, such as increased pain, redness, pus-like drainage, warmth, fever, chills, or other concerning symptoms.    Follow-up with your primary care provider to discuss your frequent falls.

## 2023-11-02 ENCOUNTER — Emergency Department (HOSPITAL_BASED_OUTPATIENT_CLINIC_OR_DEPARTMENT_OTHER)
Admission: EM | Admit: 2023-11-02 | Discharge: 2023-11-02 | Disposition: A | Discharge status: Home / Self Care | Payer: BC Managed Care – PPO | Attending: Emergency Medicine | Admitting: Emergency Medicine

## 2023-11-02 ENCOUNTER — Other Ambulatory Visit: Payer: Self-pay

## 2023-11-02 ENCOUNTER — Emergency Department (HOSPITAL_BASED_OUTPATIENT_CLINIC_OR_DEPARTMENT_OTHER): Payer: BC Managed Care – PPO

## 2023-11-02 ENCOUNTER — Emergency Department (HOSPITAL_BASED_OUTPATIENT_CLINIC_OR_DEPARTMENT_OTHER): Payer: BC Managed Care – PPO | Admitting: Radiology

## 2023-11-02 DIAGNOSIS — S92352A Displaced fracture of fifth metatarsal bone, left foot, initial encounter for closed fracture: Secondary | ICD-10-CM | POA: Diagnosis not present

## 2023-11-02 DIAGNOSIS — S99921A Unspecified injury of right foot, initial encounter: Secondary | ICD-10-CM | POA: Diagnosis present

## 2023-11-02 DIAGNOSIS — S92342A Displaced fracture of fourth metatarsal bone, left foot, initial encounter for closed fracture: Secondary | ICD-10-CM | POA: Diagnosis not present

## 2023-11-02 DIAGNOSIS — W1839XA Other fall on same level, initial encounter: Secondary | ICD-10-CM | POA: Insufficient documentation

## 2023-11-02 DIAGNOSIS — S92332A Displaced fracture of third metatarsal bone, left foot, initial encounter for closed fracture: Secondary | ICD-10-CM | POA: Diagnosis not present

## 2023-11-02 DIAGNOSIS — W19XXXA Unspecified fall, initial encounter: Secondary | ICD-10-CM

## 2023-11-02 DIAGNOSIS — S92909A Unspecified fracture of unspecified foot, initial encounter for closed fracture: Secondary | ICD-10-CM

## 2023-11-02 LAB — CBC
HCT: 33.4 % — ABNORMAL LOW (ref 36.0–46.0)
Hemoglobin: 11.2 g/dL — ABNORMAL LOW (ref 12.0–15.0)
MCH: 29.9 pg (ref 26.0–34.0)
MCHC: 33.5 g/dL (ref 30.0–36.0)
MCV: 89.3 fL (ref 80.0–100.0)
Platelets: 315 10*3/uL (ref 150–400)
RBC: 3.74 MIL/uL — ABNORMAL LOW (ref 3.87–5.11)
RDW: 19.4 % — ABNORMAL HIGH (ref 11.5–15.5)
WBC: 9.7 10*3/uL (ref 4.0–10.5)
nRBC: 0 % (ref 0.0–0.2)

## 2023-11-02 LAB — BASIC METABOLIC PANEL
Anion gap: 14 (ref 5–15)
BUN: 14 mg/dL (ref 8–23)
CO2: 23 mmol/L (ref 22–32)
Calcium: 8.9 mg/dL (ref 8.9–10.3)
Chloride: 97 mmol/L — ABNORMAL LOW (ref 98–111)
Creatinine, Ser: 0.98 mg/dL (ref 0.44–1.00)
GFR, Estimated: >60 mL/min (ref >60)
Glucose, Bld: 90 mg/dL (ref 70–99)
Potassium: 4.6 mmol/L (ref 3.5–5.1)
Sodium: 134 mmol/L — ABNORMAL LOW (ref 135–145)

## 2023-11-02 LAB — URINALYSIS, ROUTINE W REFLEX MICROSCOPIC
Bilirubin Urine: NEGATIVE
Glucose, UA: NEGATIVE mg/dL
Hgb urine dipstick: NEGATIVE
Ketones, ur: NEGATIVE mg/dL
Leukocytes,Ua: NEGATIVE
Nitrite: NEGATIVE
Protein, ur: NEGATIVE mg/dL
Specific Gravity, Urine: 1.01 (ref 1.005–1.030)
pH: 6 (ref 5.0–8.0)

## 2023-11-02 LAB — CBG MONITORING, ED: Glucose-Capillary: 96 mg/dL (ref 70–99)

## 2023-11-02 MED ORDER — BACITRACIN ZINC 500 UNIT/GM EX OINT: TOPICAL_OINTMENT | Freq: Two times a day (BID) | CUTANEOUS | Status: DC

## 2023-11-02 NOTE — ED Provider Triage Note (Signed)
Emergency Medicine Provider Triage Evaluation Note  Rhonda Espinoza , a 63 y.o. female  was evaluated in triage.  Pt complains of HA since last Sunday and multiple falls due to difficulty with balance.  Larey Seat three times last night due to chronic issues with balance. No loc but hit back of head  Larey Seat last week and was evaluated by [facility] ED. had CT which was neg on 1/27  Ongoing hallucinations and difficulty finding words, difficulty with balance prior to fall - 6-2mo.   Review of Systems  Positive: HA, multiple falls Negative: fevers  Physical Exam  BP (!) 101/46 (BP Location: Right Arm)   Pulse 72   Temp 97.9 F (36.6 C) (Oral)   Resp 18   LMP 06/30/2011   SpO2 94%  Gen:   Awake, no distress. A&Ox3   Resp:  Normal effort  MSK:   Moves extremities without difficulty  Other:    Medical Decision Making  Medically screening exam initiated at 2:38 PM.  Appropriate orders placed.  ANGELEA PENNY was informed that the remainder of the evaluation will be completed by another provider, this initial triage assessment does not replace that evaluation, and the importance of remaining in the ED until their evaluation is complete.  Workup started   Judithann Sheen, [place] 11/02/23 [phone]

## 2023-11-02 NOTE — Discharge Instructions (Signed)
Follow up with your family doc and ortho in the office.  Take 4 over the counter ibuprofen tablets 3 times a day or 2 over-the-counter naproxen tablets twice a day for pain. Also take tylenol 1000mg (2 extra strength) four times a day.

## 2023-11-02 NOTE — ED Notes (Addendum)
x2 CAM Boots applied... Confirmed with provider for x2 CAM boots.

## 2023-11-02 NOTE — ED Provider Notes (Signed)
[place] EMERGENCY DEPARTMENT AT [facility] Provider Note   CSN: [id number] Arrival date & time: 11/02/23  1105     History  No chief complaint on file.   Rhonda Espinoza is a 63 y.o. female.  63 yo F with a chief complaints of frequent falls.  The patient has been struggling with her cognition going on for a few months now.  This has been getting progressively worse.  She had a brother who reportedly had something similar happened and was diagnosed with dementia.  She is struck her head a couple times recently with falling.  She also had hurt her left elbow and required a stitch at an orthopedic office a few days ago.  Complaining of bilateral foot pain as well.        Home Medications Prior to Admission medications   Medication Sig Start Date End Date Taking? Authorizing Provider  acetaminophen (TYLENOL) 500 MG tablet Take 1,000 mg by mouth as needed for mild pain or headache.    [provider]  ALPRAZolam Prudy Feeler) 1 MG tablet Take 1 mg by mouth daily as needed for sleep or anxiety. 05/23/21   [provider]  amLODipine (NORVASC) 10 MG tablet Take 1 tablet by mouth daily. 05/06/23   [provider]  aspirin EC 81 MG tablet Take 81 mg by mouth in the morning. Swallow whole.    [provider]  atorvastatin (LIPITOR) 40 MG tablet Take 40 mg by mouth daily.  06/12/16   [provider]  cephALEXin (KEFLEX) 500 MG capsule Take 1 capsule (500 mg total) by mouth 4 (four) times daily. 10/28/23   Mickie Bail, NP  Cholecalciferol 1.25 MG (50000 UT) capsule Take 50,000 Units by mouth once a week. 07/28/22   [provider]  desvenlafaxine (PRISTIQ) 100 MG 24 hr tablet Take 100 mg by mouth at bedtime.    [provider]  DULoxetine (CYMBALTA) 60 MG capsule Take by mouth. 08/31/23   [provider]  fenofibrate 160 MG tablet Take 160 mg by mouth daily.    [provider]  fexofenadine (ALLEGRA) 180 MG  tablet Take 180 mg by mouth daily as needed for allergies or rhinitis.    [provider]  folic acid (FOLVITE) 1 MG tablet Take 1 tablet (1 mg total) by mouth daily. Patient not taking: Reported on 08/07/2022 05/20/21   [name], MD  Multiple Vitamins-Minerals (MULTIVITAMIN WITH MINERALS) tablet Take 1 tablet by mouth daily.    [provider]  omeprazole (PRILOSEC) 40 MG capsule Take 40 mg by mouth daily.    [provider]  ondansetron (ZOFRAN) 4 MG tablet Take 4 mg by mouth as needed for nausea or vomiting. 11/24/22   [provider]  QUEtiapine (SEROQUEL) 100 MG tablet Take 100 mg by mouth at bedtime. Patient not taking: Reported on 04/29/2023 01/06/23   [provider]  Teriparatide 620 MCG/2.48ML SOPN INJECT 20 MCG UNDER THE SKIN 1 TIME A DAY. DISCARD PEN 28 DAYS AFTER INITIAL USE 03/12/23   [provider]  tiZANidine (ZANAFLEX) 4 MG tablet Take 4 mg by mouth as needed for muscle spasms. 04/26/20   [provider]  zolpidem (AMBIEN CR) 12.5 MG CR tablet Take 12.5 mg by mouth at bedtime. 07/16/16   [provider]      Allergies    Latex, Wound dressings, and Valsartan-hydrochlorothiazide    Review of Systems   Review of Systems  Physical Exam Updated Vital  Signs BP (!) 170/100   Pulse 71   Temp 97.6 F (36.4 C) (Oral) Comment: Pt drank water, prior to temp  Resp 15   LMP 06/30/2011   SpO2 96%  Physical Exam Vitals and nursing note reviewed.  Constitutional:      General: She is not in acute distress.    Appearance: She is well-developed. She is not diaphoretic.  HENT:     Head: Normocephalic and atraumatic.  Eyes:     Pupils: Pupils are equal, round, and reactive to light.  Cardiovascular:     Rate and Rhythm: Normal rate and regular rhythm.     Heart sounds: No murmur heard.    No friction rub. No gallop.  Pulmonary:     Effort: Pulmonary effort is normal.     Breath sounds: No wheezing or  rales.  Abdominal:     General: There is no distension.     Palpations: Abdomen is soft.     Tenderness: There is no abdominal tenderness.  Musculoskeletal:        General: No tenderness.     Cervical back: Normal range of motion and neck supple.     Comments: Deformity to the bilateral feet.  Multiple surgical scars.  Edema and wound to the olecranon process.  Stitch in place.  Slightly loose slightly gaping.  able to range the elbow and supinate and pronate the forearm.  Skin:    General: Skin is warm and dry.  Neurological:     Mental Status: She is alert and oriented to person, place, and time.  Psychiatric:        Behavior: Behavior normal.     ED Results / Procedures / Treatments   Labs (all labs ordered are listed, but only abnormal results are displayed) Labs Reviewed  BASIC METABOLIC PANEL - Abnormal; Notable for the following components:      Result Value   Sodium 134 (*)    Chloride 97 (*)    All other components within normal limits  CBC - Abnormal; Notable for the following components:   RBC 3.74 (*)    Hemoglobin 11.2 (*)    HCT 33.4 (*)    RDW 19.4 (*)    All other components within normal limits  URINALYSIS, ROUTINE W REFLEX MICROSCOPIC  CBG MONITORING, ED    EKG EKG Interpretation Date/Time:  Monday November 02 2023 12:39:08 EST Ventricular Rate:  71 PR Interval:    QRS Duration:  98 QT Interval:  418 QTC Calculation: 454 R Axis:   63  Text Interpretation: Normal sinus rhythm Incomplete right bundle branch block Nonspecific ST and T wave abnormality Abnormal ECG No significant change since last tracing Confirmed by Melene Plan [phone]) on 11/02/2023 3:42:50 PM  Radiology DG Elbow Complete Left Result Date: 11/02/2023 CLINICAL DATA:  Left elbow pain.  Multiple falls. EXAM: LEFT ELBOW - COMPLETE 3+ VIEW COMPARISON:  None Available. FINDINGS: There is diffuse decreased bone mineralization. There is lucency and irregularity indicating a laceration  posterior to the proximal ulnar diaphysis. Mild associated soft tissue swelling. No elbow joint effusion is seen. No acute fracture or dislocation. Joint spaces are preserved. IMPRESSION: Laceration posterior to the proximal ulnar diaphysis. No acute fracture. Electronically Signed   By: Neita Garnet M.D.   On: 11/02/2023 18:38   DG Foot Complete Left Result Date: 11/02/2023 CLINICAL DATA:  Foot pain.  Frequent falls. EXAM: LEFT FOOT - COMPLETE 3+ VIEW COMPARISON:  None Available. FINDINGS: There is diffuse decreased  bone mineralization. Moderate hallux valgus. Two screws traverse the first tarsometatarsal joint with this incompletely fused. Old healed fracture of the distal second metatarsal shaft. A screw overlies the second metatarsal head. ALS screw traverses the second toe PIP joint which is fused. There is 2 mm lateral cortical step-off of the third metatarsal neck. 1 mm lateral cortical step-off at the fourth and fifth metatarsal necks. These appear to be acute to subacute fractures. Status post instrumented ORIF of the distal tibia and fibula. IMPRESSION: 1. Acute to subacute minimally displaced fractures of the third, fourth, and fifth metatarsal necks. 2. Moderate hallux valgus. 3. Status post instrumented arthrodesis of the first tarsometatarsal joint and second toe PIP joint. Electronically Signed   By: Neita Garnet M.D.   On: 11/02/2023 18:36   DG Foot Complete Right Result Date: 11/02/2023 CLINICAL DATA:  Right foot pain.  Frequent falls. EXAM: RIGHT FOOT COMPLETE - 3+ VIEW COMPARISON:  None available FINDINGS: There is diffuse decreased bone mineralization. Postsurgical changes of dorsal plate and screw great toe metatarsophalangeal arthrodesis with complete osseous fusion. Additional postsurgical changes of osteotomy of the medial cuneiform fixated by dorsal surgical staple. Surgical staple and two screws traversing the talonavicular joint. Two screws fixating the posterior subtalar joint.  Headless screw traversing the second PIP joint. Small screw within the second metatarsal head. There is a subacute transverse fracture of the fifth metatarsal neck with moderate peripheral healing sclerosis and mild persistent fracture line lucency. Minimal 2 mm dorsal displacement of the distal fracture component with respect to the proximal fracture component. Moderate to severe medial first tarsometatarsal joint space narrowing. IMPRESSION: 1. Subacute transverse fracture of the fifth metatarsal neck with moderate peripheral healing sclerosis and mild persistent fracture line lucency. 2. Postsurgical changes of great toe metatarsophalangeal arthrodesis with complete osseous fusion. 3. Postsurgical changes of osteotomy of the medial cuneiform. 4. Postsurgical changes of talonavicular and posterior subtalar arthrodesis. Electronically Signed   By: Neita Garnet M.D.   On: 11/02/2023 18:34   CT Head Wo Contrast Result Date: 11/02/2023 CLINICAL DATA:  Multiple episodes of hallucination and dizziness, frequent falls EXAM: CT HEAD WITHOUT CONTRAST CT CERVICAL SPINE WITHOUT CONTRAST TECHNIQUE: Multidetector CT imaging of the head and cervical spine was performed following the standard protocol without intravenous contrast. Multiplanar CT image reconstructions of the cervical spine were also generated. RADIATION DOSE REDUCTION: This exam was performed according to the departmental dose-optimization program which includes automated exposure control, adjustment of the mA and/or kV according to patient size and/or use of iterative reconstruction technique. COMPARISON:  05/05/2022 CT head, no prior CT cervical spine available FINDINGS: CT HEAD FINDINGS Brain: No evidence of acute infarct, hemorrhage, mass, mass effect, or midline shift. No hydrocephalus or extra-axial fluid collection. Vascular: No hyperdense vessel. Atherosclerotic calcifications in the intracranial carotid and vertebral arteries. Skull: Negative for  fracture or focal lesion. Sinuses/Orbits: Mild mucosal thickening in the right anterior ethmoid air cells. Osseous thickening of the right maxillary sinus walls, which likely indicates chronic right maxillary sinusitis. No acute finding in the orbits. Other: The mastoid air cells are well aerated. CT CERVICAL SPINE FINDINGS Alignment: No traumatic listhesis. Anterolisthesis of C3 on C4 and C4 on C5 appears facet mediated. Skull base and vertebrae: No acute fracture. 7 mm sclerotic lesion in the left aspect of T1 (series 5, image [id number] and series 5, image 38) Soft tissues and spinal canal: No prevertebral fluid or swelling. No visible canal hematoma. Disc levels: Degenerative changes in the cervical spine.No  high-grade spinal canal stenosis. Upper chest: No focal pulmonary opacity or pleural effusion. IMPRESSION: 1. No acute intracranial process. 2. No acute fracture or traumatic listhesis in the cervical spine. 3. 7 mm sclerotic lesion in the left aspect of T1, which is nonspecific but is somewhat concerning for metastatic disease. If there is clinical concern, a bone scan could be performed for further evaluation. Electronically Signed   By: Wiliam Ke M.D.   On: 11/02/2023 17:22   CT Cervical Spine Wo Contrast Result Date: 11/02/2023 CLINICAL DATA:  Multiple episodes of hallucination and dizziness, frequent falls EXAM: CT HEAD WITHOUT CONTRAST CT CERVICAL SPINE WITHOUT CONTRAST TECHNIQUE: Multidetector CT imaging of the head and cervical spine was performed following the standard protocol without intravenous contrast. Multiplanar CT image reconstructions of the cervical spine were also generated. RADIATION DOSE REDUCTION: This exam was performed according to the departmental dose-optimization program which includes automated exposure control, adjustment of the mA and/or kV according to patient size and/or use of iterative reconstruction technique. COMPARISON:  05/05/2022 CT head, no prior CT cervical spine  available FINDINGS: CT HEAD FINDINGS Brain: No evidence of acute infarct, hemorrhage, mass, mass effect, or midline shift. No hydrocephalus or extra-axial fluid collection. Vascular: No hyperdense vessel. Atherosclerotic calcifications in the intracranial carotid and vertebral arteries. Skull: Negative for fracture or focal lesion. Sinuses/Orbits: Mild mucosal thickening in the right anterior ethmoid air cells. Osseous thickening of the right maxillary sinus walls, which likely indicates chronic right maxillary sinusitis. No acute finding in the orbits. Other: The mastoid air cells are well aerated. CT CERVICAL SPINE FINDINGS Alignment: No traumatic listhesis. Anterolisthesis of C3 on C4 and C4 on C5 appears facet mediated. Skull base and vertebrae: No acute fracture. 7 mm sclerotic lesion in the left aspect of T1 (series 5, image [id number] and series 5, image 38) Soft tissues and spinal canal: No prevertebral fluid or swelling. No visible canal hematoma. Disc levels: Degenerative changes in the cervical spine.No high-grade spinal canal stenosis. Upper chest: No focal pulmonary opacity or pleural effusion. IMPRESSION: 1. No acute intracranial process. 2. No acute fracture or traumatic listhesis in the cervical spine. 3. 7 mm sclerotic lesion in the left aspect of T1, which is nonspecific but is somewhat concerning for metastatic disease. If there is clinical concern, a bone scan could be performed for further evaluation. Electronically Signed   By: Wiliam Ke M.D.   On: 11/02/2023 17:22    Procedures Procedures    Medications Ordered in ED Medications  bacitracin ointment (has no administration in time range)    ED Course/ Medical Decision Making/ A&P Clinical Course as of 11/02/23 1904  Mon Nov 02, 2023  1257 Glucose-Capillary: 96 [AS]    Clinical Course User Index [AS] Lula Olszewski Edsel Petrin, PA-C                                 Medical Decision Making Amount and/or Complexity of Data  Reviewed Labs: ordered. Radiology: ordered.  Risk OTC drugs.   63 yo F with a chief complaints of difficulties with cognition and frequent falls.  This has been going on for months.  She has seen her family doctor for this.  And struck her head today on a fall and came here for evaluation.  Complaining of pain to bilateral feet and left elbow.  CT of the head without obvious intracranial hemorrhage on my independent to rotation.  CT of the  C-spine without obvious acute fracture.  Radiology read with possible abnormality at C7.  Discussed with family to mention to PCP for outpatient imaging.  Lab work with hyponatremia hypochloremia likely secondary to dehydration.  No acute anemia no leukocytosis.  Plain film of bilateral feet with possible subacute foot fracture.  With her having worsening pain from baseline will place in cam boots bilaterally.  Plain film of the left elbow independently interpreted by me without acute fracture or dislocation.  Orthopedic follow-up.   7:04 PM:  I have discussed the diagnosis/risks/treatment options with the patient and family.  Evaluation and diagnostic testing in the emergency department does not suggest an emergent condition requiring admission or immediate intervention beyond what has been performed at this time.  They will follow up with PCP. We also discussed returning to the ED immediately if new or worsening sx occur. We discussed the sx which are most concerning (e.g., sudden worsening pain, fever, inability to tolerate by mouth) that necessitate immediate return. Medications administered to the patient during their visit and any new prescriptions provided to the patient are listed below.  Medications given during this visit Medications  bacitracin ointment (has no administration in time range)     The patient appears reasonably screen and/or stabilized for discharge and I doubt any other medical condition or other [facility] requiring further screening,  evaluation, or treatment in the ED at this time prior to discharge.          Final Clinical Impression(s) / ED Diagnoses Final diagnoses:  Fall, initial encounter  Multiple closed fractures of foot, initial encounter    Rx / DC Orders ED Discharge Orders     None         Melene Plan, DO 11/02/23 1904

## 2023-11-02 NOTE — ED Triage Notes (Addendum)
Pt caox4 with family reporting she has had multiple episodes of dizziness and hallucinations with frequent falls which has been going on "for a couple months". Pt reports 3 falls last night d/t dizziness.

## 2023-11-02 NOTE — ED Notes (Signed)
 Discharge paperwork given and verbally understood.

## 2023-11-05 ENCOUNTER — Inpatient Hospital Stay: Payer: BC Managed Care – PPO

## 2023-11-05 ENCOUNTER — Other Ambulatory Visit: Payer: Self-pay

## 2023-11-05 ENCOUNTER — Inpatient Hospital Stay
Admission: EM | Admit: 2023-11-05 | Discharge: 2023-11-08 | DRG: 439 | Disposition: A | Discharge status: Home / Self Care | Payer: BC Managed Care – PPO | Attending: Internal Medicine | Admitting: Internal Medicine

## 2023-11-05 ENCOUNTER — Encounter: Payer: Self-pay | Admitting: Intensive Care

## 2023-11-05 DIAGNOSIS — R7989 Other specified abnormal findings of blood chemistry: Secondary | ICD-10-CM | POA: Diagnosis present

## 2023-11-05 DIAGNOSIS — F410 Panic disorder [episodic paroxysmal anxiety] without agoraphobia: Secondary | ICD-10-CM | POA: Diagnosis present

## 2023-11-05 DIAGNOSIS — K219 Gastro-esophageal reflux disease without esophagitis: Secondary | ICD-10-CM | POA: Diagnosis present

## 2023-11-05 DIAGNOSIS — I2489 Other forms of acute ischemic heart disease: Secondary | ICD-10-CM | POA: Diagnosis present

## 2023-11-05 DIAGNOSIS — Z79899 Other long term (current) drug therapy: Secondary | ICD-10-CM | POA: Diagnosis not present

## 2023-11-05 DIAGNOSIS — R55 Syncope and collapse: Secondary | ICD-10-CM | POA: Insufficient documentation

## 2023-11-05 DIAGNOSIS — F419 Anxiety disorder, unspecified: Secondary | ICD-10-CM | POA: Insufficient documentation

## 2023-11-05 DIAGNOSIS — K858 Other acute pancreatitis without necrosis or infection: Secondary | ICD-10-CM | POA: Diagnosis not present

## 2023-11-05 DIAGNOSIS — I10 Essential (primary) hypertension: Secondary | ICD-10-CM | POA: Diagnosis present

## 2023-11-05 DIAGNOSIS — E6689 Other obesity not elsewhere classified: Secondary | ICD-10-CM | POA: Diagnosis present

## 2023-11-05 DIAGNOSIS — Z1152 Encounter for screening for COVID-19: Secondary | ICD-10-CM

## 2023-11-05 DIAGNOSIS — R531 Weakness: Secondary | ICD-10-CM | POA: Diagnosis present

## 2023-11-05 DIAGNOSIS — K852 Alcohol induced acute pancreatitis without necrosis or infection: Secondary | ICD-10-CM | POA: Diagnosis present

## 2023-11-05 DIAGNOSIS — E65 Localized adiposity: Secondary | ICD-10-CM | POA: Insufficient documentation

## 2023-11-05 DIAGNOSIS — F1010 Alcohol abuse, uncomplicated: Secondary | ICD-10-CM | POA: Diagnosis present

## 2023-11-05 DIAGNOSIS — K859 Acute pancreatitis without necrosis or infection, unspecified: Secondary | ICD-10-CM | POA: Diagnosis present

## 2023-11-05 DIAGNOSIS — G4700 Insomnia, unspecified: Secondary | ICD-10-CM | POA: Diagnosis present

## 2023-11-05 DIAGNOSIS — F418 Other specified anxiety disorders: Secondary | ICD-10-CM | POA: Diagnosis present

## 2023-11-05 DIAGNOSIS — G894 Chronic pain syndrome: Secondary | ICD-10-CM | POA: Diagnosis present

## 2023-11-05 DIAGNOSIS — E781 Pure hyperglyceridemia: Secondary | ICD-10-CM | POA: Diagnosis present

## 2023-11-05 DIAGNOSIS — Z6831 Body mass index (BMI) 31.0-31.9, adult: Secondary | ICD-10-CM

## 2023-11-05 DIAGNOSIS — E785 Hyperlipidemia, unspecified: Secondary | ICD-10-CM | POA: Diagnosis present

## 2023-11-05 DIAGNOSIS — S51012A Laceration without foreign body of left elbow, initial encounter: Secondary | ICD-10-CM | POA: Diagnosis present

## 2023-11-05 DIAGNOSIS — R569 Unspecified convulsions: Secondary | ICD-10-CM

## 2023-11-05 DIAGNOSIS — R197 Diarrhea, unspecified: Secondary | ICD-10-CM | POA: Insufficient documentation

## 2023-11-05 LAB — URINALYSIS, ROUTINE W REFLEX MICROSCOPIC
Bilirubin Urine: NEGATIVE
Glucose, UA: NEGATIVE mg/dL
Hgb urine dipstick: NEGATIVE
Ketones, ur: 20 mg/dL — AB
Leukocytes,Ua: NEGATIVE
Nitrite: NEGATIVE
Protein, ur: NEGATIVE mg/dL
Specific Gravity, Urine: 1.014 (ref 1.005–1.030)
pH: 5 (ref 5.0–8.0)

## 2023-11-05 LAB — COMPREHENSIVE METABOLIC PANEL
ALT: 28 U/L (ref 0–44)
AST: 27 U/L (ref 15–41)
Albumin: 4.3 g/dL (ref 3.5–5.0)
Alkaline Phosphatase: 142 U/L — ABNORMAL HIGH (ref 38–126)
Anion gap: 24 — ABNORMAL HIGH (ref 5–15)
BUN: 23 mg/dL (ref 8–23)
CO2: 19 mmol/L — ABNORMAL LOW (ref 22–32)
Calcium: 9.5 mg/dL (ref 8.9–10.3)
Chloride: 96 mmol/L — ABNORMAL LOW (ref 98–111)
Creatinine, Ser: 1.09 mg/dL — ABNORMAL HIGH (ref 0.44–1.00)
GFR, Estimated: 58 mL/min — ABNORMAL LOW (ref >60)
Glucose, Bld: 135 mg/dL — ABNORMAL HIGH (ref 70–99)
Potassium: 4.2 mmol/L (ref 3.5–5.1)
Sodium: 139 mmol/L (ref 135–145)
Total Bilirubin: 0.5 mg/dL (ref 0.0–1.2)
Total Protein: 8.6 g/dL — ABNORMAL HIGH (ref 6.5–8.1)

## 2023-11-05 LAB — CBC
HCT: 36.8 % (ref 36.0–46.0)
Hemoglobin: 12.3 g/dL (ref 12.0–15.0)
MCH: 30.2 pg (ref 26.0–34.0)
MCHC: 33.4 g/dL (ref 30.0–36.0)
MCV: 90.4 fL (ref 80.0–100.0)
Platelets: 345 10*3/uL (ref 150–400)
RBC: 4.07 MIL/uL (ref 3.87–5.11)
RDW: 19.9 % — ABNORMAL HIGH (ref 11.5–15.5)
WBC: 8.6 10*3/uL (ref 4.0–10.5)
nRBC: 0 % (ref 0.0–0.2)

## 2023-11-05 LAB — RESP PANEL BY RT-PCR (RSV, FLU A&B, COVID)  RVPGX2
Influenza A by PCR: NEGATIVE
Influenza B by PCR: NEGATIVE
Resp Syncytial Virus by PCR: NEGATIVE
SARS Coronavirus 2 by RT PCR: NEGATIVE

## 2023-11-05 LAB — ETHANOL: Alcohol, Ethyl (B): <10 mg/dL (ref <10)

## 2023-11-05 LAB — TRIGLYCERIDES: Triglycerides: 109 mg/dL (ref <150)

## 2023-11-05 LAB — PROCALCITONIN: Procalcitonin: <0.1 ng/mL

## 2023-11-05 LAB — TROPONIN I (HIGH SENSITIVITY)
Troponin I (High Sensitivity): 60 ng/L — ABNORMAL HIGH (ref <18)
Troponin I (High Sensitivity): 86 ng/L — ABNORMAL HIGH (ref <18)

## 2023-11-05 LAB — LIPASE, BLOOD: Lipase: 270 U/L — ABNORMAL HIGH (ref 11–51)

## 2023-11-05 MED ORDER — AMLODIPINE BESYLATE 10 MG PO TABS
10.0000 mg | ORAL_TABLET | Freq: Every day | ORAL | Status: DC
  Administered 2023-11-05 – 2023-11-08 (×4): 10 mg via ORAL
  Filled 2023-11-05: qty 2
  Filled 2023-11-05 (×3): qty 1

## 2023-11-05 MED ORDER — ONDANSETRON HCL 4 MG/2ML IJ SOLN
4.0000 mg | Freq: Four times a day (QID) | INTRAMUSCULAR | Status: DC | PRN
  Administered 2023-11-05 – 2023-11-08 (×7): 4 mg via INTRAVENOUS
  Filled 2023-11-05 (×7): qty 2

## 2023-11-05 MED ORDER — HEPARIN SODIUM (PORCINE) 5000 UNIT/ML IJ SOLN
5000.0000 [IU] | Freq: Three times a day (TID) | INTRAMUSCULAR | Status: DC
  Administered 2023-11-05 – 2023-11-08 (×8): 5000 [IU] via SUBCUTANEOUS
  Filled 2023-11-05 (×8): qty 1

## 2023-11-05 MED ORDER — ASPIRIN 81 MG PO TBEC
81.0000 mg | DELAYED_RELEASE_TABLET | Freq: Every morning | ORAL | Status: DC
  Administered 2023-11-06 – 2023-11-08 (×3): 81 mg via ORAL
  Filled 2023-11-05 (×3): qty 1

## 2023-11-05 MED ORDER — FENTANYL CITRATE PF 50 MCG/ML IJ SOSY
25.0000 ug | PREFILLED_SYRINGE | INTRAMUSCULAR | Status: DC | PRN
  Administered 2023-11-05: 25 ug via INTRAVENOUS
  Filled 2023-11-05: qty 1

## 2023-11-05 MED ORDER — ONDANSETRON HCL 4 MG/2ML IJ SOLN
4.0000 mg | Freq: Once | INTRAMUSCULAR | Status: AC
  Administered 2023-11-05: 4 mg via INTRAVENOUS
  Filled 2023-11-05: qty 2

## 2023-11-05 MED ORDER — HYDRALAZINE HCL 20 MG/ML IJ SOLN
5.0000 mg | Freq: Four times a day (QID) | INTRAMUSCULAR | Status: DC | PRN
  Administered 2023-11-05 – 2023-11-07 (×4): 5 mg via INTRAVENOUS
  Filled 2023-11-05 (×4): qty 1

## 2023-11-05 MED ORDER — ONDANSETRON 4 MG PO TBDP
4.0000 mg | ORAL_TABLET | Freq: Once | ORAL | Status: AC | PRN
  Administered 2023-11-05: 4 mg via ORAL
  Filled 2023-11-05: qty 1

## 2023-11-05 MED ORDER — ATORVASTATIN CALCIUM 20 MG PO TABS: 40.0000 mg | ORAL_TABLET | Freq: Every day | ORAL | Status: DC

## 2023-11-05 MED ORDER — LORAZEPAM 2 MG/ML IJ SOLN: 0.5000 mg | Freq: Four times a day (QID) | INTRAMUSCULAR | Status: DC | PRN

## 2023-11-05 MED ORDER — LORAZEPAM 2 MG/ML IJ SOLN: 2.0000 mg | INTRAMUSCULAR | Status: DC | PRN

## 2023-11-05 MED ORDER — ACETAMINOPHEN 650 MG RE SUPP: 650.0000 mg | Freq: Four times a day (QID) | RECTAL | Status: DC | PRN

## 2023-11-05 MED ORDER — ATORVASTATIN CALCIUM 20 MG PO TABS
40.0000 mg | ORAL_TABLET | Freq: Every day | ORAL | Status: DC
  Administered 2023-11-06 – 2023-11-08 (×3): 40 mg via ORAL
  Filled 2023-11-05 (×3): qty 2

## 2023-11-05 MED ORDER — HYDROMORPHONE HCL 1 MG/ML IJ SOLN
0.5000 mg | Freq: Once | INTRAMUSCULAR | Status: AC
  Administered 2023-11-05: 0.5 mg via INTRAVENOUS
  Filled 2023-11-05: qty 0.5

## 2023-11-05 MED ORDER — SODIUM CHLORIDE 0.9 % IV SOLN
1.0000 g | INTRAVENOUS | Status: DC
  Administered 2023-11-05 – 2023-11-07 (×3): 1 g via INTRAVENOUS
  Filled 2023-11-05 (×4): qty 10

## 2023-11-05 MED ORDER — ZOLPIDEM TARTRATE 5 MG PO TABS
5.0000 mg | ORAL_TABLET | Freq: Every evening | ORAL | Status: DC | PRN
  Administered 2023-11-05: 5 mg via ORAL
  Filled 2023-11-05: qty 1

## 2023-11-05 MED ORDER — SODIUM CHLORIDE 0.9 % IV SOLN
12.5000 mg | Freq: Four times a day (QID) | INTRAVENOUS | Status: AC | PRN
  Administered 2023-11-05 – 2023-11-06 (×2): 12.5 mg via INTRAVENOUS
  Filled 2023-11-05: qty 12.5
  Filled 2023-11-05 (×2): qty 0.5

## 2023-11-05 MED ORDER — PANTOPRAZOLE SODIUM 40 MG PO TBEC
80.0000 mg | DELAYED_RELEASE_TABLET | Freq: Every day | ORAL | Status: DC
  Administered 2023-11-06 – 2023-11-08 (×3): 80 mg via ORAL
  Filled 2023-11-05 (×3): qty 2

## 2023-11-05 MED ORDER — HYDROMORPHONE HCL 1 MG/ML IJ SOLN
0.5000 mg | INTRAMUSCULAR | Status: DC | PRN
  Administered 2023-11-05: 0.5 mg via INTRAVENOUS
  Filled 2023-11-05: qty 0.5

## 2023-11-05 MED ORDER — ONDANSETRON HCL 4 MG PO TABS
4.0000 mg | ORAL_TABLET | Freq: Four times a day (QID) | ORAL | Status: DC | PRN
  Filled 2023-11-05: qty 1

## 2023-11-05 MED ORDER — SODIUM CHLORIDE 0.9 % IV BOLUS
1000.0000 mL | Freq: Once | INTRAVENOUS | Status: AC
  Administered 2023-11-05: 1000 mL via INTRAVENOUS

## 2023-11-05 MED ORDER — LORAZEPAM 2 MG/ML IJ SOLN
0.5000 mg | Freq: Four times a day (QID) | INTRAMUSCULAR | Status: AC | PRN
  Administered 2023-11-06: 0.5 mg via INTRAVENOUS
  Filled 2023-11-05: qty 1

## 2023-11-05 MED ORDER — MORPHINE SULFATE (PF) 4 MG/ML IV SOLN
4.0000 mg | INTRAVENOUS | Status: AC | PRN
  Administered 2023-11-05 – 2023-11-06 (×2): 4 mg via INTRAVENOUS
  Filled 2023-11-05 (×2): qty 1

## 2023-11-05 MED ORDER — DULOXETINE HCL 30 MG PO CPEP
60.0000 mg | ORAL_CAPSULE | Freq: Every day | ORAL | Status: DC
Start: 2023-11-06 — End: 2023-11-08
  Administered 2023-11-06 – 2023-11-08 (×3): 60 mg via ORAL
  Filled 2023-11-05 (×3): qty 2

## 2023-11-05 MED ORDER — LACTATED RINGERS IV SOLN: INTRAVENOUS | Status: AC

## 2023-11-05 MED ORDER — ACETAMINOPHEN 325 MG PO TABS: 650.0000 mg | ORAL_TABLET | Freq: Four times a day (QID) | ORAL | Status: DC | PRN

## 2023-11-05 MED ORDER — SENNOSIDES-DOCUSATE SODIUM 8.6-50 MG PO TABS: 1.0000 | ORAL_TABLET | Freq: Every evening | ORAL | Status: DC | PRN

## 2023-11-05 NOTE — Hospital Course (Signed)
 Rhonda Espinoza is a 63 year old female with history of hypertension, depression, GERD, hyperlipidemia, hypertriglyceridemia, anxiety, who presents emergency department for chief concerns of nausea, vomiting, fever, diarrhea for 2 days.  Vitals in the ED showed temperature of 98.2, respiration 20, heart rate 102, blood pressure 188/116, SpO2 of 99% on room air.  Serum sodium is 139, potassium 4.2, chloride 96, bicarb 19, BUN of 23, serum creatinine 1.09, EGFR 58, nonfasting blood glucose 135, WBC 8.6, hemoglobin 12.3, platelets of 345.  UA was negative for leukocytes, nitrates, blood.  Lipase was elevated at 270.  Alk phos was elevated at 142.  Anion gap was 24.  ED treatment: Dilaudid  0.5 mg IV one-time dose, ondansetron  4 mg IV one-time dose, sodium chloride  1 L bolus.

## 2023-11-05 NOTE — Assessment & Plan Note (Signed)
 Ativan  2 mg as needed, 2 doses ordered for seizure with instructions to administer as appropriate and then let provider know.

## 2023-11-05 NOTE — Assessment & Plan Note (Addendum)
 Home amlodipine  10 mg daily resumed Hydralazine  5 mg IV every 6 hours as needed for SBP greater than 170, 5 days ordered

## 2023-11-05 NOTE — ED Notes (Signed)
 I have asked lab to draw her admission labs due to her small iv access and concern over collapsing the vein.

## 2023-11-05 NOTE — Assessment & Plan Note (Signed)
 Counseled patient on safe and slow weight loss

## 2023-11-05 NOTE — Assessment & Plan Note (Addendum)
 Check EtOH level on admission Counseled patient on complete cessation of all EtOH use especially in setting of history of pancreatitis

## 2023-11-05 NOTE — Assessment & Plan Note (Addendum)
 Etiology workup in progress at this time Check EtOH, triglyceride level, procalcitonin, portable chest x-ray on admission Status post sodium chloride  1 L bolus per EDP Continue with LR infusion at 150 mL/h, 1 day ordered Symptomatic support: Morphine  4 mg IV every 4 hours as needed for moderate pain, 20 hours of coverage ordered; fentanyl  25 mcg IV every 4 hours as needed for severe pain, 20 hours of coverage ordered; Dilaudid  0.5 mg IV every 4 hours as needed for severe pain not responsive to IV fentanyl , 20 hours of coverage ordered AM team to reevaluate patient at bedside for continued IV pain requirements

## 2023-11-05 NOTE — Assessment & Plan Note (Signed)
 In setting of elevated high sensitive troponin Complete echo has been ordered

## 2023-11-05 NOTE — Assessment & Plan Note (Signed)
 Check triglyceride on admission, urgent

## 2023-11-05 NOTE — Progress Notes (Signed)
   11/05/23 1526  Readmission Prevention Plan - High Risk  Transportation Screening Complete  PCP or Specialist appointment within 5-7 days of discharge Complete Clinical Research Associate will schedule at time of discharge)  High Risk Social Work Consult for recovery care planning/counseling (includes patient and caregiver) Complete  High Risk Palliative Care Screening Not Applicable  Medication Review Complete ([place] pharmacy)   HRRA complete.

## 2023-11-05 NOTE — Assessment & Plan Note (Addendum)
 Patient endorses stool is loose and started after taking antibiotics.   Check GI panel and C. difficile screening on admission No antidiarrheal medication will be given until GI panel and C. difficile screening have resulted if negative for infectious diarrhea

## 2023-11-05 NOTE — Assessment & Plan Note (Signed)
 Home atorvastatin  60 mg daily resumed

## 2023-11-05 NOTE — ED Provider Notes (Signed)
 [facility] Provider Note    Event Date/Time   First MD Initiated Contact with Patient 11/05/23 1258     (approximate)   History   Emesis   HPI  Rhonda Espinoza is a 63 y.o. female with history of cognition issues who was seen yesterday by her PCP.  I reviewed this office visit where patient was noted to have diarrhea but abdomen was soft nontender and it.  They described her promethazine  and Zofran  for viral gastroenteritis. CT head and neck were negative but there was a sclerotic lesion of T1 that recommended bone scan if concern for metastatic disease  . She has had previous cholecystectomy. MRCP done on 03/04/2023 did not show any problems with the pancreatic ductal dilatation but did show a cyst on the tail of the pancreas which was stable.   Patient reports having upper abdominal pain with nausea vomiting.  Reports it feels similar to her prior episodes of pancreatitis.  She is already had her gallbladder removed  Physical Exam   Triage Vital Signs: ED Triage Vitals  Encounter Vitals Group     BP 11/05/23 1127 (!) 188/116     Systolic BP Percentile --      Diastolic BP Percentile --      Pulse Rate 11/05/23 1127 (!) 102     Resp 11/05/23 1127 20     Temp 11/05/23 1127 98.2 F (36.8 C)     Temp Source 11/05/23 1127 Oral     SpO2 11/05/23 1127 99 %     Weight 11/05/23 1124 205 lb (93 kg)     Height 11/05/23 1124 5' 8 (1.727 m)     Head Circumference --      Peak Flow --      Pain Score 11/05/23 1124 10     Pain Loc --      Pain Education --      Exclude from Growth Chart --     Most recent vital signs: Vitals:   11/05/23 1127  BP: (!) 188/116  Pulse: (!) 102  Resp: 20  Temp: 98.2 F (36.8 C)  SpO2: 99%     General: Awake, no distress.  CV:  Good peripheral perfusion.  Resp:  Normal effort.  Abd:  No distention.  Slight epigastric tenderness.  No lower abdominal pain.  No rebound, no guarding Other:     ED Results / Procedures  / Treatments   Labs (all labs ordered are listed, but only abnormal results are displayed) Labs Reviewed  LIPASE, BLOOD - Abnormal; Notable for the following components:      Result Value   Lipase 270 (*)    All other components within normal limits  COMPREHENSIVE METABOLIC PANEL - Abnormal; Notable for the following components:   Chloride 96 (*)    CO2 19 (*)    Glucose, Bld 135 (*)    Creatinine, Ser 1.09 (*)    Total Protein 8.6 (*)    Alkaline Phosphatase 142 (*)    GFR, Estimated 58 (*)    Anion gap 24 (*)    All other components within normal limits  CBC - Abnormal; Notable for the following components:   RDW 19.9 (*)    All other components within normal limits  URINALYSIS, ROUTINE W REFLEX MICROSCOPIC     EKG  My interpretation of EKG:    RADIOLOGY None    PROCEDURES:  Critical Care performed: No  Procedures   MEDICATIONS ORDERED IN ED:  Medications  aspirin  EC tablet 81 mg (has no administration in time range)  acetaminophen  (TYLENOL ) tablet 650 mg (has no administration in time range)    Or  acetaminophen  (TYLENOL ) suppository 650 mg (has no administration in time range)  ondansetron  (ZOFRAN ) tablet 4 mg (has no administration in time range)    Or  ondansetron  (ZOFRAN ) injection 4 mg (has no administration in time range)  heparin  injection 5,000 Units (has no administration in time range)  senna-docusate (Senokot-S) tablet 1 tablet (has no administration in time range)  lactated ringers  infusion (has no administration in time range)  morphine  (PF) 4 MG/ML injection 4 mg (has no administration in time range)  fentaNYL  (SUBLIMAZE ) injection 25 mcg (has no administration in time range)  HYDROmorphone  (DILAUDID ) injection 0.5 mg (has no administration in time range)  hydrALAZINE  (APRESOLINE ) injection 5 mg (has no administration in time range)  promethazine  (PHENERGAN ) 12.5 mg in sodium chloride  0.9 % 50 mL IVPB (has no administration in time range)   LORazepam  (ATIVAN ) injection 2 mg (has no administration in time range)  LORazepam  (ATIVAN ) injection 0.5 mg (has no administration in time range)  ondansetron  (ZOFRAN -ODT) disintegrating tablet 4 mg (4 mg Oral Given 11/05/23 1302)  sodium chloride  0.9 % bolus 1,000 mL (1,000 mLs Intravenous New Bag/Given 11/05/23 1422)  ondansetron  (ZOFRAN ) injection 4 mg (4 mg Intravenous Given 11/05/23 1420)  HYDROmorphone  (DILAUDID ) injection 0.5 mg (0.5 mg Intravenous Given 11/05/23 1421)     IMPRESSION / MDM / ASSESSMENT AND PLAN / ED COURSE  I reviewed the triage vital signs and the nursing notes.   Patient's presentation is most consistent with acute presentation with potential threat to life or bodily function.   Patient comes in with nausea vomiting suspect this is related to known pancreatitis.  No overt chest pain.  Will get EKG, add on troponin.  Gallbladder is already been removed.  Patient given some IV Zofran  and IV Dilaudid  IV fluids help with symptoms  CBC is reassuring.  Lipase is significant elevated to 70 but is been downtrending from prior.  CMP shows elevated anion gap with low bicarb with glucose of 135  Discussed with patient CT imaging but she reports that similar pain to when she has had pancreatitis previously.  Do not feel there is a new indication today given this has been consistent with her prior pancreatitis flares  The patient is on the cardiac monitor to evaluate for evidence of arrhythmia and/or significant heart rate changes.      FINAL CLINICAL IMPRESSION(S) / ED DIAGNOSES   Final diagnoses:  Acute pancreatitis, unspecified complication status, unspecified pancreatitis type     Rx / DC Orders   ED Discharge Orders     None        Note:  This document was prepared using Dragon voice recognition software and may include unintentional dictation errors.   Ernest Ronal BRAVO, MD 11/05/23 [phone]

## 2023-11-05 NOTE — H&P (Addendum)
 History and Physical   PERLINE AWE FMW:[id number] DOB: 21-Apr-1961 DOA: 11/05/2023  PCP: Samie Frederick, PA-C  Patient coming from: Home  I have personally briefly reviewed patient's old medical records in [facility] EMR.  Chief Concern: Nausea, vomiting, fever, diarrhea, abdominal pain  HPI: Ms. Rhonda Espinoza is a 63 year old female with history of hypertension, depression, GERD, hyperlipidemia, hypertriglyceridemia, anxiety, who presents emergency department for chief concerns of nausea, vomiting, fever, diarrhea for 2 days.  Vitals in the ED showed temperature of 98.2, respiration 20, heart rate 102, blood pressure 188/116, SpO2 of 99% on room air.  Serum sodium is 139, potassium 4.2, chloride 96, bicarb 19, BUN of 23, serum creatinine 1.09, EGFR 58, nonfasting blood glucose 135, WBC 8.6, hemoglobin 12.3, platelets of 345.  UA was negative for leukocytes, nitrates, blood.  Lipase was elevated at 270.  Alk phos was elevated at 142.  Anion gap was 24.  ED treatment: Dilaudid  0.5 mg IV one-time dose, ondansetron  4 mg IV one-time dose, sodium chloride  1 L bolus. --------------------------------- At bedside, patient able to tell me her name, age, current location, current calendar year.   She reports 3 days of abdominal pain, nausea, diarrhea, and vomiting. She denies current etoh use, last drink of wine was two weeks ago.  She has been vomiting up clear and her diarrhea is brown, loose, too many times to count. She is currently on abx for a left elbow laceration.  She fell 10 days ago and hit her heard and left elbow.  Patient reports that over the past weeks, she has been having worsening weakness and dizziness especially on standing with shortness of breath.  She denies chest pain, current shortness of breath at this time.  She reports that she has been having to catch herself frequently due to these episodes of dizziness and lightheadedness, near syncope episodes.  Social history: She lives  at home with her husband. She denies etoh, tobacco, recreational drug use. 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 She states she last had wine two weeks ago.   ROS: Constitutional: no weight change, no fever ENT/Mouth: no sore throat, no rhinorrhea Eyes: no eye pain, no vision changes Cardiovascular: no chest pain, no dyspnea,  no edema, no palpitations Respiratory: no cough, no sputum, no wheezing Gastrointestinal: + nausea,+o vomiting, + diarrhea, no constipation Genitourinary: no urinary incontinence, no dysuria, no hematuria Musculoskeletal: no arthralgias, no myalgias Skin: no skin lesions, no pruritus, Neuro: + weakness, no loss of consciousness, no syncope Psych: no anxiety, no depression, + decrease appetite Heme/Lymph: no bruising, no bleeding  ED Course: Discussed with EDP, patient requiring hospitalization for chief concerns of acute pancreatitis.  Assessment/Plan  Principal Problem:   Acute pancreatitis Active Problems:   Chronic pain syndrome   Elevated troponin   HLD (hyperlipidemia)   Hypertriglyceridemia   Insomnia   Hypertension, essential   Panic attacks   Elevated LFTs   Seizures (HCC)   ETOH abuse   Depression with anxiety   Truncal obesity   Diarrhea   Near syncope   Assessment and Plan:  * Acute pancreatitis Etiology workup in progress at this time Check EtOH, triglyceride level, procalcitonin, portable chest x-ray on admission Status post sodium chloride  1 L bolus per EDP Continue with LR infusion at 150 mL/h, 1 day ordered Symptomatic support: Morphine  4 mg IV every 4 hours as needed for moderate pain, 20 hours of coverage ordered; fentanyl  25 mcg IV every 4 hours as needed for  severe pain, 20 hours of coverage ordered; Dilaudid  0.5 mg IV every 4 hours as needed for severe pain not responsive to IV fentanyl , 20 hours of coverage ordered AM team to reevaluate patient at bedside for continued IV pain requirements  Elevated  troponin HS Troponin elevated at 60, will check a second high sensitive troponin Differentials include angina, unstable angina EKG has been ordered and pending completion at the time of this dictation Complete echo has been ordered  Near syncope In setting of elevated high sensitive troponin Complete echo has been ordered  Diarrhea Patient endorses stool is loose and started after taking antibiotics.   Check GI panel and C. difficile screening on admission No antidiarrheal medication will be given until GI panel and C. difficile screening have resulted if negative for infectious diarrhea  Truncal obesity Counseled patient on safe and slow weight loss  Depression with anxiety Home duloxetine  60 mg daily resumed Lorazepam  0.5 mg IV every 6 hours as needed for anxiety  ETOH abuse Check EtOH level on admission Counseled patient on complete cessation of all EtOH use especially in setting of history of pancreatitis  Seizures (HCC) Ativan  2 mg as needed, 2 doses ordered for seizure with instructions to administer as appropriate and then let provider know.  Panic attacks Ativan  0.5 mg IV every 6 hours as needed for anxiety, and/or panic attacks, 17 hours of coverage ordered  Hypertension, essential Home amlodipine  10 mg daily resumed Hydralazine  5 mg IV every 6 hours as needed for SBP greater than 170, 5 days ordered  Insomnia Home zolpidem  5 mg nightly as needed for sleep resumed  Hypertriglyceridemia Check triglyceride on admission, urgent  HLD (hyperlipidemia) Home atorvastatin  60 mg daily resumed  Chart reviewed.   DVT prophylaxis: Heparin  5000 units subcutaneous every 8 hours Code Status: Full code Diet: N.p.o.; advance to clear liquid diet as tolerated Family Communication: No, patient states her husband is aware already Disposition Plan: Pending clinical course Consults called: None at this time Admission status: Telemetry medical, inpatient  Past Medical  History:  Diagnosis Date   ADD (attention deficit disorder)    Alcohol withdrawal seizure (HCC)    Alcohol-induced pancreatitis    Anxiety    Arthritis    Asthma    allergy induced per patient   Disc disorder    Hyperlipidemia    Hypertension    Pneumonia    Past Surgical History:  Procedure Laterality Date   CESAREAN SECTION     CHOLECYSTECTOMY  2022   FOOT SURGERY Left    FRACTURE SURGERY Right 07/2020   R wrist    HERNIA REPAIR     KNEE ARTHROSCOPY     KYPHOPLASTY N/A 10/13/2018   Procedure: THORACIC 12 KYPHOPLASTY;  Surgeon: Beuford Anes, MD;  Location: [facility] OR;  Service: Orthopedics;  Laterality: N/A;   KYPHOPLASTY N/A 02/05/2021   Procedure: LUMBAR ONE  AND LUMBAR FOUR KYPHOPLASTY;  Surgeon: Beuford Anes, MD;  Location: [facility] OR;  Service: Orthopedics;  Laterality: N/A;   Social History:  reports that she has never smoked. She has never used smokeless tobacco. She reports current alcohol use of about 4.0 standard drinks of alcohol per week. She reports that she does not use drugs.  Allergies  Allergen Reactions   Latex Other (See Comments)    moreso like bandages- these cause blisters   Wound Dressings Other (See Comments)    moreso like bandages- these cause blisters   Valsartan -Hydrochlorothiazide  Other (See Comments)    Caused pancreatitis  Family History  Problem Relation Age of Onset   Pancreatitis Brother    Seizures Neg Hx    Family history: Family history reviewed and not pertinent.  Prior to Admission medications   Medication Sig Start Date End Date Taking? Authorizing Provider  acetaminophen  (TYLENOL ) 500 MG tablet Take 1,000 mg by mouth as needed for mild pain or headache.    [provider]  ALPRAZolam  (XANAX ) 1 MG tablet Take 1 mg by mouth daily as needed for sleep or anxiety. 05/23/21   [provider]  amLODipine  (NORVASC ) 10 MG tablet Take 1 tablet by mouth daily. 05/06/23   [provider]  aspirin  EC 81 MG tablet  Take 81 mg by mouth in the morning. Swallow whole.    [provider]  atorvastatin  (LIPITOR) 40 MG tablet Take 40 mg by mouth daily.  06/12/16   [provider]  cephALEXin  (KEFLEX ) 500 MG capsule Take 1 capsule (500 mg total) by mouth 4 (four) times daily. 10/28/23   Corlis Burnard DEL, NP  Cholecalciferol 1.25 MG (50000 UT) capsule Take 50,000 Units by mouth once a week. 07/28/22   [provider]  desvenlafaxine  (PRISTIQ ) 100 MG 24 hr tablet Take 100 mg by mouth at bedtime.    [provider]  DULoxetine  (CYMBALTA ) 60 MG capsule Take by mouth. 08/31/23   [provider]  fenofibrate  160 MG tablet Take 160 mg by mouth daily.    [provider]  fexofenadine (ALLEGRA) 180 MG tablet Take 180 mg by mouth daily as needed for allergies or rhinitis.    [provider]  folic acid  (FOLVITE ) 1 MG tablet Take 1 tablet (1 mg total) by mouth daily. Patient not taking: Reported on 08/07/2022 05/20/21   Marilee Leach, MD  Multiple Vitamins-Minerals (MULTIVITAMIN WITH MINERALS) tablet Take 1 tablet by mouth daily.    [provider]  omeprazole (PRILOSEC) 40 MG capsule Take 40 mg by mouth daily.    [provider]  ondansetron  (ZOFRAN ) 4 MG tablet Take 4 mg by mouth as needed for nausea or vomiting. 11/24/22   [provider]  QUEtiapine  (SEROQUEL ) 100 MG tablet Take 100 mg by mouth at bedtime. Patient not taking: Reported on 04/29/2023 01/06/23   [provider]  Teriparatide 620 MCG/2.48ML SOPN INJECT 20 MCG UNDER THE SKIN 1 TIME A DAY. DISCARD PEN 28 DAYS AFTER INITIAL USE 03/12/23   [provider]  tiZANidine  (ZANAFLEX ) 4 MG tablet Take 4 mg by mouth as needed for muscle spasms. 04/26/20   [provider]  zolpidem  (AMBIEN  CR) 12.5 MG CR tablet Take 12.5 mg by mouth at bedtime. 07/16/16   [provider]   Physical Exam: Vitals:   11/05/23 1424 11/05/23 1559 11/05/23 1749 11/05/23 1937   BP: (!) 184/97 (!) 194/88 (!) 200/96 (!) 165/76  Pulse: 86  85 (!) 111  Resp: 18  16 20   Temp: 98 F (36.7 C)  98.2 F (36.8 C) 98.2 F (36.8 C)  TempSrc: Oral   Oral  SpO2: 100%  99% 97%  Weight:      Height:       Constitutional: appears older than chronological age, NAD , calm Eyes: PERRL, lids and conjunctivae normal ENMT: Mucous membranes are moist. Posterior pharynx clear of any exudate or lesions. Age-appropriate dentition. Hearing appropriate Neck: normal, supple, no masses, no thyromegaly Respiratory: clear to auscultation bilaterally, no wheezing, no crackles. Normal respiratory effort. No accessory muscle use.  Cardiovascular: Regular rate and  rhythm, no murmurs / rubs / gallops. No extremity edema. 2+ pedal pulses. No carotid bruits.  Abdomen: Truncal obesity, mild generalized + tenderness, no masses palpated, no hepatosplenomegaly. Bowel sounds positive.  Musculoskeletal: no clubbing / cyanosis. No joint deformity upper and lower extremities. Good ROM, no contractures, no atrophy. Normal muscle tone.  Skin: + Left elbow wound present for 2 weeks after falling. Neurologic: Sensation intact. Strength 5/5 in all 4.  Psychiatric: Normal judgment and insight. Alert and oriented x 3. Normal mood.   EKG: Not indicated at this time  Chest x-ray on Admission: I personally reviewed and I agree= with radiologist reading as below.  DG Chest Port 1 View Result Date: 11/05/2023 CLINICAL DATA:  Abdominal pain.  Intractable nausea and vomiting EXAM: PORTABLE CHEST 1 VIEW COMPARISON:  05/04/2023 FINDINGS: Mildly prominent lung markings in the lower chest bilaterally. Findings are similar to prior examination and suggestive for chronic changes. No focal airspace disease or pulmonary edema. Heart size is normal. Trachea is midline. Negative for a pneumothorax. No acute bone abnormality. Evidence for old left rib fractures. IMPRESSION: No active disease. Electronically Signed   By: Juliene Balder  M.D.   On: 11/05/2023 15:41   Labs on Admission: I have personally reviewed following labs  CBC: Recent Labs  Lab 11/02/23 1547 11/05/23 1205  WBC 9.7 8.6  HGB 11.2* 12.3  HCT 33.4* 36.8  MCV 89.3 90.4  PLT 315 345   Basic Metabolic Panel: Recent Labs  Lab 11/02/23 1547 11/05/23 1205  NA 134* 139  K 4.6 4.2  CL 97* 96*  CO2 23 19*  GLUCOSE 90 135*  BUN 14 23  CREATININE 0.98 1.09*  CALCIUM  8.9 9.5   GFR: Estimated Creatinine Clearance: 64.6 mL/min (A) (by C-G formula based on SCr of 1.09 mg/dL (H)).  Liver Function Tests: Recent Labs  Lab 11/05/23 1205  AST 27  ALT 28  ALKPHOS 142*  BILITOT 0.5  PROT 8.6*  ALBUMIN 4.3   Recent Labs  Lab 11/05/23 1205  LIPASE 270*   CBG: Recent Labs  Lab 11/02/23 1236  GLUCAP 96   Urine analysis:    Component Value Date/Time   COLORURINE YELLOW 11/02/2023 1740   APPEARANCEUR CLEAR 11/02/2023 1740   LABSPEC 1.010 11/02/2023 1740   PHURINE 6.0 11/02/2023 1740   GLUCOSEU NEGATIVE 11/02/2023 1740   HGBUR NEGATIVE 11/02/2023 1740   BILIRUBINUR NEGATIVE 11/02/2023 1740   KETONESUR NEGATIVE 11/02/2023 1740   PROTEINUR NEGATIVE 11/02/2023 1740   NITRITE NEGATIVE 11/02/2023 1740   LEUKOCYTESUR NEGATIVE 11/02/2023 1740   This document was prepared using Dragon Voice Recognition software and may include unintentional dictation errors.  Dr. Sherre [facility]  If 7PM-7AM, please contact overnight-coverage provider If 7AM-7PM, please contact day attending provider [email or web address]  11/05/2023, 7:43 PM

## 2023-11-05 NOTE — Assessment & Plan Note (Signed)
 Ativan  0.5 mg IV every 6 hours as needed for anxiety, and/or panic attacks, 17 hours of coverage ordered

## 2023-11-05 NOTE — Assessment & Plan Note (Signed)
 Home zolpidem  5 mg nightly as needed for sleep resumed

## 2023-11-05 NOTE — ED Triage Notes (Signed)
 C/o emesis, fever, diarrhea X2 days. Cannot keep anything down. Feels shaky. Also reports abdominal pain  Reports she was given zofran  PO and could not keep it down

## 2023-11-05 NOTE — Assessment & Plan Note (Addendum)
 HS Troponin elevated at 60, will check a second high sensitive troponin Differentials include angina, unstable angina EKG has been ordered and pending completion at the time of this dictation Complete echo has been ordered

## 2023-11-05 NOTE — Assessment & Plan Note (Signed)
 Home duloxetine  60 mg daily resumed Lorazepam  0.5 mg IV every 6 hours as needed for anxiety

## 2023-11-06 DIAGNOSIS — K858 Other acute pancreatitis without necrosis or infection: Secondary | ICD-10-CM | POA: Diagnosis not present

## 2023-11-06 LAB — BASIC METABOLIC PANEL
Anion gap: 19 — ABNORMAL HIGH (ref 5–15)
BUN: 15 mg/dL (ref 8–23)
CO2: 20 mmol/L — ABNORMAL LOW (ref 22–32)
Calcium: 8.7 mg/dL — ABNORMAL LOW (ref 8.9–10.3)
Chloride: 101 mmol/L (ref 98–111)
Creatinine, Ser: 0.78 mg/dL (ref 0.44–1.00)
GFR, Estimated: >60 mL/min (ref >60)
Glucose, Bld: 140 mg/dL — ABNORMAL HIGH (ref 70–99)
Potassium: 3.3 mmol/L — ABNORMAL LOW (ref 3.5–5.1)
Sodium: 140 mmol/L (ref 135–145)

## 2023-11-06 LAB — CBC
HCT: 33.6 % — ABNORMAL LOW (ref 36.0–46.0)
Hemoglobin: 11.1 g/dL — ABNORMAL LOW (ref 12.0–15.0)
MCH: 30.1 pg (ref 26.0–34.0)
MCHC: 33 g/dL (ref 30.0–36.0)
MCV: 91.1 fL (ref 80.0–100.0)
Platelets: 351 10*3/uL (ref 150–400)
RBC: 3.69 MIL/uL — ABNORMAL LOW (ref 3.87–5.11)
RDW: 20.4 % — ABNORMAL HIGH (ref 11.5–15.5)
WBC: 8.5 10*3/uL (ref 4.0–10.5)
nRBC: 0 % (ref 0.0–0.2)

## 2023-11-06 MED ORDER — MEDIHONEY WOUND/BURN DRESSING EX PSTE
1.0000 | PASTE | Freq: Every day | CUTANEOUS | Status: DC
  Administered 2023-11-06 – 2023-11-08 (×3): 1 via TOPICAL
  Filled 2023-11-06: qty 44

## 2023-11-06 MED ORDER — ZOLPIDEM TARTRATE 5 MG PO TABS: 10.0000 mg | ORAL_TABLET | Freq: Every day | ORAL | Status: DC

## 2023-11-06 MED ORDER — LORAZEPAM 2 MG/ML IJ SOLN: 2.0000 mg | INTRAMUSCULAR | Status: DC | PRN

## 2023-11-06 MED ORDER — ZOLPIDEM TARTRATE 5 MG PO TABS
5.0000 mg | ORAL_TABLET | Freq: Every day | ORAL | Status: DC
  Administered 2023-11-06 – 2023-11-07 (×2): 5 mg via ORAL
  Filled 2023-11-06 (×2): qty 1

## 2023-11-06 MED ORDER — POTASSIUM CHLORIDE CRYS ER 20 MEQ PO TBCR
40.0000 meq | EXTENDED_RELEASE_TABLET | Freq: Once | ORAL | Status: AC
  Administered 2023-11-06: 40 meq via ORAL
  Filled 2023-11-06: qty 2

## 2023-11-06 MED ORDER — HYDROMORPHONE HCL 1 MG/ML IJ SOLN
0.5000 mg | INTRAMUSCULAR | Status: DC | PRN
  Administered 2023-11-06 – 2023-11-08 (×6): 0.5 mg via INTRAVENOUS
  Filled 2023-11-06 (×6): qty 0.5

## 2023-11-06 MED ORDER — HYDROMORPHONE HCL 1 MG/ML IJ SOLN: 0.5000 mg | INTRAMUSCULAR | Status: DC | PRN

## 2023-11-06 MED ORDER — FAMOTIDINE 20 MG PO TABS
20.0000 mg | ORAL_TABLET | Freq: Two times a day (BID) | ORAL | Status: DC
  Administered 2023-11-06 – 2023-11-08 (×5): 20 mg via ORAL
  Filled 2023-11-06 (×5): qty 1

## 2023-11-06 MED ORDER — ALPRAZOLAM 0.5 MG PO TABS: 1.0000 mg | ORAL_TABLET | Freq: Every day | ORAL | Status: DC | PRN

## 2023-11-06 MED ORDER — ORAL CARE MOUTH RINSE: 15.0000 mL | OROMUCOSAL | Status: DC | PRN

## 2023-11-06 MED ORDER — OXYCODONE-ACETAMINOPHEN 5-325 MG PO TABS
1.0000 | ORAL_TABLET | Freq: Four times a day (QID) | ORAL | Status: DC | PRN
  Administered 2023-11-06 – 2023-11-08 (×4): 1 via ORAL
  Filled 2023-11-06 (×5): qty 1

## 2023-11-06 NOTE — Plan of Care (Signed)

## 2023-11-06 NOTE — Consult Note (Signed)
 WOC Nurse Consult Note: patient with history of L elbow laceration from a fall sutured at Urgent Care 10/28/2023  Reason for Consult: L elbow wound  Wound type: full thickness post trauma  Pressure Injury POA: NA  Measurement: 0.3 cm x 2 cm x 0.3 cm  Wound bed: 25% tan fibrin mostly at superior aspect of skin flap 75% red moist   Drainage (amount, consistency, odor) moderate serosanguinous  Periwound: mild erythema but no fluctuance  Dressing procedure/placement/frequency: Cleanse L elbow wound with NS, apply Medihoney to wound bed daily, cover with dry gauze and silicone foam or Kerlix roll gauze whichever is preferred.   POC discussed with patient and bedside nurse. WOC team will not follow. Re-consult if further needs arise.   Thank you,    Powell Bar MSN, RN-BC, [employer] [phone]

## 2023-11-06 NOTE — Progress Notes (Signed)
 PROGRESS NOTE    Rhonda Espinoza  FMW:[id number] DOB: 10-30-1960 DOA: 11/05/2023 PCP: Samie Frederick, PA-C   Assessment & Plan:   Principal Problem:   Acute pancreatitis Active Problems:   Chronic pain syndrome   Elevated troponin   HLD (hyperlipidemia)   Hypertriglyceridemia   Insomnia   Hypertension, essential   Panic attacks   Elevated LFTs   Seizures (HCC)   ETOH abuse   Depression with anxiety   Truncal obesity   Diarrhea   Near syncope  Assessment and Plan: Acute pancreatitis: etiology unclear, possibly secondary to alcohol. TG are WNL. Alcohol was < 10. Percocet, dilaudid  prn for pain. Zofran  prn nausea/vomiting. Started on clear liquid diet    Elevated troponin: minimal. Likely secondary to demand ischemia. Echo ordered    Near syncope: etiology unclear. Echo ordered    Diarrhea: no more diarrhea since admission so GI PCR panel & c. diff have not been collected yet    Obesity: BMI 31.1. Would benefit from weight loss    Depression: severity unknown. Continue on home dose of duloxetine     ETOH abuse: ethanol was < 10. Received alcohol cessation counseling already    Seizures (HCC) Ativan  2 mg as needed, 2 doses ordered for seizure with instructions to administer as appropriate and then let provider know.   Panic attacks: xanax  prn    HTN: continue on amlodipine     Insomnia: continue on ambien     HLD: continue on statin       DVT prophylaxis: heparin   Code Status: full  Family Communication:  Disposition Plan: likely d/c back home   Level of care: Telemetry Medical Status is: Inpatient Remains inpatient appropriate because: severity of illness, started pt on full liquid diet today     Consultants:    Procedures:   Antimicrobials:   Subjective: Pt c/o abd pain   Objective: Vitals:   11/05/23 1944 11/06/23 0149 11/06/23 0200 11/06/23 0442  BP:  (!) 174/82 (!) 166/71 (!) 156/75  Pulse: (!) 105 (!) 106 (!) 108 (!) 108  Resp:  20  18   Temp:  (!) 97.5 F (36.4 C)  98 F (36.7 C)  TempSrc:  Oral    SpO2: 97% 94%  97%  Weight:      Height:        Intake/Output Summary (Last 24 hours) at 11/06/2023 0829 Last data filed at 11/06/2023 0143 Gross per 24 hour  Intake 1930.29 ml  Output 800 ml  Net 1130.29 ml   Filed Weights   11/05/23 1124  Weight: 93 kg    Examination:  General exam: Appears calm but uncomfortable  Respiratory system: Clear to auscultation. Respiratory effort normal. Cardiovascular system: S1 & S2+. No rubs, gallops or clicks.  Gastrointestinal system: Abdomen is obese, soft and nontender. Normal bowel sounds heard. Central nervous system: Alert and oriented. Moves all extremities  Psychiatry: Judgement and insight appear normal. Flat mood and affect    Data Reviewed: I have personally reviewed following labs and imaging studies  CBC: Recent Labs  Lab 11/02/23 1547 11/05/23 1205 11/06/23 0516  WBC 9.7 8.6 8.5  HGB 11.2* 12.3 11.1*  HCT 33.4* 36.8 33.6*  MCV 89.3 90.4 91.1  PLT 315 345 351   Basic Metabolic Panel: Recent Labs  Lab 11/02/23 1547 11/05/23 1205 11/06/23 0516  NA 134* 139 140  K 4.6 4.2 3.3*  CL 97* 96* 101  CO2 23 19* 20*  GLUCOSE 90 135* 140*  BUN 14 23 15  CREATININE 0.98 1.09* 0.78  CALCIUM  8.9 9.5 8.7*   GFR: Estimated Creatinine Clearance: 88 mL/min (by C-G formula based on SCr of 0.78 mg/dL). Liver Function Tests: Recent Labs  Lab 11/05/23 1205  AST 27  ALT 28  ALKPHOS 142*  BILITOT 0.5  PROT 8.6*  ALBUMIN 4.3   Recent Labs  Lab 11/05/23 1205  LIPASE 270*   No results for input(s): AMMONIA in the last 168 hours. Coagulation Profile: No results for input(s): INR, PROTIME in the last 168 hours. Cardiac Enzymes: No results for input(s): CKTOTAL, CKMB, CKMBINDEX, TROPONINI in the last 168 hours. BNP (last 3 results) No results for input(s): PROBNP in the last 8760 hours. HbA1C: No results for input(s): HGBA1C in the last  72 hours. CBG: Recent Labs  Lab 11/02/23 1236  GLUCAP 96   Lipid Profile: Recent Labs    11/05/23 1542  TRIG 109   Thyroid Function Tests: No results for input(s): TSH, T4TOTAL, FREET4, T3FREE, THYROIDAB in the last 72 hours. Anemia Panel: No results for input(s): VITAMINB12, FOLATE, FERRITIN, TIBC, IRON, RETICCTPCT in the last 72 hours. Sepsis Labs: Recent Labs  Lab 11/05/23 1542  PROCALCITON <0.10    Recent Results (from the past 240 hours)  Resp panel by RT-PCR (RSV, Flu A&B, Covid) Anterior Nasal Swab     Status: None   Collection Time: 11/05/23  2:26 PM   Specimen: Anterior Nasal Swab  Result Value Ref Range Status   SARS Coronavirus 2 by RT PCR NEGATIVE NEGATIVE Final    Comment: (NOTE) SARS-CoV-2 target nucleic acids are NOT DETECTED.  The SARS-CoV-2 RNA is generally detectable in upper respiratory specimens during the acute phase of infection. The lowest concentration of SARS-CoV-2 viral copies this assay can detect is 138 copies/mL. A negative result does not preclude SARS-Cov-2 infection and should not be used as the sole basis for treatment or other patient management decisions. A negative result may occur with  improper specimen collection/handling, submission of specimen other than nasopharyngeal swab, presence of viral mutation(s) within the areas targeted by this assay, and inadequate number of viral copies(<138 copies/mL). A negative result must be combined with clinical observations, patient history, and epidemiological information. The expected result is Negative.  Fact Sheet for Patients:  bloggercourse.com  Fact Sheet for Healthcare Providers:  seriousbroker.it  This test is no t yet approved or cleared by the [place]  FDA and  has been authorized for detection and/or diagnosis of SARS-CoV-2 by FDA under an Emergency Use Authorization (EUA). This EUA will remain  in  effect (meaning this test can be used) for the duration of the COVID-19 declaration under Section 564(b)(1) of the Act, 21 U.S.C.section 360bbb-3(b)(1), unless the authorization is terminated  or revoked sooner.       Influenza A by PCR NEGATIVE NEGATIVE Final   Influenza B by PCR NEGATIVE NEGATIVE Final    Comment: (NOTE) The Xpert Xpress SARS-CoV-2/FLU/RSV plus assay is intended as an aid in the diagnosis of influenza from Nasopharyngeal swab specimens and should not be used as a sole basis for treatment. Nasal washings and aspirates are unacceptable for Xpert Xpress SARS-CoV-2/FLU/RSV testing.  Fact Sheet for Patients: bloggercourse.com  Fact Sheet for Healthcare Providers: seriousbroker.it  This test is not yet approved or cleared by the [place]  FDA and has been authorized for detection and/or diagnosis of SARS-CoV-2 by FDA under an Emergency Use Authorization (EUA). This EUA will remain in effect (meaning this test can be used) for the duration of the  COVID-19 declaration under Section 564(b)(1) of the Act, 21 U.S.C. section 360bbb-3(b)(1), unless the authorization is terminated or revoked.     Resp Syncytial Virus by PCR NEGATIVE NEGATIVE Final    Comment: (NOTE) Fact Sheet for Patients: bloggercourse.com  Fact Sheet for Healthcare Providers: seriousbroker.it  This test is not yet approved or cleared by the [place]  FDA and has been authorized for detection and/or diagnosis of SARS-CoV-2 by FDA under an Emergency Use Authorization (EUA). This EUA will remain in effect (meaning this test can be used) for the duration of the COVID-19 declaration under Section 564(b)(1) of the Act, 21 U.S.C. section 360bbb-3(b)(1), unless the authorization is terminated or revoked.  Performed at [facility], [address] [place], [place] [address]           Radiology Studies: [facility] Chest [place] 1 View Result Date: 11/05/2023 CLINICAL DATA:  Abdominal pain.  Intractable nausea and vomiting EXAM: PORTABLE CHEST 1 VIEW COMPARISON:  05/04/2023 FINDINGS: Mildly prominent lung markings in the lower chest bilaterally. Findings are similar to prior examination and suggestive for chronic changes. No focal airspace disease or pulmonary edema. Heart size is normal. Trachea is midline. Negative for a pneumothorax. No acute bone abnormality. Evidence for old left rib fractures. IMPRESSION: No active disease. Electronically Signed   By: Juliene Balder M.D.   On: 11/05/2023 15:41        Scheduled Meds:  amLODipine   10 mg Oral Daily   aspirin  EC  81 mg Oral q AM   atorvastatin   40 mg Oral Daily   DULoxetine   60 mg Oral Daily   heparin   5,000 Units Subcutaneous Q8H   pantoprazole   80 mg Oral Daily   Continuous Infusions:  cefTRIAXone  (ROCEPHIN )  IV Stopped (11/05/23 1709)   lactated ringers  150 mL/hr at 11/06/23 0715   promethazine  (PHENERGAN ) injection (IM or IVPB) 12.5 mg (11/06/23 0458)     LOS: 1 day      Anthony CHRISTELLA Pouch, MD [facility] Pager [phone]  If 7PM-7AM, please contact night-coverage [email or web address] 11/06/2023, 8:29 AM

## 2023-11-07 DIAGNOSIS — K858 Other acute pancreatitis without necrosis or infection: Secondary | ICD-10-CM | POA: Diagnosis not present

## 2023-11-07 LAB — COMPREHENSIVE METABOLIC PANEL
ALT: 17 U/L (ref 0–44)
AST: 16 U/L (ref 15–41)
Albumin: 3.5 g/dL (ref 3.5–5.0)
Alkaline Phosphatase: 122 U/L (ref 38–126)
Anion gap: 13 (ref 5–15)
BUN: 10 mg/dL (ref 8–23)
CO2: 25 mmol/L (ref 22–32)
Calcium: 8.7 mg/dL — ABNORMAL LOW (ref 8.9–10.3)
Chloride: 99 mmol/L (ref 98–111)
Creatinine, Ser: 0.78 mg/dL (ref 0.44–1.00)
GFR, Estimated: >60 mL/min (ref >60)
Glucose, Bld: 144 mg/dL — ABNORMAL HIGH (ref 70–99)
Potassium: 3.1 mmol/L — ABNORMAL LOW (ref 3.5–5.1)
Sodium: 137 mmol/L (ref 135–145)
Total Bilirubin: 0.6 mg/dL (ref 0.0–1.2)
Total Protein: 7 g/dL (ref 6.5–8.1)

## 2023-11-07 MED ORDER — ENSURE ENLIVE PO LIQD: 237.0000 mL | Freq: Two times a day (BID) | ORAL | Status: DC

## 2023-11-07 MED ORDER — METOPROLOL TARTRATE 5 MG/5ML IV SOLN: 5.0000 mg | Freq: Four times a day (QID) | INTRAVENOUS | Status: DC | PRN

## 2023-11-07 MED ORDER — POTASSIUM CHLORIDE CRYS ER 20 MEQ PO TBCR
40.0000 meq | EXTENDED_RELEASE_TABLET | Freq: Once | ORAL | Status: AC
  Administered 2023-11-07: 40 meq via ORAL
  Filled 2023-11-07: qty 2

## 2023-11-07 NOTE — Progress Notes (Signed)
   11/07/23 0818  Assess: MEWS Score  Temp 98.8 F (37.1 C)  BP (!) 151/91  MAP (mmHg) 110  Pulse Rate (!) 118  Resp 20  Level of Consciousness Alert  SpO2 91 %  O2 Device Room Air  Patient Activity (if Appropriate) In bed  Assess: MEWS Score  MEWS Temp 0  MEWS Systolic 0  MEWS Pulse 2  MEWS RR 0  MEWS LOC 0  MEWS Score 2  MEWS Score Color Yellow  Assess: if the MEWS score is Yellow or Red  Were vital signs accurate and taken at a resting state? Yes  Does the patient meet 2 or more of the SIRS criteria? No  MEWS guidelines implemented  Yes, yellow  Treat  MEWS Interventions Considered administering scheduled or prn medications/treatments as ordered  Take Vital Signs  Increase Vital Sign Frequency  Yellow: Q2hr x1, continue Q4hrs until patient remains green for 12hrs  Escalate  MEWS: Escalate Yellow: Discuss with charge nurse and consider notifying provider and/or RRT  Notify: Charge Nurse/RN  Name of Charge Nurse/RN Notified Alex, RN  Provider Notification  Provider Name/Title Williams MD  Date Provider Notified 11/07/23  Time Provider Notified 0820  Method of Notification Page  Notification Reason Other (Comment) (Yellow MEWS)  Provider response No new orders  Date of Provider Response 11/07/23  Time of Provider Response 0825  Assess: SIRS CRITERIA  SIRS Temperature  0  SIRS Respirations  0  SIRS Pulse 1  SIRS WBC 0  SIRS Score Sum  1

## 2023-11-07 NOTE — Progress Notes (Signed)
 PROGRESS NOTE    Rhonda Espinoza  FMW:[id number] DOB: 1961/08/14 DOA: 11/05/2023 PCP: Samie Frederick, PA-C   Assessment & Plan:   Principal Problem:   Acute pancreatitis Active Problems:   Chronic pain syndrome   Elevated troponin   HLD (hyperlipidemia)   Hypertriglyceridemia   Insomnia   Hypertension, essential   Panic attacks   Elevated LFTs   Seizures (HCC)   ETOH abuse   Depression with anxiety   Truncal obesity   Diarrhea   Near syncope  Assessment and Plan: Acute pancreatitis: etiology unclear, possibly secondary to alcohol. TG are WNL. Alcohol was < 10. Percocet, dilaudid  prn for pain. Zofran  prn nausea/vomiting. Advanced diet to soft    Elevated troponin: minimal. Likely secondary to demand ischemia. Echo ordered but not done yet    Near syncope: etiology unclear. Echo ordered    Diarrhea: resolved  Obesity: BMI 31.1. Would benefit from weight loss    Depression: severity unknown. Continue on home dose of duloxetine     ETOH abuse: ethanol was < 10. Received alcohol cessation counseling already    Seizures: not on any seizure meds as per med rec    Panic attacks: xanax  prn    HTN: continue on CCB    Insomnia: continue on ambien     HLD: continue on statin       DVT prophylaxis: heparin   Code Status: full  Family Communication:  Disposition Plan: likely d/c back home   Level of care: Telemetry Medical Status is: Inpatient Remains inpatient appropriate because: severity of illness, still c/o abd pain      Consultants:    Procedures:   Antimicrobials:   Subjective: Pt still c/o abd pain   Objective: Vitals:   11/07/23 0401 11/07/23 0410 11/07/23 0455 11/07/23 0818  BP: (!) 188/99  (!) 155/78 (!) 151/91  Pulse: (!) 112 (!) 109  (!) 118  Resp: 20   20  Temp: 97.8 F (36.6 C)   98.8 F (37.1 C)  TempSrc: Oral   Oral  SpO2: 96% 97%  91%  Weight:      Height:        Intake/Output Summary (Last 24 hours) at 11/07/2023 0830 Last data  filed at 11/06/2023 2300 Gross per 24 hour  Intake 2584.2 ml  Output 1050 ml  Net 1534.2 ml   Filed Weights   11/05/23 1124  Weight: 93 kg    Examination:  General exam: appears comfortable   Respiratory system: decreased breath sounds b/l  Cardiovascular system: S1/S2+. No rubs or gallops  Gastrointestinal system: abd is soft, tenderness to palpation & normal bowel sounds Central nervous system: alert & oriented. Moves all extremities  Psychiatry: Judgement and insight appears at baseline. Flat mood and affect     Data Reviewed: I have personally reviewed following labs and imaging studies  CBC: Recent Labs  Lab 11/02/23 1547 11/05/23 1205 11/06/23 0516  WBC 9.7 8.6 8.5  HGB 11.2* 12.3 11.1*  HCT 33.4* 36.8 33.6*  MCV 89.3 90.4 91.1  PLT 315 345 351   Basic Metabolic Panel: Recent Labs  Lab 11/02/23 1547 11/05/23 1205 11/06/23 0516  NA 134* 139 140  K 4.6 4.2 3.3*  CL 97* 96* 101  CO2 23 19* 20*  GLUCOSE 90 135* 140*  BUN 14 23 15   CREATININE 0.98 1.09* 0.78  CALCIUM  8.9 9.5 8.7*   GFR: Estimated Creatinine Clearance: 88 mL/min (by C-G formula based on SCr of 0.78 mg/dL). Liver Function Tests: Recent Labs  Lab 11/05/23 1205  AST 27  ALT 28  ALKPHOS 142*  BILITOT 0.5  PROT 8.6*  ALBUMIN 4.3   Recent Labs  Lab 11/05/23 1205  LIPASE 270*   No results for input(s): AMMONIA in the last 168 hours. Coagulation Profile: No results for input(s): INR, PROTIME in the last 168 hours. Cardiac Enzymes: No results for input(s): CKTOTAL, CKMB, CKMBINDEX, TROPONINI in the last 168 hours. BNP (last 3 results) No results for input(s): PROBNP in the last 8760 hours. HbA1C: No results for input(s): HGBA1C in the last 72 hours. CBG: Recent Labs  Lab 11/02/23 1236  GLUCAP 96   Lipid Profile: Recent Labs    11/05/23 1542  TRIG 109   Thyroid Function Tests: No results for input(s): TSH, T4TOTAL, FREET4, T3FREE, THYROIDAB in  the last 72 hours. Anemia Panel: No results for input(s): VITAMINB12, FOLATE, FERRITIN, TIBC, IRON, RETICCTPCT in the last 72 hours. Sepsis Labs: Recent Labs  Lab 11/05/23 1542  PROCALCITON <0.10    Recent Results (from the past 240 hours)  Resp panel by RT-PCR (RSV, Flu A&B, Covid) Anterior Nasal Swab     Status: None   Collection Time: 11/05/23  2:26 PM   Specimen: Anterior Nasal Swab  Result Value Ref Range Status   SARS Coronavirus 2 by RT PCR NEGATIVE NEGATIVE Final    Comment: (NOTE) SARS-CoV-2 target nucleic acids are NOT DETECTED.  The SARS-CoV-2 RNA is generally detectable in upper respiratory specimens during the acute phase of infection. The lowest concentration of SARS-CoV-2 viral copies this assay can detect is 138 copies/mL. A negative result does not preclude SARS-Cov-2 infection and should not be used as the sole basis for treatment or other patient management decisions. A negative result may occur with  improper specimen collection/handling, submission of specimen other than nasopharyngeal swab, presence of viral mutation(s) within the areas targeted by this assay, and inadequate number of viral copies(<138 copies/mL). A negative result must be combined with clinical observations, patient history, and epidemiological information. The expected result is Negative.  Fact Sheet for Patients:  bloggercourse.com  Fact Sheet for Healthcare Providers:  seriousbroker.it  This test is no t yet approved or cleared by the [place]  FDA and  has been authorized for detection and/or diagnosis of SARS-CoV-2 by FDA under an Emergency Use Authorization (EUA). This EUA will remain  in effect (meaning this test can be used) for the duration of the COVID-19 declaration under Section 564(b)(1) of the Act, 21 U.S.C.section 360bbb-3(b)(1), unless the authorization is terminated  or revoked sooner.        Influenza A by PCR NEGATIVE NEGATIVE Final   Influenza B by PCR NEGATIVE NEGATIVE Final    Comment: (NOTE) The Xpert Xpress SARS-CoV-2/FLU/RSV plus assay is intended as an aid in the diagnosis of influenza from Nasopharyngeal swab specimens and should not be used as a sole basis for treatment. Nasal washings and aspirates are unacceptable for Xpert Xpress SARS-CoV-2/FLU/RSV testing.  Fact Sheet for Patients: bloggercourse.com  Fact Sheet for Healthcare Providers: seriousbroker.it  This test is not yet approved or cleared by the [place]  FDA and has been authorized for detection and/or diagnosis of SARS-CoV-2 by FDA under an Emergency Use Authorization (EUA). This EUA will remain in effect (meaning this test can be used) for the duration of the COVID-19 declaration under Section 564(b)(1) of the Act, 21 U.S.C. section 360bbb-3(b)(1), unless the authorization is terminated or revoked.     Resp Syncytial Virus by PCR NEGATIVE NEGATIVE Final  Comment: (NOTE) Fact Sheet for Patients: bloggercourse.com  Fact Sheet for Healthcare Providers: seriousbroker.it  This test is not yet approved or cleared by the [place]  FDA and has been authorized for detection and/or diagnosis of SARS-CoV-2 by FDA under an Emergency Use Authorization (EUA). This EUA will remain in effect (meaning this test can be used) for the duration of the COVID-19 declaration under Section 564(b)(1) of the Act, 21 U.S.C. section 360bbb-3(b)(1), unless the authorization is terminated or revoked.  Performed at [facility], [address], [place], [place] [address]          Radiology Studies: [facility] Chest [place] 1 View Result Date: 11/05/2023 CLINICAL DATA:  Abdominal pain.  Intractable nausea and vomiting EXAM: PORTABLE CHEST 1 VIEW COMPARISON:  05/04/2023 FINDINGS: Mildly prominent lung markings  in the lower chest bilaterally. Findings are similar to prior examination and suggestive for chronic changes. No focal airspace disease or pulmonary edema. Heart size is normal. Trachea is midline. Negative for a pneumothorax. No acute bone abnormality. Evidence for old left rib fractures. IMPRESSION: No active disease. Electronically Signed   By: Juliene Balder M.D.   On: 11/05/2023 15:41        Scheduled Meds:  amLODipine   10 mg Oral Daily   aspirin  EC  81 mg Oral q AM   atorvastatin   40 mg Oral Daily   DULoxetine   60 mg Oral Daily   famotidine   20 mg Oral BID   heparin   5,000 Units Subcutaneous Q8H   leptospermum manuka honey  1 Application Topical Daily   pantoprazole   80 mg Oral Daily   zolpidem   5 mg Oral QHS   Continuous Infusions:  cefTRIAXone  (ROCEPHIN )  IV Stopped (11/06/23 1624)     LOS: 2 days      Anthony CHRISTELLA Pouch, MD [facility] Pager [phone]  If 7PM-7AM, please contact night-coverage [email or web address] 11/07/2023, 8:30 AM

## 2023-11-07 NOTE — Progress Notes (Signed)
 PT Cancellation Note  Patient Details Name: Rhonda Espinoza MRN: [id number] DOB: 17-Dec-1960   Cancelled Treatment:    Reason Eval/Treat Not Completed: Patient declined, no reason specified;Other (comment)  PT greeted patient. She reports she does not want to pursue acute care physical therapy. She is currently receiving outpatient services at [facility] and wishes to just continue that upon discharge. Pt reports feeling comfortable going home and has no concerns accessing her home environment.  Will complete order and discharge in house per patient request.   Lexis Potenza PT, DPT 11/07/2023, 2:34 PM

## 2023-11-07 NOTE — Plan of Care (Signed)

## 2023-11-08 ENCOUNTER — Inpatient Hospital Stay
Admit: 2023-11-08 | Discharge: 2023-11-08 | Disposition: A | Discharge status: Home / Self Care | Payer: BC Managed Care – PPO | Attending: Internal Medicine | Admitting: Internal Medicine

## 2023-11-08 DIAGNOSIS — K858 Other acute pancreatitis without necrosis or infection: Secondary | ICD-10-CM | POA: Diagnosis not present

## 2023-11-08 LAB — ECHOCARDIOGRAM COMPLETE
AR max vel: 2.08 cm2
AV Area VTI: 1.82 cm2
AV Area mean vel: 1.9 cm2
AV Mean grad: 6.5 mm[Hg]
AV Peak grad: 9.9 mm[Hg]
Ao pk vel: 1.58 m/s
Area-P 1/2: 6.54 cm2
Calc EF: 87.2 %
Height: 68 in
S' Lateral: 2.7 cm
Single Plane A2C EF: 86.8 %
Single Plane A4C EF: 85.9 %
Weight: 3280 [oz_av]

## 2023-11-08 MED ORDER — CEFDINIR 300 MG PO CAPS: 300.0000 mg | ORAL_CAPSULE | Freq: Two times a day (BID) | ORAL | 0 refills | Status: AC

## 2023-11-08 MED ORDER — PERFLUTREN LIPID MICROSPHERE
1.0000 mL | INTRAVENOUS | Status: AC | PRN
  Administered 2023-11-08: 3 mL via INTRAVENOUS

## 2023-11-08 MED ORDER — OXYCODONE-ACETAMINOPHEN 5-325 MG PO TABS: 2.0000 | ORAL_TABLET | Freq: Four times a day (QID) | ORAL | Status: DC | PRN

## 2023-11-08 NOTE — Plan of Care (Signed)
   Problem: Education: Goal: Knowledge of General Education information will improve Description Including pain rating scale, medication(s)/side effects and non-pharmacologic comfort measures Outcome: Progressing   Problem: Health Behavior/Discharge Planning: Goal: Ability to manage health-related needs will improve Outcome: Progressing

## 2023-11-08 NOTE — Progress Notes (Signed)
 Approximately 1730--Pt discharged to home. At time of discharge, pt A&O x 4 and VSS. AVS discharge instructions provided to pt with teach-back technique utilized. All questions and concerns addressed at this time. All pt belongings taken with pt--pt verified. All PIVs removed.

## 2023-11-08 NOTE — Plan of Care (Signed)

## 2023-11-08 NOTE — Progress Notes (Signed)
   11/08/23 0817  Assess: MEWS Score  Temp 98.3 F (36.8 C)  BP (!) 156/78  MAP (mmHg) 97  Pulse Rate (!) 122  Resp 18  Level of Consciousness Alert  SpO2 94 %  O2 Device Room Air  Assess: MEWS Score  MEWS Temp 0  MEWS Systolic 0  MEWS Pulse 2  MEWS RR 0  MEWS LOC 0  MEWS Score 2  MEWS Score Color Yellow  Assess: if the MEWS score is Yellow or Red  Were vital signs accurate and taken at a resting state? Yes  Does the patient meet 2 or more of the SIRS criteria? No  MEWS guidelines implemented  Yes, yellow  Treat  MEWS Interventions Considered administering scheduled or prn medications/treatments as ordered  Take Vital Signs  Increase Vital Sign Frequency  Yellow: Q2hr x1, continue Q4hrs until patient remains green for 12hrs  Escalate  MEWS: Escalate Yellow: Discuss with charge nurse and consider notifying provider and/or RRT  Notify: Charge Nurse/RN  Name of Charge Nurse/RN Notified Misty, RN  Provider Notification  Provider Name/Title Williams MD  Date Provider Notified 11/08/23  Time Provider Notified 0830  Method of Notification Page  Notification Reason Other (Comment) (Yellow MEWS)  Provider response No new orders  Date of Provider Response 11/08/23  Time of Provider Response 0835  Assess: SIRS CRITERIA  SIRS Temperature  0  SIRS Respirations  0  SIRS Pulse 1  SIRS WBC 0  SIRS Score Sum  1

## 2023-11-08 NOTE — Discharge Summary (Signed)
 Physician Discharge Summary  SKI POLICH FMW:[id number] DOB: [birth year] DOA: 11/05/2023  PCP: Samie Frederick, PA-C  Admit date: 11/05/2023 Discharge date: 11/08/2023  Admitted From: home Disposition:  home   Recommendations for Outpatient Follow-up:  Follow up with PCP in 1-2 weeks   Home Health: no Equipment/Devices:  Discharge Condition: stable  CODE STATUS: full  Diet recommendation: Heart healthy   Brief/Interim Summary: HPI was taken from Dr. Sherre: Ms. Hideko Esselman is a 63 year old female with history of hypertension, depression, GERD, hyperlipidemia, hypertriglyceridemia, anxiety, who presents emergency department for chief concerns of nausea, vomiting, fever, diarrhea for 2 days.   Vitals in the ED showed temperature of 98.2, respiration 20, heart rate 102, blood pressure 188/116, SpO2 of 99% on room air.   Serum sodium is 139, potassium 4.2, chloride 96, bicarb 19, BUN of 23, serum creatinine 1.09, EGFR 58, nonfasting blood glucose 135, WBC 8.6, hemoglobin 12.3, platelets of 345.   UA was negative for leukocytes, nitrates, blood.  Lipase was elevated at 270.  Alk phos was elevated at 142.  Anion gap was 24.   ED treatment: Dilaudid  0.5 mg IV one-time dose, ondansetron  4 mg IV one-time dose, sodium chloride  1 L bolus. --------------------------------- At bedside, patient able to tell me her name, age, current location, current calendar year.    She reports 3 days of abdominal pain, nausea, diarrhea, and vomiting. She denies current etoh use, last drink of wine was two weeks ago.   She has been vomiting up clear and her diarrhea is brown, loose, too many times to count. She is currently on abx for a left elbow laceration.   She fell 10 days ago and hit her heard and left elbow.  Patient reports that over the past weeks, she has been having worsening weakness and dizziness especially on standing with shortness of breath.  She denies chest pain, current shortness of breath at this  time.  She reports that she has been having to catch herself frequently due to these episodes of dizziness and lightheadedness, near syncope episodes.  Discharge Diagnoses:  Principal Problem:   Acute pancreatitis Active Problems:   Chronic pain syndrome   Elevated troponin   HLD (hyperlipidemia)   Hypertriglyceridemia   Insomnia   Hypertension, essential   Panic attacks   Elevated LFTs   Seizures (HCC)   ETOH abuse   Depression with anxiety   Truncal obesity   Diarrhea   Near syncope  Acute pancreatitis: etiology unclear, possibly secondary to alcohol. TG are WNL. Alcohol was < 10. Percocet, dilaudid  prn for pain. Zofran  prn nausea/vomiting. Tolerating po diet prior to d/c. Resolved    Elevated troponin: minimal. Likely secondary to demand ischemia. Echo shows EF 70-75%, grade I diastolic dysfunction, no regional wall motion abnormalities & no atrial shunts    Near syncope: etiology unclear. Echo results above. Resolved    Diarrhea: resolved   Obesity: BMI 31.1. Would benefit from weight loss    Depression: severity unknown. Continue on home dose of duloxetine     ETOH abuse: ethanol was < 10. Received alcohol cessation counseling already    Seizures: not on any seizure meds as per med rec    Panic attacks: xanax  prn    HTN: continue on CCB    Insomnia: continue on ambien     HLD: continue on statin   Discharge Instructions  Discharge Instructions     Diet - low sodium heart healthy   Complete by: As directed    Discharge instructions  Complete by: As directed    F/u w/ PCP in 1-2 weeks   Discharge wound care:   Complete by: As directed    Wound care  Daily      Comments: Cleanse L elbow wound with NS, apply Medihoney to wound bed daily, cover with dry gauze and silicone foam or Kerlix roll gauze whichever is preferred.   Increase activity slowly   Complete by: As directed       Allergies as of 11/08/2023       Reactions   Latex Other (See Comments)    moreso like bandages- these cause blisters   Wound Dressings Other (See Comments)   moreso like bandages- these cause blisters   Valsartan -hydrochlorothiazide  Other (See Comments)   Caused pancreatitis         Medication List     STOP taking these medications    cephALEXin  500 MG capsule Commonly known as: KEFLEX        TAKE these medications    acetaminophen  500 MG tablet Commonly known as: TYLENOL  Take 1,000 mg by mouth as needed for mild pain or headache.   ALPRAZolam  1 MG tablet Commonly known as: XANAX  Take 1 mg by mouth 3 (three) times daily as needed for sleep or anxiety.   amLODipine  10 MG tablet Commonly known as: NORVASC  Take 1 tablet by mouth daily.   aspirin  EC 81 MG tablet Take 81 mg by mouth in the morning. Swallow whole.   atorvastatin  40 MG tablet Commonly known as: LIPITOR Take 40 mg by mouth daily.   cefdinir  300 MG capsule Commonly known as: OMNICEF  Take 1 capsule (300 mg total) by mouth 2 (two) times daily for 3 days.   Cholecalciferol 1.25 MG (50000 UT) capsule Take 50,000 Units by mouth once a week.   desvenlafaxine  100 MG 24 hr tablet Commonly known as: PRISTIQ  Take 100 mg by mouth at bedtime.   DULoxetine  60 MG capsule Commonly known as: CYMBALTA  Take 60 mg by mouth daily.   fenofibrate  160 MG tablet Take 160 mg by mouth daily.   fexofenadine 180 MG tablet Commonly known as: ALLEGRA Take 180 mg by mouth daily as needed for allergies or rhinitis.   folic acid  1 MG tablet Commonly known as: FOLVITE  Take 1 tablet (1 mg total) by mouth daily.   hyoscyamine 0.375 MG 12 hr tablet Commonly known as: LEVBID Take 0.375 mg by mouth every 12 (twelve) hours as needed.   meloxicam 7.5 MG tablet Commonly known as: MOBIC Take 7.5 mg by mouth daily.   multivitamin with minerals tablet Take 1 tablet by mouth daily.   ondansetron  4 MG tablet Commonly known as: ZOFRAN  Take 4 mg by mouth as needed for nausea or vomiting.    ondansetron  8 MG disintegrating tablet Commonly known as: ZOFRAN -ODT Take 8 mg by mouth every 8 (eight) hours as needed for nausea.   pantoprazole  40 MG tablet Commonly known as: PROTONIX  Take 40 mg by mouth 2 (two) times daily.   QUEtiapine  100 MG tablet Commonly known as: SEROQUEL  Take 100 mg by mouth at bedtime.   Teriparatide 620 MCG/2.48ML Sopn INJECT 20 MCG UNDER THE SKIN 1 TIME A DAY. DISCARD PEN 28 DAYS AFTER INITIAL USE   traMADol 50 MG tablet Commonly known as: ULTRAM Take 50 mg by mouth every 12 (twelve) hours as needed.   traZODone 100 MG tablet Commonly known as: DESYREL Take 1 tablet by mouth at bedtime.   triamcinolone  cream 0.1 % Commonly known as: KENALOG   Apply 1 Application topically 2 (two) times daily.   zolpidem  12.5 MG CR tablet Commonly known as: AMBIEN  CR Take 12.5 mg by mouth at bedtime.               Discharge Care Instructions  (From admission, onward)           Start     Ordered   11/08/23 0000  Discharge wound care:       Comments: Wound care  Daily      Comments: Cleanse L elbow wound with NS, apply Medihoney to wound bed daily, cover with dry gauze and silicone foam or Kerlix roll gauze whichever is preferred.   11/08/23 1438            Allergies  Allergen Reactions   Latex Other (See Comments)    moreso like bandages- these cause blisters   Wound Dressings Other (See Comments)    moreso like bandages- these cause blisters   Valsartan -Hydrochlorothiazide  Other (See Comments)    Caused pancreatitis     Consultations:    Procedures/Studies: ECHOCARDIOGRAM COMPLETE Result Date: 11/08/2023    ECHOCARDIOGRAM REPORT   Patient Name:   JAHNIYA DUZAN Weese Date of Exam: 11/08/2023 Medical Rec #:  [id number]     Height:       68.0 in Accession #:    [id number]    Weight:       205.0 lb Date of Birth:  1961-04-11      BSA:          2.065 m Patient Age:    62 years      BP:           149/87 mmHg Patient Gender: F             HR:            114 bpm. Exam Location:  [facility] Procedure: 2D Echo, Color Doppler, Cardiac Doppler and Intracardiac            Opacification Agent Indications:     Elevated Troponin  History:         Patient has no prior history of Echocardiogram examinations.                  Risk Factors:Dyslipidemia and Hypertension.  Sonographer:     L. Thornton-Maynard, RDCS Referring Phys:  [id number] AMY N COX Diagnosing Phys: Cara JONETTA Lovelace MD  Sonographer Comments: Suboptimal parasternal window and suboptimal apical window. IMPRESSIONS  1. TDS.  2. Left ventricular ejection fraction, by estimation, is 70 to 75%. The left ventricle has hyperdynamic function. The left ventricle has no regional wall motion abnormalities. There is moderate concentric left ventricular hypertrophy. Left ventricular diastolic parameters are consistent with Grade I diastolic dysfunction (impaired relaxation).  3. Right ventricular systolic function is normal. The right ventricular size is normal. There is normal pulmonary artery systolic pressure.  4. The mitral valve is normal in structure. Mild mitral valve regurgitation.  5. The aortic valve is normal in structure. Aortic valve regurgitation is not visualized. Aortic valve sclerosis is present, with no evidence of aortic valve stenosis. Conclusion(s)/Recommendation(s): Poor windows for evaluation of left ventricular function by transthoracic echocardiography. Would recommend an alternative means of evaluation. FINDINGS  Left Ventricle: Left ventricular ejection fraction, by estimation, is 70 to 75%. The left ventricle has hyperdynamic function. The left ventricle has no regional wall motion abnormalities. Definity  contrast agent was given IV to delineate the left ventricular endocardial borders. The left ventricular  internal cavity size was normal in size. There is moderate concentric left ventricular hypertrophy. Left ventricular diastolic parameters are consistent with Grade I diastolic dysfunction  (impaired relaxation). Right Ventricle: The right ventricular size is normal. No increase in right ventricular wall thickness. Right ventricular systolic function is normal. There is normal pulmonary artery systolic pressure. The tricuspid regurgitant velocity is 2.80 m/s, and  with an assumed right atrial pressure of 3 mmHg, the estimated right ventricular systolic pressure is 34.4 mmHg. Left Atrium: Left atrial size was normal in size. Right Atrium: Right atrial size was normal in size. Pericardium: There is no evidence of pericardial effusion. Mitral Valve: The mitral valve is normal in structure. Mild mitral valve regurgitation. Tricuspid Valve: The tricuspid valve is normal in structure. Tricuspid valve regurgitation is mild. Aortic Valve: The aortic valve is normal in structure. Aortic valve regurgitation is not visualized. Aortic valve sclerosis is present, with no evidence of aortic valve stenosis. Aortic valve mean gradient measures 6.5 mmHg. Aortic valve peak gradient measures 9.9 mmHg. Aortic valve area, by VTI measures 1.82 cm. Pulmonic Valve: The pulmonic valve was normal in structure. Pulmonic valve regurgitation is not visualized. Aorta: The ascending aorta was not well visualized. IAS/Shunts: No atrial level shunt detected by color flow Doppler. Additional Comments: TDS.  LEFT VENTRICLE PLAX 2D LVIDd:         4.00 cm     Diastology LVIDs:         2.70 cm     LV e' medial:    6.09 cm/s LV PW:         1.60 cm     LV E/e' medial:  14.1 LV IVS:        1.50 cm     LV e' lateral:   6.31 cm/s LVOT diam:     1.70 cm     LV E/e' lateral: 13.6 LV SV:         44 LV SV Index:   22 LVOT Area:     2.27 cm  LV Volumes (MOD) LV vol d, MOD A2C: 66.7 ml LV vol d, MOD A4C: 78.2 ml LV vol s, MOD A2C: 8.8 ml LV vol s, MOD A4C: 11.0 ml LV SV MOD A2C:     57.9 ml LV SV MOD A4C:     78.2 ml LV SV MOD BP:      65.7 ml RIGHT VENTRICLE             IVC RV S prime:     23.90 cm/s  IVC diam: 1.30 cm TAPSE (M-mode): 2.4 cm LEFT  ATRIUM             Index LA diam:        2.90 cm 1.40 cm/m LA Vol (A2C):   26.6 ml 12.88 ml/m LA Vol (A4C):   33.8 ml 16.37 ml/m LA Biplane Vol: 31.1 ml 15.06 ml/m  AORTIC VALVE                     PULMONIC VALVE AV Area (Vmax):    2.08 cm      PV Vmax:       1.44 m/s AV Area (Vmean):   1.90 cm      PV Peak grad:  8.3 mmHg AV Area (VTI):     1.82 cm AV Vmax:           157.50 cm/s AV Vmean:          120.500  cm/s AV VTI:            0.244 m AV Peak Grad:      9.9 mmHg AV Mean Grad:      6.5 mmHg LVOT Vmax:         144.00 cm/s LVOT Vmean:        101.000 cm/s LVOT VTI:          0.196 m LVOT/AV VTI ratio: 0.80  AORTA Ao Root diam: 3.10 cm Ao Asc diam:  3.30 cm MITRAL VALVE                TRICUSPID VALVE MV Area (PHT): 6.54 cm     TR Peak grad:   31.4 mmHg MV Decel Time: 116 msec     TR Vmax:        280.00 cm/s MV E velocity: 85.90 cm/s MV A velocity: 128.00 cm/s  SHUNTS MV E/A ratio:  0.67         Systemic VTI:  0.20 m                             Systemic Diam: 1.70 cm Cara JONETTA Lovelace MD Electronically signed by Cara JONETTA Lovelace MD Signature Date/Time: 11/08/2023/2:00:53 PM    Final    DG Chest Port 1 View Result Date: 11/05/2023 CLINICAL DATA:  Abdominal pain.  Intractable nausea and vomiting EXAM: PORTABLE CHEST 1 VIEW COMPARISON:  05/04/2023 FINDINGS: Mildly prominent lung markings in the lower chest bilaterally. Findings are similar to prior examination and suggestive for chronic changes. No focal airspace disease or pulmonary edema. Heart size is normal. Trachea is midline. Negative for a pneumothorax. No acute bone abnormality. Evidence for old left rib fractures. IMPRESSION: No active disease. Electronically Signed   By: Juliene Balder M.D.   On: 11/05/2023 15:41   DG Elbow Complete Left Result Date: 11/02/2023 CLINICAL DATA:  Left elbow pain.  Multiple falls. EXAM: LEFT ELBOW - COMPLETE 3+ VIEW COMPARISON:  None Available. FINDINGS: There is diffuse decreased bone mineralization. There is lucency and  irregularity indicating a laceration posterior to the proximal ulnar diaphysis. Mild associated soft tissue swelling. No elbow joint effusion is seen. No acute fracture or dislocation. Joint spaces are preserved. IMPRESSION: Laceration posterior to the proximal ulnar diaphysis. No acute fracture. Electronically Signed   By: Tanda Lyons M.D.   On: 11/02/2023 18:38   DG Foot Complete Left Result Date: 11/02/2023 CLINICAL DATA:  Foot pain.  Frequent falls. EXAM: LEFT FOOT - COMPLETE 3+ VIEW COMPARISON:  None Available. FINDINGS: There is diffuse decreased bone mineralization. Moderate hallux valgus. Two screws traverse the first tarsometatarsal joint with this incompletely fused. Old healed fracture of the distal second metatarsal shaft. A screw overlies the second metatarsal head. ALS screw traverses the second toe PIP joint which is fused. There is 2 mm lateral cortical step-off of the third metatarsal neck. 1 mm lateral cortical step-off at the fourth and fifth metatarsal necks. These appear to be acute to subacute fractures. Status post instrumented ORIF of the distal tibia and fibula. IMPRESSION: 1. Acute to subacute minimally displaced fractures of the third, fourth, and fifth metatarsal necks. 2. Moderate hallux valgus. 3. Status post instrumented arthrodesis of the first tarsometatarsal joint and second toe PIP joint. Electronically Signed   By: Tanda Lyons M.D.   On: 11/02/2023 18:36   DG Foot Complete Right Result Date: 11/02/2023 CLINICAL DATA:  Right foot pain.  Frequent falls. EXAM: RIGHT FOOT COMPLETE - 3+ VIEW COMPARISON:  None available FINDINGS: There is diffuse decreased bone mineralization. Postsurgical changes of dorsal plate and screw great toe metatarsophalangeal arthrodesis with complete osseous fusion. Additional postsurgical changes of osteotomy of the medial cuneiform fixated by dorsal surgical staple. Surgical staple and two screws traversing the talonavicular joint. Two screws  fixating the posterior subtalar joint. Headless screw traversing the second PIP joint. Small screw within the second metatarsal head. There is a subacute transverse fracture of the fifth metatarsal neck with moderate peripheral healing sclerosis and mild persistent fracture line lucency. Minimal 2 mm dorsal displacement of the distal fracture component with respect to the proximal fracture component. Moderate to severe medial first tarsometatarsal joint space narrowing. IMPRESSION: 1. Subacute transverse fracture of the fifth metatarsal neck with moderate peripheral healing sclerosis and mild persistent fracture line lucency. 2. Postsurgical changes of great toe metatarsophalangeal arthrodesis with complete osseous fusion. 3. Postsurgical changes of osteotomy of the medial cuneiform. 4. Postsurgical changes of talonavicular and posterior subtalar arthrodesis. Electronically Signed   By: Tanda Lyons M.D.   On: 11/02/2023 18:34   CT Head Wo Contrast Result Date: 11/02/2023 CLINICAL DATA:  Multiple episodes of hallucination and dizziness, frequent falls EXAM: CT HEAD WITHOUT CONTRAST CT CERVICAL SPINE WITHOUT CONTRAST TECHNIQUE: Multidetector CT imaging of the head and cervical spine was performed following the standard protocol without intravenous contrast. Multiplanar CT image reconstructions of the cervical spine were also generated. RADIATION DOSE REDUCTION: This exam was performed according to the departmental dose-optimization program which includes automated exposure control, adjustment of the mA and/or kV according to patient size and/or use of iterative reconstruction technique. COMPARISON:  05/05/2022 CT head, no prior CT cervical spine available FINDINGS: CT HEAD FINDINGS Brain: No evidence of acute infarct, hemorrhage, mass, mass effect, or midline shift. No hydrocephalus or extra-axial fluid collection. Vascular: No hyperdense vessel. Atherosclerotic calcifications in the intracranial carotid and  vertebral arteries. Skull: Negative for fracture or focal lesion. Sinuses/Orbits: Mild mucosal thickening in the right anterior ethmoid air cells. Osseous thickening of the right maxillary sinus walls, which likely indicates chronic right maxillary sinusitis. No acute finding in the orbits. Other: The mastoid air cells are well aerated. CT CERVICAL SPINE FINDINGS Alignment: No traumatic listhesis. Anterolisthesis of C3 on C4 and C4 on C5 appears facet mediated. Skull base and vertebrae: No acute fracture. 7 mm sclerotic lesion in the left aspect of T1 (series 5, image [id number] and series 5, image 38) Soft tissues and spinal canal: No prevertebral fluid or swelling. No visible canal hematoma. Disc levels: Degenerative changes in the cervical spine.No high-grade spinal canal stenosis. Upper chest: No focal pulmonary opacity or pleural effusion. IMPRESSION: 1. No acute intracranial process. 2. No acute fracture or traumatic listhesis in the cervical spine. 3. 7 mm sclerotic lesion in the left aspect of T1, which is nonspecific but is somewhat concerning for metastatic disease. If there is clinical concern, a bone scan could be performed for further evaluation. Electronically Signed   By: Donald Campion M.D.   On: 11/02/2023 17:22   CT Cervical Spine Wo Contrast Result Date: 11/02/2023 CLINICAL DATA:  Multiple episodes of hallucination and dizziness, frequent falls EXAM: CT HEAD WITHOUT CONTRAST CT CERVICAL SPINE WITHOUT CONTRAST TECHNIQUE: Multidetector CT imaging of the head and cervical spine was performed following the standard protocol without intravenous contrast. Multiplanar CT image reconstructions of the cervical spine were also generated. RADIATION DOSE REDUCTION: This exam was performed according to the  departmental dose-optimization program which includes automated exposure control, adjustment of the mA and/or kV according to patient size and/or use of iterative reconstruction technique. COMPARISON:   05/05/2022 CT head, no prior CT cervical spine available FINDINGS: CT HEAD FINDINGS Brain: No evidence of acute infarct, hemorrhage, mass, mass effect, or midline shift. No hydrocephalus or extra-axial fluid collection. Vascular: No hyperdense vessel. Atherosclerotic calcifications in the intracranial carotid and vertebral arteries. Skull: Negative for fracture or focal lesion. Sinuses/Orbits: Mild mucosal thickening in the right anterior ethmoid air cells. Osseous thickening of the right maxillary sinus walls, which likely indicates chronic right maxillary sinusitis. No acute finding in the orbits. Other: The mastoid air cells are well aerated. CT CERVICAL SPINE FINDINGS Alignment: No traumatic listhesis. Anterolisthesis of C3 on C4 and C4 on C5 appears facet mediated. Skull base and vertebrae: No acute fracture. 7 mm sclerotic lesion in the left aspect of T1 (series 5, image [id number] and series 5, image 38) Soft tissues and spinal canal: No prevertebral fluid or swelling. No visible canal hematoma. Disc levels: Degenerative changes in the cervical spine.No high-grade spinal canal stenosis. Upper chest: No focal pulmonary opacity or pleural effusion. IMPRESSION: 1. No acute intracranial process. 2. No acute fracture or traumatic listhesis in the cervical spine. 3. 7 mm sclerotic lesion in the left aspect of T1, which is nonspecific but is somewhat concerning for metastatic disease. If there is clinical concern, a bone scan could be performed for further evaluation. Electronically Signed   By: Donald Campion M.D.   On: 11/02/2023 17:22   (Echo, Carotid, EGD, Colonoscopy, ERCP)    Subjective: Pt c/o fatigue   Discharge Exam: Vitals:   11/08/23 0817 11/08/23 1246  BP: (!) 156/78 (!) 140/76  Pulse: (!) 122 (!) 117  Resp: 18 20  Temp: 98.3 F (36.8 C) 98.2 F (36.8 C)  SpO2: 94% 95%   Vitals:   11/08/23 0100 11/08/23 0509 11/08/23 0817 11/08/23 1246  BP: (!) 149/87 (!) 157/86 (!) 156/78 (!) 140/76   Pulse: (!) 110 (!) 114 (!) 122 (!) 117  Resp: 18 17 18 20   Temp: 98 F (36.7 C) 98.9 F (37.2 C) 98.3 F (36.8 C) 98.2 F (36.8 C)  TempSrc: Oral Oral Oral Oral  SpO2: 93% 97% 94% 95%  Weight:      Height:        General: Pt is alert, awake, not in acute distress Cardiovascular: S1/S2 +, no rubs, no gallops Respiratory: CTA bilaterally, no wheezing, no rhonchi Abdominal: Soft, NT, obese, bowel sounds + Extremities: no cyanosis    The results of significant diagnostics from this hospitalization (including imaging, microbiology, ancillary and laboratory) are listed below for reference.     Microbiology: Recent Results (from the past 240 hours)  Resp panel by RT-PCR (RSV, Flu A&B, Covid) Anterior Nasal Swab     Status: None   Collection Time: 11/05/23  2:26 PM   Specimen: Anterior Nasal Swab  Result Value Ref Range Status   SARS Coronavirus 2 by RT PCR NEGATIVE NEGATIVE Final    Comment: (NOTE) SARS-CoV-2 target nucleic acids are NOT DETECTED.  The SARS-CoV-2 RNA is generally detectable in upper respiratory specimens during the acute phase of infection. The lowest concentration of SARS-CoV-2 viral copies this assay can detect is 138 copies/mL. A negative result does not preclude SARS-Cov-2 infection and should not be used as the sole basis for treatment or other patient management decisions. A negative result may occur with  improper specimen collection/handling, submission  of specimen other than nasopharyngeal swab, presence of viral mutation(s) within the areas targeted by this assay, and inadequate number of viral copies(<138 copies/mL). A negative result must be combined with clinical observations, patient history, and epidemiological information. The expected result is Negative.  Fact Sheet for Patients:  bloggercourse.com  Fact Sheet for Healthcare Providers:  seriousbroker.it  This test is no t yet approved or  cleared by the [place]  FDA and  has been authorized for detection and/or diagnosis of SARS-CoV-2 by FDA under an Emergency Use Authorization (EUA). This EUA will remain  in effect (meaning this test can be used) for the duration of the COVID-19 declaration under Section 564(b)(1) of the Act, 21 U.S.C.section 360bbb-3(b)(1), unless the authorization is terminated  or revoked sooner.       Influenza A by PCR NEGATIVE NEGATIVE Final   Influenza B by PCR NEGATIVE NEGATIVE Final    Comment: (NOTE) The Xpert Xpress SARS-CoV-2/FLU/RSV plus assay is intended as an aid in the diagnosis of influenza from Nasopharyngeal swab specimens and should not be used as a sole basis for treatment. Nasal washings and aspirates are unacceptable for Xpert Xpress SARS-CoV-2/FLU/RSV testing.  Fact Sheet for Patients: bloggercourse.com  Fact Sheet for Healthcare Providers: seriousbroker.it  This test is not yet approved or cleared by the [place]  FDA and has been authorized for detection and/or diagnosis of SARS-CoV-2 by FDA under an Emergency Use Authorization (EUA). This EUA will remain in effect (meaning this test can be used) for the duration of the COVID-19 declaration under Section 564(b)(1) of the Act, 21 U.S.C. section 360bbb-3(b)(1), unless the authorization is terminated or revoked.     Resp Syncytial Virus by PCR NEGATIVE NEGATIVE Final    Comment: (NOTE) Fact Sheet for Patients: bloggercourse.com  Fact Sheet for Healthcare Providers: seriousbroker.it  This test is not yet approved or cleared by the [place]  FDA and has been authorized for detection and/or diagnosis of SARS-CoV-2 by FDA under an Emergency Use Authorization (EUA). This EUA will remain in effect (meaning this test can be used) for the duration of the COVID-19 declaration under Section 564(b)(1) of the Act, 21  U.S.C. section 360bbb-3(b)(1), unless the authorization is terminated or revoked.  Performed at [facility], [address] [place], [place] [address]      Labs: BNP (last 3 results) No results for input(s): BNP in the last 8760 hours. Basic Metabolic Panel: Recent Labs  Lab 11/02/23 1547 11/05/23 1205 11/06/23 0516 11/07/23 1007  NA 134* 139 140 137  K 4.6 4.2 3.3* 3.1*  CL 97* 96* 101 99  CO2 23 19* 20* 25  GLUCOSE 90 135* 140* 144*  BUN 14 23 15 10   CREATININE 0.98 1.09* 0.78 0.78  CALCIUM  8.9 9.5 8.7* 8.7*   Liver Function Tests: Recent Labs  Lab 11/05/23 1205 11/07/23 1007  AST 27 16  ALT 28 17  ALKPHOS 142* 122  BILITOT 0.5 0.6  PROT 8.6* 7.0  ALBUMIN 4.3 3.5   Recent Labs  Lab 11/05/23 1205  LIPASE 270*   No results for input(s): AMMONIA in the last 168 hours. CBC: Recent Labs  Lab 11/02/23 1547 11/05/23 1205 11/06/23 0516  WBC 9.7 8.6 8.5  HGB 11.2* 12.3 11.1*  HCT 33.4* 36.8 33.6*  MCV 89.3 90.4 91.1  PLT 315 345 351   Cardiac Enzymes: No results for input(s): CKTOTAL, CKMB, CKMBINDEX, TROPONINI in the last 168 hours. BNP: Invalid input(s): POCBNP CBG: Recent Labs  Lab 11/02/23 1236  GLUCAP 96   D-Dimer No results for input(s): DDIMER in the last 72 hours. Hgb A1c No results for input(s): HGBA1C in the last 72 hours. Lipid Profile Recent Labs    11/05/23 1542  TRIG 109   Thyroid function studies No results for input(s): TSH, T4TOTAL, T3FREE, THYROIDAB in the last 72 hours.  Invalid input(s): FREET3 Anemia work up No results for input(s): VITAMINB12, FOLATE, FERRITIN, TIBC, IRON, RETICCTPCT in the last 72 hours. Urinalysis    Component Value Date/Time   COLORURINE YELLOW (A) 11/05/2023 2222   APPEARANCEUR CLEAR (A) 11/05/2023 2222   LABSPEC 1.014 11/05/2023 2222   PHURINE 5.0 11/05/2023 2222   GLUCOSEU NEGATIVE 11/05/2023 2222   HGBUR NEGATIVE 11/05/2023 2222    BILIRUBINUR NEGATIVE 11/05/2023 2222   KETONESUR 20 (A) 11/05/2023 2222   PROTEINUR NEGATIVE 11/05/2023 2222   NITRITE NEGATIVE 11/05/2023 2222   LEUKOCYTESUR NEGATIVE 11/05/2023 2222   Sepsis Labs Recent Labs  Lab 11/02/23 1547 11/05/23 1205 11/06/23 0516  WBC 9.7 8.6 8.5   Microbiology Recent Results (from the past 240 hours)  Resp panel by RT-PCR (RSV, Flu A&B, Covid) Anterior Nasal Swab     Status: None   Collection Time: 11/05/23  2:26 PM   Specimen: Anterior Nasal Swab  Result Value Ref Range Status   SARS Coronavirus 2 by RT PCR NEGATIVE NEGATIVE Final    Comment: (NOTE) SARS-CoV-2 target nucleic acids are NOT DETECTED.  The SARS-CoV-2 RNA is generally detectable in upper respiratory specimens during the acute phase of infection. The lowest concentration of SARS-CoV-2 viral copies this assay can detect is 138 copies/mL. A negative result does not preclude SARS-Cov-2 infection and should not be used as the sole basis for treatment or other patient management decisions. A negative result may occur with  improper specimen collection/handling, submission of specimen other than nasopharyngeal swab, presence of viral mutation(s) within the areas targeted by this assay, and inadequate number of viral copies(<138 copies/mL). A negative result must be combined with clinical observations, patient history, and epidemiological information. The expected result is Negative.  Fact Sheet for Patients:  bloggercourse.com  Fact Sheet for Healthcare Providers:  seriousbroker.it  This test is no t yet approved or cleared by the [place]  FDA and  has been authorized for detection and/or diagnosis of SARS-CoV-2 by FDA under an Emergency Use Authorization (EUA). This EUA will remain  in effect (meaning this test can be used) for the duration of the COVID-19 declaration under Section 564(b)(1) of the Act, 21 U.S.C.section  360bbb-3(b)(1), unless the authorization is terminated  or revoked sooner.       Influenza A by PCR NEGATIVE NEGATIVE Final   Influenza B by PCR NEGATIVE NEGATIVE Final    Comment: (NOTE) The Xpert Xpress SARS-CoV-2/FLU/RSV plus assay is intended as an aid in the diagnosis of influenza from Nasopharyngeal swab specimens and should not be used as a sole basis for treatment. Nasal washings and aspirates are unacceptable for Xpert Xpress SARS-CoV-2/FLU/RSV testing.  Fact Sheet for Patients: bloggercourse.com  Fact Sheet for Healthcare Providers: seriousbroker.it  This test is not yet approved or cleared by the [place]  FDA and has been authorized for detection and/or diagnosis of SARS-CoV-2 by FDA under an Emergency Use Authorization (EUA). This EUA will remain in effect (meaning this test can be used) for the duration of the COVID-19 declaration under Section 564(b)(1) of the Act, 21 U.S.C. section 360bbb-3(b)(1), unless the authorization is terminated or revoked.     Resp Syncytial Virus  by PCR NEGATIVE NEGATIVE Final    Comment: (NOTE) Fact Sheet for Patients: bloggercourse.com  Fact Sheet for Healthcare Providers: seriousbroker.it  This test is not yet approved or cleared by the [place]  FDA and has been authorized for detection and/or diagnosis of SARS-CoV-2 by FDA under an Emergency Use Authorization (EUA). This EUA will remain in effect (meaning this test can be used) for the duration of the COVID-19 declaration under Section 564(b)(1) of the Act, 21 U.S.C. section 360bbb-3(b)(1), unless the authorization is terminated or revoked.  Performed at [facility], [address], [place], [place] [address]      Time coordinating discharge: Over 30 minutes  SIGNED:   Anthony CHRISTELLA Pouch, MD  [facility] 11/08/2023, 2:42 PM Pager   If  7PM-7AM, please contact night-coverage [email or web address]

## 2023-11-11 ENCOUNTER — Emergency Department (HOSPITAL_BASED_OUTPATIENT_CLINIC_OR_DEPARTMENT_OTHER): Payer: BC Managed Care – PPO

## 2023-11-11 ENCOUNTER — Encounter (HOSPITAL_BASED_OUTPATIENT_CLINIC_OR_DEPARTMENT_OTHER): Payer: Self-pay | Admitting: Emergency Medicine

## 2023-11-11 ENCOUNTER — Other Ambulatory Visit: Payer: Self-pay

## 2023-11-11 ENCOUNTER — Emergency Department (HOSPITAL_BASED_OUTPATIENT_CLINIC_OR_DEPARTMENT_OTHER)
Admission: EM | Admit: 2023-11-11 | Discharge: 2023-11-11 | Disposition: A | Discharge status: Home / Self Care | Payer: BC Managed Care – PPO | Attending: Emergency Medicine | Admitting: Emergency Medicine

## 2023-11-11 DIAGNOSIS — K861 Other chronic pancreatitis: Secondary | ICD-10-CM | POA: Diagnosis not present

## 2023-11-11 DIAGNOSIS — Z91040 Latex allergy status: Secondary | ICD-10-CM | POA: Diagnosis not present

## 2023-11-11 DIAGNOSIS — Z7982 Long term (current) use of aspirin: Secondary | ICD-10-CM | POA: Diagnosis not present

## 2023-11-11 DIAGNOSIS — I10 Essential (primary) hypertension: Secondary | ICD-10-CM | POA: Diagnosis not present

## 2023-11-11 DIAGNOSIS — Z79899 Other long term (current) drug therapy: Secondary | ICD-10-CM | POA: Insufficient documentation

## 2023-11-11 DIAGNOSIS — J45909 Unspecified asthma, uncomplicated: Secondary | ICD-10-CM | POA: Diagnosis not present

## 2023-11-11 DIAGNOSIS — R1084 Generalized abdominal pain: Secondary | ICD-10-CM | POA: Diagnosis present

## 2023-11-11 LAB — COMPREHENSIVE METABOLIC PANEL
ALT: 14 U/L (ref 0–44)
AST: 21 U/L (ref 15–41)
Albumin: 3.8 g/dL (ref 3.5–5.0)
Alkaline Phosphatase: 209 U/L — ABNORMAL HIGH (ref 38–126)
Anion gap: 10 (ref 5–15)
BUN: 19 mg/dL (ref 8–23)
CO2: 27 mmol/L (ref 22–32)
Calcium: 8.8 mg/dL — ABNORMAL LOW (ref 8.9–10.3)
Chloride: 101 mmol/L (ref 98–111)
Creatinine, Ser: 0.87 mg/dL (ref 0.44–1.00)
GFR, Estimated: >60 mL/min (ref >60)
Glucose, Bld: 97 mg/dL (ref 70–99)
Potassium: 3.6 mmol/L (ref 3.5–5.1)
Sodium: 138 mmol/L (ref 135–145)
Total Bilirubin: 0.3 mg/dL (ref 0.0–1.2)
Total Protein: 7.1 g/dL (ref 6.5–8.1)

## 2023-11-11 LAB — LIPASE, BLOOD: Lipase: 81 U/L — ABNORMAL HIGH (ref 11–51)

## 2023-11-11 LAB — CBC WITH DIFFERENTIAL/PLATELET
Abs Immature Granulocytes: 0.02 10*3/uL (ref 0.00–0.07)
Basophils Absolute: 0 10*3/uL (ref 0.0–0.1)
Basophils Relative: 1 %
Eosinophils Absolute: 0.2 10*3/uL (ref 0.0–0.5)
Eosinophils Relative: 3 %
HCT: 33.7 % — ABNORMAL LOW (ref 36.0–46.0)
Hemoglobin: 11 g/dL — ABNORMAL LOW (ref 12.0–15.0)
Immature Granulocytes: 0 %
Lymphocytes Relative: 29 %
Lymphs Abs: 1.9 10*3/uL (ref 0.7–4.0)
MCH: 30.1 pg (ref 26.0–34.0)
MCHC: 32.6 g/dL (ref 30.0–36.0)
MCV: 92.3 fL (ref 80.0–100.0)
Monocytes Absolute: 0.9 10*3/uL (ref 0.1–1.0)
Monocytes Relative: 13 %
Neutro Abs: 3.7 10*3/uL (ref 1.7–7.7)
Neutrophils Relative %: 54 %
Platelets: 606 10*3/uL — ABNORMAL HIGH (ref 150–400)
RBC: 3.65 MIL/uL — ABNORMAL LOW (ref 3.87–5.11)
RDW: 20.4 % — ABNORMAL HIGH (ref 11.5–15.5)
WBC: 6.8 10*3/uL (ref 4.0–10.5)
nRBC: 0 % (ref 0.0–0.2)

## 2023-11-11 LAB — RESP PANEL BY RT-PCR (RSV, FLU A&B, COVID)  RVPGX2
Influenza A by PCR: NEGATIVE
Influenza B by PCR: NEGATIVE
Resp Syncytial Virus by PCR: NEGATIVE
SARS Coronavirus 2 by RT PCR: NEGATIVE

## 2023-11-11 LAB — ETHANOL: Alcohol, Ethyl (B): <10 mg/dL (ref <10)

## 2023-11-11 MED ORDER — OXYCODONE HCL 5 MG PO TABS: 5.0000 mg | ORAL_TABLET | Freq: Four times a day (QID) | ORAL | 0 refills | Status: DC | PRN

## 2023-11-11 MED ORDER — SODIUM CHLORIDE 0.9 % IV BOLUS
1000.0000 mL | Freq: Once | INTRAVENOUS | Status: AC
  Administered 2023-11-11: 1000 mL via INTRAVENOUS

## 2023-11-11 MED ORDER — DICYCLOMINE HCL 10 MG PO CAPS
10.0000 mg | ORAL_CAPSULE | Freq: Once | ORAL | Status: AC
  Administered 2023-11-11: 10 mg via ORAL
  Filled 2023-11-11: qty 1

## 2023-11-11 MED ORDER — ONDANSETRON HCL 4 MG/2ML IJ SOLN
4.0000 mg | Freq: Once | INTRAMUSCULAR | Status: AC
  Administered 2023-11-11: 4 mg via INTRAVENOUS
  Filled 2023-11-11: qty 2

## 2023-11-11 MED ORDER — LIDOCAINE VISCOUS HCL 2 % MT SOLN
15.0000 mL | Freq: Once | OROMUCOSAL | Status: AC
  Administered 2023-11-11: 15 mL via ORAL
  Filled 2023-11-11: qty 15

## 2023-11-11 MED ORDER — MORPHINE SULFATE (PF) 4 MG/ML IV SOLN
4.0000 mg | Freq: Once | INTRAVENOUS | Status: AC
  Administered 2023-11-11: 4 mg via INTRAVENOUS
  Filled 2023-11-11: qty 1

## 2023-11-11 MED ORDER — IOHEXOL 300 MG/ML  SOLN
100.0000 mL | Freq: Once | INTRAMUSCULAR | Status: AC | PRN
  Administered 2023-11-11: 100 mL via INTRAVENOUS

## 2023-11-11 MED ORDER — FAMOTIDINE IN NACL 20-0.9 MG/50ML-% IV SOLN
20.0000 mg | Freq: Once | INTRAVENOUS | Status: AC
  Administered 2023-11-11: 20 mg via INTRAVENOUS
  Filled 2023-11-11: qty 50

## 2023-11-11 MED ORDER — MORPHINE SULFATE (PF) 2 MG/ML IV SOLN
2.0000 mg | Freq: Once | INTRAVENOUS | Status: AC
  Administered 2023-11-11: 2 mg via INTRAVENOUS
  Filled 2023-11-11: qty 1

## 2023-11-11 MED ORDER — DICYCLOMINE HCL 20 MG PO TABS: 20.0000 mg | ORAL_TABLET | Freq: Two times a day (BID) | ORAL | 0 refills | Status: DC

## 2023-11-11 MED ORDER — ALUM & MAG HYDROXIDE-SIMETH 200-200-20 MG/5ML PO SUSP
15.0000 mL | Freq: Once | ORAL | Status: AC
  Administered 2023-11-11: 15 mL via ORAL
  Filled 2023-11-11: qty 30

## 2023-11-11 MED ORDER — HYDROCODONE-ACETAMINOPHEN 5-325 MG PO TABS
1.0000 | ORAL_TABLET | Freq: Once | ORAL | Status: AC
  Administered 2023-11-11: 1 via ORAL
  Filled 2023-11-11: qty 1

## 2023-11-11 MED ORDER — SUCRALFATE 1 G PO TABS: 1.0000 g | ORAL_TABLET | Freq: Three times a day (TID) | ORAL | 0 refills | Status: DC

## 2023-11-11 MED ORDER — PROMETHAZINE HCL 25 MG PO TABS: 25.0000 mg | ORAL_TABLET | Freq: Four times a day (QID) | ORAL | 0 refills | Status: DC | PRN

## 2023-11-11 NOTE — ED Provider Notes (Signed)
[place] EMERGENCY DEPARTMENT AT [facility] Provider Note  CSN: [id number] Arrival date & time: 11/11/23 0944  Chief Complaint(s) Abdominal Pain  HPI Rhonda Espinoza is a 63 y.o. female with past medical history as below, significant for prior alcohol abuse, HLD, HTN, alcohol use pancreatitis, ADD who presents to the ED with complaint of abdominal pain  Patient was admitted 2/6 at [facility] for acute pancreatitis.  She was discharged on 2/9.  Patient reports that she feels like she was discharged too soon.  Began having recurrent abdominal pain over the past 24-48 hrs., nausea but no vomiting.  Has not taken her medication today because she was worried upset her stomach.  She is worried about recurrence of pancreatitis.  Denies any alcohol in the past 2 weeks.  No change in bowel or bladder function.  No vomiting, no BPR or melena.  No rashes.  No fever.  Reviewed documentation from [facility].  She had echocardiogram performed during hospitalization with LVEF 70 -75%, G1 DD.  Received analgesia, discharge in stable condition.  Past Medical History Past Medical History:  Diagnosis Date   ADD (attention deficit disorder)    Alcohol withdrawal seizure (HCC)    Alcohol-induced pancreatitis    Anxiety    Arthritis    Asthma    allergy induced per patient   Disc disorder    Hyperlipidemia    Hypertension    Pneumonia    Patient Active Problem List   Diagnosis Date Noted   Depression with anxiety 11/05/2023   Elevated troponin 11/05/2023   Truncal obesity 11/05/2023   Diarrhea 11/05/2023   Near syncope 11/05/2023   Fatty liver, alcoholic 08/07/2022   Acute pancreatitis 08/06/2022   Acute alcoholic pancreatitis 05/29/2021   Seizures (HCC) 05/18/2021   Hypokalemia 05/18/2021   Acute encephalopathy 05/18/2021   Alcohol withdrawal seizure (HCC) 05/18/2021   ETOH abuse 05/18/2021   Surgery, elective 02/05/2021   History of T12 kyphoplasty 01/24/2019   Hip pain, acute, right  01/16/2019   Acute leg pain, right 01/16/2019   Sciatica of right side without back pain 01/16/2019   Latex precautions, history of latex allergy 11/02/2018   Lesion of right native kidney 08/30/2018   Abnormal MRI, lumbar spine (08/14/2018) 08/23/2018   Lumbar lateral recess stenosis (Left: L2-3) (Bilateral: L3-4) (Right: L4-5) 08/23/2018   Lumbar Grade 1 Retrolisthesis of L2/L3, L3/L4, & L4/L5 08/23/2018   Grade 1 Lumbar Anterolisthesis of L5/S1 08/23/2018   Lumbar facet hypertrophy (Multilevel) (Bilateral) 08/19/2018   Lumbar foraminal stenosis (Left: L2-3, L3-4) (Bilateral: L4-5, L5-S1) 08/19/2018   Lumbar central spinal stenosis 08/19/2018   DDD (degenerative disc disease), lumbar 08/05/2018   Traumatic compression fracture of T12 (<15%) thoracic vertebra, sequela 08/05/2018   Abnormal x-ray of lumbar spine (08/02/2018) 08/05/2018   Disorder of skeletal system 08/05/2018   Pharmacologic therapy 08/05/2018   Problems influencing health status 08/05/2018   Chronic pain syndrome 08/05/2018   Class 1 obesity with body mass index (BMI) of 33.0 to 33.9 in adult 05/24/2018   Seasonal allergic rhinitis due to pollen 02/03/2017   Chronic low back pain (Bilateral) (R>L) 08/04/2016   Long term current use of opiate analgesic 08/04/2016   Long term prescription opiate use 08/04/2016   Opiate use 08/04/2016   Spondylosis without myelopathy or radiculopathy, lumbar region 08/04/2016   ADD (attention deficit disorder) 05/22/2016   GAD (generalized anxiety disorder) 05/22/2016   Hypertension, essential 05/22/2016   Panic attacks 05/22/2016   Essential hypertension 08/30/2015   Back  muscle spasm 01/31/2014   Abnormal LFTs 01/31/2014   Blood glucose elevated 01/31/2014   Insomnia 01/31/2014   Lumbar facet syndrome (Bilateral) (R>L) 01/31/2014   Elevated LFTs 01/31/2014   Avitaminosis D 07/03/2011   Vitamin D deficiency 07/03/2011   HLD (hyperlipidemia) 06/23/2011   Hypertriglyceridemia  06/23/2011   Home Medication(s) Prior to Admission medications   Medication Sig Start Date End Date Taking? Authorizing Provider  dicyclomine (BENTYL) 20 MG tablet Take 1 tablet (20 mg total) by mouth 2 (two) times daily. 11/11/23  Yes Tanda Rockers A, DO  oxyCODONE (ROXICODONE) 5 MG immediate release tablet Take 1 tablet (5 mg total) by mouth every 6 (six) hours as needed for severe pain (pain score 7-10). 11/11/23  Yes Sloan Leiter, DO  promethazine (PHENERGAN) 25 MG tablet Take 1 tablet (25 mg total) by mouth every 6 (six) hours as needed for nausea or vomiting. 11/11/23  Yes Tanda Rockers A, DO  sucralfate (CARAFATE) 1 g tablet Take 1 tablet (1 g total) by mouth with breakfast, with lunch, and with evening meal for 7 days. 11/11/23 11/18/23 Yes Sloan Leiter, DO  acetaminophen (TYLENOL) 500 MG tablet Take 1,000 mg by mouth as needed for mild pain or headache.    [provider]  ALPRAZolam Prudy Feeler) 1 MG tablet Take 1 mg by mouth 3 (three) times daily as needed for sleep or anxiety. 05/23/21   [provider]  amLODipine (NORVASC) 10 MG tablet Take 1 tablet by mouth daily. 05/06/23   [provider]  aspirin EC 81 MG tablet Take 81 mg by mouth in the morning. Swallow whole.    [provider]  atorvastatin (LIPITOR) 40 MG tablet Take 40 mg by mouth daily.  06/12/16   [provider]  cefdinir (OMNICEF) 300 MG capsule Take 1 capsule (300 mg total) by mouth 2 (two) times daily for 3 days. 11/08/23 11/11/23  Charise Killian, MD  Cholecalciferol 1.25 MG (50000 UT) capsule Take 50,000 Units by mouth once a week. 07/28/22   [provider]  desvenlafaxine (PRISTIQ) 100 MG 24 hr tablet Take 100 mg by mouth at bedtime.    [provider]  DULoxetine (CYMBALTA) 60 MG capsule Take 60 mg by mouth daily. 08/31/23   [provider]  fenofibrate 160 MG tablet Take 160 mg by mouth daily.    [provider]  fexofenadine (ALLEGRA) 180 MG  tablet Take 180 mg by mouth daily as needed for allergies or rhinitis.    [provider]  folic acid (FOLVITE) 1 MG tablet Take 1 tablet (1 mg total) by mouth daily. Patient not taking: Reported on 08/07/2022 05/20/21   [name], MD  hyoscyamine (LEVBID) 0.375 MG 12 hr tablet Take 0.375 mg by mouth every 12 (twelve) hours as needed. 11/04/23   [provider]  meloxicam (MOBIC) 7.5 MG tablet Take 7.5 mg by mouth daily. 08/19/23   [provider]  Multiple Vitamins-Minerals (MULTIVITAMIN WITH MINERALS) tablet Take 1 tablet by mouth daily.    [provider]  ondansetron (ZOFRAN) 4 MG tablet Take 4 mg by mouth as needed for nausea or vomiting. 11/24/22   [provider]  ondansetron (ZOFRAN-ODT) 8 MG disintegrating tablet Take 8 mg by mouth every 8 (eight) hours as needed for nausea. 11/04/23   [provider]  pantoprazole (PROTONIX) 40 MG tablet Take 40 mg by mouth 2 (two) times daily.    [provider]  QUEtiapine (SEROQUEL) 100 MG tablet  Take 100 mg by mouth at bedtime. Patient not taking: Reported on 04/29/2023 01/06/23   [provider]  Teriparatide 620 MCG/2.48ML SOPN INJECT 20 MCG UNDER THE SKIN 1 TIME A DAY. DISCARD PEN 28 DAYS AFTER INITIAL USE 03/12/23   [provider]  traMADol (ULTRAM) 50 MG tablet Take 50 mg by mouth every 12 (twelve) hours as needed. Patient not taking: Reported on 11/07/2023    [provider]  traZODone (DESYREL) 100 MG tablet Take 1 tablet by mouth at bedtime. Patient not taking: Reported on 11/07/2023    [provider]  triamcinolone cream (KENALOG) 0.1 % Apply 1 Application topically 2 (two) times daily. 10/02/23 10/01/24  [provider]  zolpidem (AMBIEN CR) 12.5 MG CR tablet Take 12.5 mg by mouth at bedtime. 07/16/16   [provider]                                                                                                                                     Past Surgical History Past Surgical History:  Procedure Laterality Date   CESAREAN SECTION     CHOLECYSTECTOMY  2022   FOOT SURGERY Left    FRACTURE SURGERY Right 07/2020   R wrist    HERNIA REPAIR     KNEE ARTHROSCOPY     KYPHOPLASTY N/A 10/13/2018   Procedure: THORACIC 12 KYPHOPLASTY;  Surgeon: Estill Bamberg, MD;  Location: [facility] OR;  Service: Orthopedics;  Laterality: N/A;   KYPHOPLASTY N/A 02/05/2021   Procedure: LUMBAR ONE  AND LUMBAR FOUR KYPHOPLASTY;  Surgeon: Estill Bamberg, MD;  Location: [facility] OR;  Service: Orthopedics;  Laterality: N/A;   Family History Family History  Problem Relation Age of Onset   Pancreatitis Brother    Seizures Neg Hx     Social History Social History   Tobacco Use   Smoking status: Never   Smokeless tobacco: Never  Vaping Use   Vaping status: Never Used  Substance Use Topics   Alcohol use: Yes    Alcohol/week: 4.0 standard drinks of alcohol    Types: 4 Glasses of wine per week   Drug use: Never   Allergies Latex, Wound dressings, and Valsartan-hydrochlorothiazide  Review of Systems A thorough review of systems was obtained and all systems are negative except as noted in the HPI and PMH.   Physical Exam Vital Signs  I have reviewed the triage vital signs BP (!) 160/67 (BP Location: Right Arm)   Pulse 88   Temp 97.6 F (36.4 C) (Oral)   Resp 18   Ht 5\' 7"  (1.702 m)   Wt 98 kg   LMP 06/30/2011   SpO2 98%   BMI 33.83 kg/m  Physical Exam Vitals and nursing note reviewed.  Constitutional:      General: She is not in acute distress.    Appearance: Normal appearance. She is well-developed. She is obese.  HENT:  Head: Normocephalic and atraumatic.     Right Ear: External ear normal.     Left Ear: External ear normal.     Nose: Nose normal.     Mouth/Throat:     Mouth: Mucous membranes are moist.  Eyes:     General: No scleral icterus.       Right eye: No discharge.        Left eye: No discharge.   Cardiovascular:     Rate and Rhythm: Regular rhythm. Tachycardia present.     Pulses: Normal pulses.     Heart sounds: Normal heart sounds.  Pulmonary:     Effort: Pulmonary effort is normal. No respiratory distress.     Breath sounds: Normal breath sounds. No stridor.  Abdominal:     General: Abdomen is flat. There is no distension.     Palpations: Abdomen is soft.     Tenderness: There is generalized abdominal tenderness. There is no guarding or rebound.  Musculoskeletal:     Cervical back: No rigidity.     Right lower leg: No edema.     Left lower leg: No edema.  Skin:    General: Skin is warm and dry.     Capillary Refill: Capillary refill takes less than 2 seconds.  Neurological:     Mental Status: She is alert.  Psychiatric:        Mood and Affect: Mood normal.        Behavior: Behavior normal. Behavior is cooperative.     ED Results and Treatments Labs (all labs ordered are listed, but only abnormal results are displayed) Labs Reviewed  CBC WITH DIFFERENTIAL/PLATELET - Abnormal; Notable for the following components:      Result Value   RBC 3.65 (*)    Hemoglobin 11.0 (*)    HCT 33.7 (*)    RDW 20.4 (*)    Platelets 606 (*)    All other components within normal limits  COMPREHENSIVE METABOLIC PANEL - Abnormal; Notable for the following components:   Calcium 8.8 (*)    Alkaline Phosphatase 209 (*)    All other components within normal limits  LIPASE, BLOOD - Abnormal; Notable for the following components:   Lipase 81 (*)    All other components within normal limits  RESP PANEL BY RT-PCR (RSV, FLU A&B, COVID)  RVPGX2  ETHANOL  URINALYSIS, ROUTINE W REFLEX MICROSCOPIC  RAPID URINE DRUG SCREEN, HOSP PERFORMED                                                                                                                          Radiology CT ABDOMEN PELVIS W CONTRAST Result Date: 11/11/2023 CLINICAL DATA:  Epigastric pain. Recently admitted for pancreatitis.  EXAM: CT ABDOMEN AND PELVIS WITH CONTRAST TECHNIQUE: Multidetector CT imaging of the abdomen and pelvis was performed using the standard protocol following bolus administration of intravenous contrast. RADIATION DOSE REDUCTION: This exam was performed according to the departmental dose-optimization program which includes  automated exposure control, adjustment of the mA and/or kV according to patient size and/or use of iterative reconstruction technique. CONTRAST:  OMNIPAQUE IOHEXOL 300 MG/ML  SOLN COMPARISON:  CT abdomen/pelvis dated January 13, 2023. FINDINGS: Lower chest: Bibasilar linear atelectasis/scarring. Otherwise, no acute abnormality. Hepatobiliary: No focal liver abnormality is seen. Status post cholecystectomy. No biliary dilatation. Pancreas: There is subtle haziness surrounding the pancreatic uncinate and head. No fluid collection or evidence to suggest necrosis. No ductal dilatation. Spleen: Normal in size without focal abnormality. Adrenals/Urinary Tract: Adrenal glands are unremarkable. 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 No renal calculi or hydronephrosis. Stomach/Bowel: Similar moderate-sized hiatal hernia. Appendix appears normal. No evidence of bowel wall thickening, distention, or inflammatory changes. Vascular/Lymphatic: Abdominal aorta is normal in caliber with atherosclerotic calcification. No enlarged abdominopelvic lymph nodes. Reproductive: Uterus and bilateral adnexa are unremarkable. Other: No abdominopelvic ascites. No intraperitoneal free air. No abdominal wall hernia. Musculoskeletal: No acute osseous abnormality. No suspicious osseous lesion. Prior vertebral augmentation noted at T12, L1, and L4. Redemonstrated similar compression fracture of T11. IMPRESSION: 1. Subtle haziness surrounding the pancreatic uncinate and head may represent sequela of recent acute uncomplicated pancreatitis. No fluid collection or evidence to suggest necrosis. 2.  Stable moderate-sized hiatal hernia. Aortic Atherosclerosis (ICD10-I70.0). Electronically Signed   By: Hart Robinsons M.D.   On: 11/11/2023 13:42    Pertinent labs & imaging results that were available during my care of the patient were reviewed by me and considered in my medical decision making (see MDM for details).  Medications Ordered in ED Medications  famotidine (PEPCID) IVPB 20 mg premix (20 mg Intravenous New Bag/Given 11/11/23 1543)  sodium chloride 0.9 % bolus 1,000 mL (0 mLs Intravenous Stopped 11/11/23 1215)  morphine (PF) 4 MG/ML injection 4 mg (4 mg Intravenous Given 11/11/23 1106)  ondansetron (ZOFRAN) injection 4 mg (4 mg Intravenous Given 11/11/23 1105)  alum & mag hydroxide-simeth (MAALOX/MYLANTA) 200-200-20 MG/5ML suspension 15 mL (15 mLs Oral Given 11/11/23 1113)    And  lidocaine (XYLOCAINE) 2 % viscous mouth solution 15 mL (15 mLs Oral Given 11/11/23 1113)  iohexol (OMNIPAQUE) 300 MG/ML solution 100 mL (100 mLs Intravenous Contrast Given 11/11/23 1135)  HYDROcodone-acetaminophen (NORCO/VICODIN) 5-325 MG per tablet 1 tablet (1 tablet Oral Given 11/11/23 1407)  morphine (PF) 2 MG/ML injection 2 mg (2 mg Intravenous Given 11/11/23 1541)  dicyclomine (BENTYL) capsule 10 mg (10 mg Oral Given 11/11/23 1540)                                                                                                                                     Procedures Procedures  (including critical care time)  Medical Decision Making / ED Course    Medical Decision Making:    TAMARI REDWINE is a 63 y.o. female  with past medical history as below, significant for prior alcohol abuse, HLD, HTN, alcohol  use pancreatitis, ADD who presents to the ED with complaint of abdominal pain. The complaint involves an extensive differential diagnosis and also carries with it a high risk of complications and morbidity.  Serious etiology was considered. Ddx includes but is not limited to: Differential diagnosis  includes but is not exclusive to acute cholecystitis, intrathoracic causes for epigastric abdominal pain, gastritis, duodenitis, pancreatitis, small bowel or large bowel obstruction, abdominal aortic aneurysm, hernia, gastritis, etc.   Complete initial physical exam performed, notably the patient was in no acute distress, heart rate mildly elevated at 101.  Abdomen nonperitoneal.   No jaundice no fever Reviewed and confirmed nursing documentation for past medical history, family history, social history.  Vital signs reviewed.   Clinical Course as of 11/11/23 1601  Wed Nov 11, 2023  1359 CT stable Pain improved  [SG]    Clinical Course User Index [SG] Sloan Leiter, DO    Brief summary: 63 year old female history above including chronic alcohol abuse and alcohol induced pancreatitis here with abdominal pain consistent with prior episodes of pancreatitis.  Nausea no vomiting.  Get screening labs, get CT imaging abdomen pelvis to rule out acute intra-abdominal pathology.  Give algesia, antiemetic, fluids.  Reviewed documentation from [facility].  She had echocardiogram performed during hospitalization with LVEF 70 -75%, G1 DD.  Received analgesia, discharge in stable condition.  Labs and imaging today are reassuring, her exam is reassuring.  She is feeling better on recheck.  Does not appear to require admission at this point. Discussed supportive care at home regards to her improving pancreatitis.  Encouraged follow-up with PCP.  Rehydration.  Slowly increase her diet.  Antiemetic for home.  The patient improved significantly and was discharged in stable condition. Detailed discussions were had with the patient/guardian regarding current findings, and need for close f/u with PCP or on call doctor. The patient/guardian has been instructed to return immediately if the symptoms worsen in any way for re-evaluation. Patient/guardian verbalized understanding and is in agreement with current care plan. All  questions answered prior to discharge.           Additional history obtained: -Additional history obtained from na -External records from outside source obtained and reviewed including: Chart review including previous notes, labs, imaging, consultation notes including  Prior medications, prior labs and imaging, recent admission, recent ER visits, recent echocardiogram   Lab Tests: -I ordered, reviewed, and interpreted labs.   The pertinent results include:   Labs Reviewed  CBC WITH DIFFERENTIAL/PLATELET - Abnormal; Notable for the following components:      Result Value   RBC 3.65 (*)    Hemoglobin 11.0 (*)    HCT 33.7 (*)    RDW 20.4 (*)    Platelets 606 (*)    All other components within normal limits  COMPREHENSIVE METABOLIC PANEL - Abnormal; Notable for the following components:   Calcium 8.8 (*)    Alkaline Phosphatase 209 (*)    All other components within normal limits  LIPASE, BLOOD - Abnormal; Notable for the following components:   Lipase 81 (*)    All other components within normal limits  RESP PANEL BY RT-PCR (RSV, FLU A&B, COVID)  RVPGX2  ETHANOL  URINALYSIS, ROUTINE W REFLEX MICROSCOPIC  RAPID URINE DRUG SCREEN, HOSP PERFORMED    Notable for labs stable, improving  EKG   EKG Interpretation Date/Time:    Ventricular Rate:    PR Interval:    QRS Duration:    QT Interval:  QTC Calculation:   R Axis:      Text Interpretation:           Imaging Studies ordered: I ordered imaging studies including CTAP I independently visualized the following imaging with scope of interpretation limited to determining acute life threatening conditions related to emergency care; findings noted above I independently visualized and interpreted imaging. I agree with the radiologist interpretation   Medicines ordered and prescription drug management: Meds ordered this encounter  Medications   sodium chloride 0.9 % bolus 1,000 mL   morphine (PF) 4 MG/ML  injection 4 mg   ondansetron (ZOFRAN) injection 4 mg   AND Linked Order Group    alum & mag hydroxide-simeth (MAALOX/MYLANTA) 200-200-20 MG/5ML suspension 15 mL    lidocaine (XYLOCAINE) 2 % viscous mouth solution 15 mL   iohexol (OMNIPAQUE) 300 MG/ML solution 100 mL   HYDROcodone-acetaminophen (NORCO/VICODIN) 5-325 MG per tablet 1 tablet    Refill:  0   morphine (PF) 2 MG/ML injection 2 mg   famotidine (PEPCID) IVPB 20 mg premix   oxyCODONE (ROXICODONE) 5 MG immediate release tablet    Sig: Take 1 tablet (5 mg total) by mouth every 6 (six) hours as needed for severe pain (pain score 7-10).    Dispense:  10 tablet    Refill:  0   promethazine (PHENERGAN) 25 MG tablet    Sig: Take 1 tablet (25 mg total) by mouth every 6 (six) hours as needed for nausea or vomiting.    Dispense:  12 tablet    Refill:  0   dicyclomine (BENTYL) 20 MG tablet    Sig: Take 1 tablet (20 mg total) by mouth 2 (two) times daily.    Dispense:  20 tablet    Refill:  0   dicyclomine (BENTYL) capsule 10 mg   sucralfate (CARAFATE) 1 g tablet    Sig: Take 1 tablet (1 g total) by mouth with breakfast, with lunch, and with evening meal for 7 days.    Dispense:  21 tablet    Refill:  0    -I have reviewed the patients home medicines and have made adjustments as needed   Consultations Obtained: na   Cardiac Monitoring: The patient was maintained on a cardiac monitor.  I personally viewed and interpreted the cardiac monitored which showed an underlying rhythm of: NSR Continuous pulse oximetry interpreted by myself, 100% on RA.    Social Determinants of Health:  Diagnosis or treatment significantly limited by social determinants of health: alcohol use   Reevaluation: After the interventions noted above, I reevaluated the patient and found that they have improved  Co morbidities that complicate the patient evaluation  Past Medical History:  Diagnosis Date   ADD (attention deficit disorder)    Alcohol  withdrawal seizure (HCC)    Alcohol-induced pancreatitis    Anxiety    Arthritis    Asthma    allergy induced per patient   Disc disorder    Hyperlipidemia    Hypertension    Pneumonia       Dispostion: Disposition decision including need for hospitalization was considered, and patient discharged from emergency department.    Final Clinical Impression(s) / ED Diagnoses Final diagnoses:  Chronic pancreatitis, unspecified pancreatitis type (HCC)        Sloan Leiter, DO 11/11/23 1601

## 2023-11-11 NOTE — ED Triage Notes (Signed)
C/o ABD pain, n/v/d, and fevers x 1 weeks. Recently admitted for pancreatitis and d/c on Sunday. Patient states she feels like she was discharged "too early"

## 2023-11-11 NOTE — ED Notes (Signed)
Discharge paperwork given and verbally understood.... Pt AO x4, Pt aware of no drinking/driving due to the meds that were given.Marland KitchenMarland Kitchen

## 2023-11-11 NOTE — Discharge Instructions (Addendum)
It was a pleasure caring for you today in the emergency department.  Please return to the emergency department for any worsening or worrisome symptoms.

## 2023-11-11 NOTE — ED Notes (Signed)
Pt aware of the need for a urine... Unable to currently provide the sample.Marland KitchenMarland Kitchen

## 2023-12-20 ENCOUNTER — Emergency Department

## 2023-12-20 ENCOUNTER — Inpatient Hospital Stay
Admission: EM | Admit: 2023-12-20 | Discharge: 2023-12-22 | DRG: 897 | Disposition: A | Discharge status: Home / Self Care | Attending: Internal Medicine | Admitting: Internal Medicine

## 2023-12-20 ENCOUNTER — Other Ambulatory Visit: Payer: Self-pay

## 2023-12-20 DIAGNOSIS — R112 Nausea with vomiting, unspecified: Secondary | ICD-10-CM | POA: Diagnosis present

## 2023-12-20 DIAGNOSIS — Z91040 Latex allergy status: Secondary | ICD-10-CM

## 2023-12-20 DIAGNOSIS — R519 Headache, unspecified: Secondary | ICD-10-CM | POA: Diagnosis present

## 2023-12-20 DIAGNOSIS — E785 Hyperlipidemia, unspecified: Secondary | ICD-10-CM | POA: Diagnosis present

## 2023-12-20 DIAGNOSIS — G4089 Other seizures: Secondary | ICD-10-CM | POA: Diagnosis present

## 2023-12-20 DIAGNOSIS — F10239 Alcohol dependence with withdrawal, unspecified: Principal | ICD-10-CM | POA: Diagnosis present

## 2023-12-20 DIAGNOSIS — E876 Hypokalemia: Secondary | ICD-10-CM | POA: Diagnosis present

## 2023-12-20 DIAGNOSIS — F10939 Alcohol use, unspecified with withdrawal, unspecified: Secondary | ICD-10-CM

## 2023-12-20 DIAGNOSIS — J45909 Unspecified asthma, uncomplicated: Secondary | ICD-10-CM | POA: Diagnosis present

## 2023-12-20 DIAGNOSIS — F1010 Alcohol abuse, uncomplicated: Secondary | ICD-10-CM | POA: Diagnosis present

## 2023-12-20 DIAGNOSIS — F418 Other specified anxiety disorders: Secondary | ICD-10-CM | POA: Diagnosis present

## 2023-12-20 DIAGNOSIS — R569 Unspecified convulsions: Secondary | ICD-10-CM | POA: Diagnosis not present

## 2023-12-20 DIAGNOSIS — F10930 Alcohol use, unspecified with withdrawal, uncomplicated: Secondary | ICD-10-CM

## 2023-12-20 DIAGNOSIS — I10 Essential (primary) hypertension: Secondary | ICD-10-CM | POA: Diagnosis present

## 2023-12-20 DIAGNOSIS — Z888 Allergy status to other drugs, medicaments and biological substances status: Secondary | ICD-10-CM

## 2023-12-20 DIAGNOSIS — R109 Unspecified abdominal pain: Secondary | ICD-10-CM | POA: Diagnosis present

## 2023-12-20 DIAGNOSIS — Z79899 Other long term (current) drug therapy: Secondary | ICD-10-CM

## 2023-12-20 DIAGNOSIS — Z791 Long term (current) use of non-steroidal anti-inflammatories (NSAID): Secondary | ICD-10-CM

## 2023-12-20 DIAGNOSIS — Z9049 Acquired absence of other specified parts of digestive tract: Secondary | ICD-10-CM

## 2023-12-20 DIAGNOSIS — F419 Anxiety disorder, unspecified: Secondary | ICD-10-CM | POA: Diagnosis present

## 2023-12-20 LAB — COMPREHENSIVE METABOLIC PANEL
ALT: 22 U/L (ref 0–44)
AST: 37 U/L (ref 15–41)
Albumin: 4.4 g/dL (ref 3.5–5.0)
Alkaline Phosphatase: 92 U/L (ref 38–126)
Anion gap: 14 (ref 5–15)
BUN: 9 mg/dL (ref 8–23)
CO2: 19 mmol/L — ABNORMAL LOW (ref 22–32)
Calcium: 9.1 mg/dL (ref 8.9–10.3)
Chloride: 101 mmol/L (ref 98–111)
Creatinine, Ser: 0.84 mg/dL (ref 0.44–1.00)
GFR, Estimated: >60 mL/min (ref >60)
Glucose, Bld: 155 mg/dL — ABNORMAL HIGH (ref 70–99)
Potassium: 3.2 mmol/L — ABNORMAL LOW (ref 3.5–5.1)
Sodium: 134 mmol/L — ABNORMAL LOW (ref 135–145)
Total Bilirubin: 1.1 mg/dL (ref 0.0–1.2)
Total Protein: 8.1 g/dL (ref 6.5–8.1)

## 2023-12-20 LAB — CBC WITH DIFFERENTIAL/PLATELET
Abs Immature Granulocytes: 0.02 10*3/uL (ref 0.00–0.07)
Basophils Absolute: 0.1 10*3/uL (ref 0.0–0.1)
Basophils Relative: 1 %
Eosinophils Absolute: 0 10*3/uL (ref 0.0–0.5)
Eosinophils Relative: 0 %
HCT: 34.6 % — ABNORMAL LOW (ref 36.0–46.0)
Hemoglobin: 11.7 g/dL — ABNORMAL LOW (ref 12.0–15.0)
Immature Granulocytes: 0 %
Lymphocytes Relative: 18 %
Lymphs Abs: 1.5 10*3/uL (ref 0.7–4.0)
MCH: 30 pg (ref 26.0–34.0)
MCHC: 33.8 g/dL (ref 30.0–36.0)
MCV: 88.7 fL (ref 80.0–100.0)
Monocytes Absolute: 0.6 10*3/uL (ref 0.1–1.0)
Monocytes Relative: 7 %
Neutro Abs: 6.1 10*3/uL (ref 1.7–7.7)
Neutrophils Relative %: 74 %
Platelets: 410 10*3/uL — ABNORMAL HIGH (ref 150–400)
RBC: 3.9 MIL/uL (ref 3.87–5.11)
RDW: 16.6 % — ABNORMAL HIGH (ref 11.5–15.5)
WBC: 8.2 10*3/uL (ref 4.0–10.5)
nRBC: 0 % (ref 0.0–0.2)

## 2023-12-20 LAB — MAGNESIUM: Magnesium: 1.9 mg/dL (ref 1.7–2.4)

## 2023-12-20 LAB — TROPONIN I (HIGH SENSITIVITY): Troponin I (High Sensitivity): 6 ng/L (ref <18)

## 2023-12-20 MED ORDER — PHENOBARBITAL 32.4 MG PO TABS: 32.4000 mg | ORAL_TABLET | Freq: Three times a day (TID) | ORAL | Status: DC

## 2023-12-20 MED ORDER — ADULT MULTIVITAMIN W/MINERALS CH
1.0000 | ORAL_TABLET | Freq: Every day | ORAL | Status: DC
  Administered 2023-12-21 – 2023-12-22 (×2): 1 via ORAL
  Filled 2023-12-20 (×2): qty 1

## 2023-12-20 MED ORDER — PHENOBARBITAL 32.4 MG PO TABS: 64.8000 mg | ORAL_TABLET | Freq: Three times a day (TID) | ORAL | Status: DC

## 2023-12-20 MED ORDER — LORAZEPAM 2 MG/ML IJ SOLN: 1.0000 mg | INTRAMUSCULAR | Status: DC | PRN

## 2023-12-20 MED ORDER — PHENOBARBITAL 32.4 MG PO TABS
97.2000 mg | ORAL_TABLET | Freq: Three times a day (TID) | ORAL | Status: DC
Start: 2023-12-21 — End: 2023-12-22
  Administered 2023-12-21 – 2023-12-22 (×5): 97.2 mg via ORAL
  Filled 2023-12-20 (×5): qty 3

## 2023-12-20 MED ORDER — FOLIC ACID 1 MG PO TABS
1.0000 mg | ORAL_TABLET | Freq: Every day | ORAL | Status: DC
  Administered 2023-12-21 – 2023-12-22 (×2): 1 mg via ORAL
  Filled 2023-12-20 (×2): qty 1

## 2023-12-20 MED ORDER — POTASSIUM CHLORIDE 10 MEQ/100ML IV SOLN
10.0000 meq | Freq: Once | INTRAVENOUS | Status: AC
  Administered 2023-12-21: 10 meq via INTRAVENOUS
  Filled 2023-12-20: qty 100

## 2023-12-20 MED ORDER — FAMOTIDINE IN NACL 20-0.9 MG/50ML-% IV SOLN
20.0000 mg | Freq: Once | INTRAVENOUS | Status: AC
  Administered 2023-12-20: 20 mg via INTRAVENOUS
  Filled 2023-12-20: qty 50

## 2023-12-20 MED ORDER — SODIUM CHLORIDE 0.9 % IV BOLUS
1000.0000 mL | Freq: Once | INTRAVENOUS | Status: AC
  Administered 2023-12-20: 1000 mL via INTRAVENOUS

## 2023-12-20 MED ORDER — ONDANSETRON HCL 4 MG/2ML IJ SOLN
4.0000 mg | Freq: Once | INTRAMUSCULAR | Status: AC
  Administered 2023-12-20: 4 mg via INTRAVENOUS
  Filled 2023-12-20: qty 2

## 2023-12-20 MED ORDER — THIAMINE HCL 100 MG PO TABS
100.0000 mg | ORAL_TABLET | Freq: Every day | ORAL | Status: DC
  Administered 2023-12-21 – 2023-12-22 (×2): 100 mg via ORAL
  Filled 2023-12-20 (×4): qty 1

## 2023-12-20 MED ORDER — LORAZEPAM 2 MG/ML IJ SOLN
0.0000 mg | Freq: Four times a day (QID) | INTRAMUSCULAR | Status: DC
  Administered 2023-12-20 – 2023-12-21 (×2): 1 mg via INTRAVENOUS
  Filled 2023-12-20 (×2): qty 1

## 2023-12-20 NOTE — ED Provider Notes (Signed)
 [facility] Provider Note    Event Date/Time   First MD Initiated Contact with Patient 12/20/23 2301     (approximate)   History   Seizures   HPI  Rhonda Espinoza is a 63 y.o. female brought to the ED via EMS from home with a chief complaint of seizure.  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  Episode lasted 2 to 3 minutes.  States patient was extremely scared afterwards.  Patient did not bite her tongue or suffer urinary incontinence.  Complains of headache and nausea currently.  Endorses several drinks of liquor daily.  Last drink approximately 24 hours ago.  States has been vomiting all day.  Denies fever/chills, chest pain, shortness of breath, diarrhea.     Past Medical History   Past Medical History:  Diagnosis Date   ADD (attention deficit disorder)    Alcohol withdrawal seizure (HCC)    Alcohol-induced pancreatitis    Anxiety    Arthritis    Asthma    allergy induced per patient   Disc disorder    Hyperlipidemia    Hypertension    Pneumonia      Active Problem List   Patient Active Problem List   Diagnosis Date Noted   Depression with anxiety 11/05/2023   Elevated troponin 11/05/2023   Truncal obesity 11/05/2023   Diarrhea 11/05/2023   Near syncope 11/05/2023   Fatty liver, alcoholic 08/07/2022   Acute pancreatitis 08/06/2022   Acute alcoholic pancreatitis 05/29/2021   Seizures (HCC) 05/18/2021   Hypokalemia 05/18/2021   Acute encephalopathy 05/18/2021   Alcohol withdrawal seizure (HCC) 05/18/2021   ETOH abuse 05/18/2021   Surgery, elective 02/05/2021   History of T12 kyphoplasty 01/24/2019   Hip  pain, acute, right 01/16/2019   Acute leg pain, right 01/16/2019   Sciatica of right side without back pain 01/16/2019   Latex precautions, history of latex allergy 11/02/2018   Lesion of right native kidney 08/30/2018   Abnormal MRI, lumbar spine (08/14/2018) 08/23/2018   Lumbar lateral recess stenosis (Left: L2-3) (Bilateral: L3-4) (Right: L4-5) 08/23/2018   Lumbar Grade 1 Retrolisthesis of L2/L3, L3/L4, & L4/L5 08/23/2018   Grade 1 Lumbar Anterolisthesis of L5/S1 08/23/2018   Lumbar facet hypertrophy (Multilevel) (Bilateral) 08/19/2018   Lumbar foraminal stenosis (Left: L2-3, L3-4) (Bilateral: L4-5, L5-S1) 08/19/2018   Lumbar central spinal stenosis 08/19/2018   DDD (degenerative disc disease), lumbar 08/05/2018   Traumatic compression fracture of T12 (<15%) thoracic vertebra, sequela 08/05/2018   Abnormal x-ray of lumbar spine (08/02/2018) 08/05/2018   Disorder of skeletal system 08/05/2018   Pharmacologic therapy 08/05/2018   Problems influencing health status 08/05/2018   Chronic pain syndrome 08/05/2018   Class 1 obesity with body mass index (BMI) of 33.0 to 33.9 in adult 05/24/2018   Seasonal allergic rhinitis due to pollen 02/03/2017   Chronic low back pain (Bilateral) (R>L) 08/04/2016   Long term current use of opiate analgesic 08/04/2016   Long term prescription opiate use 08/04/2016   Opiate use 08/04/2016   Spondylosis without myelopathy or radiculopathy, lumbar region 08/04/2016   ADD (attention deficit disorder) 05/22/2016   GAD (generalized anxiety disorder) 05/22/2016  Hypertension, essential 05/22/2016   Panic attacks 05/22/2016   Essential hypertension 08/30/2015   Back muscle spasm 01/31/2014   Abnormal LFTs 01/31/2014   Blood glucose elevated 01/31/2014   Insomnia 01/31/2014   Lumbar facet syndrome (Bilateral) (R>L) 01/31/2014   Elevated LFTs 01/31/2014   Avitaminosis D 07/03/2011   Vitamin D deficiency 07/03/2011   HLD (hyperlipidemia) 06/23/2011    Hypertriglyceridemia 06/23/2011     Past Surgical History   Past Surgical History:  Procedure Laterality Date   CESAREAN SECTION     CHOLECYSTECTOMY  2022   FOOT SURGERY Left    FRACTURE SURGERY Right 07/2020   R wrist    HERNIA REPAIR     KNEE ARTHROSCOPY     KYPHOPLASTY N/A 10/13/2018   Procedure: THORACIC 12 KYPHOPLASTY;  Surgeon: Estill Bamberg, MD;  Location: [facility] OR;  Service: Orthopedics;  Laterality: N/A;   KYPHOPLASTY N/A 02/05/2021   Procedure: LUMBAR ONE  AND LUMBAR FOUR KYPHOPLASTY;  Surgeon: Estill Bamberg, MD;  Location: [facility] OR;  Service: Orthopedics;  Laterality: N/A;     Home Medications   Prior to Admission medications   Medication Sig Start Date End Date Taking? Authorizing Provider  acetaminophen (TYLENOL) 500 MG tablet Take 1,000 mg by mouth as needed for mild pain or headache.    [provider]  ALPRAZolam Prudy Feeler) 1 MG tablet Take 1 mg by mouth 3 (three) times daily as needed for sleep or anxiety. 05/23/21   [provider]  amLODipine (NORVASC) 10 MG tablet Take 1 tablet by mouth daily. 05/06/23   [provider]  aspirin EC 81 MG tablet Take 81 mg by mouth in the morning. Swallow whole.    [provider]  atorvastatin (LIPITOR) 40 MG tablet Take 40 mg by mouth daily.  06/12/16   [provider]  Cholecalciferol 1.25 MG (50000 UT) capsule Take 50,000 Units by mouth once a week. 07/28/22   [provider]  desvenlafaxine (PRISTIQ) 100 MG 24 hr tablet Take 100 mg by mouth at bedtime.    [provider]  dicyclomine (BENTYL) 20 MG tablet Take 1 tablet (20 mg total) by mouth 2 (two) times daily. 11/11/23   Sloan Leiter, DO  DULoxetine (CYMBALTA) 60 MG capsule Take 60 mg by mouth daily. 08/31/23   [provider]  fenofibrate 160 MG tablet Take 160 mg by mouth daily.    [provider]  fexofenadine (ALLEGRA) 180 MG tablet Take 180 mg by mouth daily as needed for allergies or rhinitis.     [provider]  folic acid (FOLVITE) 1 MG tablet Take 1 tablet (1 mg total) by mouth daily. Patient not taking: Reported on 08/07/2022 05/20/21   [name], MD  hyoscyamine (LEVBID) 0.375 MG 12 hr tablet Take 0.375 mg by mouth every 12 (twelve) hours as needed. 11/04/23   [provider]  meloxicam (MOBIC) 7.5 MG tablet Take 7.5 mg by mouth daily. 08/19/23   [provider]  Multiple Vitamins-Minerals (MULTIVITAMIN WITH MINERALS) tablet Take 1 tablet by mouth daily.    [provider]  ondansetron (ZOFRAN) 4 MG tablet Take 4 mg by mouth as needed for nausea or vomiting. 11/24/22   [provider]  ondansetron (ZOFRAN-ODT) 8 MG disintegrating tablet Take 8 mg by mouth every 8 (eight) hours as needed for nausea. 11/04/23   [provider]  oxyCODONE (ROXICODONE) 5 MG immediate release tablet Take 1 tablet (5 mg total) by mouth every 6 (six) hours as  needed for severe pain (pain score 7-10). 11/11/23   Tanda Rockers A, DO  pantoprazole (PROTONIX) 40 MG tablet Take 40 mg by mouth 2 (two) times daily.    [provider]  promethazine (PHENERGAN) 25 MG tablet Take 1 tablet (25 mg total) by mouth every 6 (six) hours as needed for nausea or vomiting. 11/11/23   Tanda Rockers A, DO  QUEtiapine (SEROQUEL) 100 MG tablet Take 100 mg by mouth at bedtime. Patient not taking: Reported on 04/29/2023 01/06/23   [provider]  sucralfate (CARAFATE) 1 g tablet Take 1 tablet (1 g total) by mouth with breakfast, with lunch, and with evening meal for 7 days. 11/11/23 11/18/23  Sloan Leiter, DO  Teriparatide 620 MCG/2.48ML SOPN INJECT 20 MCG UNDER THE SKIN 1 TIME A DAY. DISCARD PEN 28 DAYS AFTER INITIAL USE 03/12/23   [provider]  traMADol (ULTRAM) 50 MG tablet Take 50 mg by mouth every 12 (twelve) hours as needed. Patient not taking: Reported on 11/07/2023    [provider]  traZODone (DESYREL) 100 MG tablet Take 1 tablet by mouth at  bedtime. Patient not taking: Reported on 11/07/2023    [provider]  triamcinolone cream (KENALOG) 0.1 % Apply 1 Application topically 2 (two) times daily. 10/02/23 10/01/24  [provider]  zolpidem (AMBIEN CR) 12.5 MG CR tablet Take 12.5 mg by mouth at bedtime. 07/16/16   [provider]     Allergies  Latex, Wound dressings, and Valsartan-hydrochlorothiazide   Family History   Family History  Problem Relation Age of Onset   Pancreatitis Brother    Seizures Neg Hx      Physical Exam  Triage Vital Signs: ED Triage Vitals  Encounter Vitals Group     BP 12/20/23 2245 (!) 200/91     Systolic BP Percentile --      Diastolic BP Percentile --      Pulse Rate 12/20/23 2245 98     Resp 12/20/23 2251 10     Temp 12/20/23 2245 98.2 F (36.8 C)     Temp Source 12/20/23 2245 Oral     SpO2 12/20/23 2251 99 %     Weight --      Height --      Head Circumference --      Peak Flow --      Pain Score 12/20/23 2244 10     Pain Loc --      Pain Education --      Exclude from Growth Chart --     Updated Vital Signs: BP (!) 176/107   Pulse (!) 102   Temp 98.2 F (36.8 C) (Oral)   Resp 10   LMP 06/30/2011   SpO2 99%    General: Awake, mild distress.  Mildly dry mucous membranes. CV:  RRR.  Good peripheral perfusion.  Resp:  Normal effort.  CTAB. Abd:  Nontender.  No distention.  Other:  PERRL.  EOMI. Alert and oriented to person and place.  CN II-XII grossly intact.  5/5 motor strength and sensation all extremities.  M AE x 4.   ED Results / Procedures / Treatments  Labs (all labs ordered are listed, but only abnormal results are displayed) Labs Reviewed  CBC WITH DIFFERENTIAL/PLATELET - Abnormal; Notable for the following components:      Result Value   Hemoglobin 11.7 (*)    HCT 34.6 (*)    RDW 16.6 (*)    Platelets 410 (*)  All other components within normal limits  COMPREHENSIVE METABOLIC PANEL - Abnormal; Notable for the following  components:   Sodium 134 (*)    Potassium 3.2 (*)    CO2 19 (*)    Glucose, Bld 155 (*)    All other components within normal limits  MAGNESIUM  URINALYSIS, W/ REFLEX TO CULTURE (INFECTION SUSPECTED)  URINE DRUG SCREEN, QUALITATIVE ([facility] ONLY)  ETHANOL  TROPONIN I (HIGH SENSITIVITY)     EKG  ED ECG REPORT I, Amaal Dimartino J, the attending physician, personally viewed and interpreted this ECG.   Date: 12/20/2023  EKG Time: 2246  Rate: 99  Rhythm: normal sinus rhythm  Axis: Normal  Intervals:none  ST&T Change: Nonspecific    RADIOLOGY I have independently visualized and interpreted patient's imaging studies as well as noted the radiology interpretation:  CT head: No ICH  Chest x-ray: No acute cardiopulmonary process  Official radiology report(s): DG Chest Port 1 View Result Date: 12/20/2023 CLINICAL DATA:  Seizure EXAM: PORTABLE CHEST 1 VIEW COMPARISON:  11/05/2023 FINDINGS: Low lung volumes. Mild cardiomegaly. No acute airspace disease, pleural effusion or pneumothorax IMPRESSION: No active disease. Low lung volumes. Mild cardiomegaly. Electronically Signed   By: Jasmine Pang M.D.   On: 12/20/2023 23:31   CT Head Wo Contrast Result Date: 12/20/2023 CLINICAL DATA:  New onset seizure activity EXAM: CT HEAD WITHOUT CONTRAST TECHNIQUE: Contiguous axial images were obtained from the base of the skull through the vertex without intravenous contrast. RADIATION DOSE REDUCTION: This exam was performed according to the departmental dose-optimization program which includes automated exposure control, adjustment of the mA and/or kV according to patient size and/or use of iterative reconstruction technique. COMPARISON:  11/02/2023 FINDINGS: Brain: No evidence of acute infarction, hemorrhage, hydrocephalus, extra-axial collection or mass lesion/mass effect. Vascular: No hyperdense vessel or unexpected calcification. Skull: Normal. Negative for fracture or focal lesion. Sinuses/Orbits: No acute  finding. Other: None. IMPRESSION: No acute intracranial abnormality noted. Electronically Signed   By: Alcide Clever M.D.   On: 12/20/2023 23:29     PROCEDURES:  Critical Care performed: Yes, see critical care procedure note(s)  CRITICAL CARE Performed by: Irean Hong   Total critical care time: 30 minutes  Critical care time was exclusive of separately billable procedures and treating other patients.  Critical care was necessary to treat or prevent imminent or life-threatening deterioration.  Critical care was time spent personally by me on the following activities: development of treatment plan with patient and/or surrogate as well as nursing, discussions with consultants, evaluation of patient's response to treatment, examination of patient, obtaining history from patient or surrogate, ordering and performing treatments and interventions, ordering and review of laboratory studies, ordering and review of radiographic studies, pulse oximetry and re-evaluation of patient's condition.   Marland Kitchen1-3 Lead EKG Interpretation  Performed by: Irean Hong, MD Authorized by: Irean Hong, MD     Interpretation: normal     ECG rate:  98   ECG rate assessment: normal     Rhythm: sinus rhythm     Ectopy: none     Conduction: normal   Comments:     Patient placed on cardiac monitor to evaluate for arrhythmias    MEDICATIONS ORDERED IN ED: Medications  thiamine (VITAMIN B1) tablet 100 mg (has no administration in time range)  LORazepam (ATIVAN) injection 0-4 mg (1 mg Intravenous Given 12/20/23 2340)  famotidine (PEPCID) IVPB 20 mg premix (20 mg Intravenous New Bag/Given 12/20/23 2350)  folic acid (FOLVITE) tablet 1 mg (  has no administration in time range)  multivitamin with minerals tablet 1 tablet (has no administration in time range)  LORazepam (ATIVAN) injection 1 mg (has no administration in time range)  PHENobarbital (LUMINAL) tablet 97.2 mg (has no administration in time range)     Followed by  PHENobarbital (LUMINAL) tablet 64.8 mg (has no administration in time range)    Followed by  PHENobarbital (LUMINAL) tablet 32.4 mg (has no administration in time range)  potassium chloride 10 mEq in 100 mL IVPB (has no administration in time range)  ondansetron (ZOFRAN) injection 4 mg (4 mg Intravenous Given 12/20/23 2344)  sodium chloride 0.9 % bolus 1,000 mL (1,000 mLs Intravenous New Bag/Given 12/20/23 2343)     IMPRESSION / MDM / ASSESSMENT AND PLAN / ED COURSE  I reviewed the triage vital signs and the nursing notes.                             63 year old female presenting with seizure.  Differential diagnosis includes but is not limited to CVA, ICH, brain mass, metabolic, infectious, alcohol withdrawal etiologies, etc.  I personally reviewed patient's records and note her hospitalization from 05/18/2021 for seizure; she was seen by neurology and felt to have alcohol withdrawal seizures and therefore not started on antiepileptic.  Patient's presentation is most consistent with acute presentation with potential threat to life or bodily function.  The patient is on the cardiac monitor to evaluate for evidence of arrhythmia and/or significant heart rate changes.  Will obtain lab work, CT head, place on CIWA scale.  Initiate IV fluids, Zofran for nausea, Pepcid for gastritis.  Will reassess.  Clinical Course as of 12/21/23 0523  Mon Dec 21, 2023  0030 Updated patient and spouse on all laboratory and imaging results.  Will consult hospital services for evaluation and admission. [JS]    Clinical Course User Index [JS] Irean Hong, MD     FINAL CLINICAL IMPRESSION(S) / ED DIAGNOSES   Final diagnoses:  Seizure (HCC)  Alcohol withdrawal syndrome without complication (HCC)  Alcohol withdrawal seizure without complication (HCC)  Hypokalemia     Rx / DC Orders   ED Discharge Orders     None        Note:  This document was prepared using Dragon voice  recognition software and may include unintentional dictation errors.   Irean Hong, MD 12/21/23 [phone]

## 2023-12-20 NOTE — ED Provider Notes (Incomplete)
 [facility] Provider Note    Event Date/Time   First MD Initiated Contact with Patient 12/20/23 2301     (approximate)   History   Seizures   HPI  Rhonda Espinoza is a 63 y.o. female brought to the ED via EMS from home with a chief complaint of seizure.  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  Episode lasted 2 to 3 minutes.  States patient was extremely scared afterwards.  Patient did not bite her tongue or suffer urinary incontinence.  Complains of headache and nausea currently.  Endorses several drinks of liquor daily.  Last drink approximately 24 hours ago.  States has been vomiting all day.  Denies fever/chills, chest pain, shortness of breath, diarrhea.     Past Medical History   Past Medical History:  Diagnosis Date  . ADD (attention deficit disorder)   . Alcohol withdrawal seizure (HCC)   . Alcohol-induced pancreatitis   . Anxiety   . Arthritis   . Asthma    allergy induced per patient  . Disc disorder   . Hyperlipidemia   . Hypertension   . Pneumonia      Active Problem List   Patient Active Problem List   Diagnosis Date Noted  . Depression with anxiety 11/05/2023  . Elevated troponin 11/05/2023  . Truncal obesity 11/05/2023  . Diarrhea 11/05/2023  . Near syncope 11/05/2023  . Fatty liver, alcoholic 08/07/2022  . Acute pancreatitis 08/06/2022  . Acute alcoholic pancreatitis 05/29/2021  . Seizures (HCC) 05/18/2021  . Hypokalemia 05/18/2021  . Acute encephalopathy 05/18/2021  . Alcohol withdrawal seizure (HCC) 05/18/2021  . ETOH abuse 05/18/2021  . Surgery, elective 02/05/2021  . History of T12  kyphoplasty 01/24/2019  . Hip pain, acute, right 01/16/2019  . Acute leg pain, right 01/16/2019  . Sciatica of right side without back pain 01/16/2019  . Latex precautions, history of latex allergy 11/02/2018  . Lesion of right native kidney 08/30/2018  . Abnormal MRI, lumbar spine (08/14/2018) 08/23/2018  . Lumbar lateral recess stenosis (Left: L2-3) (Bilateral: L3-4) (Right: L4-5) 08/23/2018  . Lumbar Grade 1 Retrolisthesis of L2/L3, L3/L4, & L4/L5 08/23/2018  . Grade 1 Lumbar Anterolisthesis of L5/S1 08/23/2018  . Lumbar facet hypertrophy (Multilevel) (Bilateral) 08/19/2018  . Lumbar foraminal stenosis (Left: L2-3, L3-4) (Bilateral: L4-5, L5-S1) 08/19/2018  . Lumbar central spinal stenosis 08/19/2018  . DDD (degenerative disc disease), lumbar 08/05/2018  . Traumatic compression fracture of T12 (<15%) thoracic vertebra, sequela 08/05/2018  . Abnormal x-ray of lumbar spine (08/02/2018) 08/05/2018  . Disorder of skeletal system 08/05/2018  . Pharmacologic therapy 08/05/2018  . Problems influencing health status 08/05/2018  . Chronic pain syndrome 08/05/2018  . Class 1 obesity with body mass index (BMI) of 33.0 to 33.9 in adult 05/24/2018  . Seasonal allergic rhinitis due to pollen 02/03/2017  . Chronic low back pain (Bilateral) (R>L) 08/04/2016  . Long term current use of opiate analgesic 08/04/2016  . Long term prescription opiate use 08/04/2016  . Opiate use 08/04/2016  . Spondylosis without myelopathy or radiculopathy, lumbar region 08/04/2016  . ADD (attention deficit disorder) 05/22/2016  . GAD (generalized anxiety disorder) 05/22/2016  .  Hypertension, essential 05/22/2016  . Panic attacks 05/22/2016  . Essential hypertension 08/30/2015  . Back muscle spasm 01/31/2014  . Abnormal LFTs 01/31/2014  . Blood glucose elevated 01/31/2014  . Insomnia 01/31/2014  . Lumbar facet syndrome (Bilateral) (R>L) 01/31/2014  . Elevated LFTs 01/31/2014  . Avitaminosis D 07/03/2011  . Vitamin  D deficiency 07/03/2011  . HLD (hyperlipidemia) 06/23/2011  . Hypertriglyceridemia 06/23/2011     Past Surgical History   Past Surgical History:  Procedure Laterality Date  . CESAREAN SECTION    . CHOLECYSTECTOMY  2022  . FOOT SURGERY Left   . FRACTURE SURGERY Right 07/2020   R wrist   . HERNIA REPAIR    . KNEE ARTHROSCOPY    . KYPHOPLASTY N/A 10/13/2018   Procedure: THORACIC 12 KYPHOPLASTY;  Surgeon: Estill Bamberg, MD;  Location: [facility] OR;  Service: Orthopedics;  Laterality: N/A;  . KYPHOPLASTY N/A 02/05/2021   Procedure: LUMBAR ONE  AND LUMBAR FOUR KYPHOPLASTY;  Surgeon: Estill Bamberg, MD;  Location: [facility] OR;  Service: Orthopedics;  Laterality: N/A;     Home Medications   Prior to Admission medications   Medication Sig Start Date End Date Taking? Authorizing Provider  acetaminophen (TYLENOL) 500 MG tablet Take 1,000 mg by mouth as needed for mild pain or headache.    [provider]  ALPRAZolam Prudy Feeler) 1 MG tablet Take 1 mg by mouth 3 (three) times daily as needed for sleep or anxiety. 05/23/21   [provider]  amLODipine (NORVASC) 10 MG tablet Take 1 tablet by mouth daily. 05/06/23   [provider]  aspirin EC 81 MG tablet Take 81 mg by mouth in the morning. Swallow whole.    [provider]  atorvastatin (LIPITOR) 40 MG tablet Take 40 mg by mouth daily.  06/12/16   [provider]  Cholecalciferol 1.25 MG (50000 UT) capsule Take 50,000 Units by mouth once a week. 07/28/22   [provider]  desvenlafaxine (PRISTIQ) 100 MG 24 hr tablet Take 100 mg by mouth at bedtime.    [provider]  dicyclomine (BENTYL) 20 MG tablet Take 1 tablet (20 mg total) by mouth 2 (two) times daily. 11/11/23   Sloan Leiter, DO  DULoxetine (CYMBALTA) 60 MG capsule Take 60 mg by mouth daily. 08/31/23   [provider]  fenofibrate 160 MG tablet Take 160 mg by mouth daily.    [provider]  fexofenadine (ALLEGRA) 180 MG  tablet Take 180 mg by mouth daily as needed for allergies or rhinitis.    [provider]  folic acid (FOLVITE) 1 MG tablet Take 1 tablet (1 mg total) by mouth daily. Patient not taking: Reported on 08/07/2022 05/20/21   [name], MD  hyoscyamine (LEVBID) 0.375 MG 12 hr tablet Take 0.375 mg by mouth every 12 (twelve) hours as needed. 11/04/23   [provider]  meloxicam (MOBIC) 7.5 MG tablet Take 7.5 mg by mouth daily. 08/19/23   [provider]  Multiple Vitamins-Minerals (MULTIVITAMIN WITH MINERALS) tablet Take 1 tablet by mouth daily.    [provider]  ondansetron (ZOFRAN) 4 MG tablet Take 4 mg by mouth as needed for nausea or vomiting. 11/24/22   [provider]  ondansetron (ZOFRAN-ODT) 8 MG disintegrating tablet Take 8 mg by mouth every 8 (eight) hours as needed for nausea. 11/04/23   [provider]  oxyCODONE (ROXICODONE) 5 MG immediate release tablet Take 1 tablet (5 mg total) by mouth every 6 (six) hours as  needed for severe pain (pain score 7-10). 11/11/23   Tanda Rockers A, DO  pantoprazole (PROTONIX) 40 MG tablet Take 40 mg by mouth 2 (two) times daily.    [provider]  promethazine (PHENERGAN) 25 MG tablet Take 1 tablet (25 mg total) by mouth every 6 (six) hours as needed for nausea or vomiting. 11/11/23   Tanda Rockers A, DO  QUEtiapine (SEROQUEL) 100 MG tablet Take 100 mg by mouth at bedtime. Patient not taking: Reported on 04/29/2023 01/06/23   [provider]  sucralfate (CARAFATE) 1 g tablet Take 1 tablet (1 g total) by mouth with breakfast, with lunch, and with evening meal for 7 days. 11/11/23 11/18/23  Sloan Leiter, DO  Teriparatide 620 MCG/2.48ML SOPN INJECT 20 MCG UNDER THE SKIN 1 TIME A DAY. DISCARD PEN 28 DAYS AFTER INITIAL USE 03/12/23   [provider]  traMADol (ULTRAM) 50 MG tablet Take 50 mg by mouth every 12 (twelve) hours as needed. Patient not taking: Reported on 11/07/2023    [provider]  traZODone (DESYREL) 100 MG tablet Take 1 tablet by mouth at bedtime. Patient not taking: Reported on 11/07/2023    [provider]  triamcinolone cream (KENALOG) 0.1 % Apply 1 Application topically 2 (two) times daily. 10/02/23 10/01/24  [provider]  zolpidem (AMBIEN CR) 12.5 MG CR tablet Take 12.5 mg by mouth at bedtime. 07/16/16   [provider]     Allergies  Latex, Wound dressings, and Valsartan-hydrochlorothiazide   Family History   Family History  Problem Relation Age of Onset  . Pancreatitis Brother   . Seizures Neg Hx      Physical Exam  Triage Vital Signs: ED Triage Vitals  Encounter Vitals Group     BP 12/20/23 2245 (!) 200/91     Systolic BP Percentile --      Diastolic BP Percentile --      Pulse Rate 12/20/23 2245 98     Resp 12/20/23 2251 10     Temp 12/20/23 2245 98.2 F (36.8 C)     Temp Source 12/20/23 2245 Oral     SpO2 12/20/23 2251 99 %     Weight --      Height --      Head Circumference --      Peak Flow --      Pain Score 12/20/23 2244 10     Pain Loc --      Pain Education --      Exclude from Growth Chart --     Updated Vital Signs: BP (!) 176/107   Pulse (!) 102   Temp 98.2 F (36.8 C) (Oral)   Resp 10   LMP 06/30/2011   SpO2 99%    General: Awake, mild distress.  Mildly dry mucous membranes. CV:  RRR.  Good peripheral perfusion.  Resp:  Normal effort.  CTAB. Abd:  Nontender.  No distention.  Other:  PERRL.  EOMI. Alert and oriented to person and place.  CN II-XII grossly intact.  5/5 motor strength and sensation all extremities.  M AE x 4.   ED Results / Procedures / Treatments  Labs (all labs ordered are listed, but only abnormal results are displayed) Labs Reviewed  CBC WITH DIFFERENTIAL/PLATELET - Abnormal; Notable for the following components:      Result Value   Hemoglobin 11.7 (*)    HCT 34.6 (*)    RDW 16.6 (*)    Platelets 410 (*)  All other components within normal  limits  COMPREHENSIVE METABOLIC PANEL - Abnormal; Notable for the following components:   Sodium 134 (*)    Potassium 3.2 (*)    CO2 19 (*)    Glucose, Bld 155 (*)    All other components within normal limits  MAGNESIUM  URINALYSIS, W/ REFLEX TO CULTURE (INFECTION SUSPECTED)  URINE DRUG SCREEN, QUALITATIVE ([facility] ONLY)  ETHANOL  TROPONIN I (HIGH SENSITIVITY)     EKG  ED ECG REPORT I, Juana Haralson J, the attending physician, personally viewed and interpreted this ECG.   Date: 12/20/2023  EKG Time: 2246  Rate: 99  Rhythm: normal sinus rhythm  Axis: Normal  Intervals:none  ST&T Change: Nonspecific    RADIOLOGY I have independently visualized and interpreted patient's imaging studies as well as noted the radiology interpretation:  CT head: No ICH  Chest x-ray: No acute cardiopulmonary process  Official radiology report(s): DG Chest Port 1 View Result Date: 12/20/2023 CLINICAL DATA:  Seizure EXAM: PORTABLE CHEST 1 VIEW COMPARISON:  11/05/2023 FINDINGS: Low lung volumes. Mild cardiomegaly. No acute airspace disease, pleural effusion or pneumothorax IMPRESSION: No active disease. Low lung volumes. Mild cardiomegaly. Electronically Signed   By: Jasmine Pang M.D.   On: 12/20/2023 23:31   CT Head Wo Contrast Result Date: 12/20/2023 CLINICAL DATA:  New onset seizure activity EXAM: CT HEAD WITHOUT CONTRAST TECHNIQUE: Contiguous axial images were obtained from the base of the skull through the vertex without intravenous contrast. RADIATION DOSE REDUCTION: This exam was performed according to the departmental dose-optimization program which includes automated exposure control, adjustment of the mA and/or kV according to patient size and/or use of iterative reconstruction technique. COMPARISON:  11/02/2023 FINDINGS: Brain: No evidence of acute infarction, hemorrhage, hydrocephalus, extra-axial collection or mass lesion/mass effect. Vascular: No hyperdense vessel or unexpected calcification.  Skull: Normal. Negative for fracture or focal lesion. Sinuses/Orbits: No acute finding. Other: None. IMPRESSION: No acute intracranial abnormality noted. Electronically Signed   By: Alcide Clever M.D.   On: 12/20/2023 23:29     PROCEDURES:  Critical Care performed: Yes, see critical care procedure note(s)  CRITICAL CARE Performed by: Irean Hong   Total critical care time: *** minutes  Critical care time was exclusive of separately billable procedures and treating other patients.  Critical care was necessary to treat or prevent imminent or life-threatening deterioration.  Critical care was time spent personally by me on the following activities: development of treatment plan with patient and/or surrogate as well as nursing, discussions with consultants, evaluation of patient's response to treatment, examination of patient, obtaining history from patient or surrogate, ordering and performing treatments and interventions, ordering and review of laboratory studies, ordering and review of radiographic studies, pulse oximetry and re-evaluation of patient's condition.   Marland Kitchen1-3 Lead EKG Interpretation  Performed by: Irean Hong, MD Authorized by: Irean Hong, MD     Interpretation: normal     ECG rate:  98   ECG rate assessment: normal     Rhythm: sinus rhythm     Ectopy: none     Conduction: normal   Comments:     Patient placed on cardiac monitor to evaluate for arrhythmias    MEDICATIONS ORDERED IN ED: Medications  thiamine (VITAMIN B1) tablet 100 mg (has no administration in time range)  LORazepam (ATIVAN) injection 0-4 mg (1 mg Intravenous Given 12/20/23 2340)  famotidine (PEPCID) IVPB 20 mg premix (20 mg Intravenous New Bag/Given 12/20/23 2350)  folic acid (FOLVITE) tablet 1 mg (  has no administration in time range)  multivitamin with minerals tablet 1 tablet (has no administration in time range)  LORazepam (ATIVAN) injection 1 mg (has no administration in time range)   PHENobarbital (LUMINAL) tablet 97.2 mg (has no administration in time range)    Followed by  PHENobarbital (LUMINAL) tablet 64.8 mg (has no administration in time range)    Followed by  PHENobarbital (LUMINAL) tablet 32.4 mg (has no administration in time range)  potassium chloride 10 mEq in 100 mL IVPB (has no administration in time range)  ondansetron (ZOFRAN) injection 4 mg (4 mg Intravenous Given 12/20/23 2344)  sodium chloride 0.9 % bolus 1,000 mL (1,000 mLs Intravenous New Bag/Given 12/20/23 2343)     IMPRESSION / MDM / ASSESSMENT AND PLAN / ED COURSE  I reviewed the triage vital signs and the nursing notes.                             63 year old female presenting with seizure.  Differential diagnosis includes but is not limited to CVA, ICH, brain mass, metabolic, infectious, alcohol withdrawal etiologies, etc.  I personally reviewed patient's records and note her hospitalization from 05/18/2021 for seizure; she was seen by neurology and felt to have alcohol withdrawal seizures and therefore not started on antiepileptic.  Patient's presentation is most consistent with acute presentation with potential threat to life or bodily function.  The patient is on the cardiac monitor to evaluate for evidence of arrhythmia and/or significant heart rate changes.  Will obtain lab work, CT head, place on CIWA scale.  Initiate IV fluids, Zofran for nausea, Pepcid for gastritis.  Will reassess.      FINAL CLINICAL IMPRESSION(S) / ED DIAGNOSES   Final diagnoses:  Seizure (HCC)  Alcohol withdrawal syndrome without complication (HCC)  Alcohol withdrawal seizure without complication (HCC)  Hypokalemia     Rx / DC Orders   ED Discharge Orders     None        Note:  This document was prepared using Dragon voice recognition software and may include unintentional dictation errors.

## 2023-12-20 NOTE — ED Triage Notes (Signed)
 Pt arrives via EMS from home for a seizure that was witnessed by the husband. Pt has a brain tumor that was seen in 2022 and has since grown in size. Pt had a seizure in 2022 and has not had one since. She does not take any meds for seizure. Pt is prescribed meds for HTN but does not take them and does not know the last time she took them. Pt complaining of a 10/10 headache on arrival. Pt has had 4 falls in the last two months but did not have one today.   EMS vitals: 105 HR 116 CBG 99% Spo2 on RA

## 2023-12-21 DIAGNOSIS — Z79899 Other long term (current) drug therapy: Secondary | ICD-10-CM | POA: Diagnosis not present

## 2023-12-21 DIAGNOSIS — I10 Essential (primary) hypertension: Secondary | ICD-10-CM | POA: Diagnosis present

## 2023-12-21 DIAGNOSIS — E785 Hyperlipidemia, unspecified: Secondary | ICD-10-CM

## 2023-12-21 DIAGNOSIS — G4089 Other seizures: Secondary | ICD-10-CM | POA: Diagnosis present

## 2023-12-21 DIAGNOSIS — Z888 Allergy status to other drugs, medicaments and biological substances status: Secondary | ICD-10-CM | POA: Diagnosis not present

## 2023-12-21 DIAGNOSIS — F10939 Alcohol use, unspecified with withdrawal, unspecified: Secondary | ICD-10-CM

## 2023-12-21 DIAGNOSIS — Z91040 Latex allergy status: Secondary | ICD-10-CM | POA: Diagnosis not present

## 2023-12-21 DIAGNOSIS — R569 Unspecified convulsions: Secondary | ICD-10-CM | POA: Diagnosis present

## 2023-12-21 DIAGNOSIS — J45909 Unspecified asthma, uncomplicated: Secondary | ICD-10-CM | POA: Diagnosis present

## 2023-12-21 DIAGNOSIS — Z791 Long term (current) use of non-steroidal anti-inflammatories (NSAID): Secondary | ICD-10-CM | POA: Diagnosis not present

## 2023-12-21 DIAGNOSIS — R109 Unspecified abdominal pain: Secondary | ICD-10-CM | POA: Diagnosis present

## 2023-12-21 DIAGNOSIS — E876 Hypokalemia: Secondary | ICD-10-CM

## 2023-12-21 DIAGNOSIS — F10239 Alcohol dependence with withdrawal, unspecified: Secondary | ICD-10-CM | POA: Diagnosis present

## 2023-12-21 DIAGNOSIS — R112 Nausea with vomiting, unspecified: Secondary | ICD-10-CM | POA: Diagnosis present

## 2023-12-21 DIAGNOSIS — F419 Anxiety disorder, unspecified: Secondary | ICD-10-CM | POA: Diagnosis present

## 2023-12-21 DIAGNOSIS — R1084 Generalized abdominal pain: Secondary | ICD-10-CM

## 2023-12-21 DIAGNOSIS — Z9049 Acquired absence of other specified parts of digestive tract: Secondary | ICD-10-CM | POA: Diagnosis not present

## 2023-12-21 DIAGNOSIS — R519 Headache, unspecified: Secondary | ICD-10-CM | POA: Diagnosis present

## 2023-12-21 LAB — URINALYSIS, W/ REFLEX TO CULTURE (INFECTION SUSPECTED)
Bilirubin Urine: NEGATIVE
Glucose, UA: NEGATIVE mg/dL
Hgb urine dipstick: NEGATIVE
Ketones, ur: 5 mg/dL — AB
Leukocytes,Ua: NEGATIVE
Nitrite: NEGATIVE
Protein, ur: 100 mg/dL — AB
Specific Gravity, Urine: 1.014 (ref 1.005–1.030)
pH: 6 (ref 5.0–8.0)

## 2023-12-21 LAB — CBC
HCT: 31.1 % — ABNORMAL LOW (ref 36.0–46.0)
Hemoglobin: 10.5 g/dL — ABNORMAL LOW (ref 12.0–15.0)
MCH: 30 pg (ref 26.0–34.0)
MCHC: 33.8 g/dL (ref 30.0–36.0)
MCV: 88.9 fL (ref 80.0–100.0)
Platelets: 404 10*3/uL — ABNORMAL HIGH (ref 150–400)
RBC: 3.5 MIL/uL — ABNORMAL LOW (ref 3.87–5.11)
RDW: 16.4 % — ABNORMAL HIGH (ref 11.5–15.5)
WBC: 8.6 10*3/uL (ref 4.0–10.5)
nRBC: 0 % (ref 0.0–0.2)

## 2023-12-21 LAB — BASIC METABOLIC PANEL
Anion gap: 12 (ref 5–15)
BUN: 9 mg/dL (ref 8–23)
CO2: 21 mmol/L — ABNORMAL LOW (ref 22–32)
Calcium: 8.7 mg/dL — ABNORMAL LOW (ref 8.9–10.3)
Chloride: 102 mmol/L (ref 98–111)
Creatinine, Ser: 0.66 mg/dL (ref 0.44–1.00)
GFR, Estimated: >60 mL/min (ref >60)
Glucose, Bld: 113 mg/dL — ABNORMAL HIGH (ref 70–99)
Potassium: 3.7 mmol/L (ref 3.5–5.1)
Sodium: 135 mmol/L (ref 135–145)

## 2023-12-21 LAB — URINE DRUG SCREEN, QUALITATIVE (ARMC ONLY)
Amphetamines, Ur Screen: NOT DETECTED
Barbiturates, Ur Screen: NOT DETECTED
Benzodiazepine, Ur Scrn: POSITIVE — AB
Cannabinoid 50 Ng, Ur ~~LOC~~: POSITIVE — AB
Cocaine Metabolite,Ur ~~LOC~~: NOT DETECTED
MDMA (Ecstasy)Ur Screen: NOT DETECTED
Methadone Scn, Ur: NOT DETECTED
Opiate, Ur Screen: NOT DETECTED
Phencyclidine (PCP) Ur S: NOT DETECTED
Tricyclic, Ur Screen: NOT DETECTED

## 2023-12-21 LAB — LIPASE, BLOOD: Lipase: 33 U/L (ref 11–51)

## 2023-12-21 LAB — ETHANOL: Alcohol, Ethyl (B): <10 mg/dL (ref <10)

## 2023-12-21 LAB — PHOSPHORUS: Phosphorus: 2.6 mg/dL (ref 2.5–4.6)

## 2023-12-21 MED ORDER — MORPHINE SULFATE (PF) 2 MG/ML IV SOLN
2.0000 mg | INTRAVENOUS | Status: DC | PRN
  Administered 2023-12-21: 2 mg via INTRAVENOUS
  Filled 2023-12-21: qty 1

## 2023-12-21 MED ORDER — PANTOPRAZOLE SODIUM 40 MG PO TBEC
40.0000 mg | DELAYED_RELEASE_TABLET | Freq: Two times a day (BID) | ORAL | Status: DC
  Administered 2023-12-21 – 2023-12-22 (×4): 40 mg via ORAL
  Filled 2023-12-21 (×4): qty 1

## 2023-12-21 MED ORDER — SODIUM CHLORIDE 0.9 % IV SOLN: INTRAVENOUS | Status: DC

## 2023-12-21 MED ORDER — ONDANSETRON HCL 4 MG/2ML IJ SOLN
4.0000 mg | Freq: Three times a day (TID) | INTRAMUSCULAR | Status: DC | PRN
Start: 2023-12-21 — End: 2023-12-22

## 2023-12-21 MED ORDER — OXYCODONE HCL 5 MG PO TABS
5.0000 mg | ORAL_TABLET | Freq: Four times a day (QID) | ORAL | Status: DC | PRN
  Administered 2023-12-21 (×2): 5 mg via ORAL
  Filled 2023-12-21 (×2): qty 1

## 2023-12-21 MED ORDER — HYDRALAZINE HCL 20 MG/ML IJ SOLN
10.0000 mg | INTRAMUSCULAR | Status: DC | PRN
  Administered 2023-12-21: 10 mg via INTRAVENOUS
  Filled 2023-12-21: qty 1

## 2023-12-21 MED ORDER — ALPRAZOLAM 0.5 MG PO TABS: 1.0000 mg | ORAL_TABLET | Freq: Three times a day (TID) | ORAL | Status: DC | PRN

## 2023-12-21 MED ORDER — ENOXAPARIN SODIUM 60 MG/0.6ML IJ SOSY
50.0000 mg | PREFILLED_SYRINGE | INTRAMUSCULAR | Status: DC
  Administered 2023-12-21 – 2023-12-22 (×2): 50 mg via SUBCUTANEOUS
  Filled 2023-12-21 (×2): qty 0.6

## 2023-12-21 MED ORDER — ZOLPIDEM TARTRATE 5 MG PO TABS
5.0000 mg | ORAL_TABLET | Freq: Every evening | ORAL | Status: DC | PRN
  Administered 2023-12-21: 5 mg via ORAL
  Filled 2023-12-21: qty 1

## 2023-12-21 MED ORDER — ACETAMINOPHEN 325 MG PO TABS
650.0000 mg | ORAL_TABLET | Freq: Four times a day (QID) | ORAL | Status: DC | PRN
  Administered 2023-12-21: 650 mg via ORAL
  Filled 2023-12-21: qty 2

## 2023-12-21 MED ORDER — ALBUTEROL SULFATE (2.5 MG/3ML) 0.083% IN NEBU: 2.5000 mg | INHALATION_SOLUTION | RESPIRATORY_TRACT | Status: DC | PRN

## 2023-12-21 MED ORDER — SUCRALFATE 1 G PO TABS: 1.0000 g | ORAL_TABLET | Freq: Three times a day (TID) | ORAL | Status: DC

## 2023-12-21 MED ORDER — POTASSIUM CHLORIDE CRYS ER 20 MEQ PO TBCR
40.0000 meq | EXTENDED_RELEASE_TABLET | Freq: Once | ORAL | Status: AC
  Administered 2023-12-21: 40 meq via ORAL
  Filled 2023-12-21: qty 2

## 2023-12-21 MED ORDER — AMLODIPINE BESYLATE 5 MG PO TABS
10.0000 mg | ORAL_TABLET | Freq: Every day | ORAL | Status: DC
  Administered 2023-12-21 – 2023-12-22 (×2): 10 mg via ORAL
  Filled 2023-12-21 (×3): qty 2

## 2023-12-21 MED ORDER — DM-GUAIFENESIN ER 30-600 MG PO TB12: 1.0000 | ORAL_TABLET | Freq: Two times a day (BID) | ORAL | Status: DC | PRN

## 2023-12-21 MED ORDER — VENLAFAXINE HCL ER 150 MG PO CP24
150.0000 mg | ORAL_CAPSULE | Freq: Every day | ORAL | Status: DC
  Administered 2023-12-21: 150 mg via ORAL
  Filled 2023-12-21: qty 1

## 2023-12-21 MED ORDER — FENOFIBRATE 160 MG PO TABS
160.0000 mg | ORAL_TABLET | Freq: Every day | ORAL | Status: DC
  Administered 2023-12-21 – 2023-12-22 (×2): 160 mg via ORAL
  Filled 2023-12-21 (×2): qty 1

## 2023-12-21 MED ORDER — ATORVASTATIN CALCIUM 20 MG PO TABS
40.0000 mg | ORAL_TABLET | Freq: Every day | ORAL | Status: DC
  Administered 2023-12-21 – 2023-12-22 (×2): 40 mg via ORAL
  Filled 2023-12-21 (×2): qty 2

## 2023-12-21 MED ORDER — HYDRALAZINE HCL 20 MG/ML IJ SOLN: 5.0000 mg | INTRAMUSCULAR | Status: DC | PRN

## 2023-12-21 MED ORDER — LORAZEPAM 2 MG/ML IJ SOLN: 2.0000 mg | INTRAMUSCULAR | Status: DC | PRN

## 2023-12-21 NOTE — Progress Notes (Signed)
 PHARMACIST - PHYSICIAN COMMUNICATION  CONCERNING:  Enoxaparin (Lovenox) for DVT Prophylaxis    RECOMMENDATION: Patient was prescribed enoxaprin 40mg  q24 hours for VTE prophylaxis.   Filed Weights   12/21/23 0241  Weight: 98 kg (216 lb 0.8 oz)    Body mass index is 33.84 kg/m.  Estimated Creatinine Clearance: 83.5 mL/min (by C-G formula based on SCr of 0.84 mg/dL).   Based on [facility] policy patient is candidate for enoxaparin 0.5mg /kg TBW SQ every 24 hours based on BMI being >30.  DESCRIPTION: Pharmacy has adjusted enoxaparin dose per [facility] policy.  Patient is now receiving enoxaparin 0.5 mg/kg every 24 hours   Otelia Sergeant, PharmD, [facility] 12/21/2023 2:42 AM

## 2023-12-21 NOTE — Discharge Instructions (Signed)

## 2023-12-21 NOTE — ED Notes (Signed)
 This RN tried twice for remaining ethanol lab and was unsuccessful. Lab called to send phlebotomist to draw blood.

## 2023-12-21 NOTE — TOC Initial Note (Signed)
 Transition of Care [facility]) - Initial/Assessment Note    Patient Details  Name: Rhonda Espinoza MRN: [id number] Date of Birth: 1960/12/23  [facility]) CM/SW Contact:    Elberta Fortis, RN Phone Number: 12/21/2023, 11:30 AM  Clinical Narrative:                 Met with patient in the ER room. Substance abuse resources added to AVS with her permission. TOC to continue to follow  Expected Discharge Plan: Home/Self Care Barriers to Discharge: Continued Medical Work up   Patient Goals and CMS Choice            Expected Discharge Plan and Services       Living arrangements for the past 2 months: Single Family Home                                      Prior Living Arrangements/Services Living arrangements for the past 2 months: Single Family Home Lives with:: Spouse Patient language and need for interpreter reviewed:: Yes        Need for Family Participation in Patient Care: Yes (Comment) Care giver support system in place?: Yes (comment)   Criminal Activity/Legal Involvement Pertinent to Current Situation/Hospitalization: No - Comment as needed  Activities of Daily Living   ADL Screening (condition at time of admission) Independently performs ADLs?: Yes (appropriate for developmental age) Is the patient deaf or have difficulty hearing?: No Does the patient have difficulty seeing, even when wearing glasses/contacts?: No Does the patient have difficulty concentrating, remembering, or making decisions?: No  Permission Sought/Granted                  Emotional Assessment Appearance:: Appears stated age Attitude/Demeanor/Rapport: Gracious, Engaged Affect (typically observed): Accepting, Appropriate, Calm, Pleasant Orientation: : Oriented to Self, Oriented to Place, Oriented to  Time, Oriented to Situation Alcohol / Substance Use: Alcohol Use Psych Involvement: No (comment)  Admission diagnosis:  Seizure (HCC) [R56.9] Alcohol withdrawal  seizure (HCC) [F10.939, R56.9] Patient Active Problem List   Diagnosis Date Noted   Seizure (HCC) 12/21/2023   Abdominal pain 12/21/2023   Depression with anxiety 11/05/2023   Elevated troponin 11/05/2023   Truncal obesity 11/05/2023   Diarrhea 11/05/2023   Near syncope 11/05/2023   Fatty liver, alcoholic 08/07/2022   Acute pancreatitis 08/06/2022   Acute alcoholic pancreatitis 05/29/2021   Seizures (HCC) 05/18/2021   Hypokalemia 05/18/2021   Acute encephalopathy 05/18/2021   Alcohol withdrawal seizure (HCC) 05/18/2021   ETOH abuse 05/18/2021   Surgery, elective 02/05/2021   History of T12 kyphoplasty 01/24/2019   Hip pain, acute, right 01/16/2019   Acute leg pain, right 01/16/2019   Sciatica of right side without back pain 01/16/2019   Latex precautions, history of latex allergy 11/02/2018   Lesion of right native kidney 08/30/2018   Abnormal MRI, lumbar spine (08/14/2018) 08/23/2018   Lumbar lateral recess stenosis (Left: L2-3) (Bilateral: L3-4) (Right: L4-5) 08/23/2018   Lumbar Grade 1 Retrolisthesis of L2/L3, L3/L4, & L4/L5 08/23/2018   Grade 1 Lumbar Anterolisthesis of L5/S1 08/23/2018   Lumbar facet hypertrophy (Multilevel) (Bilateral) 08/19/2018   Lumbar foraminal stenosis (Left: L2-3, L3-4) (Bilateral: L4-5, L5-S1) 08/19/2018   Lumbar central spinal stenosis 08/19/2018   DDD (degenerative disc disease), lumbar 08/05/2018   Traumatic compression fracture of T12 (<15%) thoracic vertebra, sequela 08/05/2018   Abnormal x-ray of lumbar spine (08/02/2018) 08/05/2018  Disorder of skeletal system 08/05/2018   Pharmacologic therapy 08/05/2018   Problems influencing health status 08/05/2018   Chronic pain syndrome 08/05/2018   Class 1 obesity with body mass index (BMI) of 33.0 to 33.9 in adult 05/24/2018   Seasonal allergic rhinitis due to pollen 02/03/2017   Chronic low back pain (Bilateral) (R>L) 08/04/2016   Long term current use of opiate analgesic 08/04/2016   Long term  prescription opiate use 08/04/2016   Opiate use 08/04/2016   Spondylosis without myelopathy or radiculopathy, lumbar region 08/04/2016   ADD (attention deficit disorder) 05/22/2016   GAD (generalized anxiety disorder) 05/22/2016   Hypertension, essential 05/22/2016   Panic attacks 05/22/2016   Essential hypertension 08/30/2015   Back muscle spasm 01/31/2014   Abnormal LFTs 01/31/2014   Blood glucose elevated 01/31/2014   Insomnia 01/31/2014   Lumbar facet syndrome (Bilateral) (R>L) 01/31/2014   Elevated LFTs 01/31/2014   Avitaminosis D 07/03/2011   Vitamin D deficiency 07/03/2011   HLD (hyperlipidemia) 06/23/2011   Hypertriglyceridemia 06/23/2011   PCP:  Ladora Daniel, PA-C Pharmacy:   [facility] - [place], [place] - [address] [place] [address] Phone: [phone] Fax: [phone]     Social Drivers of Health (SDOH) Social History: SDOH Screenings   Food Insecurity: No Food Insecurity (12/21/2023)  Housing: High Risk (12/21/2023)  Transportation Needs: No Transportation Needs (12/21/2023)  Utilities: Not At Risk (12/21/2023)  Depression (PHQ2-9): Low Risk  (07/23/2019)  Financial Resource Strain: Low Risk  (10/02/2023)   Received from [facility]  Physical Activity: Insufficiently Active (08/21/2023)   Received from [facility]  Social Connections: Moderately Isolated (12/21/2023)  Stress: No Stress Concern Present (08/21/2023)   Received from [facility]  Tobacco Use: Low Risk  (12/20/2023)   SDOH Interventions:     Readmission Risk Interventions    11/05/2023    3:26 PM  Readmission Risk Prevention Plan  Transportation Screening Complete  PCP or Specialist Appt within 3-5 Days Complete  Social Work Consult for Recovery Care Planning/Counseling Complete  Palliative Care Screening Not Applicable  Medication Review Oceanographer) Complete

## 2023-12-21 NOTE — Progress Notes (Signed)
 Brief rounding note, same day as admission  HPI: Pt admitted after midnight following an alcohol withdrawal seizure. See H&P for full HPI on admission & ED course.   Interval history: Pt seen in the ED holding for a bed this AM.  Sleeping, woke easily to voice. Reports feeling well, no acute complaints  No tremors. Some mild abdominal pain not nausea/vomiting. Agreeable to trying soft diet.   Exam: General exam: awake, alert, no acute distress Respiratory system: CTAB, no wheezes, rales or rhonchi, normal respiratory effort. Cardiovascular system: normal S1/S2, RRR, no pedal edema.   Gastrointestinal system: soft, NT, ND, no HSM felt, +bowel sounds. Central nervous system: A&O x 3. no gross focal neurologic deficits, normal speech Extremities: moves all, no edema, normal tone Skin: dry, intact, normal temperature Psychiatry: normal mood, congruent affect, judgement and insight appear normal   A&P: as per H&P by Dr. Clyde Lundborg, with any changes or additions as below:  Lipase normalized & abdominal pain improved.  --advance diet to soft --monitor for abdominal pain, N/V    No charge

## 2023-12-21 NOTE — H&P (Signed)
 History and Physical    Rhonda Espinoza ZOX:[id number] DOB: Aug 08, 1961 DOA: 12/20/2023  Referring MD/NP/PA:   PCP: Ladora Daniel, PA-C   Patient coming from:  The patient is coming from home.     Chief Complaint: seizure, abdominal pain   HPI: Rhonda Espinoza is a 63 y.o. female with medical history significant of alcohol abuse, alcoholic pancreatitis, alcohol withdrawal seizure, hypertension, hyperlipidemia, depression with anxiety, ADD, chronic pain, who presents with seizure, abdominal pain.  Her husband reported to ED physician that pt had seizure, described as stiffened in the bed with both legs straight out and her right arm above her head, eyes rolled in the back of her head, making guttural noises and convulsing.  Episode lasted 2 to 3 minutes.  Patient did not bite her tongue, no urinary incontinence. She complains of headache. When I saw patient in ED, she is alert oriented x 3.  Patient is tremulous.  No unilateral numbness or tingling in extremities.  No facial droop or slurred speech.  Patient states that she did not have chest pain initially, but after having many episodes of nausea and vomiting, she started feeling pain in her front chest. She pointed to the pathway along her esophagus.  No hematemesis.  She has abdominal pain, which is located in the middle abdomen, constant, aching, nonradiating, not aggravated or alleviated by any known factors.  No fever or chills.  No symptoms of UTI.  She states that she had diarrhea recently, but not today.  No hallucination.  Her last drink of alcohol was 3 days ago per patient  Data reviewed independently and ED Course: pt was found to have troponin 6, WBC 8.2, GFR > 60, potassium 3.2, magnesium 1.9, pending alcohol level, temperature normal, blood pressure 181/103, heart rate of 102, RR 10, oxygen saturation 100% on room air.  Chest x-ray showed mild cardiomegaly without infiltration.  CT of head negative.  Patient is placed in PCU for  observation.  EKG: I have personally reviewed.  Sinus rhythm, QTc 475, borderline LAD, poor R wave progression.   Review of Systems:   General: no fevers, chills, no body weight gain, has poor appetite, has fatigue HEENT: no blurry vision, hearing changes or sore throat Respiratory: no dyspnea, coughing, wheezing CV: has chest pain, no palpitations GI: has nausea, vomiting, abdominal pain, no diarrhea, constipation GU: no dysuria, burning on urination, increased urinary frequency, hematuria  Ext: no leg edema Neuro: no unilateral weakness, numbness, or tingling, no vision change or hearing loss.  Has seizure Skin: no rash, no skin tear. MSK: No muscle spasm, no deformity, no limitation of range of movement in spin Heme: No easy bruising.  Travel history: No recent long distant travel.   Allergy:  Allergies  Allergen Reactions   Latex Other (See Comments)    "moreso like bandages"- these cause blisters   Wound Dressings Other (See Comments)    "moreso like bandages"- these cause blisters   Valsartan-Hydrochlorothiazide Other (See Comments)    Caused pancreatitis     Past Medical History:  Diagnosis Date   ADD (attention deficit disorder)    Alcohol withdrawal seizure (HCC)    Alcohol-induced pancreatitis    Anxiety    Arthritis    Asthma    allergy induced per patient   Disc disorder    Hyperlipidemia    Hypertension    Pneumonia     Past Surgical History:  Procedure Laterality Date   CESAREAN SECTION     CHOLECYSTECTOMY  2022   FOOT SURGERY Left    FRACTURE SURGERY Right 07/2020   R wrist    HERNIA REPAIR     KNEE ARTHROSCOPY     KYPHOPLASTY N/A 10/13/2018   Procedure: THORACIC 12 KYPHOPLASTY;  Surgeon: Estill Bamberg, MD;  Location: [facility] OR;  Service: Orthopedics;  Laterality: N/A;   KYPHOPLASTY N/A 02/05/2021   Procedure: LUMBAR ONE  AND LUMBAR FOUR KYPHOPLASTY;  Surgeon: Estill Bamberg, MD;  Location: [facility] OR;  Service: Orthopedics;  Laterality: N/A;     Social History:  reports that she has never smoked. She has never used smokeless tobacco. She reports current alcohol use of about 4.0 standard drinks of alcohol per week. She reports that she does not use drugs.  Family History:  Family History  Problem Relation Age of Onset   Pancreatitis Brother    Seizures Neg Hx      Prior to Admission medications   Medication Sig Start Date End Date Taking? Authorizing Provider  acetaminophen (TYLENOL) 500 MG tablet Take 1,000 mg by mouth as needed for mild pain or headache.    [provider]  ALPRAZolam Prudy Feeler) 1 MG tablet Take 1 mg by mouth 3 (three) times daily as needed for sleep or anxiety. 05/23/21   [provider]  amLODipine (NORVASC) 10 MG tablet Take 1 tablet by mouth daily. 05/06/23   [provider]  aspirin EC 81 MG tablet Take 81 mg by mouth in the morning. Swallow whole.    [provider]  atorvastatin (LIPITOR) 40 MG tablet Take 40 mg by mouth daily.  06/12/16   [provider]  Cholecalciferol 1.25 MG (50000 UT) capsule Take 50,000 Units by mouth once a week. 07/28/22   [provider]  desvenlafaxine (PRISTIQ) 100 MG 24 hr tablet Take 100 mg by mouth at bedtime.    [provider]  dicyclomine (BENTYL) 20 MG tablet Take 1 tablet (20 mg total) by mouth 2 (two) times daily. 11/11/23   Sloan Leiter, DO  DULoxetine (CYMBALTA) 60 MG capsule Take 60 mg by mouth daily. 08/31/23   [provider]  fenofibrate 160 MG tablet Take 160 mg by mouth daily.    [provider]  fexofenadine (ALLEGRA) 180 MG tablet Take 180 mg by mouth daily as needed for allergies or rhinitis.    [provider]  folic acid (FOLVITE) 1 MG tablet Take 1 tablet (1 mg total) by mouth daily. Patient not taking: Reported on 08/07/2022 05/20/21   [name], MD  hyoscyamine (LEVBID) 0.375 MG 12 hr tablet Take 0.375 mg by mouth every 12 (twelve) hours as needed. 11/04/23    [provider]  meloxicam (MOBIC) 7.5 MG tablet Take 7.5 mg by mouth daily. 08/19/23   [provider]  Multiple Vitamins-Minerals (MULTIVITAMIN WITH MINERALS) tablet Take 1 tablet by mouth daily.    [provider]  ondansetron (ZOFRAN) 4 MG tablet Take 4 mg by mouth as needed for nausea or vomiting. 11/24/22   [provider]  ondansetron (ZOFRAN-ODT) 8 MG disintegrating tablet Take 8 mg by mouth every 8 (eight) hours as needed for nausea. 11/04/23   [provider]  oxyCODONE (ROXICODONE) 5 MG immediate release tablet Take 1 tablet (5 mg total) by mouth every 6 (six) hours as needed for severe pain (pain score 7-10). 11/11/23   Tanda Rockers A, DO  pantoprazole (PROTONIX) 40 MG tablet Take 40 mg by mouth 2 (two) times daily.    [provider]  promethazine (PHENERGAN) 25 MG tablet Take 1 tablet (25 mg total) by mouth every 6 (six) hours as needed for nausea or vomiting. 11/11/23   Tanda Rockers A, DO  QUEtiapine (SEROQUEL) 100 MG tablet Take 100 mg by mouth at bedtime. Patient not taking: Reported on 04/29/2023 01/06/23   [provider]  sucralfate (CARAFATE) 1 g tablet Take 1 tablet (1 g total) by mouth with breakfast, with lunch, and with evening meal for 7 days. 11/11/23 11/18/23  Sloan Leiter, DO  Teriparatide 620 MCG/2.48ML SOPN INJECT 20 MCG UNDER THE SKIN 1 TIME A DAY. DISCARD PEN 28 DAYS AFTER INITIAL USE 03/12/23   [provider]  traMADol (ULTRAM) 50 MG tablet Take 50 mg by mouth every 12 (twelve) hours as needed. Patient not taking: Reported on 11/07/2023    [provider]  traZODone (DESYREL) 100 MG tablet Take 1 tablet by mouth at bedtime. Patient not taking: Reported on 11/07/2023    [provider]  triamcinolone cream (KENALOG) 0.1 % Apply 1 Application topically 2 (two) times daily. 10/02/23 10/01/24  [provider]  zolpidem (AMBIEN CR) 12.5 MG CR tablet Take 12.5 mg by mouth at bedtime.  07/16/16   [provider]    Physical Exam: Vitals:   12/20/23 2245 12/20/23 2251 12/20/23 2332 12/21/23 0040  BP: (!) 200/91  (!) 176/107 (!) 191/103  Pulse: 98  (!) 102 93  Resp:  10  10  Temp: 98.2 F (36.8 C)     TempSrc: Oral     SpO2:  99%  100%   General: Not in acute distress.  Patient is tremulous HEENT:       Eyes: PERRL, EOMI, no jaundice       ENT: No discharge from the ears and nose, no pharynx injection, no tonsillar enlargement.        Neck: No JVD, no bruit, no mass felt. Heme: No neck lymph node enlargement. Cardiac: S1/S2, RRR, No murmurs, No gallops or rubs. Respiratory: No rales, wheezing, rhonchi or rubs. GI: Soft, nondistended, has central abdominal tenderness, no rebound pain, no organomegaly, BS present. GU: No hematuria Ext: No pitting leg edema bilaterally. 1+DP/PT pulse bilaterally. Musculoskeletal: No joint deformities, No joint redness or warmth, no limitation of ROM in spin. Skin: No rashes.  Neuro: Alert, oriented X3, cranial nerves II-XII grossly intact, moves all extremities normally Psych: Patient is not psychotic, no suicidal or hemocidal ideation.  Labs on Admission: I have personally reviewed following labs and imaging studies  CBC: Recent Labs  Lab 12/20/23 2302  WBC 8.2  NEUTROABS 6.1  HGB 11.7*  HCT 34.6*  MCV 88.7  PLT 410*   Basic Metabolic Panel: Recent Labs  Lab 12/20/23 2302  NA 134*  K 3.2*  CL 101  CO2 19*  GLUCOSE 155*  BUN 9  CREATININE 0.84  CALCIUM 9.1  MG 1.9   GFR: CrCl cannot be calculated (Unknown ideal weight.). Liver Function Tests: Recent Labs  Lab 12/20/23 2302  AST 37  ALT 22  ALKPHOS 92  BILITOT 1.1  PROT 8.1  ALBUMIN 4.4   Recent Labs  Lab 12/20/23 2302  LIPASE 33   No results for input(s): "AMMONIA" in the last 168 hours. Coagulation Profile: No results for input(s): "INR", "PROTIME" in the last 168 hours. Cardiac Enzymes: No results for input(s): "CKTOTAL", "CKMB",  "CKMBINDEX", "TROPONINI" in the last 168 hours. BNP (last 3 results) No results for input(s): "PROBNP" in the last 8760 hours.  HbA1C: No results for input(s): "HGBA1C" in the last 72 hours. CBG: No results for input(s): "GLUCAP" in the last 168 hours. Lipid Profile: No results for input(s): "CHOL", "HDL", "LDLCALC", "TRIG", "CHOLHDL", "LDLDIRECT" in the last 72 hours. Thyroid Function Tests: No results for input(s): "TSH", "T4TOTAL", "FREET4", "T3FREE", "THYROIDAB" in the last 72 hours. Anemia Panel: No results for input(s): "VITAMINB12", "FOLATE", "FERRITIN", "TIBC", "IRON", "RETICCTPCT" in the last 72 hours. Urine analysis:    Component Value Date/Time   COLORURINE YELLOW (A) 12/20/2023 2346   APPEARANCEUR HAZY (A) 12/20/2023 2346   LABSPEC 1.014 12/20/2023 2346   PHURINE 6.0 12/20/2023 2346   GLUCOSEU NEGATIVE 12/20/2023 2346   HGBUR NEGATIVE 12/20/2023 2346   BILIRUBINUR NEGATIVE 12/20/2023 2346   KETONESUR 5 (A) 12/20/2023 2346   PROTEINUR 100 (A) 12/20/2023 2346   NITRITE NEGATIVE 12/20/2023 2346   LEUKOCYTESUR NEGATIVE 12/20/2023 2346   Sepsis Labs: @LABRCNTIP (procalcitonin:4,lacticidven:4) )No results found for this or any previous visit (from the past 240 hours).   Radiological Exams on Admission:   Assessment/Plan Principal Problem:   Alcohol withdrawal seizure (HCC) Active Problems:   ETOH abuse   HLD (hyperlipidemia)   Essential hypertension   Hypokalemia   Abdominal pain   Depression with anxiety   Assessment and Plan:  Alcohol withdrawal seizure and ETOH abuse: Her seizure is likely due to alcohol withdrawal.  CT head negative.  No focal neurodeficit on physical examination.  -Place in PCU for observation -Seizure precaution -As needed Ativan for seizure -Started phenobarbital tapering per pharm dosing -As needed Ativan -give Vb1 and folic acid -I did counseling about importance of cutting down and then stop alcohol drinking  HLD  (hyperlipidemia) -Fenofibrate  Essential hypertension -IV hydralazine as needed -Amlodipine  Hypokalemia: Potassium 3.2, magnesium 1.9 -Repleted potassium -Check phosphorus level  Abdominal pain: Potential differential diagnosis includes alcoholic pancreatitis and alcoholic gastritis.  No acute abdomen on physical examination. -Patient received 1 dose of Pepcid 20 mg in ED -Continue Protonix and Carafate -Check lipase -As needed morphine and oxycodone for pain -As needed Zofran for nausea -IV fluid: 1 L normal saline, then 75 cc/h -Clear diet for now  Depression with anxiety -Continue home medications          DVT ppx: SQ Lovenox  Code Status: Full code    Family Communication:     not done, no family member is at bed side.       Disposition Plan:  Anticipate discharge back to previous environment  Consults called:  none  Admission status and Level of care: Progressive:    for obs     Dispo: The patient is from: Home              Anticipated d/c is to: Home              Anticipated d/c date is: 1 day              Patient currently is not medically stable to d/c.    Severity of Illness:  The appropriate patient status for this patient is OBSERVATION. Observation status is judged to be reasonable and necessary in order to provide the required intensity of service to ensure the patient's safety. The patient's presenting symptoms, physical exam findings, and initial radiographic and laboratory data in the context of their medical condition is felt to place them at decreased risk for further clinical deterioration. Furthermore, it is anticipated that the patient will be medically stable for discharge from the hospital within  2 midnights of admission.        Date of Service 12/21/2023    Lorretta Harp [facility]   If 7PM-7AM, please contact night-coverage [email or web address] 12/21/2023, 1:40 AM

## 2023-12-22 DIAGNOSIS — F10939 Alcohol use, unspecified with withdrawal, unspecified: Secondary | ICD-10-CM | POA: Diagnosis not present

## 2023-12-22 DIAGNOSIS — R569 Unspecified convulsions: Secondary | ICD-10-CM | POA: Diagnosis not present

## 2023-12-22 LAB — HIV ANTIBODY (ROUTINE TESTING W REFLEX): HIV Screen 4th Generation wRfx: NONREACTIVE

## 2023-12-22 MED ORDER — PHENOBARBITAL 32.4 MG PO TABS
ORAL_TABLET | ORAL | 0 refills | Status: DC
Start: 2023-12-22 — End: 2024-03-30

## 2023-12-22 MED ORDER — OXYCODONE HCL 5 MG PO TABS: 5.0000 mg | ORAL_TABLET | Freq: Four times a day (QID) | ORAL | 0 refills | Status: DC | PRN

## 2023-12-22 MED ORDER — THIAMINE HCL 100 MG PO TABS: 100.0000 mg | ORAL_TABLET | Freq: Every day | ORAL | 2 refills | Status: AC

## 2023-12-22 MED ORDER — METHOCARBAMOL 750 MG PO TABS: 750.0000 mg | ORAL_TABLET | Freq: Four times a day (QID) | ORAL | 0 refills | Status: DC | PRN

## 2023-12-22 MED ORDER — METHOCARBAMOL 500 MG PO TABS
750.0000 mg | ORAL_TABLET | Freq: Once | ORAL | Status: AC
  Administered 2023-12-22: 750 mg via ORAL
  Filled 2023-12-22: qty 2

## 2023-12-22 NOTE — TOC Transition Note (Signed)
 Transition of Care [facility]) - Discharge Note   Patient Details  Name: Rhonda Espinoza MRN: [id number] Date of Birth: [birth year]  Transition of Care [facility]) CM/SW Contact:  Colin Broach, LCSW Phone Number: 12/22/2023, 11:04 AM   Clinical Narrative:    Patient to discharge home with self-care.  No further TOC needs identified.  CSW signing off.    Final next level of care: Home/Self Care Barriers to Discharge: No Barriers Identified   Patient Goals and CMS Choice            Discharge Placement                       Discharge Plan and Services Additional resources added to the After Visit Summary for                                       Social Drivers of Health (SDOH) Interventions SDOH Screenings   Food Insecurity: No Food Insecurity (12/21/2023)  Housing: High Risk (12/21/2023)  Transportation Needs: No Transportation Needs (12/21/2023)  Utilities: Not At Risk (12/21/2023)  Depression (PHQ2-9): Low Risk  (07/23/2019)  Financial Resource Strain: Low Risk  (10/02/2023)   Received from [facility]  Physical Activity: Insufficiently Active (08/21/2023)   [facility]  Social Connections: Moderately Isolated (12/21/2023)  Stress: No Stress Concern Present (08/21/2023)   Received from [facility]  Tobacco Use: Low Risk  (12/20/2023)     Readmission Risk Interventions    11/05/2023    3:26 PM  Readmission Risk Prevention Plan  Transportation Screening Complete  PCP or Specialist Appt within 3-5 Days Complete  Social Work Consult for Recovery Care Planning/Counseling Complete  Palliative Care Screening Not Applicable  Medication Review Oceanographer) Complete

## 2023-12-22 NOTE — Discharge Summary (Signed)
 Physician Discharge Summary   Patient: Rhonda Espinoza MRN: [id number] DOB: 11-05-1960  Admit date:     12/20/2023  Discharge date: 12/22/2023  Discharge Physician: Pennie Banter   PCP: Ladora Daniel, PA-C   Recommendations at discharge:    Follow up with Primary Care in 1-2 weeks Repeat CBC, CMP at follow up   Discharge Diagnoses: Principal Problem:   Alcohol withdrawal seizure (HCC) Active Problems:   ETOH abuse   HLD (hyperlipidemia)   Essential hypertension   Hypokalemia   Abdominal pain   Depression with anxiety  Resolved Problems:   * No resolved hospital problems. *  Hospital Course: "Rhonda Espinoza is a 63 y.o. female with medical history significant of alcohol abuse, alcoholic pancreatitis, alcohol withdrawal seizure, hypertension, hyperlipidemia, depression with anxiety, ADD, chronic pain, who presents with seizure, abdominal pain. "  See H&P for full HPI on admission & ED course.  Patient was admitted for further evaluation of alcohol withdrawal seizure. Abdominal pain improved with just supportive care.  Patient was started on phenobarbital taper on admission and is tolerating it well, with no tremors or other significant withdrawal symptoms, no reported side effects.  3/25 -- pt doing well today, tolerating diet.  She is medically stable and requesting discharge home today.   Assessment and Plan:  Alcohol withdrawal seizure and ETOH abuse: Her seizure is likely due to alcohol withdrawal.  CT head negative.  No focal neurodeficit on physical examination. --Seizure precautions --As needed Ativan for seizure --Started phenobarbital taper -- continue at d/c --Pt was counseled about importance of cutting down and then stop alcohol drinking and was encouraged to discuss with Primary Care in interested in medication to help with alcohol cravings.   HLD (hyperlipidemia) -Fenofibrate   Essential hypertension -IV hydralazine as needed -Amlodipine   Hypokalemia:  Potassium 3.2, magnesium 1.9 -Repleted potassium -Check phosphorus level   Abdominal pain: Potential differential diagnosis includes alcoholic pancreatitis and alcoholic gastritis.  No acute abdomen on physical examination. -Patient received 1 dose of Pepcid 20 mg in ED -Continue Protonix and Carafate -Lipase was normal -Treated with PRN pain meds and antiemetics, IV fluids -Clear diet was advanced to soft / low fiber -- pt tolerating well   Depression with anxiety -Continue home medications       Consultants: None Procedures performed: None  Disposition: Home Diet recommendation:  Cardiac diet DISCHARGE MEDICATION: Allergies as of 12/22/2023       Reactions   Latex Other (See Comments)   "moreso like bandages"- these cause blisters   Wound Dressings Other (See Comments)   "moreso like bandages"- these cause blisters   Valsartan-hydrochlorothiazide Other (See Comments)   Caused pancreatitis         Medication List     STOP taking these medications    DULoxetine 60 MG capsule Commonly known as: CYMBALTA   meloxicam 7.5 MG tablet Commonly known as: MOBIC   QUEtiapine 100 MG tablet Commonly known as: SEROQUEL   sucralfate 1 g tablet Commonly known as: Carafate   traMADol 50 MG tablet Commonly known as: ULTRAM   traZODone 100 MG tablet Commonly known as: DESYREL       TAKE these medications    acetaminophen 500 MG tablet Commonly known as: TYLENOL Take 1,000 mg by mouth as needed for mild pain or headache.   ALPRAZolam 1 MG tablet Commonly known as: XANAX Take 1 mg by mouth 3 (three) times daily as needed for sleep or anxiety.   amLODipine 10  MG tablet Commonly known as: NORVASC Take 1 tablet by mouth daily.   aspirin EC 81 MG tablet Take 81 mg by mouth in the morning. Swallow whole.   atorvastatin 40 MG tablet Commonly known as: LIPITOR Take 40 mg by mouth daily.   Cholecalciferol 1.25 MG (50000 UT) capsule Take 50,000 Units by mouth  once a week.   desvenlafaxine 100 MG 24 hr tablet Commonly known as: PRISTIQ Take 100 mg by mouth at bedtime.   dicyclomine 20 MG tablet Commonly known as: BENTYL Take 1 tablet (20 mg total) by mouth 2 (two) times daily.   fenofibrate 160 MG tablet Take 160 mg by mouth daily.   fexofenadine 180 MG tablet Commonly known as: ALLEGRA Take 180 mg by mouth daily as needed for allergies or rhinitis.   folic acid 1 MG tablet Commonly known as: FOLVITE Take 1 tablet (1 mg total) by mouth daily.   hyoscyamine 0.375 MG 12 hr tablet Commonly known as: LEVBID Take 0.375 mg by mouth every 12 (twelve) hours as needed.   methocarbamol 750 MG tablet Commonly known as: Robaxin-750 Take 1 tablet (750 mg total) by mouth every 6 (six) hours as needed for muscle spasms.   multivitamin with minerals tablet Take 1 tablet by mouth daily.   ondansetron 4 MG tablet Commonly known as: ZOFRAN Take 4 mg by mouth as needed for nausea or vomiting.   ondansetron 8 MG disintegrating tablet Commonly known as: ZOFRAN-ODT Take 8 mg by mouth every 8 (eight) hours as needed for nausea.   oxyCODONE 5 MG immediate release tablet Commonly known as: Roxicodone Take 1 tablet (5 mg total) by mouth every 6 (six) hours as needed for severe pain (pain score 7-10).   pantoprazole 40 MG tablet Commonly known as: PROTONIX Take 40 mg by mouth 2 (two) times daily.   PHENobarbital 32.4 MG tablet Commonly known as: LUMINAL Take 3 tablets (97.2 mg total) by mouth daily at 4 PM for 1 day, THEN 2 tablets (64.8 mg total) 3 (three) times daily for 2 days, THEN 1 tablet (32.4 mg total) 3 (three) times daily for 2 days. Start taking on: December 22, 2023   promethazine 25 MG tablet Commonly known as: PHENERGAN Take 1 tablet (25 mg total) by mouth every 6 (six) hours as needed for nausea or vomiting.   Teriparatide 620 MCG/2.48ML Sopn INJECT 20 MCG UNDER THE SKIN 1 TIME A DAY. DISCARD PEN 28 DAYS AFTER INITIAL USE    thiamine 100 MG tablet Commonly known as: VITAMIN B1 Take 1 tablet (100 mg total) by mouth daily.   triamcinolone cream 0.1 % Commonly known as: KENALOG Apply 1 Application topically 2 (two) times daily.   zolpidem 12.5 MG CR tablet Commonly known as: AMBIEN CR Take 12.5 mg by mouth at bedtime.        Discharge Exam: Filed Weights   12/21/23 0241  Weight: 98 kg   General exam: awake, alert, no acute distress HEENT: atraumatic, clear conjunctiva, anicteric sclera, moist mucus membranes, hearing grossly normal  Respiratory system: CTAB, no wheezes, rales or rhonchi, normal respiratory effort. Cardiovascular system: normal S1/S2, RRR, no JVD, murmurs, rubs, gallops, no pedal edema.   Gastrointestinal system: soft, NT, ND, no HSM felt, +bowel sounds. Central nervous system: A&O x4. no gross focal neurologic deficits, normal speech Extremities: moves all, no edema, normal tone Skin: dry, intact, normal temperature Psychiatry: normal mood, congruent affect, judgement and insight appear normal   Condition at discharge: stable  The  results of significant diagnostics from this hospitalization (including imaging, microbiology, ancillary and laboratory) are listed below for reference.   Imaging Studies: DG Chest Port 1 View Result Date: 12/20/2023 CLINICAL DATA:  Seizure EXAM: PORTABLE CHEST 1 VIEW COMPARISON:  11/05/2023 FINDINGS: Low lung volumes. Mild cardiomegaly. No acute airspace disease, pleural effusion or pneumothorax IMPRESSION: No active disease. Low lung volumes. Mild cardiomegaly. Electronically Signed   By: Jasmine Pang M.D.   On: 12/20/2023 23:31   CT Head Wo Contrast Result Date: 12/20/2023 CLINICAL DATA:  New onset seizure activity EXAM: CT HEAD WITHOUT CONTRAST TECHNIQUE: Contiguous axial images were obtained from the base of the skull through the vertex without intravenous contrast. RADIATION DOSE REDUCTION: This exam was performed according to the departmental  dose-optimization program which includes automated exposure control, adjustment of the mA and/or kV according to patient size and/or use of iterative reconstruction technique. COMPARISON:  11/02/2023 FINDINGS: Brain: No evidence of acute infarction, hemorrhage, hydrocephalus, extra-axial collection or mass lesion/mass effect. Vascular: No hyperdense vessel or unexpected calcification. Skull: Normal. Negative for fracture or focal lesion. Sinuses/Orbits: No acute finding. Other: None. IMPRESSION: No acute intracranial abnormality noted. Electronically Signed   By: Alcide Clever M.D.   On: 12/20/2023 23:29    Microbiology: Results for orders placed or performed during the hospital encounter of 11/11/23  Resp panel by RT-PCR (RSV, Flu A&B, Covid) Anterior Nasal Swab     Status: None   Collection Time: 11/11/23  9:55 AM   Specimen: Anterior Nasal Swab  Result Value Ref Range Status   SARS Coronavirus 2 by RT PCR NEGATIVE NEGATIVE Final    Comment: (NOTE) SARS-CoV-2 target nucleic acids are NOT DETECTED.  The SARS-CoV-2 RNA is generally detectable in upper respiratory specimens during the acute phase of infection. The lowest concentration of SARS-CoV-2 viral copies this assay can detect is 138 copies/mL. A negative result does not preclude SARS-Cov-2 infection and should not be used as the sole basis for treatment or other patient management decisions. A negative result may occur with  improper specimen collection/handling, submission of specimen other than nasopharyngeal swab, presence of viral mutation(s) within the areas targeted by this assay, and inadequate number of viral copies(<138 copies/mL). A negative result must be combined with clinical observations, patient history, and epidemiological information. The expected result is Negative.  Fact Sheet for Patients:  BloggerCourse.com  Fact Sheet for Healthcare Providers:   SeriousBroker.it  This test is no t yet approved or cleared by the [place] FDA and  has been authorized for detection and/or diagnosis of SARS-CoV-2 by FDA under an Emergency Use Authorization (EUA). This EUA will remain  in effect (meaning this test can be used) for the duration of the COVID-19 declaration under Section 564(b)(1) of the Act, 21 U.S.C.section 360bbb-3(b)(1), unless the authorization is terminated  or revoked sooner.       Influenza A by PCR NEGATIVE NEGATIVE Final   Influenza B by PCR NEGATIVE NEGATIVE Final    Comment: (NOTE) The Xpert Xpress SARS-CoV-2/FLU/RSV plus assay is intended as an aid in the diagnosis of influenza from Nasopharyngeal swab specimens and should not be used as a sole basis for treatment. Nasal washings and aspirates are unacceptable for Xpert Xpress SARS-CoV-2/FLU/RSV testing.  Fact Sheet for Patients: BloggerCourse.com  Fact Sheet for Healthcare Providers: SeriousBroker.it  This test is not yet approved or cleared by the [place] FDA and has been authorized for detection and/or diagnosis of SARS-CoV-2 by FDA under an Emergency Use Authorization (EUA).  This EUA will remain in effect (meaning this test can be used) for the duration of the COVID-19 declaration under Section 564(b)(1) of the Act, 21 U.S.C. section 360bbb-3(b)(1), unless the authorization is terminated or revoked.     Resp Syncytial Virus by PCR NEGATIVE NEGATIVE Final    Comment: (NOTE) Fact Sheet for Patients: BloggerCourse.com  Fact Sheet for Healthcare Providers: SeriousBroker.it  This test is not yet approved or cleared by the [place] FDA and has been authorized for detection and/or diagnosis of SARS-CoV-2 by FDA under an Emergency Use Authorization (EUA). This EUA will remain in effect (meaning this test can be used) for  the duration of the COVID-19 declaration under Section 564(b)(1) of the Act, 21 U.S.C. section 360bbb-3(b)(1), unless the authorization is terminated or revoked.  Performed at [facility], [address] [place], [place] [address]     Labs: CBC: No results for input(s): "WBC", "NEUTROABS", "HGB", "HCT", "MCV", "PLT" in the last 168 hours.  Basic Metabolic Panel: No results for input(s): "NA", "K", "CL", "CO2", "GLUCOSE", "BUN", "CREATININE", "CALCIUM", "MG", "PHOS" in the last 168 hours.  Liver Function Tests: No results for input(s): "AST", "ALT", "ALKPHOS", "BILITOT", "PROT", "ALBUMIN" in the last 168 hours.  CBG: No results for input(s): "GLUCAP" in the last 168 hours.  Discharge time spent: less than 30 minutes.  Signed: Pennie Banter, DO [facility] 12/31/2023

## 2024-02-15 ENCOUNTER — Emergency Department

## 2024-02-15 ENCOUNTER — Other Ambulatory Visit: Payer: Self-pay

## 2024-02-15 ENCOUNTER — Emergency Department
Admission: EM | Admit: 2024-02-15 | Discharge: 2024-02-16 | Disposition: A | Discharge status: Home / Self Care | Attending: Emergency Medicine | Admitting: Emergency Medicine

## 2024-02-15 DIAGNOSIS — Z79899 Other long term (current) drug therapy: Secondary | ICD-10-CM | POA: Insufficient documentation

## 2024-02-15 DIAGNOSIS — M542 Cervicalgia: Secondary | ICD-10-CM | POA: Insufficient documentation

## 2024-02-15 DIAGNOSIS — W06XXXA Fall from bed, initial encounter: Secondary | ICD-10-CM | POA: Diagnosis not present

## 2024-02-15 DIAGNOSIS — I10 Essential (primary) hypertension: Secondary | ICD-10-CM | POA: Insufficient documentation

## 2024-02-15 DIAGNOSIS — K861 Other chronic pancreatitis: Secondary | ICD-10-CM | POA: Diagnosis not present

## 2024-02-15 DIAGNOSIS — S0990XA Unspecified injury of head, initial encounter: Secondary | ICD-10-CM | POA: Insufficient documentation

## 2024-02-15 HISTORY — DX: Acute pancreatitis without necrosis or infection, unspecified: K85.90

## 2024-02-15 LAB — CBG MONITORING, ED: Glucose-Capillary: 127 mg/dL — ABNORMAL HIGH (ref 70–99)

## 2024-02-15 MED ORDER — OXYCODONE HCL 5 MG PO TABS
5.0000 mg | ORAL_TABLET | Freq: Once | ORAL | Status: AC
  Administered 2024-02-16: 5 mg via ORAL
  Filled 2024-02-15: qty 1

## 2024-02-15 MED ORDER — SODIUM CHLORIDE 0.9 % IV BOLUS
1000.0000 mL | Freq: Once | INTRAVENOUS | Status: AC
  Administered 2024-02-16: 1000 mL via INTRAVENOUS

## 2024-02-15 NOTE — ED Provider Notes (Signed)
 [facility] Provider Note    Event Date/Time   First MD Initiated Contact with Patient 02/15/24 2255     (approximate)   History   Fall, Headache, Abdominal Pain, and Loss of Consciousness   HPI  Rhonda Espinoza is a 63 year old female with history of hypertension, pancreatitis, alcohol use disorder presenting to the ER for evaluation after a fall.  Patient reached over on her nightstand to grab her medicines and accidentally struck her head on the nightstand causing her to fall out of bed laying on the floor.  Unsure if she had LOC.  Was able to get up off the floor independently.  Reports she has had some ongoing abdominal pain over the past few days, reports multiple similar episodes in the past, possibly related to pancreatitis.  Denies chest pain, shortness of breath.  Takes baby aspirin , denies other anticoagulation.     Physical Exam   Triage Vital Signs: ED Triage Vitals  Encounter Vitals Group     BP 02/15/24 2226 137/83     Systolic BP Percentile --      Diastolic BP Percentile --      Pulse Rate 02/15/24 2226 97     Resp 02/15/24 2226 20     Temp 02/15/24 2226 98.7 F (37.1 C)     Temp Source 02/15/24 2226 Oral     SpO2 02/15/24 2226 97 %     Weight 02/15/24 2231 231 lb 9.6 oz (105.1 kg)     Height 02/15/24 2231 5\' 7"  (1.702 m)     Head Circumference --      Peak Flow --      Pain Score 02/15/24 2232 9     Pain Loc --      Pain Education --      Exclude from Growth Chart --     Most recent vital signs: Vitals:   02/15/24 2226  BP: 137/83  Pulse: 97  Resp: 20  Temp: 98.7 F (37.1 C)  SpO2: 97%   Nursing notes and vital signs reviewed.  General: Adult female, sitting upright in bed, awake interactive Head: Atraumatic Back: Tenderness of the back of the neck, no tenderness of the lower midline spine Chest: Symmetric chest rise, no tenderness to palpation.  Cardiac: Regular rhythm and rate.  Respiratory: Lungs clear to  auscultation Abdomen: Soft, nondistended.  Mild tenderness mid epigastric region, remainder of abdomen nontender Pelvis: Stable in AP and lateral compression. No tenderness to palpation. MSK: No deformity to bilateral upper and lower extremity. Full range of motion to bilateral upper lower extremity. Neuro: Alert, oriented. GCS 15. 5 out of 5 strength in bilateral upper and lower extremities. Normal sensation to light touch in bilateral upper and lower extremity. Skin: No evidence of burns or lacerations.  ED Results / Procedures / Treatments   Labs (all labs ordered are listed, but only abnormal results are displayed) Labs Reviewed  LIPASE, BLOOD - Abnormal; Notable for the following components:      Result Value   Lipase 158 (*)    All other components within normal limits  CBC WITH DIFFERENTIAL/PLATELET - Abnormal; Notable for the following components:   RDW 16.4 (*)    Platelets 431 (*)    All other components within normal limits  BASIC METABOLIC PANEL WITH GFR - Abnormal; Notable for the following components:   Glucose, Bld 116 (*)    All other components within normal limits  CBG MONITORING, ED - Abnormal; Notable  for the following components:   Glucose-Capillary 127 (*)    All other components within normal limits  HEPATIC FUNCTION PANEL  CBC WITH DIFFERENTIAL/PLATELET     EKG EKG independently reviewed interpreted by myself (ER attending) demonstrates:    RADIOLOGY Imaging independently reviewed and interpreted by myself demonstrates:  CT head without acute bleed CT C-spine without acute fracture  Formal Radiology Read:  CT Head Wo Contrast Result Date: 02/15/2024 CLINICAL DATA:  Head trauma, abnormal mental status (Age 32-64y); Neck trauma, midline tenderness (Age [id number]) She was in bed and she says she was leaning over to get her meds, rolled out of bed, struck her head on the night stand. EXAM: CT HEAD WITHOUT CONTRAST CT CERVICAL SPINE WITHOUT CONTRAST  TECHNIQUE: Multidetector CT imaging of the head and cervical spine was performed following the standard protocol without intravenous contrast. Multiplanar CT image reconstructions of the cervical spine were also generated. RADIATION DOSE REDUCTION: This exam was performed according to the departmental dose-optimization program which includes automated exposure control, adjustment of the mA and/or kV according to patient size and/or use of iterative reconstruction technique. COMPARISON:  CT head 12/20/2023, CT head and C-spine 11/02/2023 FINDINGS: CT HEAD FINDINGS Brain: No evidence of large-territorial acute infarction. No parenchymal hemorrhage. No mass lesion. No extra-axial collection. No mass effect or midline shift. No hydrocephalus. Basilar cisterns are patent. Vascular: No hyperdense vessel. Skull: No acute fracture or focal lesion. Sinuses/Orbits: Paranasal sinuses and mastoid air cells are clear. The orbits are unremarkable. Other: None. CT CERVICAL SPINE FINDINGS Alignment: Grade 2 anterolisthesis of C3 on C4. Grade 1 anterolisthesis of C4 on C5 Skull base and vertebrae: Moderate severe degenerative changes of the spine at the C5-T1 levels. Severe bilateral C3-C4 osseous neural foraminal stenosis. No severe osseous central canal stenosis. No acute fracture. Similar-appearing sclerotic lesion of the T1 vertebral body. No aggressive appearing focal osseous lesion or focal pathologic process. Soft tissues and spinal canal: No prevertebral fluid or swelling. No visible canal hematoma. Upper chest: Unremarkable. Other: Atherosclerotic plaque of the aortic arch and its main branches. IMPRESSION: 1. No acute intracranial abnormality. 2. No acute displaced fracture or traumatic listhesis of the cervical spine. 3. Grade 2 anterolisthesis of C3 on C4 with severe bilateral C3-C4 osseous neural foraminal stenosis. 4.  Aortic Atherosclerosis (ICD10-I70.0). Electronically Signed   By: Morgane  Naveau M.D.   On:  02/15/2024 23:44   CT Cervical Spine Wo Contrast Result Date: 02/15/2024 CLINICAL DATA:  Head trauma, abnormal mental status (Age 63-64y); Neck trauma, midline tenderness (Age [phone]) She was in bed and she says she was leaning over to get her meds, rolled out of bed, struck her head on the night stand. EXAM: CT HEAD WITHOUT CONTRAST CT CERVICAL SPINE WITHOUT CONTRAST TECHNIQUE: Multidetector CT imaging of the head and cervical spine was performed following the standard protocol without intravenous contrast. Multiplanar CT image reconstructions of the cervical spine were also generated. RADIATION DOSE REDUCTION: This exam was performed according to the departmental dose-optimization program which includes automated exposure control, adjustment of the mA and/or kV according to patient size and/or use of iterative reconstruction technique. COMPARISON:  CT head 12/20/2023, CT head and C-spine 11/02/2023 FINDINGS: CT HEAD FINDINGS Brain: No evidence of large-territorial acute infarction. No parenchymal hemorrhage. No mass lesion. No extra-axial collection. No mass effect or midline shift. No hydrocephalus. Basilar cisterns are patent. Vascular: No hyperdense vessel. Skull: No acute fracture or focal lesion. Sinuses/Orbits: Paranasal sinuses and mastoid air cells are clear. The orbits  are unremarkable. Other: None. CT CERVICAL SPINE FINDINGS Alignment: Grade 2 anterolisthesis of C3 on C4. Grade 1 anterolisthesis of C4 on C5 Skull base and vertebrae: Moderate severe degenerative changes of the spine at the C5-T1 levels. Severe bilateral C3-C4 osseous neural foraminal stenosis. No severe osseous central canal stenosis. No acute fracture. Similar-appearing sclerotic lesion of the T1 vertebral body. No aggressive appearing focal osseous lesion or focal pathologic process. Soft tissues and spinal canal: No prevertebral fluid or swelling. No visible canal hematoma. Upper chest: Unremarkable. Other: Atherosclerotic plaque of  the aortic arch and its main branches. IMPRESSION: 1. No acute intracranial abnormality. 2. No acute displaced fracture or traumatic listhesis of the cervical spine. 3. Grade 2 anterolisthesis of C3 on C4 with severe bilateral C3-C4 osseous neural foraminal stenosis. 4.  Aortic Atherosclerosis (ICD10-I70.0). Electronically Signed   By: Morgane  Naveau M.D.   On: 02/15/2024 23:44    PROCEDURES:  Critical Care performed: No  Procedures   MEDICATIONS ORDERED IN ED: Medications  HYDROmorphone  (DILAUDID ) injection 0.5 mg (has no administration in time range)  sodium chloride  0.9 % bolus 1,000 mL (0 mLs Intravenous Stopped 02/16/24 0148)  oxyCODONE  (Oxy IR/ROXICODONE ) immediate release tablet 5 mg (5 mg Oral Given 02/16/24 0014)  HYDROmorphone  (DILAUDID ) injection 0.5 mg (0.5 mg Intravenous Given 02/16/24 0152)  ondansetron  (ZOFRAN ) injection 4 mg (4 mg Intravenous Given 02/16/24 0151)     IMPRESSION / MDM / ASSESSMENT AND PLAN / ED COURSE  I reviewed the triage vital signs and the nursing notes.  Differential diagnosis includes, but is not limited to, intracranial bleed, skull fracture, no evidence of thoracoabdominal trauma, consideration for recurrent pancreatitis  Patient's presentation is most consistent with acute presentation with potential threat to life or bodily function.  63 year old female presenting after a fall.  Stable vitals on presentation.  Labs with reassuring white blood cell count and hemoglobin.  Normal creatinine.  Lipase elevated at 158, consistent with possible mild pancreatitis. Patient with variable lipase levels in the past, has been up in the thousands previously.  Patient reassessed.  I did discuss abdominal imaging for further evaluation.Patient reports she has had many CT scans in the past and would like to avoid abdominal imaging.  Says this feels typical of her pancreatitis episodes.  Suspect likely component of chronic pancreatitis.  She was treated symptomatically  here and did feel improved.  She is comfortable discharge home.  Will DC with short course of oral pain medication.  Strict return precautions provided.  Patient discharged in stable condition.      FINAL CLINICAL IMPRESSION(S) / ED DIAGNOSES   Final diagnoses:  Closed head injury, initial encounter  Chronic pancreatitis, unspecified pancreatitis type (HCC)     Rx / DC Orders   ED Discharge Orders          Ordered    HYDROcodone -acetaminophen  (NORCO/VICODIN) 5-325 MG tablet  Every 6 hours PRN        02/16/24 0339             Note:  This document was prepared using Dragon voice recognition software and may include unintentional dictation errors.   Claria Crofts, MD 02/16/24 [phone]

## 2024-02-15 NOTE — ED Triage Notes (Addendum)
 Patient arrives from home by [facility].  She was in bed and she says she was leaning over to get her meds, rolled out of bed, struck her head on the night stand and hit the floor.  Patient does not know if she had LOC, and was able to get up from the floor independently.  She is not on blood thinners other than 81mg  aspirin .   Patient complains of abdominal pain which is chronic from pancreatitis, and she says she had a syncopal event yesterday.

## 2024-02-16 LAB — HEPATIC FUNCTION PANEL
ALT: 15 U/L (ref 0–44)
AST: 23 U/L (ref 15–41)
Albumin: 4.1 g/dL (ref 3.5–5.0)
Alkaline Phosphatase: 56 U/L (ref 38–126)
Bilirubin, Direct: <0.1 mg/dL (ref 0.0–0.2)
Total Bilirubin: 0.3 mg/dL (ref 0.0–1.2)
Total Protein: 7.4 g/dL (ref 6.5–8.1)

## 2024-02-16 LAB — CBC WITH DIFFERENTIAL/PLATELET
Abs Immature Granulocytes: 0.05 10*3/uL (ref 0.00–0.07)
Basophils Absolute: 0.1 10*3/uL (ref 0.0–0.1)
Basophils Relative: 1 %
Eosinophils Absolute: 0.1 10*3/uL (ref 0.0–0.5)
Eosinophils Relative: 2 %
HCT: 39.4 % (ref 36.0–46.0)
Hemoglobin: 12.8 g/dL (ref 12.0–15.0)
Immature Granulocytes: 1 %
Lymphocytes Relative: 29 %
Lymphs Abs: 2.4 10*3/uL (ref 0.7–4.0)
MCH: 29.4 pg (ref 26.0–34.0)
MCHC: 32.5 g/dL (ref 30.0–36.0)
MCV: 90.6 fL (ref 80.0–100.0)
Monocytes Absolute: 0.4 10*3/uL (ref 0.1–1.0)
Monocytes Relative: 5 %
Neutro Abs: 5.2 10*3/uL (ref 1.7–7.7)
Neutrophils Relative %: 62 %
Platelets: 431 10*3/uL — ABNORMAL HIGH (ref 150–400)
RBC: 4.35 MIL/uL (ref 3.87–5.11)
RDW: 16.4 % — ABNORMAL HIGH (ref 11.5–15.5)
WBC: 8.3 10*3/uL (ref 4.0–10.5)
nRBC: 0 % (ref 0.0–0.2)

## 2024-02-16 LAB — BASIC METABOLIC PANEL WITH GFR
Anion gap: 12 (ref 5–15)
BUN: 12 mg/dL (ref 8–23)
CO2: 25 mmol/L (ref 22–32)
Calcium: 8.9 mg/dL (ref 8.9–10.3)
Chloride: 99 mmol/L (ref 98–111)
Creatinine, Ser: 0.76 mg/dL (ref 0.44–1.00)
GFR, Estimated: >60 mL/min (ref >60)
Glucose, Bld: 116 mg/dL — ABNORMAL HIGH (ref 70–99)
Potassium: 3.8 mmol/L (ref 3.5–5.1)
Sodium: 136 mmol/L (ref 135–145)

## 2024-02-16 LAB — LIPASE, BLOOD: Lipase: 158 U/L — ABNORMAL HIGH (ref 11–51)

## 2024-02-16 MED ORDER — HYDROMORPHONE HCL 1 MG/ML IJ SOLN
0.5000 mg | Freq: Once | INTRAMUSCULAR | Status: AC
  Administered 2024-02-16: 0.5 mg via INTRAVENOUS
  Filled 2024-02-16: qty 0.5

## 2024-02-16 MED ORDER — HYDROCODONE-ACETAMINOPHEN 5-325 MG PO TABS: 1.0000 | ORAL_TABLET | Freq: Four times a day (QID) | ORAL | 0 refills | Status: AC | PRN

## 2024-02-16 MED ORDER — ONDANSETRON HCL 4 MG/2ML IJ SOLN
4.0000 mg | Freq: Once | INTRAMUSCULAR | Status: AC
  Administered 2024-02-16: 4 mg via INTRAVENOUS
  Filled 2024-02-16: qty 2

## 2024-02-16 NOTE — Discharge Instructions (Addendum)
 Your CT scans fortunately did not show any serious injuries.  Your blood work looks like he may have a mild pancreatitis flare.    If you have breakthrough pain, I have sent a short course of narcotic pain medication to your pharmacy. This can make you drowsy. Do not drive or operate machinery when taking this.   Return to the ER for new or worsening symptoms.

## 2024-03-28 ENCOUNTER — Inpatient Hospital Stay
Admission: EM | Admit: 2024-03-28 | Discharge: 2024-03-30 | DRG: 439 | Disposition: A | Discharge status: Home / Self Care | Attending: Internal Medicine | Admitting: Internal Medicine

## 2024-03-28 DIAGNOSIS — R079 Chest pain, unspecified: Secondary | ICD-10-CM | POA: Diagnosis present

## 2024-03-28 DIAGNOSIS — E669 Obesity, unspecified: Secondary | ICD-10-CM | POA: Diagnosis present

## 2024-03-28 DIAGNOSIS — K852 Alcohol induced acute pancreatitis without necrosis or infection: Principal | ICD-10-CM | POA: Diagnosis present

## 2024-03-28 DIAGNOSIS — F1010 Alcohol abuse, uncomplicated: Secondary | ICD-10-CM | POA: Diagnosis not present

## 2024-03-28 DIAGNOSIS — F419 Anxiety disorder, unspecified: Secondary | ICD-10-CM | POA: Diagnosis present

## 2024-03-28 DIAGNOSIS — Z91040 Latex allergy status: Secondary | ICD-10-CM

## 2024-03-28 DIAGNOSIS — K861 Other chronic pancreatitis: Secondary | ICD-10-CM | POA: Diagnosis present

## 2024-03-28 DIAGNOSIS — E860 Dehydration: Secondary | ICD-10-CM | POA: Diagnosis present

## 2024-03-28 DIAGNOSIS — E66811 Obesity, class 1: Secondary | ICD-10-CM | POA: Diagnosis present

## 2024-03-28 DIAGNOSIS — I10 Essential (primary) hypertension: Secondary | ICD-10-CM | POA: Diagnosis not present

## 2024-03-28 DIAGNOSIS — N179 Acute kidney failure, unspecified: Secondary | ICD-10-CM | POA: Diagnosis present

## 2024-03-28 DIAGNOSIS — E785 Hyperlipidemia, unspecified: Secondary | ICD-10-CM | POA: Diagnosis present

## 2024-03-28 DIAGNOSIS — G8929 Other chronic pain: Secondary | ICD-10-CM | POA: Diagnosis present

## 2024-03-28 DIAGNOSIS — Z888 Allergy status to other drugs, medicaments and biological substances status: Secondary | ICD-10-CM

## 2024-03-28 DIAGNOSIS — K859 Acute pancreatitis without necrosis or infection, unspecified: Principal | ICD-10-CM

## 2024-03-28 DIAGNOSIS — K529 Noninfective gastroenteritis and colitis, unspecified: Secondary | ICD-10-CM | POA: Diagnosis present

## 2024-03-28 DIAGNOSIS — F988 Other specified behavioral and emotional disorders with onset usually occurring in childhood and adolescence: Secondary | ICD-10-CM | POA: Diagnosis present

## 2024-03-28 DIAGNOSIS — Z6833 Body mass index (BMI) 33.0-33.9, adult: Secondary | ICD-10-CM

## 2024-03-28 DIAGNOSIS — Z7982 Long term (current) use of aspirin: Secondary | ICD-10-CM

## 2024-03-28 DIAGNOSIS — M545 Low back pain, unspecified: Secondary | ICD-10-CM | POA: Diagnosis present

## 2024-03-28 DIAGNOSIS — Z79899 Other long term (current) drug therapy: Secondary | ICD-10-CM

## 2024-03-28 DIAGNOSIS — F418 Other specified anxiety disorders: Secondary | ICD-10-CM | POA: Diagnosis present

## 2024-03-28 DIAGNOSIS — F32A Depression, unspecified: Secondary | ICD-10-CM | POA: Diagnosis present

## 2024-03-28 LAB — TRIGLYCERIDES: Triglycerides: 182 mg/dL — ABNORMAL HIGH (ref <150)

## 2024-03-28 LAB — COMPREHENSIVE METABOLIC PANEL WITH GFR
ALT: 20 U/L (ref 0–44)
AST: 37 U/L (ref 15–41)
Albumin: 4.5 g/dL (ref 3.5–5.0)
Alkaline Phosphatase: 51 U/L (ref 38–126)
Anion gap: 11 (ref 5–15)
BUN: 20 mg/dL (ref 8–23)
CO2: 25 mmol/L (ref 22–32)
Calcium: 9.8 mg/dL (ref 8.9–10.3)
Chloride: 102 mmol/L (ref 98–111)
Creatinine, Ser: 1.1 mg/dL — ABNORMAL HIGH (ref 0.44–1.00)
GFR, Estimated: 57 mL/min — ABNORMAL LOW (ref >60)
Glucose, Bld: 130 mg/dL — ABNORMAL HIGH (ref 70–99)
Potassium: 3.7 mmol/L (ref 3.5–5.1)
Sodium: 138 mmol/L (ref 135–145)
Total Bilirubin: 0.4 mg/dL (ref 0.0–1.2)
Total Protein: 8.4 g/dL — ABNORMAL HIGH (ref 6.5–8.1)

## 2024-03-28 LAB — CBC
HCT: 40.8 % (ref 36.0–46.0)
Hemoglobin: 13.6 g/dL (ref 12.0–15.0)
MCH: 30.6 pg (ref 26.0–34.0)
MCHC: 33.3 g/dL (ref 30.0–36.0)
MCV: 91.7 fL (ref 80.0–100.0)
Platelets: 361 10*3/uL (ref 150–400)
RBC: 4.45 MIL/uL (ref 3.87–5.11)
RDW: 17.5 % — ABNORMAL HIGH (ref 11.5–15.5)
WBC: 8.9 10*3/uL (ref 4.0–10.5)
nRBC: 0 % (ref 0.0–0.2)

## 2024-03-28 LAB — LIPASE, BLOOD: Lipase: 189 U/L — ABNORMAL HIGH (ref 11–51)

## 2024-03-28 MED ORDER — ATORVASTATIN CALCIUM 20 MG PO TABS
40.0000 mg | ORAL_TABLET | Freq: Every day | ORAL | Status: DC
  Administered 2024-03-29 – 2024-03-30 (×2): 40 mg via ORAL
  Filled 2024-03-28 (×2): qty 2

## 2024-03-28 MED ORDER — MORPHINE SULFATE (PF) 4 MG/ML IV SOLN
4.0000 mg | Freq: Once | INTRAVENOUS | Status: AC
  Administered 2024-03-28: 4 mg via INTRAVENOUS
  Filled 2024-03-28: qty 1

## 2024-03-28 MED ORDER — LORATADINE 10 MG PO TABS: 10.0000 mg | ORAL_TABLET | Freq: Every day | ORAL | Status: DC | PRN

## 2024-03-28 MED ORDER — ADULT MULTIVITAMIN W/MINERALS CH
1.0000 | ORAL_TABLET | Freq: Every day | ORAL | Status: DC
  Administered 2024-03-29 – 2024-03-30 (×2): 1 via ORAL
  Filled 2024-03-28 (×2): qty 1

## 2024-03-28 MED ORDER — OXYCODONE-ACETAMINOPHEN 5-325 MG PO TABS: 1.0000 | ORAL_TABLET | ORAL | Status: DC | PRN

## 2024-03-28 MED ORDER — THIAMINE HCL 100 MG/ML IJ SOLN: 100.0000 mg | Freq: Every day | INTRAMUSCULAR | Status: DC

## 2024-03-28 MED ORDER — ENOXAPARIN SODIUM 60 MG/0.6ML IJ SOSY
0.5000 mg/kg | PREFILLED_SYRINGE | INTRAMUSCULAR | Status: DC
  Administered 2024-03-29 (×2): 47.5 mg via SUBCUTANEOUS
  Filled 2024-03-28 (×2): qty 0.6

## 2024-03-28 MED ORDER — HYDRALAZINE HCL 20 MG/ML IJ SOLN: 5.0000 mg | INTRAMUSCULAR | Status: DC | PRN

## 2024-03-28 MED ORDER — LORAZEPAM 2 MG/ML IJ SOLN
0.0000 mg | Freq: Four times a day (QID) | INTRAMUSCULAR | Status: DC
  Filled 2024-03-28: qty 1

## 2024-03-28 MED ORDER — HYDROMORPHONE HCL 1 MG/ML IJ SOLN
1.0000 mg | Freq: Once | INTRAMUSCULAR | Status: AC
  Administered 2024-03-28: 1 mg via INTRAVENOUS
  Filled 2024-03-28: qty 1

## 2024-03-28 MED ORDER — VENLAFAXINE HCL ER 150 MG PO CP24
150.0000 mg | ORAL_CAPSULE | Freq: Every day | ORAL | Status: DC
  Administered 2024-03-29 – 2024-03-30 (×2): 150 mg via ORAL
  Filled 2024-03-28 (×2): qty 1

## 2024-03-28 MED ORDER — LORAZEPAM 2 MG/ML IJ SOLN: 1.0000 mg | INTRAMUSCULAR | Status: DC | PRN

## 2024-03-28 MED ORDER — LORAZEPAM 2 MG/ML IJ SOLN: 0.0000 mg | Freq: Two times a day (BID) | INTRAMUSCULAR | Status: DC

## 2024-03-28 MED ORDER — PANTOPRAZOLE SODIUM 40 MG PO TBEC
40.0000 mg | DELAYED_RELEASE_TABLET | Freq: Two times a day (BID) | ORAL | Status: DC
  Administered 2024-03-29 – 2024-03-30 (×4): 40 mg via ORAL
  Filled 2024-03-28 (×4): qty 1

## 2024-03-28 MED ORDER — ALPRAZOLAM 1 MG PO TABS: 1.0000 mg | ORAL_TABLET | Freq: Three times a day (TID) | ORAL | Status: DC | PRN

## 2024-03-28 MED ORDER — THIAMINE MONONITRATE 100 MG PO TABS
100.0000 mg | ORAL_TABLET | Freq: Every day | ORAL | Status: DC
  Administered 2024-03-29 – 2024-03-30 (×2): 100 mg via ORAL
  Filled 2024-03-28 (×2): qty 1

## 2024-03-28 MED ORDER — FENOFIBRATE 160 MG PO TABS
160.0000 mg | ORAL_TABLET | Freq: Every day | ORAL | Status: DC
  Administered 2024-03-29 – 2024-03-30 (×2): 160 mg via ORAL
  Filled 2024-03-28 (×2): qty 1

## 2024-03-28 MED ORDER — SODIUM CHLORIDE 0.9 % IV SOLN
12.5000 mg | Freq: Four times a day (QID) | INTRAVENOUS | Status: DC | PRN
  Administered 2024-03-29 (×2): 12.5 mg via INTRAVENOUS
  Filled 2024-03-28 (×2): qty 12.5

## 2024-03-28 MED ORDER — AMLODIPINE BESYLATE 10 MG PO TABS
10.0000 mg | ORAL_TABLET | Freq: Every day | ORAL | Status: DC
  Administered 2024-03-29 – 2024-03-30 (×2): 10 mg via ORAL
  Filled 2024-03-28 (×2): qty 1

## 2024-03-28 MED ORDER — ALBUTEROL SULFATE (2.5 MG/3ML) 0.083% IN NEBU: 2.5000 mg | INHALATION_SOLUTION | RESPIRATORY_TRACT | Status: DC | PRN

## 2024-03-28 MED ORDER — SODIUM CHLORIDE 0.9 % IV SOLN: INTRAVENOUS | Status: DC

## 2024-03-28 MED ORDER — ONDANSETRON HCL 4 MG/2ML IJ SOLN
4.0000 mg | Freq: Once | INTRAMUSCULAR | Status: AC
  Administered 2024-03-28: 4 mg via INTRAVENOUS
  Filled 2024-03-28: qty 2

## 2024-03-28 MED ORDER — LORAZEPAM 1 MG PO TABS: 1.0000 mg | ORAL_TABLET | ORAL | Status: DC | PRN

## 2024-03-28 MED ORDER — METHOCARBAMOL 500 MG PO TABS: 750.0000 mg | ORAL_TABLET | Freq: Four times a day (QID) | ORAL | Status: DC | PRN

## 2024-03-28 MED ORDER — ASPIRIN 81 MG PO TBEC
81.0000 mg | DELAYED_RELEASE_TABLET | Freq: Every morning | ORAL | Status: DC
  Administered 2024-03-29 – 2024-03-30 (×2): 81 mg via ORAL
  Filled 2024-03-28 (×2): qty 1

## 2024-03-28 MED ORDER — FOLIC ACID 1 MG PO TABS
1.0000 mg | ORAL_TABLET | Freq: Every day | ORAL | Status: DC
  Administered 2024-03-29 – 2024-03-30 (×2): 1 mg via ORAL
  Filled 2024-03-28 (×2): qty 1

## 2024-03-28 MED ORDER — MORPHINE SULFATE (PF) 2 MG/ML IV SOLN: 2.0000 mg | INTRAVENOUS | Status: DC | PRN

## 2024-03-28 MED ORDER — SODIUM CHLORIDE 0.9 % IV BOLUS
1000.0000 mL | Freq: Once | INTRAVENOUS | Status: AC
  Administered 2024-03-28: 1000 mL via INTRAVENOUS

## 2024-03-28 MED ORDER — ACETAMINOPHEN 325 MG PO TABS: 650.0000 mg | ORAL_TABLET | Freq: Four times a day (QID) | ORAL | Status: DC | PRN

## 2024-03-28 NOTE — Progress Notes (Signed)
 Anticoagulation monitoring(Lovenox ):  63 yo female ordered Lovenox  40 mg Q24h    Filed Weights   03/28/24 1838  Weight: 95.3 kg (210 lb)   BMI 33.9    Lab Results  Component Value Date   CREATININE 1.10 (H) 03/28/2024   CREATININE 0.76 02/15/2024   CREATININE 0.66 12/21/2023   Estimated Creatinine Clearance: 61.7 mL/min (A) (by C-G formula based on SCr of 1.1 mg/dL (H)). Hemoglobin & Hematocrit     Component Value Date/Time   HGB 13.6 03/28/2024 1931   HCT 40.8 03/28/2024 1931     Per Protocol for Patient with estCrcl > 30 ml/min and BMI > 30, will transition to Lovenox  47.5 mg Q24h.

## 2024-03-28 NOTE — ED Provider Notes (Signed)
 [facility] ([place]) Provider Note    Event Date/Time   First MD Initiated Contact with Patient 03/28/24 1910     (approximate)  History   Chief Complaint: Abdominal Pain  HPI  Rhonda Espinoza is a 63 y.o. female with a past medical history of pancreatitis, anxiety, arthritis, hypertension, hyperlipidemia, presents to the emergency department for upper abdominal pain nausea and vomiting.  According to the patient for the last 2 days or so she has been experiencing pain across the upper abdomen along with nausea and vomiting.  Patient has a history of chronic pancreatitis, states she last had a CT scan performed 2 weeks ago.  Patient states she gets exacerbations of her pain from time to time and feels that is what is going on.  Patient also states her blood pressure has been elevated.  Physical Exam   Triage Vital Signs: ED Triage Vitals  Encounter Vitals Group     BP 03/28/24 1838 (!) 191/91     Girls Systolic BP Percentile --      Girls Diastolic BP Percentile --      Boys Systolic BP Percentile --      Boys Diastolic BP Percentile --      Pulse Rate 03/28/24 1834 (!) 108     Resp 03/28/24 1834 18     Temp 03/28/24 1838 98.3 F (36.8 C)     Temp Source 03/28/24 1838 Oral     SpO2 03/28/24 1834 100 %     Weight 03/28/24 1838 210 lb (95.3 kg)     Height 03/28/24 1838 5' 6 (1.676 m)     Head Circumference --      Peak Flow --      Pain Score 03/28/24 1838 10     Pain Loc --      Pain Education --      Exclude from Growth Chart --     Most recent vital signs: Vitals:   03/28/24 1834 03/28/24 1838  BP:  (!) 191/91  Pulse: (!) 108 (!) 104  Resp: 18 13  Temp:  98.3 F (36.8 C)  SpO2: 100% 100%    General: Awake, no distress.  CV:  Good peripheral perfusion.  Regular rate and rhythm  Resp:  Normal effort.  Equal breath sounds bilaterally.  Abd:  No distention.  Soft, moderate epigastric tenderness to palpation.  Otherwise benign abdomen.  No rebound or  guarding.  ED Results / Procedures / Treatments   EKG  EKG viewed and interpreted by myself shows sinus tachycardia 105 bpm with a narrow QRS, normal axis, normal intervals, no concerning ST changes.   MEDICATIONS ORDERED IN ED: Medications  sodium chloride  0.9 % bolus 1,000 mL (1,000 mLs Intravenous New Bag/Given 03/28/24 1933)  ondansetron  (ZOFRAN ) injection 4 mg (4 mg Intravenous Given 03/28/24 1935)  morphine  (PF) 4 MG/ML injection 4 mg (4 mg Intravenous Given 03/28/24 1935)     IMPRESSION / MDM / ASSESSMENT AND PLAN / ED COURSE  I reviewed the triage vital signs and the nursing notes.  Patient's presentation is most consistent with acute presentation with potential threat to life or bodily function.  Patient presents the emergency department for upper abdominal pain nausea vomiting.  Patient has a history of chronic pain, chronic pancreatitis.  We will check labs including CBC chemistry lipase as well as urinalysis.  Will treat pain nausea and continue to closely monitor.  Patient states she just had a CT scan performed 2 weeks  ago, has an MRI scheduled upcoming.  Will attempt to avoid CT imaging depending on how the patient is feeling how she responds to treatment medication IV hydration.  Patient's CBC is reassuring with a normal white blood cell count, reassuring chemistry.  Lipase of 189.  Patient has received 2 rounds of pain medication, has received nausea medication.  Continues have abdominal pain.  Patient states this is consistent with her past episodes of pancreatitis.  Patient does not believe she go home as she has required admission multiple times in the past for her pancreatitis.  Will speak to the hospitalist for admission.  Patient had a CT scan performed 03/11/2024 showing acute pancreatitis.  No pancreatic calcifications.  FINAL CLINICAL IMPRESSION(S) / ED DIAGNOSES   Upper abdominal pain Acute on chronic pancreatitis   Note:  This document was prepared using Dragon  voice recognition software and may include unintentional dictation errors.   Dorothyann Drivers, MD 03/28/24 2256

## 2024-03-28 NOTE — ED Notes (Signed)
 EKG printed and given to MD

## 2024-03-28 NOTE — H&P (Incomplete)
 History and Physical    Rhonda Espinoza FMW:[id number] DOB: 07-Sep-1961 DOA: 03/28/2024  Referring MD/NP/PA:   PCP: Samie Frederick, PA-C   Patient coming from:  The patient is coming from home.     Chief Complaint: Abdominal pain  HPI: Rhonda Espinoza is a 63 y.o. female with medical history significant of alcohol abuse, alcoholic pancreatitis, alcohol withdrawal seizure, hypertension, hyperlipidemia, depression with anxiety, ADD, chronic pain, who presents with abdominal pain.  Patient states that he has abdominal pain for more than 2 days, which is located in the upper abdomen, constant, sharp, moderate to severe, radiated to the back, associated with multiple episodes of nonbilious nonbloody vomiting. She has little loose stool bowel movement.  He also reports some lower chest pain, which she thinks it is extended from her upper abdominal pain to the chest.  No SOB, cough, fever or chills.  No symptoms of UTI.  She also complains of lower back pain.  She states that her last drinking of alcohol was on Friday.  Ct-abdomen/pelvis added 6/13 1.  There is some indistinctness of the head of the pancreas which may be due to acute pancreatitis or sequelae of previous episodes of pancreatitis.  2.  There are no pancreatic calcifications  3.  The liver appears normal   Data reviewed independently and ED Course: pt was found to have lipase 189, liver function normal, mild AKI with creatinine 1.10, BUN 20, GFR 57 (recent baseline creatinine 0.76 on 02/15/2024), temperature normal, blood pressure 191/91--> 172/105, heart rate of 108--> 94, RR 18, oxygen saturation 91-100% on room air.  Patient is pleased in telemetry bed for observation.   EKG: I have personally reviewed.  Sinus rhythm, QTc 460, LAD, poor IV progression, mild T wave inversion in V1-V3 and in lead III/aVF.   Review of Systems:   General: no fevers, chills, no body weight gain, has poor appetite, has fatigue HEENT: no blurry vision,  hearing changes or sore throat Respiratory: no dyspnea, coughing, wheezing CV: has chest pain, no palpitations GI: has nausea, vomiting, abdominal pain, loose stool bowel movement.   GU: no dysuria, burning on urination, increased urinary frequency, hematuria  Ext: no leg edema Neuro: no unilateral weakness, numbness, or tingling, no vision change or hearing loss Skin: no rash, no skin tear. MSK: No muscle spasm, no deformity, no limitation of range of movement in spin Heme: No easy bruising.  Travel history: No recent long distant travel.   Allergy:  Allergies  Allergen Reactions  . Latex Other (See Comments)    moreso like bandages- these cause blisters  . Wound Dressings Other (See Comments)    moreso like bandages- these cause blisters  . Valsartan -Hydrochlorothiazide  Other (See Comments)    Caused pancreatitis     Past Medical History:  Diagnosis Date  . ADD (attention deficit disorder)   . Alcohol withdrawal seizure (HCC)   . Alcohol-induced pancreatitis   . Anxiety   . Arthritis   . Asthma    allergy induced per patient  . Disc disorder   . Hyperlipidemia   . Hypertension   . Pancreatitis   . Pneumonia     Past Surgical History:  Procedure Laterality Date  . CESAREAN SECTION    . CHOLECYSTECTOMY  2022  . FOOT SURGERY Left   . FRACTURE SURGERY Right 07/2020   R wrist   . HERNIA REPAIR    . KNEE ARTHROSCOPY    . KYPHOPLASTY N/A 10/13/2018   Procedure: THORACIC 12 KYPHOPLASTY;  Surgeon: Beuford Anes, MD;  Location[facility];  Service: Orthopedics;  Laterality: N/A;  . KYPHOPLASTY N/A 02/05/2021   Procedure: LUMBAR ONE  AND LUMBAR FOUR KYPHOPLASTY;  Surgeon: Beuford Anes, MD;  Location: [facility] OR;  Service: Orthopedics;  Laterality: N/A;    Social History:  reports that she has never smoked. She has never used smokeless tobacco. She reports current alcohol use of about 4.0 standard drinks of alcohol per week. She reports that she does not use  drugs.  Family History:  Family History  Problem Relation Age of Onset  . Pancreatitis Brother   . Seizures Neg Hx      Prior to Admission medications   Medication Sig Start Date End Date Taking? Authorizing Provider  acetaminophen  (TYLENOL ) 500 MG tablet Take 1,000 mg by mouth as needed for mild pain or headache.    [provider]  ALPRAZolam  (XANAX ) 1 MG tablet Take 1 mg by mouth 3 (three) times daily as needed for sleep or anxiety. 05/23/21   [provider]  amLODipine  (NORVASC ) 10 MG tablet Take 1 tablet by mouth daily. 05/06/23   [provider]  aspirin  EC 81 MG tablet Take 81 mg by mouth in the morning. Swallow whole.    [provider]  atorvastatin  (LIPITOR) 40 MG tablet Take 40 mg by mouth daily.  06/12/16   [provider]  Cholecalciferol 1.25 MG (50000 UT) capsule Take 50,000 Units by mouth once a week. 07/28/22   [provider]  desvenlafaxine  (PRISTIQ ) 100 MG 24 hr tablet Take 100 mg by mouth at bedtime.    [provider]  dicyclomine  (BENTYL ) 20 MG tablet Take 1 tablet (20 mg total) by mouth 2 (two) times daily. 11/11/23   Elnor Jayson LABOR, DO  fenofibrate  160 MG tablet Take 160 mg by mouth daily.    [provider]  fexofenadine (ALLEGRA) 180 MG tablet Take 180 mg by mouth daily as needed for allergies or rhinitis.    [provider]  folic acid  (FOLVITE ) 1 MG tablet Take 1 tablet (1 mg total) by mouth daily. Patient not taking: Reported on 08/07/2022 05/20/21   Marilee Leach, MD  hyoscyamine (LEVBID) 0.375 MG 12 hr tablet Take 0.375 mg by mouth every 12 (twelve) hours as needed. 11/04/23   [provider]  methocarbamol  (ROBAXIN -750) 750 MG tablet Take 1 tablet (750 mg total) by mouth every 6 (six) hours as needed for muscle spasms. 12/22/23   Fausto Burnard LABOR, DO  Multiple Vitamins-Minerals (MULTIVITAMIN WITH MINERALS) tablet Take 1 tablet by mouth daily.    [provider]   ondansetron  (ZOFRAN ) 4 MG tablet Take 4 mg by mouth as needed for nausea or vomiting. 11/24/22   [provider]  ondansetron  (ZOFRAN -ODT) 8 MG disintegrating tablet Take 8 mg by mouth every 8 (eight) hours as needed for nausea. 11/04/23   [provider]  oxyCODONE  (ROXICODONE ) 5 MG immediate release tablet Take 1 tablet (5 mg total) by mouth every 6 (six) hours as needed for severe pain (pain score 7-10). 12/22/23   Fausto Burnard A, DO  pantoprazole  (PROTONIX ) 40 MG tablet Take 40 mg by mouth 2 (two) times daily.    [provider]  PHENobarbital  (LUMINAL) 32.4 MG tablet Take 3 tablets (97.2 mg total) by mouth daily at 4 PM for 1 day, THEN 2 tablets (64.8 mg total) 3 (three) times daily for 2 days, THEN 1 tablet (32.4 mg total) 3 (three) times daily for 2 days.  12/22/23 12/27/23  Fausto Burnard LABOR, DO  promethazine  (PHENERGAN ) 25 MG tablet Take 1 tablet (25 mg total) by mouth every 6 (six) hours as needed for nausea or vomiting. 11/11/23   Elnor Jayson LABOR, DO  Teriparatide 620 MCG/2.48ML SOPN INJECT 20 MCG UNDER THE SKIN 1 TIME A DAY. DISCARD PEN 28 DAYS AFTER INITIAL USE 03/12/23   [provider]  thiamine  (VITAMIN B1) 100 MG tablet Take 1 tablet (100 mg total) by mouth daily. 12/23/23   Fausto Burnard LABOR, DO  triamcinolone  cream (KENALOG ) 0.1 % Apply 1 Application topically 2 (two) times daily. 10/02/23 10/01/24  [provider]  zolpidem  (AMBIEN  CR) 12.5 MG CR tablet Take 12.5 mg by mouth at bedtime. 07/16/16   [provider]    Physical Exam: Vitals:   03/28/24 1834 03/28/24 1838 03/28/24 2200 03/28/24 2300  BP:  (!) 191/91 (!) 172/105 (!) 169/96  Pulse: (!) 108 (!) 104 94 100  Resp: 18 13 14 15   Temp:  98.3 F (36.8 C)    TempSrc:  Oral    SpO2: 100% 100% 91% 93%  Weight:  95.3 kg    Height:  5' 6 (1.676 m)     General: Not in acute distress HEENT:       Eyes: PERRL, EOMI, no jaundice       ENT: No discharge from the ears and nose, no  pharynx injection, no tonsillar enlargement.        Neck: No JVD, no bruit, no mass felt. Heme: No neck lymph node enlargement. Cardiac: S1/S2, RRR, No murmurs, No gallops or rubs. Respiratory: No rales, wheezing, rhonchi or rubs. GI: Soft, nondistended, has tenderness in upper abdomen, no rebound pain, no organomegaly, BS present. GU: No hematuria Ext: No pitting leg edema bilaterally. 1+DP/PT pulse bilaterally. Musculoskeletal: No joint deformities, No joint redness or warmth, no limitation of ROM in spin. Skin: No rashes.  Neuro: Alert, oriented X3, cranial nerves II-XII grossly intact, moves all extremities normally.  Psych: Patient is not psychotic, no suicidal or hemocidal ideation.  Labs on Admission: I have personally reviewed following labs and imaging studies  CBC: Recent Labs  Lab 03/28/24 1931  WBC 8.9  HGB 13.6  HCT 40.8  MCV 91.7  PLT 361   Basic Metabolic Panel: Recent Labs  Lab 03/28/24 1931  NA 138  K 3.7  CL 102  CO2 25  GLUCOSE 130*  BUN 20  CREATININE 1.10*  CALCIUM  9.8   GFR: Estimated Creatinine Clearance: 61.7 mL/min (A) (by C-G formula based on SCr of 1.1 mg/dL (H)). Liver Function Tests: Recent Labs  Lab 03/28/24 1931  AST 37  ALT 20  ALKPHOS 51  BILITOT 0.4  PROT 8.4*  ALBUMIN 4.5   Recent Labs  Lab 03/28/24 1931  LIPASE 189*   No results for input(s): AMMONIA in the last 168 hours. Coagulation Profile: No results for input(s): INR, PROTIME in the last 168 hours. Cardiac Enzymes: No results for input(s): CKTOTAL, CKMB, CKMBINDEX, TROPONINI in the last 168 hours. BNP (last 3 results) No results for input(s): PROBNP in the last 8760 hours. HbA1C: No results for input(s): HGBA1C in the last 72 hours. CBG: No results for input(s): GLUCAP in the last 168 hours. Lipid Profile: Recent Labs    03/28/24 1927  TRIG 182*   Thyroid Function Tests: No results for input(s): TSH, T4TOTAL, FREET4, T3FREE,  THYROIDAB in the last 72 hours. Anemia Panel: No results for input(s): VITAMINB12, FOLATE, FERRITIN, TIBC, IRON,  RETICCTPCT in the last 72 hours. Urine analysis:    Component Value Date/Time   COLORURINE YELLOW (A) 12/20/2023 2346   APPEARANCEUR HAZY (A) 12/20/2023 2346   LABSPEC 1.014 12/20/2023 2346   PHURINE 6.0 12/20/2023 2346   GLUCOSEU NEGATIVE 12/20/2023 2346   HGBUR NEGATIVE 12/20/2023 2346   BILIRUBINUR NEGATIVE 12/20/2023 2346   KETONESUR 5 (A) 12/20/2023 2346   PROTEINUR 100 (A) 12/20/2023 2346   NITRITE NEGATIVE 12/20/2023 2346   LEUKOCYTESUR NEGATIVE 12/20/2023 2346   Sepsis Labs: @LABRCNTIP (procalcitonin:4,lacticidven:4) )No results found for this or any previous visit (from the past 240 hours).   Radiological Exams on Admission:   Assessment/Plan Principal Problem:   Alcoholic pancreatitis Active Problems:   Chest pain   ETOH abuse   HLD (hyperlipidemia)   Essential hypertension   AKI (acute kidney injury) (HCC)   Depression with anxiety   Obesity (BMI 30-39.9)   Lower back pain   Assessment and Plan:  Alcoholic pancreatitis: Lipase 810.  Patient had a CT scan on 6/13, which showed some indistinctness of the head of the pancreas which may be due to acute pancreatitis or sequelae of previous episodes of pancreatitis.  On intervention, patient has tenderness in the upper abdomen, but no guarding, no acute abdomen, will not repeat CT scan to avoid too much radiation  -will place in med-surg bed for observation -NPO for pancreatitis -IVF: 1LNS and then at 125 cc/hr -prn IV morphine , perocet for pain control -prn IV zofran  for nausea -IV protonix  for possible alcoholic gastritis --US -RUQ -check triglyceride level -EKG, telemetry, CE x1, to evaluate ACS  -check IgG4 -          Chest pain   ETOH abuse   HLD (hyperlipidemia)   Essential hypertension   AKI (acute kidney injury) (HCC)   Depression with anxiety   Obesity (BMI  30-39.9)   Lower back pain:   DVT ppx: SQ Heparin          SQ Lovenox   Code Status: Full code   ***  Family Communication:     not done, no family member is at bed side.              Yes, patient's    at bed side.       by phone   ***  Disposition Plan:  Anticipate discharge back to previous environment  Consults called:    Admission status and Level of care: Telemetry Medical:    for obs as inpt        Dispo: The patient is from: {From:23814}              Anticipated d/c is to: {To:23815}              Anticipated d/c date is: {Days:23816}              Patient currently {Medically stable:23817}    Severity of Illness:  {Observation/Inpatient:21159}       Date of Service 03/29/2024    Caleb Exon [facility]   If 7PM-7AM, please contact night-coverage [email or web address] 03/29/2024, 12:02 AM'

## 2024-03-28 NOTE — H&P (Incomplete)
 History and Physical    Rhonda Espinoza FMW:[id number] DOB: November 26, 1960 DOA: 03/28/2024  Referring MD/NP/PA:   PCP: Samie Frederick, PA-C   Patient coming from:  The patient is coming from home.     Chief Complaint: Abdominal pain  HPI: Rhonda Espinoza is a 63 y.o. female with medical history significant of alcohol abuse, alcoholic pancreatitis, alcohol withdrawal seizure, hypertension, hyperlipidemia, depression with anxiety, ADD, chronic pain, who presents with abdominal pain.  Patient states that he has abdominal pain for more than 2 days, which is located in the upper abdomen, constant, sharp, moderate to severe, radiated to the back, associated with multiple episodes of nonbilious nonbloody vomiting. She has little loose stool bowel movement.  He also reports some lower chest pain, which she thinks it is extended from her upper abdominal pain to the chest.  No SOB, cough, fever or chills.  No symptoms of UTI.  She also complains of lower back pain.  She states that her last drinking of alcohol was on Friday.  Ct-abdomen/pelvis added 6/13 1.  There is some indistinctness of the head of the pancreas which may be due to acute pancreatitis or sequelae of previous episodes of pancreatitis.  2.  There are no pancreatic calcifications  3.  The liver appears normal   Data reviewed independently and ED Course: pt was found to have lipase 189, liver function normal, mild AKI with creatinine 1.10, BUN 20, GFR 57 (recent baseline creatinine 0.76 on 02/15/2024), temperature normal, blood pressure 191/91--> 172/105, heart rate of 108--> 94, RR 18, oxygen saturation 91-100% on room air.  Patient is pleased in telemetry bed for observation.   EKG: I have personally reviewed.  Sinus rhythm, QTc 460, LAD, poor IV progression, mild T wave inversion in V1-V3 and in lead III/aVF.   Review of Systems:   General: no fevers, chills, no body weight gain, has poor appetite, has fatigue HEENT: no blurry vision,  hearing changes or sore throat Respiratory: no dyspnea, coughing, wheezing CV: has chest pain, no palpitations GI: has nausea, vomiting, abdominal pain, loose stool bowel movement.   GU: no dysuria, burning on urination, increased urinary frequency, hematuria  Ext: no leg edema Neuro: no unilateral weakness, numbness, or tingling, no vision change or hearing loss Skin: no rash, no skin tear. MSK: No muscle spasm, no deformity, no limitation of range of movement in spin Heme: No easy bruising.  Travel history: No recent long distant travel.   Allergy:  Allergies  Allergen Reactions   Latex Other (See Comments)    moreso like bandages- these cause blisters   Wound Dressings Other (See Comments)    moreso like bandages- these cause blisters   Valsartan -Hydrochlorothiazide  Other (See Comments)    Caused pancreatitis     Past Medical History:  Diagnosis Date   ADD (attention deficit disorder)    Alcohol withdrawal seizure (HCC)    Alcohol-induced pancreatitis    Anxiety    Arthritis    Asthma    allergy induced per patient   Disc disorder    Hyperlipidemia    Hypertension    Pancreatitis    Pneumonia     Past Surgical History:  Procedure Laterality Date   CESAREAN SECTION     CHOLECYSTECTOMY  2022   FOOT SURGERY Left    FRACTURE SURGERY Right 07/2020   R wrist    HERNIA REPAIR     KNEE ARTHROSCOPY     KYPHOPLASTY N/A 10/13/2018   Procedure: THORACIC 12 KYPHOPLASTY;  Surgeon: Beuford Anes, MD;  Location: [facility] OR;  Service: Orthopedics;  Laterality: N/A;   KYPHOPLASTY N/A 02/05/2021   Procedure: LUMBAR ONE  AND LUMBAR FOUR KYPHOPLASTY;  Surgeon: Beuford Anes, MD;  Location: [facility] OR;  Service: Orthopedics;  Laterality: N/A;    Social History:  reports that she has never smoked. She has never used smokeless tobacco. She reports current alcohol use of about 4.0 standard drinks of alcohol per week. She reports that she does not use drugs.  Family History:  Family  History  Problem Relation Age of Onset   Pancreatitis Brother    Seizures Neg Hx      Prior to Admission medications   Medication Sig Start Date End Date Taking? Authorizing Provider  acetaminophen  (TYLENOL ) 500 MG tablet Take 1,000 mg by mouth as needed for mild pain or headache.    [provider]  ALPRAZolam  (XANAX ) 1 MG tablet Take 1 mg by mouth 3 (three) times daily as needed for sleep or anxiety. 05/23/21   [provider]  amLODipine  (NORVASC ) 10 MG tablet Take 1 tablet by mouth daily. 05/06/23   [provider]  aspirin  EC 81 MG tablet Take 81 mg by mouth in the morning. Swallow whole.    [provider]  atorvastatin  (LIPITOR) 40 MG tablet Take 40 mg by mouth daily.  06/12/16   [provider]  Cholecalciferol 1.25 MG (50000 UT) capsule Take 50,000 Units by mouth once a week. 07/28/22   [provider]  desvenlafaxine  (PRISTIQ ) 100 MG 24 hr tablet Take 100 mg by mouth at bedtime.    [provider]  dicyclomine  (BENTYL ) 20 MG tablet Take 1 tablet (20 mg total) by mouth 2 (two) times daily. 11/11/23   Elnor Jayson LABOR, DO  fenofibrate  160 MG tablet Take 160 mg by mouth daily.    [provider]  fexofenadine (ALLEGRA) 180 MG tablet Take 180 mg by mouth daily as needed for allergies or rhinitis.    [provider]  folic acid  (FOLVITE ) 1 MG tablet Take 1 tablet (1 mg total) by mouth daily. Patient not taking: Reported on 08/07/2022 05/20/21   Marilee Leach, MD  hyoscyamine (LEVBID) 0.375 MG 12 hr tablet Take 0.375 mg by mouth every 12 (twelve) hours as needed. 11/04/23   [provider]  methocarbamol  (ROBAXIN -750) 750 MG tablet Take 1 tablet (750 mg total) by mouth every 6 (six) hours as needed for muscle spasms. 12/22/23   Fausto Burnard LABOR, DO  Multiple Vitamins-Minerals (MULTIVITAMIN WITH MINERALS) tablet Take 1 tablet by mouth daily.    [provider]  ondansetron  (ZOFRAN ) 4 MG tablet Take 4  mg by mouth as needed for nausea or vomiting. 11/24/22   [provider]  ondansetron  (ZOFRAN -ODT) 8 MG disintegrating tablet Take 8 mg by mouth every 8 (eight) hours as needed for nausea. 11/04/23   [provider]  oxyCODONE  (ROXICODONE ) 5 MG immediate release tablet Take 1 tablet (5 mg total) by mouth every 6 (six) hours as needed for severe pain (pain score 7-10). 12/22/23   Fausto Burnard A, DO  pantoprazole  (PROTONIX ) 40 MG tablet Take 40 mg by mouth 2 (two) times daily.    [provider]  PHENobarbital  (LUMINAL) 32.4 MG tablet Take 3 tablets (97.2 mg total) by mouth daily at 4 PM for 1 day, THEN 2 tablets (64.8 mg total) 3 (three) times daily for 2 days, THEN 1 tablet (32.4 mg total) 3 (three) times daily for 2 days.  12/22/23 12/27/23  Fausto Burnard LABOR, DO  promethazine  (PHENERGAN ) 25 MG tablet Take 1 tablet (25 mg total) by mouth every 6 (six) hours as needed for nausea or vomiting. 11/11/23   Elnor Jayson LABOR, DO  Teriparatide 620 MCG/2.48ML SOPN INJECT 20 MCG UNDER THE SKIN 1 TIME A DAY. DISCARD PEN 28 DAYS AFTER INITIAL USE 03/12/23   [provider]  thiamine  (VITAMIN B1) 100 MG tablet Take 1 tablet (100 mg total) by mouth daily. 12/23/23   Fausto Burnard LABOR, DO  triamcinolone  cream (KENALOG ) 0.1 % Apply 1 Application topically 2 (two) times daily. 10/02/23 10/01/24  [provider]  zolpidem  (AMBIEN  CR) 12.5 MG CR tablet Take 12.5 mg by mouth at bedtime. 07/16/16   [provider]    Physical Exam: Vitals:   03/28/24 1838 03/28/24 2200 03/28/24 2300 03/29/24 0006  BP: (!) 191/91 (!) 172/105 (!) 169/96   Pulse: (!) 104 94 100   Resp: 13 14 15    Temp: 98.3 F (36.8 C)   97.9 F (36.6 C)  TempSrc: Oral   Oral  SpO2: 100% 91% 93%   Weight: 95.3 kg     Height: 5' 6 (1.676 m)      General: Not in acute distress HEENT:       Eyes: PERRL, EOMI, no jaundice       ENT: No discharge from the ears and nose, no pharynx injection, no tonsillar  enlargement.        Neck: No JVD, no bruit, no mass felt. Heme: No neck lymph node enlargement. Cardiac: S1/S2, RRR, No murmurs, No gallops or rubs. Respiratory: No rales, wheezing, rhonchi or rubs. GI: Soft, nondistended, has tenderness in upper abdomen, no rebound pain, no organomegaly, BS present. GU: No hematuria Ext: No pitting leg edema bilaterally. 1+DP/PT pulse bilaterally. Musculoskeletal: No joint deformities, No joint redness or warmth, no limitation of ROM in spin. Skin: No rashes.  Neuro: Alert, oriented X3, cranial nerves II-XII grossly intact, moves all extremities normally.  Psych: Patient is not psychotic, no suicidal or hemocidal ideation.  Labs on Admission: I have personally reviewed following labs and imaging studies  CBC: Recent Labs  Lab 03/28/24 1931  WBC 8.9  HGB 13.6  HCT 40.8  MCV 91.7  PLT 361   Basic Metabolic Panel: Recent Labs  Lab 03/28/24 1931  NA 138  K 3.7  CL 102  CO2 25  GLUCOSE 130*  BUN 20  CREATININE 1.10*  CALCIUM  9.8   GFR: Estimated Creatinine Clearance: 61.7 mL/min (A) (by C-G formula based on SCr of 1.1 mg/dL (H)). Liver Function Tests: Recent Labs  Lab 03/28/24 1931  AST 37  ALT 20  ALKPHOS 51  BILITOT 0.4  PROT 8.4*  ALBUMIN 4.5   Recent Labs  Lab 03/28/24 1931  LIPASE 189*   No results for input(s): AMMONIA in the last 168 hours. Coagulation Profile: No results for input(s): INR, PROTIME in the last 168 hours. Cardiac Enzymes: No results for input(s): CKTOTAL, CKMB, CKMBINDEX, TROPONINI in the last 168 hours. BNP (last 3 results) No results for input(s): PROBNP in the last 8760 hours. HbA1C: No results for input(s): HGBA1C in the last 72 hours. CBG: No results for input(s): GLUCAP in the last 168 hours. Lipid Profile: Recent Labs    03/28/24 1927  TRIG 182*   Thyroid Function Tests: No results for input(s): TSH, T4TOTAL, FREET4, T3FREE, THYROIDAB in the last 72  hours. Anemia Panel: No results for input(s): VITAMINB12, FOLATE, FERRITIN,  TIBC, IRON, RETICCTPCT in the last 72 hours. Urine analysis:    Component Value Date/Time   COLORURINE STRAW (A) 03/28/2024 2352   APPEARANCEUR CLEAR (A) 03/28/2024 2352   LABSPEC 1.008 03/28/2024 2352   PHURINE 6.0 03/28/2024 2352   GLUCOSEU NEGATIVE 03/28/2024 2352   HGBUR NEGATIVE 03/28/2024 2352   BILIRUBINUR NEGATIVE 03/28/2024 2352   KETONESUR NEGATIVE 03/28/2024 2352   PROTEINUR NEGATIVE 03/28/2024 2352   NITRITE NEGATIVE 03/28/2024 2352   LEUKOCYTESUR NEGATIVE 03/28/2024 2352   Sepsis Labs: @LABRCNTIP (procalcitonin:4,lacticidven:4) )No results found for this or any previous visit (from the past 240 hours).   Radiological Exams on Admission:   Assessment/Plan Principal Problem:   Alcoholic pancreatitis Active Problems:   Chest pain   ETOH abuse   HLD (hyperlipidemia)   Essential hypertension   AKI (acute kidney injury) (HCC)   Depression with anxiety   Obesity (BMI 30-39.9)   Lower back pain   Assessment and Plan:  Alcoholic pancreatitis: Lipase 810.  Patient had a CT scan on 6/13, which showed some indistinctness of the head of the pancreas which may be due to acute pancreatitis or sequelae of previous episodes of pancreatitis.  On examination, patient has tenderness in the upper abdomen, but no guarding, no acute abdomen, will not repeat CT scan to avoid too much radiation  -will place in med-surg bed for observation -NPO for pancreatitis -IVF: 1L NS and then at 125 cc/hr -prn IV Dilaudid , oxycodone  for pain control -prn IV Phenergan  for nausea -check triglyceride level  Chest pain: Patient has lower chest pain, which seem to be extended from upper abdominal pain. - Trend troponin - Continue aspirin , Lipitor  ETOH abuse: Patient had alcohol withdrawal seizure recently. -Did counsel about importance of quitting alcohol use - CIWA protocol -give librium 25 mg x 1  dose  HLD (hyperlipidemia) -Fenofibrate , Lipitor  Essential hypertension -IV hydralazine  as needed - Continue amlodipine   Mild AKI (acute kidney injury) (HCC): Likely due to dehydration -Avoid using renal toxic medications - IV fluid as above  Depression with anxiety -As needed Xanax , Effexor   Obesity (BMI 30-39.9): Patient has Obesity Class I, with body weight 95.3 Kg and BMI 33.89 kg/m2.  - Encourage losing weight - Exercise and healthy diet  Lower back pain: - As needed oxycodone  and Flexeril   DVT ppx: SQ Lovenox   Code Status: Full code     Family Communication:   Yes, patient's husband   at bed side.    Disposition Plan:  Anticipate discharge back to previous environment  Consults called:  none  Admission status and Level of care: Telemetry Medical:    for obs     Dispo: The patient is from: Home              Anticipated d/c is to: Home              Anticipated d/c date is: 1 day              Patient currently is not medically stable to d/c.    Severity of Illness:  The appropriate patient status for this patient is OBSERVATION. Observation status is judged to be reasonable and necessary in order to provide the required intensity of service to ensure the patient's safety. The patient's presenting symptoms, physical exam findings, and initial radiographic and laboratory data in the context of their medical condition is felt to place them at decreased risk for further clinical deterioration. Furthermore, it is anticipated that the patient will be  medically stable for discharge from the hospital within 2 midnights of admission.        Date of Service 03/29/2024    Caleb Exon [facility]   If 7PM-7AM, please contact night-coverage [email or web address] 03/29/2024, 12:09 AM'

## 2024-03-28 NOTE — ED Triage Notes (Signed)
 Pt presents to the ED via [facility] from home. Pt is A&Ox4. EMS was called out for N/V, htn, lower back pain, dizziness, and epigastric pain. Pt does have a hx of pancreatitis.  BP 190/120 HR 100 94% RA CBG 123  One IV attempt made by this RN in left proximal FA without success.

## 2024-03-29 ENCOUNTER — Other Ambulatory Visit: Payer: Self-pay

## 2024-03-29 DIAGNOSIS — K529 Noninfective gastroenteritis and colitis, unspecified: Secondary | ICD-10-CM | POA: Diagnosis present

## 2024-03-29 DIAGNOSIS — Z888 Allergy status to other drugs, medicaments and biological substances status: Secondary | ICD-10-CM | POA: Diagnosis not present

## 2024-03-29 DIAGNOSIS — Z7982 Long term (current) use of aspirin: Secondary | ICD-10-CM | POA: Diagnosis not present

## 2024-03-29 DIAGNOSIS — I10 Essential (primary) hypertension: Secondary | ICD-10-CM | POA: Diagnosis present

## 2024-03-29 DIAGNOSIS — F418 Other specified anxiety disorders: Secondary | ICD-10-CM | POA: Diagnosis not present

## 2024-03-29 DIAGNOSIS — F1010 Alcohol abuse, uncomplicated: Secondary | ICD-10-CM | POA: Diagnosis present

## 2024-03-29 DIAGNOSIS — G8929 Other chronic pain: Secondary | ICD-10-CM | POA: Diagnosis present

## 2024-03-29 DIAGNOSIS — N179 Acute kidney failure, unspecified: Secondary | ICD-10-CM | POA: Diagnosis present

## 2024-03-29 DIAGNOSIS — Z91040 Latex allergy status: Secondary | ICD-10-CM | POA: Diagnosis not present

## 2024-03-29 DIAGNOSIS — F419 Anxiety disorder, unspecified: Secondary | ICD-10-CM | POA: Diagnosis present

## 2024-03-29 DIAGNOSIS — E66811 Obesity, class 1: Secondary | ICD-10-CM | POA: Diagnosis present

## 2024-03-29 DIAGNOSIS — K861 Other chronic pancreatitis: Secondary | ICD-10-CM | POA: Diagnosis present

## 2024-03-29 DIAGNOSIS — Z79899 Other long term (current) drug therapy: Secondary | ICD-10-CM | POA: Diagnosis not present

## 2024-03-29 DIAGNOSIS — E860 Dehydration: Secondary | ICD-10-CM | POA: Diagnosis present

## 2024-03-29 DIAGNOSIS — Z6833 Body mass index (BMI) 33.0-33.9, adult: Secondary | ICD-10-CM | POA: Diagnosis not present

## 2024-03-29 DIAGNOSIS — E785 Hyperlipidemia, unspecified: Secondary | ICD-10-CM | POA: Diagnosis present

## 2024-03-29 DIAGNOSIS — F32A Depression, unspecified: Secondary | ICD-10-CM | POA: Diagnosis present

## 2024-03-29 DIAGNOSIS — M545 Low back pain, unspecified: Secondary | ICD-10-CM | POA: Diagnosis present

## 2024-03-29 DIAGNOSIS — F988 Other specified behavioral and emotional disorders with onset usually occurring in childhood and adolescence: Secondary | ICD-10-CM | POA: Diagnosis present

## 2024-03-29 DIAGNOSIS — K852 Alcohol induced acute pancreatitis without necrosis or infection: Secondary | ICD-10-CM | POA: Diagnosis present

## 2024-03-29 LAB — URINALYSIS, COMPLETE (UACMP) WITH MICROSCOPIC
Bacteria, UA: NONE SEEN
Bilirubin Urine: NEGATIVE
Glucose, UA: NEGATIVE mg/dL
Hgb urine dipstick: NEGATIVE
Ketones, ur: NEGATIVE mg/dL
Leukocytes,Ua: NEGATIVE
Nitrite: NEGATIVE
Protein, ur: NEGATIVE mg/dL
RBC / HPF: 0 RBC/hpf (ref 0–5)
Specific Gravity, Urine: 1.008 (ref 1.005–1.030)
pH: 6 (ref 5.0–8.0)

## 2024-03-29 LAB — CBC
HCT: 36.6 % (ref 36.0–46.0)
Hemoglobin: 12 g/dL (ref 12.0–15.0)
MCH: 30.1 pg (ref 26.0–34.0)
MCHC: 32.8 g/dL (ref 30.0–36.0)
MCV: 91.7 fL (ref 80.0–100.0)
Platelets: 319 10*3/uL (ref 150–400)
RBC: 3.99 MIL/uL (ref 3.87–5.11)
RDW: 17.6 % — ABNORMAL HIGH (ref 11.5–15.5)
WBC: 7.5 10*3/uL (ref 4.0–10.5)
nRBC: 0 % (ref 0.0–0.2)

## 2024-03-29 LAB — TROPONIN I (HIGH SENSITIVITY)
Troponin I (High Sensitivity): 3 ng/L (ref <18)
Troponin I (High Sensitivity): 3 ng/L (ref <18)
Troponin I (High Sensitivity): 4 ng/L (ref <18)

## 2024-03-29 LAB — BASIC METABOLIC PANEL WITH GFR
Anion gap: 10 (ref 5–15)
BUN: 15 mg/dL (ref 8–23)
CO2: 24 mmol/L (ref 22–32)
Calcium: 8.6 mg/dL — ABNORMAL LOW (ref 8.9–10.3)
Chloride: 109 mmol/L (ref 98–111)
Creatinine, Ser: 0.95 mg/dL (ref 0.44–1.00)
GFR, Estimated: >60 mL/min (ref >60)
Glucose, Bld: 122 mg/dL — ABNORMAL HIGH (ref 70–99)
Potassium: 3.6 mmol/L (ref 3.5–5.1)
Sodium: 143 mmol/L (ref 135–145)

## 2024-03-29 LAB — LIPASE, BLOOD: Lipase: 128 U/L — ABNORMAL HIGH (ref 11–51)

## 2024-03-29 MED ORDER — CHLORDIAZEPOXIDE HCL 25 MG PO CAPS
25.0000 mg | ORAL_CAPSULE | Freq: Once | ORAL | Status: AC
  Administered 2024-03-29: 25 mg via ORAL
  Filled 2024-03-29: qty 1

## 2024-03-29 MED ORDER — HYDROMORPHONE HCL 1 MG/ML IJ SOLN
1.0000 mg | INTRAMUSCULAR | Status: DC | PRN
  Administered 2024-03-29 – 2024-03-30 (×7): 1 mg via INTRAVENOUS
  Filled 2024-03-29 (×7): qty 1

## 2024-03-29 MED ORDER — OXYCODONE HCL 5 MG PO TABS
5.0000 mg | ORAL_TABLET | ORAL | Status: DC | PRN
  Administered 2024-03-30: 5 mg via ORAL
  Filled 2024-03-29: qty 1

## 2024-03-29 MED ORDER — CYCLOBENZAPRINE HCL 5 MG PO TABS
7.5000 mg | ORAL_TABLET | Freq: Three times a day (TID) | ORAL | Status: DC
  Administered 2024-03-29 – 2024-03-30 (×5): 7.5 mg via ORAL
  Filled 2024-03-29 (×6): qty 1.5

## 2024-03-29 MED ORDER — PANCRELIPASE (LIP-PROT-AMYL) 12000-38000 UNITS PO CPEP
24000.0000 [IU] | ORAL_CAPSULE | Freq: Three times a day (TID) | ORAL | Status: DC
  Administered 2024-03-29 – 2024-03-30 (×3): 24000 [IU] via ORAL
  Filled 2024-03-29 (×5): qty 2

## 2024-03-29 MED ORDER — LACTATED RINGERS IV SOLN: INTRAVENOUS | Status: DC

## 2024-03-29 MED ORDER — HYDRALAZINE HCL 20 MG/ML IJ SOLN
10.0000 mg | INTRAMUSCULAR | Status: DC | PRN
  Filled 2024-03-29: qty 1

## 2024-03-29 NOTE — Progress Notes (Signed)
 Progress Note   Patient: Rhonda Espinoza FMW:[id number] DOB: 09/29/1961 DOA: 03/28/2024     0 DOS: the patient was seen and examined on 03/29/2024   Brief hospital course: LENNAN MALONE is a 63 y.o. female with medical history significant of alcohol abuse, alcoholic pancreatitis, alcohol withdrawal seizure, hypertension, hyperlipidemia, depression with anxiety, ADD, chronic pain, who presents with abdominal pain.  Patient had a frequent flare of acute pancreatitis during the last few years, followed by pancreatic clinic in [facility].  She also has chronic diarrhea. She has not been tolerating diet for the last 3 or 4 days.  She has a mild elevation of lipase.  She was started on IV fluids and symptomatic treatment.   Principal Problem:   Alcoholic pancreatitis Active Problems:   Chest pain   ETOH abuse   HLD (hyperlipidemia)   Essential hypertension   AKI (acute kidney injury) (HCC)   Depression with anxiety   Obesity (BMI 30-39.9)   Class 1 obesity   Acute alcoholic pancreatitis   Lower back pain   Assessment and Plan: Alcoholic pancreatitis Pancreatic insufficiency. Patient had multiple admissions due to acute pancreatitis.  She states that she has not been drinking alcohol.  Last time drinking alcohol was 2 weeks ago when she had to glass of red wine after a funeral. She has significant nausea and could not tolerate diet. Will continue gentle rehydration, symptomatic treatment. Lipase is only mildly elevated, then came down. Patient will be kept in the hospital for another day, I will start a liquid diet.  She probably can be discharged home tomorrow.   Chest pain: Patient has lower chest pain, which seem to be extended from upper abdominal pain. Troponin negative.  No concern for acute coronary D[place].   ETOH abuse: Patient had alcohol withdrawal seizure recently. On CIWA protocol.   HLD (hyperlipidemia) -Fenofibrate , Lipitor   Essential hypertension Continue  amlodipine .   Mild AKI (acute kidney injury) (HCC): Likely due to dehydration Renal function has normalized after fluids.   Depression with anxiety Continue home medicines.   Class I obesity with BMI 33.89: Diet and excise advised.     Subjective:  Patient still complaining of right upper quadrant abdominal pain.  Nausea, but no vomiting.  She did not having diarrhea today  Physical Exam: Vitals:   03/29/24 0518 03/29/24 0542 03/29/24 0728 03/29/24 0829  BP:  (!) 164/85 (!) 156/97 (!) 177/91  Pulse:  (!) 101 94 97  Resp:  16 18 18   Temp: (!) 97.4 F (36.3 C)  98.2 F (36.8 C) 97.9 F (36.6 C)  TempSrc: Oral  Oral Oral  SpO2:  96% 98% 95%  Weight:      Height:       General exam: Appears calm and comfortable  Respiratory system: Clear to auscultation. Respiratory effort normal. Cardiovascular system: S1 & S2 heard, RRR. No JVD, murmurs, rubs, gallops or clicks. No pedal edema. Gastrointestinal system: Abdomen is nondistended, soft and RUQ tender. No organomegaly or masses felt. Normal bowel sounds heard. Central nervous system: Alert and oriented. No focal neurological deficits. Extremities: Symmetric 5 x 5 power. Skin: No rashes, lesions or ulcers Psychiatry: Judgement and insight appear normal. Mood & affect appropriate.    Data Reviewed:  Review lab results.  Family Communication: None  Disposition: Status is: Inpatient Remains inpatient appropriate because: Severity of disease, IV treatment.     Time spent: 50 minutes  Author: Murvin Mana, MD 03/29/2024 12:42 PM  For on call  review [email or web address].

## 2024-03-29 NOTE — ED Notes (Signed)
 Patients 02 sats decreasing to 87 % while asleep. Patient heard snoring and seen mouth breathing. 02 2L [place] applied.

## 2024-03-29 NOTE — Plan of Care (Signed)

## 2024-03-29 NOTE — Hospital Course (Signed)
 Rhonda Espinoza is a 63 y.o. female with medical history significant of alcohol abuse, alcoholic pancreatitis, alcohol withdrawal seizure, hypertension, hyperlipidemia, depression with anxiety, ADD, chronic pain, who presents with abdominal pain.  Patient had a frequent flare of acute pancreatitis during the last few years, followed by pancreatic clinic in [facility].  She also has chronic diarrhea. She has not been tolerating diet for the last 3 or 4 days.  She has a mild elevation of lipase.  She was started on IV fluids and symptomatic treatment.

## 2024-03-30 DIAGNOSIS — K852 Alcohol induced acute pancreatitis without necrosis or infection: Secondary | ICD-10-CM | POA: Diagnosis not present

## 2024-03-30 DIAGNOSIS — F1010 Alcohol abuse, uncomplicated: Secondary | ICD-10-CM | POA: Diagnosis not present

## 2024-03-30 DIAGNOSIS — F418 Other specified anxiety disorders: Secondary | ICD-10-CM | POA: Diagnosis not present

## 2024-03-30 DIAGNOSIS — I10 Essential (primary) hypertension: Secondary | ICD-10-CM | POA: Diagnosis not present

## 2024-03-30 LAB — CBC
HCT: 36.1 % (ref 36.0–46.0)
Hemoglobin: 11.9 g/dL — ABNORMAL LOW (ref 12.0–15.0)
MCH: 30.6 pg (ref 26.0–34.0)
MCHC: 33 g/dL (ref 30.0–36.0)
MCV: 92.8 fL (ref 80.0–100.0)
Platelets: 311 10*3/uL (ref 150–400)
RBC: 3.89 MIL/uL (ref 3.87–5.11)
RDW: 17.7 % — ABNORMAL HIGH (ref 11.5–15.5)
WBC: 5.7 10*3/uL (ref 4.0–10.5)
nRBC: 0 % (ref 0.0–0.2)

## 2024-03-30 LAB — MAGNESIUM: Magnesium: 1.6 mg/dL — ABNORMAL LOW (ref 1.7–2.4)

## 2024-03-30 LAB — BASIC METABOLIC PANEL WITH GFR
Anion gap: 9 (ref 5–15)
BUN: 9 mg/dL (ref 8–23)
CO2: 24 mmol/L (ref 22–32)
Calcium: 9.1 mg/dL (ref 8.9–10.3)
Chloride: 106 mmol/L (ref 98–111)
Creatinine, Ser: 0.84 mg/dL (ref 0.44–1.00)
GFR, Estimated: >60 mL/min (ref >60)
Glucose, Bld: 110 mg/dL — ABNORMAL HIGH (ref 70–99)
Potassium: 3.6 mmol/L (ref 3.5–5.1)
Sodium: 139 mmol/L (ref 135–145)

## 2024-03-30 LAB — GLUCOSE, CAPILLARY: Glucose-Capillary: 113 mg/dL — ABNORMAL HIGH (ref 70–99)

## 2024-03-30 MED ORDER — HYDROCODONE-ACETAMINOPHEN 5-325 MG PO TABS: 1.0000 | ORAL_TABLET | Freq: Four times a day (QID) | ORAL | Status: DC | PRN

## 2024-03-30 MED ORDER — BOOST / RESOURCE BREEZE PO LIQD CUSTOM
1.0000 | Freq: Three times a day (TID) | ORAL | Status: DC
  Administered 2024-03-30: 1 via ORAL

## 2024-03-30 MED ORDER — HYDROCODONE-ACETAMINOPHEN 5-325 MG PO TABS: 1.0000 | ORAL_TABLET | Freq: Four times a day (QID) | ORAL | 0 refills | Status: DC | PRN

## 2024-03-30 NOTE — Discharge Instructions (Signed)
 Pt to keep her f/u appt with GI at [facility] as scheduled

## 2024-03-30 NOTE — Discharge Summary (Signed)
 Physician Discharge Summary   Patient: Rhonda Espinoza MRN: [id number] DOB: [birth year]  Admit date:     03/28/2024  Discharge date: 03/30/24  Discharge Physician: Leita Blanch   PCP: Samie Frederick, PA-C   Recommendations at discharge:    F/u PCP in 1-3 weeks--pt to make appt Keep your f/u Appt with GI at [facility] as before Abstain from drinking alcohol  Discharge Diagnoses: Principal Problem:   Alcoholic pancreatitis Active Problems:   Chest pain   ETOH abuse   HLD (hyperlipidemia)   Essential hypertension   AKI (acute kidney injury) (HCC)   Depression with anxiety   Obesity (BMI 30-39.9)   Class 1 obesity   Acute alcoholic pancreatitis   Lower back pain  Rhonda Espinoza is a 63 y.o. female with medical history significant of alcohol abuse, alcoholic pancreatitis, alcohol withdrawal seizure, hypertension, hyperlipidemia, depression with anxiety, ADD, chronic pain, who presents with abdominal pain.  Patient follows with G.I. at [facility] health.  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  She states that she has not been drinking alcohol.  Last time drinking alcohol was 2 weeks ago when she had to glass of red wine after a funeral. -- No signs of alcohol withdrawal. Overall improving. Switch to oral PA pain meds. Patient advised not to drink. She will follow-up with G.I. as outpatient per her routine scheduled appointment. -- Diarrhea improved. Patient tolerating PO diet.   ETOH abuse: Patient had alcohol withdrawal seizure recently. On CIWA protocol\--scoring low -- hemodynamically stable   HLD (hyperlipidemia) -Fenofibrate , Lipitor   Essential hypertension Continue amlodipine .   Mild AKI (acute kidney injury) (HCC): Likely due to dehydration Renal function has normalized after fluids. -- Improving   Depression with anxiety Continue home medicines.   Class I obesity with BMI  33.89: Diet and excise advised.   Will discharge to home. Discharge plan discussed with patient. She is agreeable. No family at bedside     Pain control - [place]  Controlled Substance Reporting System database was reviewed. and patient was instructed, not to drive, operate heavy machinery, perform activities at heights, swimming or participation in water activities or provide baby-sitting services while on Pain, Sleep and Anxiety Medications; until their outpatient Physician has advised to do so again. Also recommended to not to take more than prescribed Pain, Sleep and Anxiety Medications.  Disposition: Home Diet recommendation:  Discharge Diet Orders (From admission, onward)     Start     Ordered   03/30/24 0000  Diet - low sodium heart healthy        03/30/24 1054           Cardiac diet DISCHARGE MEDICATION: Allergies as of 03/30/2024       Reactions   Latex Other (See Comments)   moreso like bandages- these cause blisters   Wound Dressings Other (See Comments)   moreso like bandages- these cause blisters   Valsartan -hydrochlorothiazide  Other (See Comments)   Caused pancreatitis         Medication List     STOP taking these medications    folic acid  1 MG tablet Commonly known as: FOLVITE    methocarbamol  750 MG tablet Commonly known as: Robaxin -750   ondansetron  8 MG disintegrating tablet Commonly known as: ZOFRAN -ODT   oxyCODONE  5 MG immediate release tablet Commonly known as: Roxicodone    PHENobarbital  32.4 MG tablet Commonly known as: LUMINAL   zolpidem  12.5 MG CR tablet Commonly  known as: AMBIEN  CR       TAKE these medications    acetaminophen  500 MG tablet Commonly known as: TYLENOL  Take 1,000 mg by mouth as needed for mild pain or headache.   ALPRAZolam  1 MG tablet Commonly known as: XANAX  Take 1 mg by mouth 3 (three) times daily as needed for sleep or anxiety.   amLODipine  10 MG tablet Commonly known as: NORVASC  Take 1 tablet  by mouth daily.   aspirin  EC 81 MG tablet Take 81 mg by mouth in the morning. Swallow whole.   atorvastatin  40 MG tablet Commonly known as: LIPITOR Take 40 mg by mouth daily.   Cholecalciferol 1.25 MG (50000 UT) capsule Take 50,000 Units by mouth once a week.   desvenlafaxine  100 MG 24 hr tablet Commonly known as: PRISTIQ  Take 100 mg by mouth at bedtime.   dicyclomine  20 MG tablet Commonly known as: BENTYL  Take 1 tablet (20 mg total) by mouth 2 (two) times daily.   Eszopiclone 3 MG Tabs Take 3 mg by mouth at bedtime as needed.   fenofibrate  160 MG tablet Take 160 mg by mouth daily.   fexofenadine 180 MG tablet Commonly known as: ALLEGRA Take 180 mg by mouth daily as needed for allergies or rhinitis.   HYDROcodone -acetaminophen  5-325 MG tablet Commonly known as: NORCO/VICODIN Take 1 tablet by mouth every 6 (six) hours as needed for severe pain (pain score 7-10).   hyoscyamine 0.375 MG 12 hr tablet Commonly known as: LEVBID Take 0.375 mg by mouth every 12 (twelve) hours as needed.   metoprolol  succinate 25 MG 24 hr tablet Commonly known as: TOPROL -XL Take 25 mg by mouth daily.   multivitamin with minerals tablet Take 1 tablet by mouth daily.   ondansetron  4 MG tablet Commonly known as: ZOFRAN  Take 4 mg by mouth as needed for nausea or vomiting.   pantoprazole  40 MG tablet Commonly known as: PROTONIX  Take 40 mg by mouth 2 (two) times daily.   promethazine  25 MG tablet Commonly known as: PHENERGAN  Take 1 tablet (25 mg total) by mouth every 6 (six) hours as needed for nausea or vomiting.   Teriparatide 620 MCG/2.48ML Sopn INJECT 20 MCG UNDER THE SKIN 1 TIME A DAY. DISCARD PEN 28 DAYS AFTER INITIAL USE   thiamine  100 MG tablet Commonly known as: VITAMIN B1 Take 1 tablet (100 mg total) by mouth daily.   triamcinolone  cream 0.1 % Commonly known as: KENALOG  Apply 1 Application topically 2 (two) times daily.        Follow-up Information     Samie Frederick,  PA-C. Schedule an appointment as soon as possible for a visit in 1 week(s).   Specialty: Physician Assistant Contact information: [address] [place] [address] [phone]                Discharge Exam: Rhonda Espinoza   03/28/24 1838  Weight: 95.3 kg   Morbid obesity. Alert and oriented times three cardiovascular both heart sounds normal no murmur. Abdomen soft abdominal obesity. No guarding rigidity neuro- exam grossly intact  Condition at discharge: fair  The results of significant diagnostics from this hospitalization (including imaging, microbiology, ancillary and laboratory) are listed below for reference.   Imaging Studies: No results found.  Microbiology: Results for orders placed or performed during the hospital encounter of 11/11/23  Resp panel by RT-PCR (RSV, Flu A&B, Covid) Anterior Nasal Swab     Status: None   Collection Time: 11/11/23  9:55 AM  Specimen: Anterior Nasal Swab  Result Value Ref Range Status   SARS Coronavirus 2 by RT PCR NEGATIVE NEGATIVE Final    Comment: (NOTE) SARS-CoV-2 target nucleic acids are NOT DETECTED.  The SARS-CoV-2 RNA is generally detectable in upper respiratory specimens during the acute phase of infection. The lowest concentration of SARS-CoV-2 viral copies this assay can detect is 138 copies/mL. A negative result does not preclude SARS-Cov-2 infection and should not be used as the sole basis for treatment or other patient management decisions. A negative result may occur with  improper specimen collection/handling, submission of specimen other than nasopharyngeal swab, presence of viral mutation(s) within the areas targeted by this assay, and inadequate number of viral copies(<138 copies/mL). A negative result must be combined with clinical observations, patient history, and epidemiological information. The expected result is Negative.  Fact Sheet for Patients:   BloggerCourse.com  Fact Sheet for Healthcare Providers:  SeriousBroker.it  This test is no t yet approved or cleared by the [place]  FDA and  has been authorized for detection and/or diagnosis of SARS-CoV-2 by FDA under an Emergency Use Authorization (EUA). This EUA will remain  in effect (meaning this test can be used) for the duration of the COVID-19 declaration under Section 564(b)(1) of the Act, 21 U.S.C.section 360bbb-3(b)(1), unless the authorization is terminated  or revoked sooner.       Influenza A by PCR NEGATIVE NEGATIVE Final   Influenza B by PCR NEGATIVE NEGATIVE Final    Comment: (NOTE) The Xpert Xpress SARS-CoV-2/FLU/RSV plus assay is intended as an aid in the diagnosis of influenza from Nasopharyngeal swab specimens and should not be used as a sole basis for treatment. Nasal washings and aspirates are unacceptable for Xpert Xpress SARS-CoV-2/FLU/RSV testing.  Fact Sheet for Patients: BloggerCourse.com  Fact Sheet for Healthcare Providers: SeriousBroker.it  This test is not yet approved or cleared by the [place]  FDA and has been authorized for detection and/or diagnosis of SARS-CoV-2 by FDA under an Emergency Use Authorization (EUA). This EUA will remain in effect (meaning this test can be used) for the duration of the COVID-19 declaration under Section 564(b)(1) of the Act, 21 U.S.C. section 360bbb-3(b)(1), unless the authorization is terminated or revoked.     Resp Syncytial Virus by PCR NEGATIVE NEGATIVE Final    Comment: (NOTE) Fact Sheet for Patients: BloggerCourse.com  Fact Sheet for Healthcare Providers: SeriousBroker.it  This test is not yet approved or cleared by the [place]  FDA and has been authorized for detection and/or diagnosis of SARS-CoV-2 by FDA under an Emergency Use  Authorization (EUA). This EUA will remain in effect (meaning this test can be used) for the duration of the COVID-19 declaration under Section 564(b)(1) of the Act, 21 U.S.C. section 360bbb-3(b)(1), unless the authorization is terminated or revoked.  Performed at [facility], [address], [place], [place] [address]     Labs: CBC: Recent Labs  Lab 03/28/24 1931 03/29/24 0300 03/30/24 0442  WBC 8.9 7.5 5.7  HGB 13.6 12.0 11.9*  HCT 40.8 36.6 36.1  MCV 91.7 91.7 92.8  PLT 361 319 311   Basic Metabolic Panel: Recent Labs  Lab 03/28/24 1931 03/29/24 0300 03/30/24 0442  NA 138 143 139  K 3.7 3.6 3.6  CL 102 109 106  CO2 25 24 24   GLUCOSE 130* 122* 110*  BUN 20 15 9   CREATININE 1.10* 0.95 0.84  CALCIUM  9.8 8.6* 9.1  MG  --   --  1.6*   Liver Function  Tests: Recent Labs  Lab 03/28/24 1931  AST 37  ALT 20  ALKPHOS 51  BILITOT 0.4  PROT 8.4*  ALBUMIN 4.5   CBG: Recent Labs  Lab 03/30/24 0807  GLUCAP 113*    Discharge time spent: greater than 30 minutes.  Signed: Leita Blanch, MD [facility] 03/30/2024

## 2024-03-30 NOTE — TOC Transition Note (Signed)
 Transition of Care [facility]) - Discharge Note   Patient Details  Name: Rhonda Espinoza MRN: [id number] Date of Birth: December 17, 1960  Transition of Care [facility]) CM/SW Contact:  Elouise LULLA Capri, RN 03/30/2024, 5:08 PM  Clinical Narrative:     Discharge orders noted. Patient discharged to home/self care. Private transportation arranged. No identified needs.    Final next level of care: Home/Self Care Barriers to Discharge: No Barriers Identified   Patient Goals and CMS Choice    Home/self care   Discharge Placement     Home/self care           Discharge Plan and Services Additional resources added to the After Visit Summary for      Social Drivers of Health (SDOH) Interventions SDOH Screenings   Food Insecurity: No Food Insecurity (03/29/2024)  Housing: High Risk (03/29/2024)  Transportation Needs: No Transportation Needs (03/29/2024)  Utilities: Not At Risk (03/29/2024)  Depression (PHQ2-9): Low Risk  (07/23/2019)  Financial Resource Strain: Low Risk  (03/18/2024)   Received from [facility]  Physical Activity: Insufficiently Active (03/18/2024)   Received from [facility]  Social Connections: Moderately Isolated (03/29/2024)  Stress: Stress Concern Present (03/18/2024)   Received from [facility]  Tobacco Use: Low Risk  (03/28/2024)     Readmission Risk Interventions    11/05/2023    3:26 PM  Readmission Risk Prevention Plan  Transportation Screening Complete  PCP or Specialist Appt within 3-5 Days Complete  Social Work Consult for Recovery Care Planning/Counseling Complete  Palliative Care Screening Not Applicable  Medication Review [employer]) Complete

## 2024-03-30 NOTE — Plan of Care (Signed)
   Problem: Education: Goal: Knowledge of General Education information will improve Description Including pain rating scale, medication(s)/side effects and non-pharmacologic comfort measures Outcome: Progressing   Problem: Health Behavior/Discharge Planning: Goal: Ability to manage health-related needs will improve Outcome: Progressing

## 2024-05-10 ENCOUNTER — Other Ambulatory Visit: Payer: Self-pay

## 2024-05-10 ENCOUNTER — Emergency Department (HOSPITAL_COMMUNITY)

## 2024-05-10 ENCOUNTER — Inpatient Hospital Stay (HOSPITAL_COMMUNITY)
Admission: EM | Admit: 2024-05-10 | Discharge: 2024-05-13 | DRG: 440 | Disposition: A | Discharge status: Home / Self Care | Attending: Family Medicine | Admitting: Family Medicine

## 2024-05-10 ENCOUNTER — Encounter (HOSPITAL_COMMUNITY): Payer: Self-pay

## 2024-05-10 DIAGNOSIS — K859 Acute pancreatitis without necrosis or infection, unspecified: Secondary | ICD-10-CM | POA: Diagnosis not present

## 2024-05-10 DIAGNOSIS — Z1152 Encounter for screening for COVID-19: Secondary | ICD-10-CM

## 2024-05-10 DIAGNOSIS — Z7982 Long term (current) use of aspirin: Secondary | ICD-10-CM

## 2024-05-10 DIAGNOSIS — G8929 Other chronic pain: Secondary | ICD-10-CM | POA: Diagnosis present

## 2024-05-10 DIAGNOSIS — N289 Disorder of kidney and ureter, unspecified: Secondary | ICD-10-CM | POA: Diagnosis present

## 2024-05-10 DIAGNOSIS — I10 Essential (primary) hypertension: Secondary | ICD-10-CM | POA: Diagnosis present

## 2024-05-10 DIAGNOSIS — E66812 Obesity, class 2: Secondary | ICD-10-CM | POA: Diagnosis present

## 2024-05-10 DIAGNOSIS — F419 Anxiety disorder, unspecified: Secondary | ICD-10-CM | POA: Diagnosis present

## 2024-05-10 DIAGNOSIS — K760 Fatty (change of) liver, not elsewhere classified: Secondary | ICD-10-CM | POA: Diagnosis present

## 2024-05-10 DIAGNOSIS — G4700 Insomnia, unspecified: Secondary | ICD-10-CM | POA: Diagnosis present

## 2024-05-10 DIAGNOSIS — E785 Hyperlipidemia, unspecified: Secondary | ICD-10-CM | POA: Diagnosis present

## 2024-05-10 DIAGNOSIS — F32A Depression, unspecified: Secondary | ICD-10-CM | POA: Diagnosis present

## 2024-05-10 DIAGNOSIS — Z6838 Body mass index (BMI) 38.0-38.9, adult: Secondary | ICD-10-CM

## 2024-05-10 DIAGNOSIS — Z79899 Other long term (current) drug therapy: Secondary | ICD-10-CM

## 2024-05-10 DIAGNOSIS — Z91048 Other nonmedicinal substance allergy status: Secondary | ICD-10-CM

## 2024-05-10 DIAGNOSIS — Z888 Allergy status to other drugs, medicaments and biological substances status: Secondary | ICD-10-CM

## 2024-05-10 DIAGNOSIS — J45909 Unspecified asthma, uncomplicated: Secondary | ICD-10-CM | POA: Diagnosis present

## 2024-05-10 DIAGNOSIS — R7989 Other specified abnormal findings of blood chemistry: Secondary | ICD-10-CM | POA: Diagnosis present

## 2024-05-10 DIAGNOSIS — R071 Chest pain on breathing: Secondary | ICD-10-CM

## 2024-05-10 DIAGNOSIS — Z91040 Latex allergy status: Secondary | ICD-10-CM

## 2024-05-10 LAB — URINALYSIS, ROUTINE W REFLEX MICROSCOPIC
Bilirubin Urine: NEGATIVE
Glucose, UA: NEGATIVE mg/dL
Hgb urine dipstick: NEGATIVE
Ketones, ur: NEGATIVE mg/dL
Nitrite: NEGATIVE
Protein, ur: NEGATIVE mg/dL
Specific Gravity, Urine: 1.01 (ref 1.005–1.030)
pH: 7 (ref 5.0–8.0)

## 2024-05-10 LAB — COMPREHENSIVE METABOLIC PANEL WITH GFR
ALT: 25 U/L (ref 0–44)
AST: 31 U/L (ref 15–41)
Albumin: 4.2 g/dL (ref 3.5–5.0)
Alkaline Phosphatase: 68 U/L (ref 38–126)
Anion gap: 15 (ref 5–15)
BUN: 10 mg/dL (ref 8–23)
CO2: 20 mmol/L — ABNORMAL LOW (ref 22–32)
Calcium: 9.7 mg/dL (ref 8.9–10.3)
Chloride: 102 mmol/L (ref 98–111)
Creatinine, Ser: 0.92 mg/dL (ref 0.44–1.00)
GFR, Estimated: >60 mL/min (ref >60)
Glucose, Bld: 143 mg/dL — ABNORMAL HIGH (ref 70–99)
Potassium: 4.1 mmol/L (ref 3.5–5.1)
Sodium: 137 mmol/L (ref 135–145)
Total Bilirubin: 0.7 mg/dL (ref 0.0–1.2)
Total Protein: 7.1 g/dL (ref 6.5–8.1)

## 2024-05-10 LAB — ETHANOL: Alcohol, Ethyl (B): <15 mg/dL (ref <15)

## 2024-05-10 LAB — D-DIMER, QUANTITATIVE: D-Dimer, Quant: 0.71 ug{FEU}/mL — ABNORMAL HIGH (ref 0.00–0.50)

## 2024-05-10 LAB — CBC
HCT: 40.2 % (ref 36.0–46.0)
Hemoglobin: 12.9 g/dL (ref 12.0–15.0)
MCH: 31.1 pg (ref 26.0–34.0)
MCHC: 32.1 g/dL (ref 30.0–36.0)
MCV: 96.9 fL (ref 80.0–100.0)
Platelets: 435 K/uL — ABNORMAL HIGH (ref 150–400)
RBC: 4.15 MIL/uL (ref 3.87–5.11)
RDW: 15.5 % (ref 11.5–15.5)
WBC: 7.5 K/uL (ref 4.0–10.5)
nRBC: 0 % (ref 0.0–0.2)

## 2024-05-10 LAB — LIPASE, BLOOD: Lipase: 166 U/L — ABNORMAL HIGH (ref 11–51)

## 2024-05-10 LAB — TROPONIN I (HIGH SENSITIVITY): Troponin I (High Sensitivity): 3 ng/L (ref <18)

## 2024-05-10 LAB — MAGNESIUM: Magnesium: 1.7 mg/dL (ref 1.7–2.4)

## 2024-05-10 MED ORDER — HYDROMORPHONE HCL 1 MG/ML IJ SOLN
1.0000 mg | Freq: Once | INTRAMUSCULAR | Status: AC
  Administered 2024-05-10 (×2): 1 mg via INTRAVENOUS
  Filled 2024-05-10: qty 1

## 2024-05-10 MED ORDER — HYDROMORPHONE HCL 1 MG/ML IJ SOLN
0.5000 mg | Freq: Once | INTRAMUSCULAR | Status: AC
  Administered 2024-05-10 (×2): 0.5 mg via INTRAVENOUS
  Filled 2024-05-10: qty 1

## 2024-05-10 MED ORDER — SODIUM CHLORIDE 0.9 % IV BOLUS
1000.0000 mL | Freq: Once | INTRAVENOUS | Status: AC
  Administered 2024-05-10 (×2): 1000 mL via INTRAVENOUS

## 2024-05-10 MED ORDER — IOHEXOL 350 MG/ML SOLN
100.0000 mL | Freq: Once | INTRAVENOUS | Status: AC | PRN
  Administered 2024-05-10 (×2): 100 mL via INTRAVENOUS

## 2024-05-10 MED ORDER — ONDANSETRON HCL 4 MG/2ML IJ SOLN
4.0000 mg | Freq: Once | INTRAMUSCULAR | Status: AC
  Administered 2024-05-10 (×2): 4 mg via INTRAVENOUS
  Filled 2024-05-10: qty 2

## 2024-05-10 NOTE — ED Notes (Signed)
 Patient placed on 3L of oxygen.

## 2024-05-10 NOTE — ED Triage Notes (Signed)
 Pt came in via POV d/t abd pain the last week, does reports Hx of chronic pancreatitis. Has been feeling nauseous, no emesis. Has noticed a decrease in her appetite & feeling dehydrated. A/Ox4, rates her pain 7/10 during triage.

## 2024-05-10 NOTE — ED Provider Notes (Signed)
 [place] [facility] Provider Note   CSN: [id number] Arrival date & time: 05/10/24  1008     Patient presents with: Abdominal Pain   Rhonda Espinoza is a 63 y.o. female here for evaluation of abdominal pain and chest.  She has known recurrent pancreatitis due to EtOH use.  She states she will occasionally drinks alcohol.  She has been admitted for similar pain.  Pain started 1 week ago.  She has been trying liquid diet at home without relief.  She comes in today due to persistent pain.  She states she is also been having some left-sided chest wall pain.  Nonexertional, nonpleuritic in nature.  Is occasional shortness of breath.  Worse with deep breathing.  No history of PE or DVT.  She is followed with digestive health specialist for her recurrent pancreatitis.  She recently had admission at [facility] about 6 weeks ago.  No fever.  Pain will go into the middle of her back.  No history of a dissection.  No pain or swelling to lower extremities. Mild non productive cough.   HPI     Prior to Admission medications   Medication Sig Start Date End Date Taking? Authorizing Provider  acetaminophen  (TYLENOL ) 500 MG tablet Take 1,000 mg by mouth as needed for mild pain or headache.    [provider]  ALPRAZolam  (XANAX ) 1 MG tablet Take 1 mg by mouth 3 (three) times daily as needed for sleep or anxiety. 05/23/21   [provider]  amLODipine  (NORVASC ) 10 MG tablet Take 1 tablet by mouth daily. 05/06/23   [provider]  aspirin  EC 81 MG tablet Take 81 mg by mouth in the morning. Swallow whole.    [provider]  atorvastatin  (LIPITOR) 40 MG tablet Take 40 mg by mouth daily.  06/12/16   [provider]  Cholecalciferol 1.25 MG (50000 UT) capsule Take 50,000 Units by mouth once a week. 07/28/22   [provider]  desvenlafaxine  (PRISTIQ ) 100 MG 24 hr tablet Take 100 mg by mouth at bedtime.    [provider]  dicyclomine  (BENTYL ) 20 MG tablet Take 1 tablet (20 mg total) by mouth 2 (two) times daily. 11/11/23   Elnor Savant A, DO  Eszopiclone 3 MG TABS Take 3 mg by mouth at bedtime as needed.    [provider]  fenofibrate  160 MG tablet Take 160 mg by mouth daily.    [provider]  fexofenadine (ALLEGRA) 180 MG tablet Take 180 mg by mouth daily as needed for allergies or rhinitis.    [provider]  HYDROcodone -acetaminophen  (NORCO/VICODIN) 5-325 MG tablet Take 1 tablet by mouth every 6 (six) hours as needed for severe pain (pain score 7-10). 03/30/24   Patel, Sona, MD  hyoscyamine (LEVBID) 0.375 MG 12 hr tablet Take 0.375 mg by mouth every 12 (twelve) hours as needed. 11/04/23   [provider]  metoprolol  succinate (TOPROL -XL) 25 MG 24 hr tablet Take 25 mg by mouth daily. 12/28/23   [provider]  Multiple Vitamins-Minerals (MULTIVITAMIN WITH MINERALS) tablet Take 1 tablet by mouth daily.    [provider]  ondansetron  (ZOFRAN ) 4 MG tablet Take 4 mg by mouth as needed for nausea or vomiting. 11/24/22   [provider]  pantoprazole  (PROTONIX ) 40 MG tablet Take 40 mg by mouth 2 (two) times daily.    [provider]  promethazine  (PHENERGAN ) 25 MG tablet Take 1 tablet (25  mg total) by mouth every 6 (six) hours as needed for nausea or vomiting. 11/11/23   Elnor Savant A, DO  Teriparatide 620 MCG/2.48ML SOPN INJECT 20 MCG UNDER THE SKIN 1 TIME A DAY. DISCARD PEN 28 DAYS AFTER INITIAL USE 03/12/23   [provider]  thiamine  (VITAMIN B1) 100 MG tablet Take 1 tablet (100 mg total) by mouth daily. 12/23/23   Fausto Burnard LABOR, DO  triamcinolone  cream (KENALOG ) 0.1 % Apply 1 Application topically 2 (two) times daily. 10/02/23 10/01/24  [provider]    Allergies: Latex, Wound dressings, and Valsartan -hydrochlorothiazide     Review of Systems  Constitutional: Negative.   HENT: Negative.    Respiratory:   Positive for cough and shortness of breath.   Cardiovascular:  Positive for chest pain.  Gastrointestinal:  Positive for abdominal pain, nausea and vomiting. Negative for abdominal distention, anal bleeding, blood in stool, constipation, diarrhea and rectal pain.  Genitourinary: Negative.   Musculoskeletal: Negative.   Skin: Negative.   Neurological: Negative.   All other systems reviewed and are negative.   Updated Vital Signs BP (!) 167/95   Pulse 94   Temp 98.8 F (37.1 C) (Oral)   Resp 17   Ht 5' 7 (1.702 m)   Wt 104.3 kg   LMP 06/30/2011   SpO2 97%   BMI 36.02 kg/m   Physical Exam Vitals and nursing note reviewed.  Constitutional:      General: She is not in acute distress.    Appearance: She is well-developed. She is not ill-appearing, toxic-appearing or diaphoretic.  HENT:     Head: Normocephalic and atraumatic.  Eyes:     Pupils: Pupils are equal, round, and reactive to light.  Cardiovascular:     Rate and Rhythm: Normal rate.     Heart sounds: Normal heart sounds.  Pulmonary:     Effort: Pulmonary effort is normal. No respiratory distress.     Breath sounds: Normal breath sounds.  Abdominal:     General: Bowel sounds are normal. There is no distension.     Palpations: Abdomen is soft.     Tenderness: There is abdominal tenderness in the epigastric area. There is no right CVA tenderness, left CVA tenderness, guarding or rebound. Negative signs include Murphy's sign and McBurney's sign.  Musculoskeletal:        General: Normal range of motion.     Cervical back: Normal range of motion.  Skin:    General: Skin is warm and dry.  Neurological:     General: No focal deficit present.     Mental Status: She is alert.  Psychiatric:        Mood and Affect: Mood normal.     (all labs ordered are listed, but only abnormal results are displayed) Labs Reviewed  COMPREHENSIVE METABOLIC PANEL WITH GFR - Abnormal; Notable for the following components:      Result  Value   CO2 20 (*)    Glucose, Bld 143 (*)    All other components within normal limits  CBC - Abnormal; Notable for the following components:   Platelets 435 (*)    All other components within normal limits  URINALYSIS, ROUTINE W REFLEX MICROSCOPIC - Abnormal; Notable for the following components:   APPearance HAZY (*)    Leukocytes,Ua LARGE (*)    Bacteria, UA RARE (*)    Non Squamous Epithelial 0-5 (*)    All other components within normal limits  LIPASE, BLOOD - Abnormal; Notable for the  following components:   Lipase 166 (*)    All other components within normal limits  D-DIMER, QUANTITATIVE (NOT AT [facility]) - Abnormal; Notable for the following components:   D-Dimer, Quant 0.71 (*)    All other components within normal limits  URINE CULTURE  RESP PANEL BY RT-PCR (RSV, FLU A&B, COVID)  [id number]  ETHANOL  MAGNESIUM   TROPONIN I (HIGH SENSITIVITY)  TROPONIN I (HIGH SENSITIVITY)    EKG: EKG Interpretation Date/Time:  Tuesday May 10 2024 23:36:07 EDT Ventricular Rate:  93 PR Interval:  160 QRS Duration:  93 QT Interval:  373 QTC Calculation: 464 R Axis:   -30  Text Interpretation: Sinus rhythm Left axis deviation Low voltage, precordial leads Consider anterior infarct No significant change since last tracing Confirmed by Haze Lonni PARAS [phone]) on 05/10/2024 11:44:02 PM  Radiology: [facility] Abdomen Acute W/Chest Result Date: 05/10/2024 CLINICAL DATA:  Chest pain.  Abdominal pain. EXAM: DG ABDOMEN ACUTE WITH 1 VIEW CHEST COMPARISON:  Chest radiograph dated 12/20/2023. FINDINGS: There is no evidence of dilated bowel loops or free intraperitoneal air. No radiopaque calculi are seen. Cholecystectomy clips in the right upper quadrant. Linear scarring/atelectasis in the right midlung zone. No focal consolidation, pleural effusion, or pneumothorax. Heart size and mediastinal contours are within normal limits. Prior vertebral augmentation again noted at T12, L1, and L4. Dextrocurvature  of the mid lumbar spine. IMPRESSION: 1. Negative abdominal radiographs. 2. Linear scarring/atelectasis in the right mid lung zone. Electronically Signed   By: Harrietta Sherry M.D.   On: 05/10/2024 17:14     Procedures   Medications Ordered in the ED  sodium chloride  0.9 % bolus 1,000 mL (0 mLs Intravenous Stopped 05/10/24 2121)  HYDROmorphone  (DILAUDID ) injection 0.5 mg (0.5 mg Intravenous Given 05/10/24 1718)  ondansetron  (ZOFRAN ) injection 4 mg (4 mg Intravenous Given 05/10/24 1717)  HYDROmorphone  (DILAUDID ) injection 1 mg (1 mg Intravenous Given 05/10/24 1836)  HYDROmorphone  (DILAUDID ) injection 0.5 mg (0.5 mg Intravenous Given 05/10/24 1519)   63 year old with recurrent alcohol-induced pancreatitis here for evaluation epigastric pain over the last week.  Has been trying liquid diet at home without relief.  Some occasional bowel nausea.  No changes in bowel movements.  She also notes some pleuritic left-sided chest pain.  No history of PE or DVT.  No recent surgery, immobilization or malignancy.  She has no clinical evidence of VTE on exam will plan on labs, imaging and reassess  Labs and imaging personally viewed and interpreted:  CBC without leukocytosis Metabolic panel without significant abnormal Magnesium  1.7 Lipase 167 UA large blood, WBC 21-50--sent for culture, no UTI symptoms Trop neg Ddimer 0.71 Ethanol <15  Patient reassessed.  Requesting additional pain medicine.  Pain to her abdomen.    Plan discussed with nursing patient still needs EKG, I have asked multiple times  EKG without ischemic change  Patient reassessed.  He developed some hypoxia initially thought due to sleepiness after pain medicine however on reassessment she is sitting up in room oxygen 89 to 90% on room air placed on 2 L.  She is pending CT scan  Delay in CT imaging due to needing additional IV for CTA.  I was not notified of this prior  Care transferred [facility] [place] who will FU on imaging and  dispo.                                  Medical Decision Making Amount and/or  Complexity of Data Reviewed Independent Historian: spouse External Data Reviewed: labs, radiology, ECG and notes. Labs: ordered. Decision-making details documented in ED Course. Radiology: ordered and independent interpretation performed. Decision-making details documented in ED Course. ECG/medicine tests: ordered and independent interpretation performed.  Risk OTC drugs. Prescription drug management. Parenteral controlled substances. Decision regarding hospitalization. Diagnosis or treatment significantly limited by social determinants of health.        Final diagnoses:  Acute pancreatitis, unspecified complication status, unspecified pancreatitis type  Chest pain on breathing    ED Discharge Orders     None          Toinette Lackie A, PA-C 05/10/24 2349    Kommor, Lum, MD 05/11/24 [phone]

## 2024-05-10 NOTE — ED Notes (Signed)
 Patient transported to CT

## 2024-05-10 NOTE — ED Notes (Signed)
 Patient transported to X-ray

## 2024-05-10 NOTE — ED Notes (Signed)
 IV team at bedside

## 2024-05-11 ENCOUNTER — Encounter (HOSPITAL_COMMUNITY): Payer: Self-pay | Admitting: Internal Medicine

## 2024-05-11 DIAGNOSIS — Z7982 Long term (current) use of aspirin: Secondary | ICD-10-CM | POA: Diagnosis not present

## 2024-05-11 DIAGNOSIS — Z91040 Latex allergy status: Secondary | ICD-10-CM | POA: Diagnosis not present

## 2024-05-11 DIAGNOSIS — Z79899 Other long term (current) drug therapy: Secondary | ICD-10-CM | POA: Diagnosis not present

## 2024-05-11 DIAGNOSIS — K760 Fatty (change of) liver, not elsewhere classified: Secondary | ICD-10-CM | POA: Diagnosis present

## 2024-05-11 DIAGNOSIS — K859 Acute pancreatitis without necrosis or infection, unspecified: Secondary | ICD-10-CM | POA: Diagnosis present

## 2024-05-11 DIAGNOSIS — K858 Other acute pancreatitis without necrosis or infection: Secondary | ICD-10-CM | POA: Diagnosis not present

## 2024-05-11 DIAGNOSIS — F32A Depression, unspecified: Secondary | ICD-10-CM | POA: Diagnosis present

## 2024-05-11 DIAGNOSIS — E66812 Obesity, class 2: Secondary | ICD-10-CM | POA: Diagnosis present

## 2024-05-11 DIAGNOSIS — G4700 Insomnia, unspecified: Secondary | ICD-10-CM | POA: Diagnosis present

## 2024-05-11 DIAGNOSIS — J45909 Unspecified asthma, uncomplicated: Secondary | ICD-10-CM | POA: Diagnosis present

## 2024-05-11 DIAGNOSIS — I10 Essential (primary) hypertension: Secondary | ICD-10-CM | POA: Diagnosis present

## 2024-05-11 DIAGNOSIS — F419 Anxiety disorder, unspecified: Secondary | ICD-10-CM | POA: Diagnosis present

## 2024-05-11 DIAGNOSIS — N289 Disorder of kidney and ureter, unspecified: Secondary | ICD-10-CM | POA: Diagnosis present

## 2024-05-11 DIAGNOSIS — Z6838 Body mass index (BMI) 38.0-38.9, adult: Secondary | ICD-10-CM | POA: Diagnosis not present

## 2024-05-11 DIAGNOSIS — R7989 Other specified abnormal findings of blood chemistry: Secondary | ICD-10-CM | POA: Diagnosis present

## 2024-05-11 DIAGNOSIS — E785 Hyperlipidemia, unspecified: Secondary | ICD-10-CM | POA: Diagnosis present

## 2024-05-11 DIAGNOSIS — Z1152 Encounter for screening for COVID-19: Secondary | ICD-10-CM | POA: Diagnosis not present

## 2024-05-11 DIAGNOSIS — Z91048 Other nonmedicinal substance allergy status: Secondary | ICD-10-CM | POA: Diagnosis not present

## 2024-05-11 DIAGNOSIS — G8929 Other chronic pain: Secondary | ICD-10-CM | POA: Diagnosis present

## 2024-05-11 DIAGNOSIS — Z888 Allergy status to other drugs, medicaments and biological substances status: Secondary | ICD-10-CM | POA: Diagnosis not present

## 2024-05-11 LAB — COMPREHENSIVE METABOLIC PANEL WITH GFR
ALT: 33 U/L (ref 0–44)
AST: 59 U/L — ABNORMAL HIGH (ref 15–41)
Albumin: 3.7 g/dL (ref 3.5–5.0)
Alkaline Phosphatase: 66 U/L (ref 38–126)
Anion gap: 10 (ref 5–15)
BUN: 8 mg/dL (ref 8–23)
CO2: 21 mmol/L — ABNORMAL LOW (ref 22–32)
Calcium: 8.7 mg/dL — ABNORMAL LOW (ref 8.9–10.3)
Chloride: 108 mmol/L (ref 98–111)
Creatinine, Ser: 0.84 mg/dL (ref 0.44–1.00)
GFR, Estimated: >60 mL/min (ref >60)
Glucose, Bld: 138 mg/dL — ABNORMAL HIGH (ref 70–99)
Potassium: 3.7 mmol/L (ref 3.5–5.1)
Sodium: 139 mmol/L (ref 135–145)
Total Bilirubin: 0.8 mg/dL (ref 0.0–1.2)
Total Protein: 7 g/dL (ref 6.5–8.1)

## 2024-05-11 LAB — CBC WITH DIFFERENTIAL/PLATELET
Abs Immature Granulocytes: 0.02 K/uL (ref 0.00–0.07)
Basophils Absolute: 0 K/uL (ref 0.0–0.1)
Basophils Relative: 0 %
Eosinophils Absolute: 0.1 K/uL (ref 0.0–0.5)
Eosinophils Relative: 2 %
HCT: 34.8 % — ABNORMAL LOW (ref 36.0–46.0)
Hemoglobin: 11.5 g/dL — ABNORMAL LOW (ref 12.0–15.0)
Immature Granulocytes: 0 %
Lymphocytes Relative: 17 %
Lymphs Abs: 1.1 K/uL (ref 0.7–4.0)
MCH: 31.2 pg (ref 26.0–34.0)
MCHC: 33 g/dL (ref 30.0–36.0)
MCV: 94.3 fL (ref 80.0–100.0)
Monocytes Absolute: 0.5 K/uL (ref 0.1–1.0)
Monocytes Relative: 8 %
Neutro Abs: 4.7 K/uL (ref 1.7–7.7)
Neutrophils Relative %: 73 %
Platelets: 357 K/uL (ref 150–400)
RBC: 3.69 MIL/uL — ABNORMAL LOW (ref 3.87–5.11)
RDW: 15.9 % — ABNORMAL HIGH (ref 11.5–15.5)
WBC: 6.4 K/uL (ref 4.0–10.5)
nRBC: 0 % (ref 0.0–0.2)

## 2024-05-11 LAB — CBC
HCT: 36.1 % (ref 36.0–46.0)
Hemoglobin: 12 g/dL (ref 12.0–15.0)
MCH: 31.4 pg (ref 26.0–34.0)
MCHC: 33.2 g/dL (ref 30.0–36.0)
MCV: 94.5 fL (ref 80.0–100.0)
Platelets: 360 K/uL (ref 150–400)
RBC: 3.82 MIL/uL — ABNORMAL LOW (ref 3.87–5.11)
RDW: 15.9 % — ABNORMAL HIGH (ref 11.5–15.5)
WBC: 6.8 K/uL (ref 4.0–10.5)
nRBC: 0 % (ref 0.0–0.2)

## 2024-05-11 LAB — TROPONIN I (HIGH SENSITIVITY): Troponin I (High Sensitivity): 3 ng/L (ref <18)

## 2024-05-11 LAB — RESP PANEL BY RT-PCR (RSV, FLU A&B, COVID)  RVPGX2
Influenza A by PCR: NEGATIVE
Influenza B by PCR: NEGATIVE
Resp Syncytial Virus by PCR: NEGATIVE
SARS Coronavirus 2 by RT PCR: NEGATIVE

## 2024-05-11 LAB — MAGNESIUM: Magnesium: 1.5 mg/dL — ABNORMAL LOW (ref 1.7–2.4)

## 2024-05-11 LAB — CREATININE, SERUM
Creatinine, Ser: 0.78 mg/dL (ref 0.44–1.00)
GFR, Estimated: >60 mL/min (ref >60)

## 2024-05-11 LAB — TRIGLYCERIDES: Triglycerides: 198 mg/dL — ABNORMAL HIGH (ref <150)

## 2024-05-11 MED ORDER — ASPIRIN 81 MG PO TBEC
81.0000 mg | DELAYED_RELEASE_TABLET | Freq: Every morning | ORAL | Status: DC
  Administered 2024-05-12 – 2024-05-13 (×2): 81 mg via ORAL
  Filled 2024-05-11 (×2): qty 1

## 2024-05-11 MED ORDER — METOPROLOL SUCCINATE ER 25 MG PO TB24
25.0000 mg | ORAL_TABLET | Freq: Every day | ORAL | Status: DC
  Administered 2024-05-12 – 2024-05-13 (×2): 25 mg via ORAL
  Filled 2024-05-11 (×3): qty 1

## 2024-05-11 MED ORDER — AMLODIPINE BESYLATE 10 MG PO TABS
10.0000 mg | ORAL_TABLET | Freq: Every day | ORAL | Status: DC
  Administered 2024-05-12 – 2024-05-13 (×2): 10 mg via ORAL
  Filled 2024-05-11 (×3): qty 1

## 2024-05-11 MED ORDER — LORAZEPAM 1 MG PO TABS: 1.0000 mg | ORAL_TABLET | ORAL | Status: DC | PRN

## 2024-05-11 MED ORDER — FOLIC ACID 1 MG PO TABS
1.0000 mg | ORAL_TABLET | Freq: Every day | ORAL | Status: DC
  Administered 2024-05-11 – 2024-05-13 (×4): 1 mg via ORAL
  Filled 2024-05-11 (×3): qty 1

## 2024-05-11 MED ORDER — LORAZEPAM 2 MG/ML IJ SOLN: 1.0000 mg | INTRAMUSCULAR | Status: DC | PRN

## 2024-05-11 MED ORDER — ACETAMINOPHEN 325 MG PO TABS: 650.0000 mg | ORAL_TABLET | Freq: Four times a day (QID) | ORAL | Status: DC | PRN

## 2024-05-11 MED ORDER — LORAZEPAM 1 MG PO TABS: 0.0000 mg | ORAL_TABLET | Freq: Two times a day (BID) | ORAL | Status: DC

## 2024-05-11 MED ORDER — THIAMINE HCL 100 MG/ML IJ SOLN: 100.0000 mg | Freq: Every day | INTRAMUSCULAR | Status: DC

## 2024-05-11 MED ORDER — HYDROMORPHONE HCL 1 MG/ML IJ SOLN
0.5000 mg | INTRAMUSCULAR | Status: DC | PRN
  Administered 2024-05-11 – 2024-05-13 (×16): 0.5 mg via INTRAVENOUS
  Filled 2024-05-11 (×2): qty 1
  Filled 2024-05-11 (×2): qty 0.5
  Filled 2024-05-11 (×2): qty 1
  Filled 2024-05-11 (×4): qty 0.5

## 2024-05-11 MED ORDER — MAGNESIUM SULFATE 2 GM/50ML IV SOLN
2.0000 g | Freq: Once | INTRAVENOUS | Status: AC
  Administered 2024-05-11 (×2): 2 g via INTRAVENOUS
  Filled 2024-05-11: qty 50

## 2024-05-11 MED ORDER — ONDANSETRON HCL 4 MG/2ML IJ SOLN: 4.0000 mg | Freq: Four times a day (QID) | INTRAMUSCULAR | Status: DC | PRN

## 2024-05-11 MED ORDER — DIAZEPAM 5 MG/ML IJ SOLN
0.0000 mg | Freq: Four times a day (QID) | INTRAMUSCULAR | Status: DC | PRN
  Administered 2024-05-11 (×2): 5 mg via INTRAVENOUS
  Filled 2024-05-11: qty 2

## 2024-05-11 MED ORDER — DIAZEPAM 5 MG PO TABS
0.0000 mg | ORAL_TABLET | Freq: Four times a day (QID) | ORAL | Status: DC | PRN
  Administered 2024-05-13: 20 mg via ORAL
  Filled 2024-05-11: qty 4

## 2024-05-11 MED ORDER — FENOFIBRATE 160 MG PO TABS
160.0000 mg | ORAL_TABLET | Freq: Every day | ORAL | Status: DC
Start: 2024-05-11 — End: 2024-05-12
  Administered 2024-05-12: 160 mg via ORAL
  Filled 2024-05-11 (×2): qty 1

## 2024-05-11 MED ORDER — ACETAMINOPHEN 650 MG RE SUPP: 650.0000 mg | Freq: Four times a day (QID) | RECTAL | Status: DC | PRN

## 2024-05-11 MED ORDER — LACTATED RINGERS IV SOLN: INTRAVENOUS | Status: DC

## 2024-05-11 MED ORDER — PANTOPRAZOLE SODIUM 40 MG PO TBEC
40.0000 mg | DELAYED_RELEASE_TABLET | Freq: Two times a day (BID) | ORAL | Status: DC
  Administered 2024-05-11 – 2024-05-13 (×5): 40 mg via ORAL
  Filled 2024-05-11 (×4): qty 1

## 2024-05-11 MED ORDER — ALPRAZOLAM 0.5 MG PO TABS
1.0000 mg | ORAL_TABLET | Freq: Three times a day (TID) | ORAL | Status: DC | PRN
  Administered 2024-05-12: 1 mg via ORAL
  Filled 2024-05-11: qty 2

## 2024-05-11 MED ORDER — NALOXONE HCL 0.4 MG/ML IJ SOLN: 0.4000 mg | INTRAMUSCULAR | Status: DC | PRN

## 2024-05-11 MED ORDER — ATORVASTATIN CALCIUM 40 MG PO TABS
40.0000 mg | ORAL_TABLET | Freq: Every day | ORAL | Status: DC
Start: 2024-05-11 — End: 2024-05-12
  Administered 2024-05-12: 40 mg via ORAL
  Filled 2024-05-11 (×2): qty 1

## 2024-05-11 MED ORDER — POLYETHYLENE GLYCOL 3350 17 G PO PACK: 17.0000 g | PACK | Freq: Every day | ORAL | Status: DC | PRN

## 2024-05-11 MED ORDER — LACTATED RINGERS IV SOLN: INTRAVENOUS | Status: AC

## 2024-05-11 MED ORDER — GABAPENTIN 300 MG PO CAPS
300.0000 mg | ORAL_CAPSULE | Freq: Every day | ORAL | Status: DC
  Administered 2024-05-11 – 2024-05-13 (×4): 300 mg via ORAL
  Filled 2024-05-11 (×3): qty 1

## 2024-05-11 MED ORDER — THIAMINE MONONITRATE 100 MG PO TABS
100.0000 mg | ORAL_TABLET | Freq: Every day | ORAL | Status: DC
  Administered 2024-05-11 – 2024-05-13 (×4): 100 mg via ORAL
  Filled 2024-05-11 (×3): qty 1

## 2024-05-11 MED ORDER — ADULT MULTIVITAMIN W/MINERALS CH
1.0000 | ORAL_TABLET | Freq: Every day | ORAL | Status: DC
  Administered 2024-05-11 – 2024-05-13 (×4): 1 via ORAL
  Filled 2024-05-11 (×3): qty 1

## 2024-05-11 MED ORDER — ZOLPIDEM TARTRATE 5 MG PO TABS
5.0000 mg | ORAL_TABLET | Freq: Every evening | ORAL | Status: DC | PRN
  Administered 2024-05-12 – 2024-05-13 (×2): 5 mg via ORAL
  Filled 2024-05-11 (×2): qty 1

## 2024-05-11 MED ORDER — LORAZEPAM 1 MG PO TABS
0.0000 mg | ORAL_TABLET | Freq: Four times a day (QID) | ORAL | Status: AC
  Administered 2024-05-11 – 2024-05-13 (×5): 1 mg via ORAL
  Filled 2024-05-11: qty 1
  Filled 2024-05-11: qty 2
  Filled 2024-05-11: qty 1
  Filled 2024-05-11: qty 4
  Filled 2024-05-11: qty 1

## 2024-05-11 MED ORDER — ENOXAPARIN SODIUM 60 MG/0.6ML IJ SOSY
50.0000 mg | PREFILLED_SYRINGE | INTRAMUSCULAR | Status: DC
  Administered 2024-05-11 – 2024-05-12 (×3): 50 mg via SUBCUTANEOUS
  Filled 2024-05-11 (×2): qty 0.6

## 2024-05-11 MED ORDER — VENLAFAXINE HCL ER 150 MG PO CP24
150.0000 mg | ORAL_CAPSULE | Freq: Every day | ORAL | Status: DC
  Administered 2024-05-12 – 2024-05-13 (×2): 150 mg via ORAL
  Filled 2024-05-11 (×2): qty 1

## 2024-05-11 MED ORDER — ENOXAPARIN SODIUM 40 MG/0.4ML IJ SOSY: 40.0000 mg | PREFILLED_SYRINGE | INTRAMUSCULAR | Status: DC

## 2024-05-11 NOTE — Progress Notes (Signed)
 CSW added substance abuse resources to patient's AVS.  Niels Portugal, MSW, LCSW Transitions of Care  Clinical Social Worker II [phone]

## 2024-05-11 NOTE — Plan of Care (Signed)
  Problem: Education: Goal: Knowledge of General Education information will improve Description: Including pain rating scale, medication(s)/side effects and non-pharmacologic comfort measures Outcome: Progressing   Problem: Pain Managment: Goal: General experience of comfort will improve and/or be controlled Outcome: Progressing

## 2024-05-11 NOTE — Discharge Instructions (Signed)

## 2024-05-11 NOTE — H&P (Addendum)
 History and Physical    Rhonda Espinoza FMW:[id number] DOB: 10/09/1960 DOA: 05/10/2024  PCP: Samie Frederick, PA-C  Patient coming from: home  I have personally briefly reviewed patient's old medical records in [facility]  Chief Complaint: abdominal pain  HPI: Rhonda Espinoza is Rhonda Espinoza 63 y.o. female with medical history significant of alcoholic pancreatitis, withdrawal seizures, anxiety, depression, hypertension, and other medical issues here with abdominal pain.  She notes symptoms that started Arelene Moroni week ago.  Thought she had Emberli Ballester stomach bug.  Had some diarrhea.  Hasn't eaten since last week, she thinks she got dehydrated and then developed abdominal pain.  She thought she could work through it, but it progressively got worse.  She describes pain as bad cramping/shooting pain.  Also back pain.  Sometimes has crampy lower abdominal pain. No nausea or vomiting.  She denies recent etoh, last time she drank was around July 26th where she had Tilmon Wisehart glass of wine after Keeghan Bialy friend passed away, but she's tried to cut it out.  Denies smoking.    ED Course: labs, imaging.  Admit to hospitalist for pancreatitis.    Review of Systems: As per HPI otherwise all other systems reviewed and are negative.  Past Medical History:  Diagnosis Date   ADD (attention deficit disorder)    Alcohol withdrawal seizure (HCC)    Alcohol-induced pancreatitis    Anxiety    Arthritis    Asthma    allergy induced per patient   Disc disorder    Hyperlipidemia    Hypertension    Pancreatitis    Pneumonia     Past Surgical History:  Procedure Laterality Date   CESAREAN SECTION     CHOLECYSTECTOMY  2022   FOOT SURGERY Left    FRACTURE SURGERY Right 07/2020   R wrist    HERNIA REPAIR     KNEE ARTHROSCOPY     KYPHOPLASTY N/Felicie Kocher 10/13/2018   Procedure: THORACIC 12 KYPHOPLASTY;  Surgeon: Beuford Anes, MD;  Location: [facility] OR;  Service: Orthopedics;  Laterality: N/Yianni Skilling;   KYPHOPLASTY N/Tahani Potier 02/05/2021   Procedure: LUMBAR ONE  AND LUMBAR  FOUR KYPHOPLASTY;  Surgeon: Beuford Anes, MD;  Location: [facility] OR;  Service: Orthopedics;  Laterality: N/Mackenzie Groom;    Social History  reports that she has never smoked. She has never used smokeless tobacco. She reports current alcohol use of about 4.0 standard drinks of alcohol per week. She reports that she does not use drugs.  Allergies  Allergen Reactions   Latex Other (See Comments)    moreso like bandages- these cause blisters   Wound Dressings Other (See Comments)    moreso like bandages- these cause blisters   Valsartan -Hydrochlorothiazide  Other (See Comments)    Caused pancreatitis     Family History  Problem Relation Age of Onset   Pancreatitis Brother    Seizures Neg Hx    Prior to Admission medications   Medication Sig Start Date End Date Taking? Authorizing Provider  acetaminophen  (TYLENOL ) 500 MG tablet Take 1,000 mg by mouth as needed for mild pain or headache.   Yes [provider]  ALPRAZolam  (XANAX ) 1 MG tablet Take 1 mg by mouth 3 (three) times daily as needed for sleep or anxiety. 05/23/21  Yes [provider]  amLODipine  (NORVASC ) 10 MG tablet Take 1 tablet by mouth daily. 05/06/23  Yes [provider]  aspirin  EC 81 MG tablet Take 81 mg by mouth in the morning. Swallow whole.   Yes [provider]  atorvastatin  (LIPITOR) 40 MG tablet Take 40 mg by mouth daily.  06/12/16  Yes [provider]  Cholecalciferol 1.25 MG (50000 UT) capsule Take 50,000 Units by mouth once Heyward Douthit week. Mondays 07/28/22  Yes [provider]  desvenlafaxine  (PRISTIQ ) 100 MG 24 hr tablet Take 100 mg by mouth at bedtime.   Yes [provider]  dicyclomine  (BENTYL ) 20 MG tablet Take 1 tablet (20 mg total) by mouth 2 (two) times daily. Patient taking differently: Take 20 mg by mouth daily as needed. 11/11/23  Yes Elnor Savant Ezmae Speers, DO  Eszopiclone 3 MG TABS Take 3 mg by mouth at bedtime as needed.   Yes [provider]  fenofibrate  160 MG  tablet Take 160 mg by mouth daily.   Yes [provider]  fexofenadine (ALLEGRA) 180 MG tablet Take 180 mg by mouth daily as needed for allergies or rhinitis.   Yes [provider]  HYDROcodone -acetaminophen  (NORCO/VICODIN) 5-325 MG tablet Take 1 tablet by mouth every 6 (six) hours as needed for severe pain (pain score 7-10). 03/30/24  Yes Patel, Sona, MD  hyoscyamine (LEVBID) 0.375 MG 12 hr tablet Take 0.375 mg by mouth every 12 (twelve) hours as needed for cramping. 11/04/23  Yes [provider]  metoprolol  succinate (TOPROL -XL) 25 MG 24 hr tablet Take 25 mg by mouth daily. 12/28/23  Yes [provider]  Multiple Vitamins-Minerals (MULTIVITAMIN WITH MINERALS) tablet Take 1 tablet by mouth daily.   Yes [provider]  pantoprazole  (PROTONIX ) 40 MG tablet Take 40 mg by mouth 2 (two) times daily.   Yes [provider]  promethazine  (PHENERGAN ) 25 MG tablet Take 1 tablet (25 mg total) by mouth every 6 (six) hours as needed for nausea or vomiting. 11/11/23  Yes Elnor Savant Temeka Pore, DO  Teriparatide 620 MCG/2.48ML SOPN INJECT 20 MCG UNDER THE SKIN 1 TIME Raeann Offner DAY. DISCARD PEN 28 DAYS AFTER INITIAL USE 03/12/23  Yes [provider]  thiamine  (VITAMIN B1) 100 MG tablet Take 1 tablet (100 mg total) by mouth daily. 12/23/23  Yes Fausto Sor Jonovan Boedecker, DO  triamcinolone  cream (KENALOG ) 0.1 % Apply 1 Application topically 2 (two) times daily. 10/02/23 10/01/24 Yes [provider]  gabapentin  (NEURONTIN ) 300 MG capsule Take 300 mg by mouth daily. 05/10/24   [provider]    Physical Exam: Vitals:   05/11/24 0600 05/11/24 0656 05/11/24 0819 05/11/24 1014  BP: (!) 162/76   (!) 156/78  Pulse: 90 87  87  Resp: 16 15  11   Temp:   97.7 F (36.5 C)   TempSrc:   Oral   SpO2: 92% 95%  97%  Weight:      Height:        Constitutional: NAD, calm, comfortable Vitals:   05/11/24 0600 05/11/24 0656 05/11/24 0819 05/11/24 1014  BP: (!) 162/76   (!)  156/78  Pulse: 90 87  87  Resp: 16 15  11   Temp:   97.7 F (36.5 C)   TempSrc:   Oral   SpO2: 92% 95%  97%  Weight:      Height:       Eyes: PERRL, lids and conjunctivae normal ENMT: Mucous membranes are moist. Neck: normal, supple Respiratory: unlabored Cardiovascular: RRR Abdomen: epigastric TTP, soft, protuberant Musculoskeletal: no clubbing / cyanosis. No joint deformity upper and lower extremities. Good ROM, no contractures. Normal muscle tone.  Skin: no rashes, lesions, ulcers. No induration Neurologic: CN 2-12 grossly intact. Sensation intact, DTR normal. Strength 5/5  in all 4.  Psychiatric: Normal judgment and insight. Alert and oriented x 3. Normal mood.   Labs on Admission: I have personally reviewed following labs and imaging studies  CBC: Recent Labs  Lab 05/10/24 1034 05/11/24 0400  WBC 7.5 6.4  NEUTROABS  --  4.7  HGB 12.9 11.5*  HCT 40.2 34.8*  MCV 96.9 94.3  PLT 435* 357    Basic Metabolic Panel: Recent Labs  Lab 05/10/24 1034 05/10/24 1715 05/11/24 0400  NA 137  --  139  K 4.1  --  3.7  CL 102  --  108  CO2 20*  --  21*  GLUCOSE 143*  --  138*  BUN 10  --  8  CREATININE 0.92  --  0.84  CALCIUM  9.7  --  8.7*  MG  --  1.7 1.5*    GFR: Estimated Creatinine Clearance: 86.3 mL/min (by C-G formula based on SCr of 0.84 mg/dL).  Liver Function Tests: Recent Labs  Lab 05/10/24 1034 05/11/24 0400  AST 31 59*  ALT 25 33  ALKPHOS 68 66  BILITOT 0.7 0.8  PROT 7.1 7.0  ALBUMIN 4.2 3.7    Urine analysis:    Component Value Date/Time   COLORURINE YELLOW 05/10/2024 1031   APPEARANCEUR HAZY (Hubert Raatz) 05/10/2024 1031   LABSPEC 1.010 05/10/2024 1031   PHURINE 7.0 05/10/2024 1031   GLUCOSEU NEGATIVE 05/10/2024 1031   HGBUR NEGATIVE 05/10/2024 1031   BILIRUBINUR NEGATIVE 05/10/2024 1031   KETONESUR NEGATIVE 05/10/2024 1031   PROTEINUR NEGATIVE 05/10/2024 1031   NITRITE NEGATIVE 05/10/2024 1031   LEUKOCYTESUR LARGE (Whitney Hillegass) 05/10/2024 1031     Radiological Exams on Admission: CT ABDOMEN PELVIS W CONTRAST Result Date: 05/11/2024 CLINICAL DATA:  Abdominal pain, history of pancreatitis EXAM: CT ABDOMEN AND PELVIS WITH CONTRAST TECHNIQUE: Multidetector CT imaging of the abdomen and pelvis was performed using the standard protocol following bolus administration of intravenous contrast. RADIATION DOSE REDUCTION: This exam was performed according to the departmental dose-optimization program which includes automated exposure control, adjustment of the mA and/or kV according to patient size and/or use of iterative reconstruction technique. CONTRAST:  OMNIPAQUE  IOHEXOL  350 MG/ML SOLN COMPARISON:  None Available. FINDINGS: Lower chest: Basilar atelectasis is noted. Hepatobiliary: Fatty infiltration of the liver is noted. The gallbladder has been surgically removed. Pancreas: Pancreas is well visualized without evidence of necrosis. Very mild peripancreatic inflammatory changes seen consistent with acute pancreatitis. Spleen: Normal in size without focal abnormality. Adrenals/Urinary Tract: Adrenal glands are within normal limits. Kidneys are well visualized bilaterally. No renal calculi or obstructive changes are seen. Simple renal cyst is noted within the right kidney. No follow-up is recommended. The bladder is within normal limits. Stomach/Bowel: The appendix is unremarkable. No obstructive or inflammatory changes of the colon are seen. Small bowel is unremarkable. Stomach again shows hiatal hernia. Vascular/Lymphatic: Aortic atherosclerosis. No enlarged abdominal or pelvic lymph nodes. Reproductive: Uterus and bilateral adnexa are unremarkable. Other: No abdominal wall hernia or abnormality. No abdominopelvic ascites. Musculoskeletal: No acute or significant osseous findings. Prior vertebral augmentation is noted at T12, L1 and L4. IMPRESSION: Changes of mild acute pancreatitis. Electronically Signed   By: Oneil Devonshire M.D.   On: 05/11/2024 00:56    CT Angio Chest PE W and/or Wo Contrast Result Date: 05/11/2024 CLINICAL DATA:  Chest pain and positive D-dimer EXAM: CT ANGIOGRAPHY CHEST WITH CONTRAST TECHNIQUE: Multidetector CT imaging of the chest was performed using the standard protocol during bolus administration of intravenous contrast. Multiplanar CT  image reconstructions and MIPs were obtained to evaluate the vascular anatomy. RADIATION DOSE REDUCTION: This exam was performed according to the departmental dose-optimization program which includes automated exposure control, adjustment of the mA and/or kV according to patient size and/or use of iterative reconstruction technique. CONTRAST:  OMNIPAQUE  IOHEXOL  350 MG/ML SOLN COMPARISON:  None Available. FINDINGS: Cardiovascular: Thoracic aorta shows Daniyah Fohl normal branching pattern. No aneurysmal dilatation or dissection is noted. Heart is mildly enlarged in size. The pulmonary artery shows Cedric Mcclaine normal branching pattern bilaterally. No intraluminal filling defect to suggest pulmonary embolism is noted. Mediastinum/Nodes: Thoracic inlet is within normal limits. No hilar or mediastinal adenopathy is noted. The esophagus is within normal limits. Moderate-sized hiatal hernia is noted. Lungs/Pleura: Lungs are well aerated bilaterally. Mild bibasilar atelectatic changes are seen. No effusion is identified. No sizable parenchymal nodule is seen. Scattered calcified granulomas are noted. Upper Abdomen: Moderate size sliding-type hiatal hernia. Fatty infiltration of the liver is noted. Musculoskeletal: Degenerative changes of the thoracic spine are noted. No rib abnormality is seen. Changes of prior vertebral augmentation at T12 is noted. Chronic T11 fracture is seen. Review of the MIP images confirms the above findings. IMPRESSION: No evidence of pulmonary emboli. Moderate-sized hiatal hernia. Bibasilar atelectatic changes. Findings consistent with prior granulomatous disease. Electronically Signed   By: Oneil Devonshire M.D.   On: 05/11/2024 00:53   DG Abdomen Acute W/Chest Result Date: 05/10/2024 CLINICAL DATA:  Chest pain.  Abdominal pain. EXAM: DG ABDOMEN ACUTE WITH 1 VIEW CHEST COMPARISON:  Chest radiograph dated 12/20/2023. FINDINGS: There is no evidence of dilated bowel loops or free intraperitoneal air. No radiopaque calculi are seen. Cholecystectomy clips in the right upper quadrant. Linear scarring/atelectasis in the right midlung zone. No focal consolidation, pleural effusion, or pneumothorax. Heart size and mediastinal contours are within normal limits. Prior vertebral augmentation again noted at T12, L1, and L4. Dextrocurvature of the mid lumbar spine. IMPRESSION: 1. Negative abdominal radiographs. 2. Linear scarring/atelectasis in the right mid lung zone. Electronically Signed   By: Harrietta Sherry M.D.   On: 05/10/2024 17:14    EKG: Independently reviewed. sinus  Assessment/Plan Principal Problem:   Acute pancreatitis Active Problems:   Pancreatitis    Assessment and Plan:  Acute Pancreatitis Hx etoh pancreatitis, though she denies recent etoh use.  She's s/p cholecystectomy.  She notes valsartan  hydrochlorothiazide  caused on of the episodes of pancreatitis (several of current meds have pancreatitis as listed possible adverse effects - lower suspicion for this though). Triglycerides pending CT with mild acute pancreatitis, lipase not significantly high (she presented about 1 week after sx first start, later in course or some chronic pancreatitis?) Clears for now Dilaudid  prn, APAP prn   Hx ETOH Abuse Hx Etoh Withdrawal Seizure She denies recent use - unclear if she's Karington Zarazua reliable historian, her last drink she reports was when her friend died 08-10-25had glass Rhea Thrun wine then.  She's tried to cut it out.  CIWA  Anxiety  Depression  Insomnia Takes xanax  prn  Lunesta for sleep  Pharmacy substitute for pristiq   Hypomagnesemia  Replace, follow   Mildly Elevated  AST Noted  Elevated D dimer CT PE protocol without PE   HTN Amlodipine   Dyslipidemia Lipitor Fibrate  Chronic Pain Recently started on gabapentin    Obesity Body mass index is 36.02 kg/m.    DVT prophylaxis: lovenox   Code Status:   full  Family Communication:  none  Disposition Plan:   Patient is from:  home  Anticipated  DC to:  home  Anticipated DC date:  Pending improvement - 1-2 days  Anticipated DC barriers: Improvement in abdominal pain  Consults called:  none  Admission status:  inpatient   Severity of Illness: The appropriate patient status for this patient is INPATIENT. Inpatient status is judged to be reasonable and necessary in order to provide the required intensity of service to ensure the patient's safety. The patient's presenting symptoms, physical exam findings, and initial radiographic and laboratory data in the context of their chronic comorbidities is felt to place them at high risk for further clinical deterioration. Furthermore, it is not anticipated that the patient will be medically stable for discharge from the hospital within 2 midnights of admission.   * I certify that at the point of admission it is my clinical judgment that the patient will require inpatient hospital care spanning beyond 2 midnights from the point of admission due to high intensity of service, high risk for further deterioration and high frequency of surveillance required.DEWAINE Meliton Monte MD [facility]  How to contact the [facility] Attending or Consulting provider 7A - 7P or covering provider during after hours 7P -7A, for this patient?   Check the care team in [facility] and look for Cortney Mckinney) attending/consulting [facility] provider listed and b) the [facility] team listed Log into [email or web address] and use [place]'s universal password to access. If you do not have the password, please contact the hospital operator. Locate the [facility] provider you are looking for under [facility] and page to Carlise Stofer  number that you can be directly reached. If you still have difficulty reaching the provider, please page the [facility] (Director on Call) for the Hospitalists listed on amion for assistance.  05/11/2024, 10:49 AM

## 2024-05-11 NOTE — Progress Notes (Signed)
  Carryover admission to the Day Admitter.  I discussed this case with the EDP, Ubaldo High, PA.  Per these discussions:   This is a 63 year old female with history of previous hospitalizations for acute alcoholic pancreatitis, who is being admitted this evening for acute pancreatitis, after presenting with 1 week of persistent abdominal discomfort, with presenting lipase found to be elevated, while CTA abdomen/pelvis showed evidence of mild acute pancreatitis.  The patient reports that she intermittently continues to consume alcohol.  Initial labs also reflected a mildly elevated D-dimer, which prompted CTA chest, which showed no evidence of acute pulmonary embolism.   I have placed an order for inpatient admission to med/tele for further evaluation and management of the above.  I have placed some additional preliminary admit orders via the adult multi-morbid admission order set. I have also ordered n.p.o., prn IV Zofran , as needed IV Dilaudid , incentive spirometry, lactated Ringer 's at 125 cc/h x 12 hours.  I have ordered morning labs that include CMP, CBC, magnesium  level.    Eva Pore, DO Hospitalist

## 2024-05-11 NOTE — Group Note (Deleted)
 Date:  05/11/2024 Time:  2:22 PM  Group Topic/Focus:  Wellness Toolbox:   The focus of this group is to discuss various aspects of wellness, balancing those aspects and exploring ways to increase the ability to experience wellness.  Patients will create a wellness toolbox for use upon discharge.     Participation Level:  {[facility] PARTICIPATION OZCZO:77735}  Participation Quality:  {B[facility] PARTICIPATION QUALITY:22265}  Affect:  {B[facility] AFFECT:22266}  Cognitive:  {B[facility] COGNITIVE:22267}  Insight: {BHH Insight2:20797}  Engagement in Group:  {B[facility] ENGAGEMENT IN HMNLE:77731}  Modes of Intervention:  {BHH MODES OF INTERVENTION:22269}  Additional Comments:  ***  Myra Curtistine BROCKS 05/11/2024, 2:22 PM

## 2024-05-11 NOTE — ED Provider Notes (Signed)
  Physical Exam  BP (!) 167/95   Pulse 94   Temp 98.8 F (37.1 C) (Oral)   Resp 17   Ht 5' 7 (1.702 m)   Wt 104.3 kg   LMP 06/30/2011   SpO2 97%   BMI 36.02 kg/m   Physical Exam  Procedures  Procedures  ED Course / MDM    Medical Decision Making Amount and/or Complexity of Data Reviewed Labs: ordered. Radiology: ordered.  Risk Prescription drug management.   Patient care assumed at shift handoff from [facility], PA-C.  See her note for full details.  In short, 63 year old female patient with history of abdominal pain and chest discomfort.  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  She states she occasionally drinks alcohol and has been admitted previously for similar pain.  Pain has been ongoing for 1 week.  The patient has attempted a liquid diet with no relief at home.  At time of shift handoff plan to follow-up on results of CT imaging including CT angio chest PE study and CT abdomen pelvis with contrast with plans for likely admission.  Imaging significant for acute pancreatitis.  No PE on imaging.  No evidence of pulmonary emboli.    Moderate-sized hiatal hernia.    Bibasilar atelectatic changes.    Findings consistent with prior granulomatous disease.   I have requested consultation with the hospitalist, Dr. Marcene, who agrees to see the patient for admission for further management of acute pancreatitis     Rhonda Espinoza 05/11/24 0136    Rhonda Lonni PARAS, MD 05/12/24 [phone]

## 2024-05-12 DIAGNOSIS — K858 Other acute pancreatitis without necrosis or infection: Secondary | ICD-10-CM | POA: Diagnosis not present

## 2024-05-12 LAB — COMPREHENSIVE METABOLIC PANEL WITH GFR
ALT: 54 U/L — ABNORMAL HIGH (ref 0–44)
AST: 83 U/L — ABNORMAL HIGH (ref 15–41)
Albumin: 3.3 g/dL — ABNORMAL LOW (ref 3.5–5.0)
Alkaline Phosphatase: 83 U/L (ref 38–126)
Anion gap: 11 (ref 5–15)
BUN: 5 mg/dL — ABNORMAL LOW (ref 8–23)
CO2: 21 mmol/L — ABNORMAL LOW (ref 22–32)
Calcium: 8.9 mg/dL (ref 8.9–10.3)
Chloride: 107 mmol/L (ref 98–111)
Creatinine, Ser: 0.78 mg/dL (ref 0.44–1.00)
GFR, Estimated: >60 mL/min (ref >60)
Glucose, Bld: 104 mg/dL — ABNORMAL HIGH (ref 70–99)
Potassium: 3.7 mmol/L (ref 3.5–5.1)
Sodium: 139 mmol/L (ref 135–145)
Total Bilirubin: 1 mg/dL (ref 0.0–1.2)
Total Protein: 6.4 g/dL — ABNORMAL LOW (ref 6.5–8.1)

## 2024-05-12 LAB — CBC
HCT: 34.5 % — ABNORMAL LOW (ref 36.0–46.0)
Hemoglobin: 11.4 g/dL — ABNORMAL LOW (ref 12.0–15.0)
MCH: 31.1 pg (ref 26.0–34.0)
MCHC: 33 g/dL (ref 30.0–36.0)
MCV: 94.3 fL (ref 80.0–100.0)
Platelets: 328 K/uL (ref 150–400)
RBC: 3.66 MIL/uL — ABNORMAL LOW (ref 3.87–5.11)
RDW: 15.9 % — ABNORMAL HIGH (ref 11.5–15.5)
WBC: 5.8 K/uL (ref 4.0–10.5)
nRBC: 0 % (ref 0.0–0.2)

## 2024-05-12 LAB — PROTIME-INR
INR: 0.9 (ref 0.8–1.2)
Prothrombin Time: 12.9 s (ref 11.4–15.2)

## 2024-05-12 MED ORDER — LACTATED RINGERS IV SOLN: INTRAVENOUS | Status: AC

## 2024-05-12 MED ORDER — ACETAMINOPHEN 650 MG RE SUPP: 650.0000 mg | Freq: Four times a day (QID) | RECTAL | Status: DC | PRN

## 2024-05-12 MED ORDER — OXYCODONE HCL 5 MG PO TABS: 5.0000 mg | ORAL_TABLET | ORAL | Status: DC | PRN

## 2024-05-12 MED ORDER — ACETAMINOPHEN 325 MG PO TABS
650.0000 mg | ORAL_TABLET | Freq: Four times a day (QID) | ORAL | Status: DC | PRN
  Administered 2024-05-12 – 2024-05-13 (×2): 650 mg via ORAL
  Filled 2024-05-12 (×2): qty 2

## 2024-05-12 NOTE — Progress Notes (Signed)
 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 Hyperlipidemia Depression with anxiety--insomnia Class II obesity BMI 38 Had cholecystotomy in the past  Readmitted with abdominal pain difficulty eating progressive cramping back pain 139 potassium 3.7 bicarb 21 BUN/creatinine 8/0.8 LFTs AST/ALT 59/33 bilirubin 0.8 Triglyceride 198 WBC 6.4 hemoglobin 11.5 platelet 357-no INR performed Troponin was 3 D-dimer was 0.7 CT ABD changes of mild acute pancreatitis-fatty infiltration of liver noted no comment on prior tail of pancreas cyst CT angio chest no evidence of pulmonary embolism moderate hiatal hernia with possible prior granulomatous disease?  Outside MRI 7/25 = 1.  Ill-defined fat stranding around the pancreatic head suggesting mild inflammation. Prominence of the main pancreatic duct, no obstructing etiology.  2.  Stable complicated pancreatic tail cysts, follow-up can be obtained in one year to continue to document stability.  3.  Hepatic steatosis  4.  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  Prior cyst of tail pancreas--tells me he had an MRI at [facility] valsartan  and HCTZ discontinued prior--I am also stopping her Zetia Will continue fluids 50 cc/H for another 24 hours--can have clear liquid diet graduated if she feels better--- start Oxy IR 5 every 4 as needed and will attempt to wean off off Dilaudid  IV  Prior heavy EtOH with alcohol seizures in the past Withdrawal scores ranging below 8 currently between 2 and  5 Continues on the protocol with Ativan   Depression anxiety insomnia Continue Effexor  150 daily can continue Xanax  1 mg 3 times daily as needed as we taper the CIWA protocol  Class II obesity BMI 38 HTN Continue amlodipine  10, added on Toprol -XL 25 atorvastatin  40 Mg  fenofibrate  160 held for now,      Data Reviewed:   Sodium 139 potassium 3.7 bicarb 21 BUN/creatinine 5/0.7 AST/ALT 83/54-total protein 6.4 bili 1.0 WBC 5.8 hemoglobin 11.4 platelet 328  DVT prophylaxis: SCD  Status is: Inpatient Remains inpatient appropriate because: Requires treatment     Subjective: Had a shaking episode overnight on awakening in the middle of the night where she felt shaky and felt like she was almost having a seizure but she was completely coherent-she is still quite shaky--- she has not had a seizure for over 10 years based on what she tells me She thinks she can eat she is tolerating fluids she has only mild abdominal pain    Objective + exam Vitals:   05/11/24 2246 05/12/24 0148 05/12/24 0500 05/12/24 0642  BP: (!) 183/93 (!) 161/77  (!) 162/89  Pulse: 99   100  Resp:  13  18  Temp: 98.7 F (37.1 C) 98.3 F (36.8 C)  98.3 F (36.8 C)  TempSrc: Oral Oral  Oral  SpO2: 90%   95%  Weight:   110.4 kg   Height:       Filed Weights   05/10/24 1029 05/12/24 0500  Weight: 104.3 kg 110.4 kg     Examination: EOMI NCAT no focal deficit no icterus no pallor thick neck Mallampati 4 S1-S2 no  murmur Chest clear Abdomen soft slight tenderness in epigastrium and under right lower quadrant ROM intact     Scheduled Meds:  amLODipine   10 mg Oral Daily   aspirin  EC  81 mg Oral q AM   atorvastatin   40 mg Oral Daily   enoxaparin  (LOVENOX ) injection  50 mg Subcutaneous Q24H   fenofibrate   160 mg Oral Daily   folic acid   1 mg Oral Daily   gabapentin   300 mg Oral Daily   LORazepam   0-4 mg Oral Q6H   Followed by   NOREEN ON 05/13/2024] LORazepam   0-4 mg Oral Q12H   metoprolol   succinate  25 mg Oral Daily   multivitamin with minerals  1 tablet Oral Daily   pantoprazole   40 mg Oral BID   thiamine   100 mg Oral Daily   Or   thiamine   100 mg Intravenous Daily   venlafaxine  XR  150 mg Oral Q breakfast   Continuous Infusions:  lactated ringers  125 mL/hr at 05/12/24 0254    Time 45  Jai-Gurmukh Andalyn Heckstall, MD  [facility]

## 2024-05-12 NOTE — Plan of Care (Signed)
  Problem: Education: Goal: Knowledge of General Education information will improve Description: Including pain rating scale, medication(s)/side effects and non-pharmacologic comfort measures 05/12/2024 0703 by Jama Louvenia RAMAN, RN Outcome: Progressing 05/12/2024 0703 by Jama Louvenia RAMAN, RN Outcome: Progressing   Problem: Health Behavior/Discharge Planning: Goal: Ability to manage health-related needs will improve 05/12/2024 0703 by Jama Louvenia RAMAN, RN Outcome: Progressing 05/12/2024 0703 by Jama Louvenia RAMAN, RN Outcome: Progressing   Problem: Clinical Measurements: Goal: Ability to maintain clinical measurements within normal limits will improve 05/12/2024 0703 by Jama Louvenia RAMAN, RN Outcome: Progressing 05/12/2024 0703 by Jama Louvenia RAMAN, RN Outcome: Progressing Goal: Will remain free from infection 05/12/2024 0703 by Jama Louvenia RAMAN, RN Outcome: Progressing 05/12/2024 0703 by Jama Louvenia RAMAN, RN Outcome: Progressing Goal: Diagnostic test results will improve 05/12/2024 0703 by Jama Louvenia RAMAN, RN Outcome: Progressing 05/12/2024 0703 by Jama Louvenia RAMAN, RN Outcome: Progressing Goal: Respiratory complications will improve 05/12/2024 0703 by Jama Louvenia RAMAN, RN Outcome: Progressing 05/12/2024 0703 by Jama Louvenia RAMAN, RN Outcome: Progressing Goal: Cardiovascular complication will be avoided 05/12/2024 0703 by Jama Louvenia RAMAN, RN Outcome: Progressing 05/12/2024 0703 by Jama Louvenia RAMAN, RN Outcome: Progressing   Problem: Activity: Goal: Risk for activity intolerance will decrease 05/12/2024 0703 by Jama Louvenia RAMAN, RN Outcome: Progressing 05/12/2024 0703 by Jama Louvenia RAMAN, RN Outcome: Progressing   Problem: Nutrition: Goal: Adequate nutrition will be maintained 05/12/2024 0703 by Jama Louvenia RAMAN, RN Outcome: Progressing 05/12/2024 0703 by Jama Louvenia RAMAN, RN Outcome: Progressing   Problem: Coping: Goal: Level of anxiety will decrease 05/12/2024 0703 by Jama Louvenia RAMAN, RN Outcome:  Progressing 05/12/2024 0703 by Jama Louvenia RAMAN, RN Outcome: Progressing   Problem: Elimination: Goal: Will not experience complications related to bowel motility 05/12/2024 0703 by Jama Louvenia RAMAN, RN Outcome: Progressing 05/12/2024 0703 by Jama Louvenia RAMAN, RN Outcome: Progressing Goal: Will not experience complications related to urinary retention 05/12/2024 0703 by Jama Louvenia RAMAN, RN Outcome: Progressing 05/12/2024 0703 by Jama Louvenia RAMAN, RN Outcome: Progressing   Problem: Pain Managment: Goal: General experience of comfort will improve and/or be controlled 05/12/2024 0703 by Jama Louvenia RAMAN, RN Outcome: Progressing 05/12/2024 0703 by Jama Louvenia RAMAN, RN Outcome: Progressing   Problem: Safety: Goal: Ability to remain free from injury will improve 05/12/2024 0703 by Jama Louvenia RAMAN, RN Outcome: Progressing 05/12/2024 0703 by Jama Louvenia RAMAN, RN Outcome: Progressing   Problem: Skin Integrity: Goal: Risk for impaired skin integrity will decrease 05/12/2024 0703 by Jama Louvenia RAMAN, RN Outcome: Progressing 05/12/2024 0703 by Jama Louvenia RAMAN, RN Outcome: Progressing

## 2024-05-12 NOTE — Progress Notes (Addendum)
 Acute pancreatitis. Hx of alcoholic pancreatitis, withdrawal seizures, anxiety, depression, hypertension.    05/12/24 1502  TOC Brief Assessment  Insurance and Status Reviewed  Patient has primary care physician Yes  Home environment has been reviewed From home with husband.  Prior level of function: PTA independent with ADL's , DME usage  Prior/Current Home Services No current home services  Social Drivers of Health Review SDOH reviewed no interventions necessary  Readmission risk has been reviewed No  Transition of care needs no transition of care needs at this time   Pt without RX  med concerns or transportation issues.                                          TOC following and will assist with needs as they present. Jon Hoit [facility] [phone]

## 2024-05-12 NOTE — Plan of Care (Signed)

## 2024-05-13 DIAGNOSIS — K858 Other acute pancreatitis without necrosis or infection: Secondary | ICD-10-CM | POA: Diagnosis not present

## 2024-05-13 LAB — CBC
HCT: 35.3 % — ABNORMAL LOW (ref 36.0–46.0)
Hemoglobin: 11.5 g/dL — ABNORMAL LOW (ref 12.0–15.0)
MCH: 30.5 pg (ref 26.0–34.0)
MCHC: 32.6 g/dL (ref 30.0–36.0)
MCV: 93.6 fL (ref 80.0–100.0)
Platelets: 319 K/uL (ref 150–400)
RBC: 3.77 MIL/uL — ABNORMAL LOW (ref 3.87–5.11)
RDW: 15.6 % — ABNORMAL HIGH (ref 11.5–15.5)
WBC: 5.7 K/uL (ref 4.0–10.5)
nRBC: 0 % (ref 0.0–0.2)

## 2024-05-13 LAB — COMPREHENSIVE METABOLIC PANEL WITH GFR
ALT: 39 U/L (ref 0–44)
AST: 37 U/L (ref 15–41)
Albumin: 3.2 g/dL — ABNORMAL LOW (ref 3.5–5.0)
Alkaline Phosphatase: 72 U/L (ref 38–126)
Anion gap: 11 (ref 5–15)
BUN: 6 mg/dL — ABNORMAL LOW (ref 8–23)
CO2: 22 mmol/L (ref 22–32)
Calcium: 9.1 mg/dL (ref 8.9–10.3)
Chloride: 106 mmol/L (ref 98–111)
Creatinine, Ser: 0.76 mg/dL (ref 0.44–1.00)
GFR, Estimated: >60 mL/min (ref >60)
Glucose, Bld: 118 mg/dL — ABNORMAL HIGH (ref 70–99)
Potassium: 3.5 mmol/L (ref 3.5–5.1)
Sodium: 139 mmol/L (ref 135–145)
Total Bilirubin: 0.7 mg/dL (ref 0.0–1.2)
Total Protein: 6.5 g/dL (ref 6.5–8.1)

## 2024-05-13 LAB — URINE CULTURE: Culture: 50000 — AB

## 2024-05-13 MED ORDER — ACETAMINOPHEN 500 MG PO TABS: 500.0000 mg | ORAL_TABLET | ORAL | Status: AC | PRN

## 2024-05-13 MED ORDER — HYDRALAZINE HCL 20 MG/ML IJ SOLN: 5.0000 mg | Freq: Four times a day (QID) | INTRAMUSCULAR | Status: DC | PRN

## 2024-05-13 NOTE — Discharge Summary (Signed)
 Physician Discharge Summary  Rhonda Espinoza FMW:[id number] DOB: 05/22/1961 DOA: 05/10/2024  PCP: Samie Frederick, PA-C  Admit date: 05/10/2024 Discharge date: 05/13/2024  Time spent: 36 minutes  Recommendations for Outpatient Follow-up:  Needs Chem-12 CBC INR 1 week Needs outpatient follow-up with [facility] gastroenterologist for further delineation planning regarding her chronic pancreatitis Recommend soft diet until the next week  Discharge Diagnoses:  MAIN problem for hospitalization   Acute pancreatitis  Please see below for itemized issues addressed in HOpsital- refer to other progress notes for clarity if needed  Discharge Condition: Improved  Diet recommendation: Soft diet low-fat etc. 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 Hyperlipidemia Depression with anxiety--insomnia Class II obesity BMI 38 Had cholecystotomy in the past   Readmitted with abdominal pain difficulty eating progressive cramping back pain 139 potassium 3.7 bicarb 21 BUN/creatinine 8/0.8 LFTs AST/ALT 59/33 bilirubin 0.8 Triglyceride 198 WBC 6.4 hemoglobin 11.5 platelet 357-no INR performed Troponin was 3 D-dimer was 0.7 CT ABD changes of mild acute pancreatitis-fatty infiltration of liver noted no comment on prior tail of pancreas cyst CT angio chest no evidence of pulmonary embolism moderate hiatal hernia with possible prior granulomatous disease?   Outside MRI 7/25 = 1.  Ill-defined fat stranding around the pancreatic head suggesting mild inflammation. Prominence of the main pancreatic duct, no obstructing etiology.  2.  Stable complicated pancreatic tail cysts, follow-up can be obtained in one year to continue to document stability.  3.   Hepatic steatosis  4.  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  Prior cyst of tail pancreas--tells me he had an MRI at [facility] MRI result as above which I reviewed with Dr. Anselm Bring of gastroenterology and he felt that cyst was tiny and that would not need any workup inpatient and is unlikely to cause things valsartan  and HCTZ discontinued per patient at prior-hospitalizations for pancreatitis-I have temporarily stopped her Zetia and statin for now and she can resume She resolved quickly graduated to a regular diet tolerating it well on 8/15 and was stabilized for discharge and did not require extra pain meds  Prior heavy EtOH with alcohol seizures in the past Withdrawal scores ranging below 8 currently between 2 and 7 with main thing being tremor She feels stable currently drinks only occasionally and I have encouraged her to stop drinking completely going forward as this is deleterious to herself   Depression anxiety insomnia Continue Effexor  150 daily can continue Xanax  1 mg 3 times daily   Class II obesity BMI 38 HTN Continue amlodipine  10, added on Toprol -XL 25 for better control atorvastatin  40 Mg  fenofibrate  160 held for now as above   Discharge Exam: Vitals:   05/13/24 0740 05/13/24 1157  BP: (!) 157/79 (!) 158/87  Pulse: 80 84  Resp: 17   Temp: (!) 97.4 F (36.3 C)   SpO2: 93%     Subj on day of d/c   Awake coherent no distress looks well feels fair  General Exam on discharge  EOMI NCAT thick neck Mallampati 4 no icterus neck soft supple Chest is clear Abdomen soft no rebound no guarding ROM is intact  Discharge Instructions   Discharge Instructions     Diet - low sodium heart healthy   Complete by: As directed    Discharge instructions   Complete by: As directed    Continue all other medications without change except maybe cut  back the dose of your Tylenol  a little bit and pause your atorvastatin  as well as your fenofibrate  until next week sometime Please try not to use the Allegra unless you really need it you can continue with your other medications and get labs as an outpatient within about a week or so Follow-up with your [facility] gastroenterologist for further details of what you need to do but I really think that the mainstay of treatment would just be to recognize when you are having abdominal issues and come to the hospital as we discussed once we have pancreatitis you are probably more prone to getting it and we did not find any real specific 1 cause causing this   Increase activity slowly   Complete by: As directed       Allergies as of 05/13/2024       Reactions   Latex Other (See Comments)   moreso like bandages- these cause blisters   Wound Dressings Other (See Comments)   moreso like bandages- these cause blisters   Valsartan -hydrochlorothiazide  Other (See Comments)   Caused pancreatitis         Medication List     PAUSE taking these medications    atorvastatin  40 MG tablet Wait to take this until: May 18, 2024 Commonly known as: LIPITOR Take 40 mg by mouth daily.   fenofibrate  160 MG tablet Wait to take this until: May 18, 2024 Take 160 mg by mouth daily.       STOP taking these medications    fexofenadine 180 MG tablet Commonly known as: ALLEGRA       TAKE these medications    acetaminophen  500 MG tablet Commonly known as: TYLENOL  Take 1 tablet (500 mg total) by mouth as needed for mild pain (pain score 1-3) or headache. What changed: how much to take   ALPRAZolam  1 MG tablet Commonly known as: XANAX  Take 1 mg by mouth 3 (three) times daily as needed for sleep or anxiety.   amLODipine  10 MG tablet Commonly known as: NORVASC  Take 1 tablet by mouth daily.   aspirin  EC 81 MG tablet Take 81 mg by mouth in the morning. Swallow whole.   Cholecalciferol 1.25  MG (50000 UT) capsule Take 50,000 Units by mouth once a week. Mondays   desvenlafaxine  100 MG 24 hr tablet Commonly known as: PRISTIQ  Take 100 mg by mouth at bedtime.   dicyclomine  20 MG tablet Commonly known as: BENTYL  Take 1 tablet (20 mg total) by mouth 2 (two) times daily. What changed:  when to take this reasons to take this   Eszopiclone 3 MG Tabs Take 3 mg by mouth at bedtime as needed.   gabapentin  300 MG capsule Commonly known as: NEURONTIN  Take 300 mg by mouth daily.   HYDROcodone -acetaminophen  5-325 MG tablet Commonly known as: NORCO/VICODIN Take 1 tablet by mouth every 6 (six) hours as needed for severe pain (pain score 7-10).   hyoscyamine 0.375 MG 12 hr tablet Commonly known as: LEVBID Take 0.375 mg by mouth every 12 (twelve) hours as needed for cramping.   metoprolol  succinate 25 MG 24 hr tablet Commonly known as: TOPROL -XL Take 25 mg by mouth daily.   multivitamin with minerals tablet Take 1 tablet by  mouth daily.   pantoprazole  40 MG tablet Commonly known as: PROTONIX  Take 40 mg by mouth 2 (two) times daily.   promethazine  25 MG tablet Commonly known as: PHENERGAN  Take 1 tablet (25 mg total) by mouth every 6 (six) hours as needed for nausea or vomiting.   Teriparatide 620 MCG/2.48ML Sopn INJECT 20 MCG UNDER THE SKIN 1 TIME A DAY. DISCARD PEN 28 DAYS AFTER INITIAL USE   thiamine  100 MG tablet Commonly known as: VITAMIN B1 Take 1 tablet (100 mg total) by mouth daily.   triamcinolone  cream 0.1 % Commonly known as: KENALOG  Apply 1 Application topically 2 (two) times daily.       Allergies  Allergen Reactions   Latex Other (See Comments)    moreso like bandages- these cause blisters   Wound Dressings Other (See Comments)    moreso like bandages- these cause blisters   Valsartan -Hydrochlorothiazide  Other (See Comments)    Caused pancreatitis     Follow-up Information     Samie Frederick, PA-C Follow up.   Specialty: Physician  Assistant Contact information: [address] [place] [address] [phone]                  The results of significant diagnostics from this hospitalization (including imaging, microbiology, ancillary and laboratory) are listed below for reference.    Significant Diagnostic Studies: CT ABDOMEN PELVIS W CONTRAST Result Date: 05/11/2024 CLINICAL DATA:  Abdominal pain, history of pancreatitis EXAM: CT ABDOMEN AND PELVIS WITH CONTRAST TECHNIQUE: Multidetector CT imaging of the abdomen and pelvis was performed using the standard protocol following bolus administration of intravenous contrast. RADIATION DOSE REDUCTION: This exam was performed according to the departmental dose-optimization program which includes automated exposure control, adjustment of the mA and/or kV according to patient size and/or use of iterative reconstruction technique. CONTRAST:  OMNIPAQUE  IOHEXOL  350 MG/ML SOLN COMPARISON:  None Available. FINDINGS: Lower chest: Basilar atelectasis is noted. Hepatobiliary: Fatty infiltration of the liver is noted. The gallbladder has been surgically removed. Pancreas: Pancreas is well visualized without evidence of necrosis. Very mild peripancreatic inflammatory changes seen consistent with acute pancreatitis. Spleen: Normal in size without focal abnormality. Adrenals/Urinary Tract: Adrenal glands are within normal limits. Kidneys are well visualized bilaterally. No renal calculi or obstructive changes are seen. Simple renal cyst is noted within the right kidney. No follow-up is recommended. The bladder is within normal limits. Stomach/Bowel: The appendix is unremarkable. No obstructive or inflammatory changes of the colon are seen. Small bowel is unremarkable. Stomach again shows hiatal hernia. Vascular/Lymphatic: Aortic atherosclerosis. No enlarged abdominal or pelvic lymph nodes. Reproductive: Uterus and bilateral adnexa are unremarkable. Other: No abdominal wall hernia or  abnormality. No abdominopelvic ascites. Musculoskeletal: No acute or significant osseous findings. Prior vertebral augmentation is noted at T12, L1 and L4. IMPRESSION: Changes of mild acute pancreatitis. Electronically Signed   By: Oneil Devonshire M.D.   On: 05/11/2024 00:56   CT Angio Chest PE W and/or Wo Contrast Result Date: 05/11/2024 CLINICAL DATA:  Chest pain and positive D-dimer EXAM: CT ANGIOGRAPHY CHEST WITH CONTRAST TECHNIQUE: Multidetector CT imaging of the chest was performed using the standard protocol during bolus administration of intravenous contrast. Multiplanar CT image reconstructions and MIPs were obtained to evaluate the vascular anatomy. RADIATION DOSE REDUCTION: This exam was performed according to the departmental dose-optimization program which includes automated exposure control, adjustment of the mA and/or kV according to patient size and/or use of iterative reconstruction technique. CONTRAST:  OMNIPAQUE  IOHEXOL  350  MG/ML SOLN COMPARISON:  None Available. FINDINGS: Cardiovascular: Thoracic aorta shows a normal branching pattern. No aneurysmal dilatation or dissection is noted. Heart is mildly enlarged in size. The pulmonary artery shows a normal branching pattern bilaterally. No intraluminal filling defect to suggest pulmonary embolism is noted. Mediastinum/Nodes: Thoracic inlet is within normal limits. No hilar or mediastinal adenopathy is noted. The esophagus is within normal limits. Moderate-sized hiatal hernia is noted. Lungs/Pleura: Lungs are well aerated bilaterally. Mild bibasilar atelectatic changes are seen. No effusion is identified. No sizable parenchymal nodule is seen. Scattered calcified granulomas are noted. Upper Abdomen: Moderate size sliding-type hiatal hernia. Fatty infiltration of the liver is noted. Musculoskeletal: Degenerative changes of the thoracic spine are noted. No rib abnormality is seen. Changes of prior vertebral augmentation at T12 is noted. Chronic  T11 fracture is seen. Review of the MIP images confirms the above findings. IMPRESSION: No evidence of pulmonary emboli. Moderate-sized hiatal hernia. Bibasilar atelectatic changes. Findings consistent with prior granulomatous disease. Electronically Signed   By: Oneil Devonshire M.D.   On: 05/11/2024 00:53   DG Abdomen Acute W/Chest Result Date: 05/10/2024 CLINICAL DATA:  Chest pain.  Abdominal pain. EXAM: DG ABDOMEN ACUTE WITH 1 VIEW CHEST COMPARISON:  Chest radiograph dated 12/20/2023. FINDINGS: There is no evidence of dilated bowel loops or free intraperitoneal air. No radiopaque calculi are seen. Cholecystectomy clips in the right upper quadrant. Linear scarring/atelectasis in the right midlung zone. No focal consolidation, pleural effusion, or pneumothorax. Heart size and mediastinal contours are within normal limits. Prior vertebral augmentation again noted at T12, L1, and L4. Dextrocurvature of the mid lumbar spine. IMPRESSION: 1. Negative abdominal radiographs. 2. Linear scarring/atelectasis in the right mid lung zone. Electronically Signed   By: Harrietta Sherry M.D.   On: 05/10/2024 17:14    Microbiology: Recent Results (from the past 240 hours)  Urine Culture     Status: Abnormal   Collection Time: 05/10/24 10:24 PM   Specimen: Urine, Clean Catch  Result Value Ref Range Status   Specimen Description URINE, CLEAN CATCH  Final   Special Requests NONE  Final   Culture (A)  Final    50,000 COLONIES/mL STAPHYLOCOCCUS EPIDERMIDIS 60,000 COLONIES/mL LACTOBACILLUS SPECIES Standardized susceptibility testing for this organism is not available. Performed at [facility], [address], [place], [place] [address]    Report Status 05/13/2024 FINAL  Final   Organism ID, Bacteria STAPHYLOCOCCUS EPIDERMIDIS (A)  Final      Susceptibility   Staphylococcus epidermidis - MIC*    CIPROFLOXACIN <=0.5 SENSITIVE Sensitive     GENTAMICIN <=0.5 SENSITIVE Sensitive     NITROFURANTOIN <=16 SENSITIVE  Sensitive     OXACILLIN >=4 RESISTANT Resistant     TETRACYCLINE <=1 SENSITIVE Sensitive     VANCOMYCIN 2 SENSITIVE Sensitive     TRIMETH/SULFA 20 SENSITIVE Sensitive     RIFAMPIN <=0.5 SENSITIVE Sensitive     Inducible Clindamycin NEGATIVE Sensitive     * 50,000 COLONIES/mL STAPHYLOCOCCUS EPIDERMIDIS  Resp panel by RT-PCR (RSV, Flu A&B, Covid) Anterior Nasal Swab     Status: None   Collection Time: 05/10/24 11:29 PM   Specimen: Anterior Nasal Swab  Result Value Ref Range Status   SARS Coronavirus 2 by RT PCR NEGATIVE NEGATIVE Final   Influenza A by PCR NEGATIVE NEGATIVE Final   Influenza B by PCR NEGATIVE NEGATIVE Final    Comment: (NOTE) The Xpert Xpress SARS-CoV-2/FLU/RSV plus assay is intended as an aid in the diagnosis of influenza from  Nasopharyngeal swab specimens and should not be used as a sole basis for treatment. Nasal washings and aspirates are unacceptable for Xpert Xpress SARS-CoV-2/FLU/RSV testing.  Fact Sheet for Patients: BloggerCourse.com  Fact Sheet for Healthcare Providers: SeriousBroker.it  This test is not yet approved or cleared by the [place]  FDA and has been authorized for detection and/or diagnosis of SARS-CoV-2 by FDA under an Emergency Use Authorization (EUA). This EUA will remain in effect (meaning this test can be used) for the duration of the COVID-19 declaration under Section 564(b)(1) of the Act, 21 U.S.C. section 360bbb-3(b)(1), unless the authorization is terminated or revoked.     Resp Syncytial Virus by PCR NEGATIVE NEGATIVE Final    Comment: (NOTE) Fact Sheet for Patients: BloggerCourse.com  Fact Sheet for Healthcare Providers: SeriousBroker.it  This test is not yet approved or cleared by the [place]  FDA and has been authorized for detection and/or diagnosis of SARS-CoV-2 by FDA under an Emergency Use Authorization (EUA).  This EUA will remain in effect (meaning this test can be used) for the duration of the COVID-19 declaration under Section 564(b)(1) of the Act, 21 U.S.C. section 360bbb-3(b)(1), unless the authorization is terminated or revoked.  Performed at [facility], [address] [place], [place] [address]      Labs: Basic Metabolic Panel: Recent Labs  Lab 05/10/24 1034 05/10/24 1715 05/11/24 0400 05/11/24 1801 05/12/24 0555 05/13/24 0537  NA 137  --  139  --  139 139  K 4.1  --  3.7  --  3.7 3.5  CL 102  --  108  --  107 106  CO2 20*  --  21*  --  21* 22  GLUCOSE 143*  --  138*  --  104* 118*  BUN 10  --  8  --  5* 6*  CREATININE 0.92  --  0.84 0.78 0.78 0.76  CALCIUM  9.7  --  8.7*  --  8.9 9.1  MG  --  1.7 1.5*  --   --   --    Liver Function Tests: Recent Labs  Lab 05/10/24 1034 05/11/24 0400 05/12/24 0555 05/13/24 0537  AST 31 59* 83* 37  ALT 25 33 54* 39  ALKPHOS 68 66 83 72  BILITOT 0.7 0.8 1.0 0.7  PROT 7.1 7.0 6.4* 6.5  ALBUMIN 4.2 3.7 3.3* 3.2*   Recent Labs  Lab 05/10/24 1715  LIPASE 166*   No results for input(s): AMMONIA in the last 168 hours. CBC: Recent Labs  Lab 05/10/24 1034 05/11/24 0400 05/11/24 1801 05/12/24 0555 05/13/24 0537  WBC 7.5 6.4 6.8 5.8 5.7  NEUTROABS  --  4.7  --   --   --   HGB 12.9 11.5* 12.0 11.4* 11.5*  HCT 40.2 34.8* 36.1 34.5* 35.3*  MCV 96.9 94.3 94.5 94.3 93.6  PLT 435* 357 360 328 319   Cardiac Enzymes: No results for input(s): CKTOTAL, CKMB, CKMBINDEX, TROPONINI in the last 168 hours. BNP: BNP (last 3 results) No results for input(s): BNP in the last 8760 hours.  ProBNP (last 3 results) No results for input(s): PROBNP in the last 8760 hours.  CBG: No results for input(s): GLUCAP in the last 168 hours.  Signed:  Jai-Gurmukh Ashlin Hidalgo MD   [facility] 05/13/2024, 1:05 PM

## 2024-05-13 NOTE — Plan of Care (Signed)
  Problem: Clinical Measurements: Goal: Ability to maintain clinical measurements within normal limits will improve Outcome: Progressing Goal: Will remain free from infection Outcome: Progressing Goal: Diagnostic test results will improve Outcome: Progressing Goal: Respiratory complications will improve Outcome: Progressing Goal: Cardiovascular complication will be avoided Outcome: Progressing   Problem: Activity: Goal: Risk for activity intolerance will decrease Outcome: Progressing   Problem: Nutrition: Goal: Adequate nutrition will be maintained Outcome: Progressing   Problem: Coping: Goal: Level of anxiety will decrease Outcome: Progressing   Problem: Elimination: Goal: Will not experience complications related to bowel motility Outcome: Progressing Goal: Will not experience complications related to urinary retention Outcome: Progressing   Problem: Pain Managment: Goal: General experience of comfort will improve and/or be controlled Outcome: Progressing

## 2024-05-13 NOTE — Progress Notes (Signed)
 Reviewed AVS, patient expressed understanding of medications, MD follow up reviewed.   Removed IV, Site clean, dry and intact.  See LDA for information on wounds at discharge. Patient states all belongings brought to the hospital at time of admission are accounted for and packed to take home.   [facility] contacted to transport patient to entrance A, where family member was waiting in vehicle to transport home.

## 2024-06-20 ENCOUNTER — Emergency Department

## 2024-06-20 ENCOUNTER — Other Ambulatory Visit: Payer: Self-pay

## 2024-06-20 DIAGNOSIS — R000 Tachycardia, unspecified: Secondary | ICD-10-CM | POA: Diagnosis not present

## 2024-06-20 DIAGNOSIS — K861 Other chronic pancreatitis: Secondary | ICD-10-CM | POA: Diagnosis not present

## 2024-06-20 DIAGNOSIS — R079 Chest pain, unspecified: Secondary | ICD-10-CM | POA: Diagnosis present

## 2024-06-20 DIAGNOSIS — I10 Essential (primary) hypertension: Secondary | ICD-10-CM | POA: Insufficient documentation

## 2024-06-20 DIAGNOSIS — X58XXXA Exposure to other specified factors, initial encounter: Secondary | ICD-10-CM | POA: Insufficient documentation

## 2024-06-20 DIAGNOSIS — S2231XA Fracture of one rib, right side, initial encounter for closed fracture: Secondary | ICD-10-CM | POA: Insufficient documentation

## 2024-06-20 DIAGNOSIS — R1084 Generalized abdominal pain: Secondary | ICD-10-CM | POA: Insufficient documentation

## 2024-06-20 LAB — BASIC METABOLIC PANEL WITH GFR
Anion gap: 17 — ABNORMAL HIGH (ref 5–15)
BUN: 11 mg/dL (ref 8–23)
CO2: 23 mmol/L (ref 22–32)
Calcium: 9 mg/dL (ref 8.9–10.3)
Chloride: 92 mmol/L — ABNORMAL LOW (ref 98–111)
Creatinine, Ser: 0.9 mg/dL (ref 0.44–1.00)
GFR, Estimated: >60 mL/min (ref >60)
Glucose, Bld: 133 mg/dL — ABNORMAL HIGH (ref 70–99)
Potassium: 3 mmol/L — ABNORMAL LOW (ref 3.5–5.1)
Sodium: 132 mmol/L — ABNORMAL LOW (ref 135–145)

## 2024-06-20 LAB — CBC
HCT: 42.1 % (ref 36.0–46.0)
Hemoglobin: 14.7 g/dL (ref 12.0–15.0)
MCH: 31.6 pg (ref 26.0–34.0)
MCHC: 34.9 g/dL (ref 30.0–36.0)
MCV: 90.5 fL (ref 80.0–100.0)
Platelets: 400 K/uL (ref 150–400)
RBC: 4.65 MIL/uL (ref 3.87–5.11)
RDW: 14.9 % (ref 11.5–15.5)
WBC: 10.5 K/uL (ref 4.0–10.5)
nRBC: 0 % (ref 0.0–0.2)

## 2024-06-20 LAB — TROPONIN I (HIGH SENSITIVITY): Troponin I (High Sensitivity): 5 ng/L (ref <18)

## 2024-06-20 NOTE — ED Triage Notes (Addendum)
 Pt reports chest pain that she developed a few days a go that is sharp and constant, pt also reports some dizziness. Denies known cardiac problems other than intermittent issues with Bp. Pt also reports no BM since her colonoscopy on 9/5

## 2024-06-21 ENCOUNTER — Emergency Department

## 2024-06-21 ENCOUNTER — Emergency Department
Admission: EM | Admit: 2024-06-21 | Discharge: 2024-06-21 | Disposition: A | Discharge status: Home / Self Care | Attending: Emergency Medicine | Admitting: Emergency Medicine

## 2024-06-21 DIAGNOSIS — R079 Chest pain, unspecified: Secondary | ICD-10-CM

## 2024-06-21 DIAGNOSIS — S2231XA Fracture of one rib, right side, initial encounter for closed fracture: Secondary | ICD-10-CM

## 2024-06-21 DIAGNOSIS — K861 Other chronic pancreatitis: Secondary | ICD-10-CM

## 2024-06-21 LAB — TROPONIN I (HIGH SENSITIVITY): Troponin I (High Sensitivity): 5 ng/L (ref <18)

## 2024-06-21 LAB — MAGNESIUM: Magnesium: 1.7 mg/dL (ref 1.7–2.4)

## 2024-06-21 LAB — D-DIMER, QUANTITATIVE: D-Dimer, Quant: 1.52 ug{FEU}/mL — ABNORMAL HIGH (ref 0.00–0.50)

## 2024-06-21 MED ORDER — LIDOCAINE VISCOUS HCL 2 % MT SOLN
15.0000 mL | Freq: Once | OROMUCOSAL | Status: AC
  Administered 2024-06-21: 15 mL via ORAL
  Filled 2024-06-21: qty 15

## 2024-06-21 MED ORDER — OXYCODONE HCL 5 MG PO TABS: 5.0000 mg | ORAL_TABLET | Freq: Three times a day (TID) | ORAL | 0 refills | Status: DC | PRN

## 2024-06-21 MED ORDER — LORAZEPAM 2 MG PO TABS
2.0000 mg | ORAL_TABLET | Freq: Once | ORAL | Status: AC
  Administered 2024-06-21: 2 mg via ORAL
  Filled 2024-06-21: qty 1

## 2024-06-21 MED ORDER — LIDOCAINE 5 % EX PTCH
1.0000 | MEDICATED_PATCH | CUTANEOUS | 0 refills | Status: AC
Start: 2024-06-21 — End: 2024-07-01

## 2024-06-21 MED ORDER — KETOROLAC TROMETHAMINE 15 MG/ML IJ SOLN
15.0000 mg | Freq: Once | INTRAMUSCULAR | Status: AC
  Administered 2024-06-21: 15 mg via INTRAVENOUS
  Filled 2024-06-21: qty 1

## 2024-06-21 MED ORDER — ONDANSETRON HCL 4 MG/2ML IJ SOLN
4.0000 mg | Freq: Once | INTRAMUSCULAR | Status: AC
  Administered 2024-06-21: 4 mg via INTRAVENOUS
  Filled 2024-06-21: qty 2

## 2024-06-21 MED ORDER — POTASSIUM CHLORIDE CRYS ER 20 MEQ PO TBCR
40.0000 meq | EXTENDED_RELEASE_TABLET | Freq: Once | ORAL | Status: AC
  Administered 2024-06-21: 40 meq via ORAL
  Filled 2024-06-21: qty 2

## 2024-06-21 MED ORDER — SODIUM CHLORIDE 0.9 % IV BOLUS
1000.0000 mL | Freq: Once | INTRAVENOUS | Status: AC
  Administered 2024-06-21: 1000 mL via INTRAVENOUS

## 2024-06-21 MED ORDER — ONDANSETRON 4 MG PO TBDP: 4.0000 mg | ORAL_TABLET | Freq: Three times a day (TID) | ORAL | 0 refills | Status: DC | PRN

## 2024-06-21 MED ORDER — ALUM & MAG HYDROXIDE-SIMETH 200-200-20 MG/5ML PO SUSP
30.0000 mL | Freq: Once | ORAL | Status: AC
  Administered 2024-06-21: 30 mL via ORAL
  Filled 2024-06-21: qty 30

## 2024-06-21 MED ORDER — IOHEXOL 350 MG/ML SOLN
75.0000 mL | Freq: Once | INTRAVENOUS | Status: AC | PRN
  Administered 2024-06-21: 75 mL via INTRAVENOUS

## 2024-06-21 MED ORDER — ACETAMINOPHEN 325 MG PO TABS
650.0000 mg | ORAL_TABLET | Freq: Once | ORAL | Status: AC
  Administered 2024-06-21: 650 mg via ORAL
  Filled 2024-06-21: qty 2

## 2024-06-21 MED ORDER — HYDROMORPHONE HCL 1 MG/ML IJ SOLN
1.0000 mg | Freq: Once | INTRAMUSCULAR | Status: AC
  Administered 2024-06-21: 1 mg via INTRAVENOUS
  Filled 2024-06-21: qty 1

## 2024-06-21 NOTE — ED Notes (Signed)
 Pt given incentive spirometer and educated on the use of same.

## 2024-06-21 NOTE — ED Provider Notes (Signed)
 [facility] Provider Note    Event Date/Time   First MD Initiated Contact with Patient 06/21/24 0129     (approximate)   History   Chest Pain   HPI  Rhonda Espinoza is a 63 y.o. female   Past medical history of alcohol use, alcoholic pancreatitis, anxiety, hypertension and hyperlipidemia, who presents to the emergency department with chest and abdominal pain.  He notes having a colonoscopy several weeks ago and since then has had the symptoms.  They are chronic and unchanged.  She denies respiratory infectious symptoms, her chest pain is not exertional.  She has had nausea but no vomiting.  Bowel movements are been normal.  She states that her symptoms feel like pancreatitis but somewhat different.  Continues to drink occasionally.  No GI bleeding.  Independent Historian contributed to assessment above: Husband corroborates information above  External Medical Documents Reviewed: Hospitalization notes from a hospitalization August 2025 for pancreatitis      Physical Exam   Triage Vital Signs: ED Triage Vitals  Encounter Vitals Group     BP 06/20/24 2111 (!) 142/95     Girls Systolic BP Percentile --      Girls Diastolic BP Percentile --      Boys Systolic BP Percentile --      Boys Diastolic BP Percentile --      Pulse Rate 06/20/24 2111 (!) 117     Resp 06/20/24 2111 20     Temp 06/20/24 2111 98.4 F (36.9 C)     Temp Source 06/21/24 0003 Oral     SpO2 06/20/24 2111 94 %     Weight 06/20/24 2110 220 lb (99.8 kg)     Height 06/20/24 2110 5' 7 (1.702 m)     Head Circumference --      Peak Flow --      Pain Score 06/20/24 2110 10     Pain Loc --      Pain Education --      Exclude from Growth Chart --     Most recent vital signs: Vitals:   06/21/24 0003 06/21/24 0130  BP: (!) 152/101 (!) 153/89  Pulse: (!) 116 (!) 104  Resp: 20 19  Temp: 98.3 F (36.8 C)   SpO2: 94% 96%    General: Awake, no distress.  CV:  Good peripheral  perfusion.  Resp:  Normal effort.  Abd:  No distention. Other:  Diffuse mild abdominal tenderness without peritoneal signs.  Breathing comfortably on room air.  Lung sounds equal, nonfocal, no wheezing.  Slightly tachycardic but no tremors or tongue fasciculation.   ED Results / Procedures / Treatments   Labs (all labs ordered are listed, but only abnormal results are displayed) Labs Reviewed  BASIC METABOLIC PANEL WITH GFR - Abnormal; Notable for the following components:      Result Value   Sodium 132 (*)    Potassium 3.0 (*)    Chloride 92 (*)    Glucose, Bld 133 (*)    Anion gap 17 (*)    All other components within normal limits  D-DIMER, QUANTITATIVE (NOT AT [facility]) - Abnormal; Notable for the following components:   D-Dimer, Quant 1.52 (*)    All other components within normal limits  CBC  MAGNESIUM   TROPONIN I (HIGH SENSITIVITY)  TROPONIN I (HIGH SENSITIVITY)     I ordered and reviewed the above labs they are notable for potassium slightly low at 3.0, dimer elevated, flat stable trops  EKG  ED ECG REPORT I, Ginnie Shams, the attending physician, personally viewed and interpreted this ECG.   Date: 06/21/2024  EKG Time: 2107  Rate: 114  Rhythm: sinus tachycardia  Axis: lad  Intervals:nl  ST&T Change: no stemi    RADIOLOGY I independently reviewed and interpreted abdominal x-ray and see no obvious obstructive gas pattern I also reviewed radiologist's formal read.   PROCEDURES:  Critical Care performed: No  Procedures   MEDICATIONS ORDERED IN ED: Medications  sodium chloride  0.9 % bolus 1,000 mL (1,000 mLs Intravenous New Bag/Given 06/21/24 0217)  LORazepam  (ATIVAN ) tablet 2 mg (2 mg Oral Given 06/21/24 0155)  ondansetron  (ZOFRAN ) injection 4 mg (4 mg Intravenous Given 06/21/24 0214)  acetaminophen  (TYLENOL ) tablet 650 mg (650 mg Oral Given 06/21/24 0217)  potassium chloride  SA (KLOR-CON  M) CR tablet 40 mEq (40 mEq Oral Given 06/21/24 0217)  ketorolac   (TORADOL ) 15 MG/ML injection 15 mg (15 mg Intravenous Given 06/21/24 0214)  alum & mag hydroxide-simeth (MAALOX/MYLANTA) 200-200-20 MG/5ML suspension 30 mL (30 mLs Oral Given 06/21/24 0219)    And  lidocaine  (XYLOCAINE ) 2 % viscous mouth solution 15 mL (15 mLs Oral Given 06/21/24 0219)  iohexol  (OMNIPAQUE ) 350 MG/ML injection 75 mL (75 mLs Intravenous Contrast Given 06/21/24 0324)  HYDROmorphone  (DILAUDID ) injection 1 mg (1 mg Intravenous Given 06/21/24 0414)     IMPRESSION / MDM / ASSESSMENT AND PLAN / ED COURSE  I reviewed the triage vital signs and the nursing notes.                                Patient's presentation is most consistent with acute presentation with potential threat to life or bodily function.  Differential diagnosis includes, but is not limited to, pancreatitis, intra-abdominal infection obstruction, perforated viscus, gastritis/GERD/ulcer, alcohol withdrawal, ACS or PE   The patient is on the cardiac monitor to evaluate for evidence of arrhythmia and/or significant heart rate changes.  MDM:    Broad differential diagnosis in this 63 year old with multiple medical comorbidities and chronic pancreatitis.  Fairly benign abdominal exam and a CT scan of the abdomen pelvis showed no surgical changes, could be an exacerbation of her chronic pancreatitis.  Considered ACS given the chest pain, considered PE, started with serial troponins and D-dimer fortunately all look normal except for an elevated dimer and followed up with a CT angiogram of the chest which shows no blood clot but does show signs of a mild acute flare of pancreatitis, as well as a broken single rib.  She does note pain with deep breaths, and when I reevaluate her and tell her about the broken ribs she tells me about a minor fall she had a couple weeks ago after the colonoscopy but no head strike or loss of consciousness and she did injure her side chest wall then.  I think a combination of her pancreatitis and rib  fracture account for her pain.  She feels better after some medications in the emergency department.  She does not appear to be in acute withdrawal.  I considered hospitalization for admission or observation however given her response to treatment, identified injuries including rib fracture, and acute on chronic pancreatitis with pain well-controlled, I think she can be managed as an outpatient with pain medications follow-up with PMD.        FINAL CLINICAL IMPRESSION(S) / ED DIAGNOSES   Final diagnoses:  Nonspecific chest pain  Chronic pancreatitis, unspecified pancreatitis  type [facility])  Closed fracture of one rib of right side, initial encounter     Rx / DC Orders   ED Discharge Orders          Ordered    lidocaine  (LIDODERM ) 5 %  Every 24 hours        06/21/24 0409    ondansetron  (ZOFRAN -ODT) 4 MG disintegrating tablet  Every 8 hours PRN        06/21/24 0409    oxyCODONE  (ROXICODONE ) 5 MG immediate release tablet  Every 8 hours PRN        06/21/24 0409             Note:  This document was prepared using Dragon voice recognition software and may include unintentional dictation errors.    Cyrena Mylar, MD 06/21/24 [phone]

## 2024-06-21 NOTE — ED Notes (Signed)
 Lab called to stick pt due to pt being a hard stick.

## 2024-06-21 NOTE — ED Notes (Signed)
 Pts husband here to transport pt home. Pt discharged to car by this RN via wheelchair.

## 2024-06-21 NOTE — Discharge Instructions (Addendum)
 I think your symptoms are combination of the broken rib and pancreatitis.  Continue taking your pantoprazole  stomach acid reducing medication as prescribed by your doctor.  Take oxycodone  as prescribed for the rib fracture pain.  You can use the pain patches as well.  Use incentive spirometer to encourage yourself to take deep breaths throughout the day to prevent lung infection.  Use Zofran  as needed for nausea/vomiting.  Thank you for choosing us  for your health care today!  Please see your primary doctor this week for a follow up appointment.   If you have any new, worsening, or unexpected symptoms call your doctor right away or come back to the emergency department for reevaluation.  It was my pleasure to care for you today.   Ginnie EDISON Cyrena, MD

## 2024-06-27 NOTE — Progress Notes (Signed)
 Subjective   Patient ID:  Rhonda Espinoza is a 63 y.o. (DOB 01-18-1961) female.   Chief Complaint  Patient presents with  . Follow-up    [facility] ED visit 1 week ago for constipation, patient would to review labs.      HPI  Pt presents for ER follow-up.  [facility] Provider Note  Event Date/Time  First MD Initiated Contact with Patient 06/21/24 0129  (approximate)  History   Chest Pain  HPI  Rhonda Espinoza is a 63 y.o. female  Past medical history of alcohol use, alcoholic pancreatitis, anxiety, hypertension and hyperlipidemia, who presents to the emergency department with chest and abdominal pain. He notes having a colonoscopy several weeks ago and since then has had the symptoms. They are chronic and unchanged.  She denies respiratory infectious symptoms, her chest pain is not exertional. She has had nausea but no vomiting. Bowel movements are been normal.  She states that her symptoms feel like pancreatitis but somewhat different. Continues to drink occasionally. No GI bleeding.  Independent Historian contributed to assessment above: Husband corroborates information above  External Medical Documents Reviewed: Hospitalization notes from a hospitalization August 2025 for pancreatitis  Physical Exam   Triage Vital Signs: ED Triage Vitals  Encounter Vitals Group  BP 06/20/24 2111 (!) 142/95  Girls Systolic BP Percentile --  Girls Diastolic BP Percentile --  Boys Systolic BP Percentile --  Boys Diastolic BP Percentile --  Pulse Rate 06/20/24 2111 (!) 117  Resp 06/20/24 2111 20  Temp 06/20/24 2111 98.4 F (36.9 C)  Temp Source 06/21/24 0003 Oral  SpO2 06/20/24 2111 94 %  Weight 06/20/24 2110 220 lb (99.8 kg)  Height 06/20/24 2110 5' 7 (1.702 m)  Head Circumference --  Peak Flow --  Pain Score 06/20/24 2110 10  Pain Loc --  Pain Education --  Exclude from Growth Chart --   Most recent vital signs: Vitals:  06/21/24 0003 06/21/24  0130  BP: (!) 152/101 (!) 153/89  Pulse: (!) 116 (!) 104  Resp: 20 19  Temp: 98.3 F (36.8 C)  SpO2: 94% 96%   General: Awake, no distress.  CV: Good peripheral perfusion.  Resp: Normal effort.  Abd: No distention. Other: Diffuse mild abdominal tenderness without peritoneal signs. Breathing comfortably on room air. Lung sounds equal, nonfocal, no wheezing. Slightly tachycardic but no tremors or tongue fasciculation.  ED Results / Procedures / Treatments   Labs (all labs ordered are listed, but only abnormal results are displayed) Labs Reviewed  BASIC METABOLIC PANEL WITH GFR - Abnormal; Notable for the following components:  Result Value  Sodium 132 (*)  Potassium 3.0 (*)  Chloride 92 (*)  Glucose, Bld 133 (*)  Anion gap 17 (*)  All other components within normal limits  D-DIMER, QUANTITATIVE (NOT AT [facility]) - Abnormal; Notable for the following components:  D-Dimer, Quant 1.52 (*)  All other components within normal limits  CBC  MAGNESIUM   TROPONIN I (HIGH SENSITIVITY)  TROPONIN I (HIGH SENSITIVITY)   I ordered and reviewed the above labs they are notable for potassium slightly low at 3.0, dimer elevated, flat stable trops  EKG  ED ECG REPORT I, Rhonda Espinoza, the attending physician, personally viewed and interpreted this ECG.  Date: 06/21/2024 EKG Time: 2107 Rate: 114 Rhythm: sinus tachycardia Axis: lad Intervals:nl ST&T Change: no stemi  RADIOLOGY I independently reviewed and interpreted abdominal x-ray and see no obvious obstructive gas pattern I also reviewed radiologist's formal read.  PROCEDURES:  Critical Care performed: No  Procedures  MEDICATIONS ORDERED IN ED: Medications  sodium chloride  0.9 % bolus 1,000 mL (1,000 mLs Intravenous New Bag/Given 06/21/24 0217)  LORazepam  (ATIVAN ) tablet 2 mg (2 mg Oral Given 06/21/24 0155)  ondansetron  (ZOFRAN ) injection 4 mg (4 mg Intravenous Given 06/21/24 0214)  acetaminophen  (TYLENOL ) tablet 650 mg (650 mg  Oral Given 06/21/24 0217)  potassium chloride  SA (KLOR-CON  M) CR tablet 40 mEq (40 mEq Oral Given 06/21/24 0217)  ketorolac  (TORADOL ) 15 MG/ML injection 15 mg (15 mg Intravenous Given 06/21/24 0214)  alum & mag hydroxide-simeth (MAALOX/MYLANTA) 200-200-20 MG/5ML suspension 30 mL (30 mLs Oral Given 06/21/24 0219)  And  lidocaine  (XYLOCAINE ) 2 % viscous mouth solution 15 mL (15 mLs Oral Given 06/21/24 0219)  iohexol  (OMNIPAQUE ) 350 MG/ML injection 75 mL (75 mLs Intravenous Contrast Given 06/21/24 0324)  HYDROmorphone  (DILAUDID ) injection 1 mg (1 mg Intravenous Given 06/21/24 0414)   IMPRESSION / MDM / ASSESSMENT AND PLAN / ED COURSE  I reviewed the triage vital signs and the nursing notes.  Patient's presentation is most consistent with acute presentation with potential threat to life or bodily function.  Differential diagnosis includes, but is not limited to, pancreatitis, intra-abdominal infection obstruction, perforated viscus, gastritis/GERD/ulcer, alcohol withdrawal, ACS or PE  The patient is on the cardiac monitor to evaluate for evidence of arrhythmia and/or significant heart rate changes.  MDM:   Broad differential diagnosis in this 63 year old with multiple medical comorbidities and chronic pancreatitis. Fairly benign abdominal exam and a CT scan of the abdomen pelvis showed no surgical changes, could be an exacerbation of her chronic pancreatitis. Considered ACS given the chest pain, considered PE, started with serial troponins and D-dimer fortunately all look normal except for an elevated dimer and followed up with a CT angiogram of the chest which shows no blood clot but does show signs of a mild acute flare of pancreatitis, as well as a broken single rib. She does note pain with deep breaths, and when I reevaluate her and tell her about the broken ribs she tells me about a minor fall she had a couple weeks ago after the colonoscopy but no head strike or loss of consciousness and she did  injure her side chest wall then. I think a combination of her pancreatitis and rib fracture account for her pain. She feels better after some medications in the emergency department. She does not appear to be in acute withdrawal.  I considered hospitalization for admission or observation however given her response to treatment, identified injuries including rib fracture, and acute on chronic pancreatitis with pain well-controlled, I think she can be managed as an outpatient with pain medications follow-up with PMD.  FINAL CLINICAL IMPRESSION(S) / ED DIAGNOSES   Final diagnoses:  Nonspecific chest pain  Chronic pancreatitis, unspecified pancreatitis type (HCC)  Closed fracture of one rib of right side, initial encounter   Rx / DC Orders   ED Discharge Orders   Ordered  lidocaine  (LIDODERM ) 5 % Every 24 hours  06/21/24 0409  ondansetron  (ZOFRAN -ODT) 4 MG disintegrating tablet Every 8 hours PRN  06/21/24 0409  oxyCODONE  (ROXICODONE ) 5 MG immediate release tablet Every 8 hours PRN  06/21/24 0409   Cyrena Mylar, MD 06/21/24 0433   Pt states that she is feeling better since the ER visit. She had a BM several days after the ER visit. Appetite is improving. Last BM was yesterday - a little hard, but ok. No blood in stool. Denies any n/v.  No fever/chills. No current abd pain. She is drinking plenty of fluids. She is not drinking EtOH.  ER records reviewed in detail - discussed all labs with pt.  Problem List, Past Medical History, Past Surgical History, Past Family History, Social History, Medications, and Allergies, were reviewed and updated as appropriate.   Review of Systems Review of Systems  Constitutional:  Negative for chills, fatigue and fever.  Respiratory:  Negative for cough, chest tightness and shortness of breath.   Cardiovascular:  Negative for chest pain, palpitations and leg swelling.  Gastrointestinal:  Positive for constipation (mild). Negative for abdominal  distention, abdominal pain, blood in stool, diarrhea, nausea and vomiting.  Skin:  Negative for rash.  Neurological:  Negative for dizziness, syncope, weakness, light-headedness and headaches.     Objective   BP 120/88 (BP Location: Left Upper Arm, Patient Position: Sitting)   Pulse 78   Temp 97.6 F (36.4 C) (Oral)   Resp 18   Ht 5' 7 (1.702 m)   Wt 236 lb (107 kg)   LMP 04/02/2014   SpO2 97%   BMI 36.96 kg/m   Physical Exam Vitals and nursing note reviewed.  Constitutional:      General: She is not in acute distress.    Appearance: Normal appearance. She is well-developed. She is not ill-appearing or toxic-appearing.  HENT:     Head: Normocephalic and atraumatic.     Mouth/Throat:     Lips: Pink.     Mouth: Mucous membranes are moist.  Cardiovascular:     Rate and Rhythm: Normal rate and regular rhythm.     Heart sounds: Normal heart sounds, S1 normal and S2 normal. No murmur heard.    No friction rub. No gallop.  Musculoskeletal:     Cervical back: Neck supple.  Pulmonary:     Effort: Pulmonary effort is normal.     Breath sounds: Normal breath sounds. No decreased breath sounds, wheezing, rhonchi or rales.  Abdominal:     General: Abdomen is protuberant.     Palpations: Abdomen is soft. There is no hepatomegaly, splenomegaly or mass.     Tenderness: There is abdominal tenderness (minimal, diffuse discomfort, but no point-specific tenderness.). There is no right CVA tenderness, left CVA tenderness, guarding or rebound.  Skin:    General: Skin is warm and dry.     Findings: No rash.  Neurological:     General: No focal deficit present.     Mental Status: She is alert and oriented to person, place, and time.  Psychiatric:        Mood and Affect: Mood normal.        Behavior: Behavior normal.        Thought Content: Thought content normal.        Judgment: Judgment normal.    --------------------------- Labs last 12 hours No results found for this or any  previous visit (from the past 12 hours).    Impression   1. Idiopathic chronic pancreatitis (*)      2. Closed fracture of one rib of right side with routine healing, subsequent encounter      3. Primary insomnia  zolpidem  tartrate (AMBIEN  CR) 12.5 mg CR tablet    4. Need for pneumococcal vaccine  Pneumococcal 20-Val Conj Vacc (PREVNAR) injection 0.5 mL    5. Fatty liver, alcoholic      6. Need for influenza vaccination  Flucelvax Trivalent Preservative Free Prefilled Syringe       Plan  Reassurance.  Pt has had multiple hospitalizations and ER visits through the years due to pancreatitis. She did not feel like her recent ER visit was consistent with pancreatitis - she was more worried about constipation after he colonoscopy. Fortunately her BM's are now improving. Continue increased fluids and fiber. Recommend regular stool softener as well. LFTs were higher on recent labs than normal and she is wondering if this may be due to the tylenol  that she was given at the ER. This is certainly possible, but discussed that it also may have been due to recent virus. Will discuss further with GI, but plan to repeat these in a few weeks. Continue avoiding EtOH (she has not drank recently). Pt prefers to go back to ambien  cr for sleep. Rib fx is hopefully healing - continue to splint prn and avoid further falls/trauma to the area.  Flu shot and prevnar 20 injections given.  Risks and benefits of the Influenza vaccine and Pneumococcal vaccine have been discussed with the patient/care giver.  Patient/care giver concerns were answered to their satisfaction.  Signs and symptoms of adverse effects and when to seek medical attention if adverse effects occur were discussed with the patient/care giver.  See encounter after visit summary for patient specific instructions.  I have reviewed the information contained in this note and personally verified its accuracy.  I obtained the history of present  illness and personally performed the physical exam.  Patient verbalized to me that they understood what their problem is, what they need to do about it and why it is important that they do it.   Follow up for recheck as needed.    Orders Placed This Encounter  Procedures  . Flucelvax Trivalent Preservative Free Prefilled Syringe     Patient's Medications       * Accurate as of June 27, 2024  2:09 PM. Reflects encounter med changes as of last refresh          New Prescriptions      Instructions  zolpidem  tartrate 12.5 mg CR tablet Commonly known as: AMBIEN  CR Start taking on: June 30, 2024 Replaces: zolpidem  tartrate 10 mg tablet Started by: Tylene Meres  TAKE ONE TABLET BY MOUTH NIGHTLY AT BEDTIME AS NEEDED FOR SLEEP. Do not take with alprazolam .       Continued Medications      Instructions  acetaminophen  500 mg tablet Commonly known as: TYLENOL   1,000 mg, As needed   ALPRAZolam  1 mg tablet Commonly known as: XANAX   TAKE ONE TABLET (1 MG DOSE) BY MOUTH THREE TIMES A DAY AS NEEDED FOR ANXIETY. DO NOT TAKE WITH ZOLPIDEM . MAX DAILY AMOUNT: THREE MG   amLODIPine  besylate 10 mg tablet Commonly known as: NORVASC   10 mg, Oral, Daily   aspirin  EC tablet Commonly known as: ECOTRIN LOW DOSE  81 mg, Daily   atorvastatin  40 mg tablet Commonly known as: LIPITOR  40 mg, Oral, Daily   BD PEN NEEDLE MINI U/F 31G X 5 MM Misc Generic drug: Insulin  Pen Needle  USE AS DIRECTED   * DULoxetine  HCl 60 mg capsule Commonly known as: CYMBALTA   60 mg, Daily   * DULoxetine  HCl 30 mg capsule Commonly known as: CYMBALTA   30 mg, Oral, Daily, Take in addition to the 60mg  capsule for total of 90mg .   fenofibrate  160 mg tablet Commonly known as: TRIGLIDE   160 mg, Oral, Daily   gabapentin  300 mg capsule Commonly known as: NEURONTIN   300 mg, Daily   hyoscyamine 0.375 MG  12 hr tablet Commonly known as: LEVBID,OSCIMIN  0.375 mg, Oral, Every 12 hours as needed    ketoconazole 2 % cream Commonly known as: NIZORAL  As needed   metoprolol  succinate 25 mg 24 hr tablet Commonly known as: TOPROL  XL  25 mg, Oral, Daily   multivitamin with minerals tablet  1 tablet, Daily   nystatin cream Commonly known as: MYCOSTATIN  APPLY TOPICALLY TWICE A DAY   ondansetron  4 mg disintegrating tablet Commonly known as: ZOFRAN -ODT  4 mg, Every 8 hours as needed   pantoprazole  sodium 40 mg tablet Commonly known as: PROTONIX   40 mg, Oral, 30 minutes before breakfast & at bedtime   promethazine  25 MG suppository Commonly known as: PHENERGAN ,PHENADOZ  25 mg, Rectal, Every 6 hours as needed   SLOW FE 45 MG Tbcr Generic drug: Ferrous Sulfate ER  Take by mouth.   Teriparatide 560 MCG/2.24ML Sopn    thiamine  100 mg tablet Commonly known as: VITAMIN B-1  100 mg, Daily   triamcinolone  acetonide 0.1% cream Commonly known as: KENalog   As needed   vitamin D3 (cholecalciferol) 50,000 units Caps Commonly known as: OPTIMAL-D  1 capsule, Oral, 2 times weekly      * * This list has 2 medication(s) that are the same as other medications prescribed for you. Read the directions carefully, and ask your doctor or other care provider to review them with you.          Discontinued Medications    NA sulfate-potassium sulfate-magnesium  sulfate 17.5-3.13-1.6 GM/177ML Commonly known as: SUPREP BOWEL PREP KIT Stopped by: Sheri Beal   zolpidem  tartrate 10 mg tablet Commonly known as: AMBIEN  Replaced by: zolpidem  tartrate 12.5 mg CR tablet Stopped by: Tylene Meres        Risks, benefits, and alternatives of the medications and treatment plan prescribed today were discussed, and patient expressed understanding. Plan follow-up as discussed or as needed if any worsening symptoms or change in condition.    A yearly health maintenance exam was recommended where appropriate.     *Some images could not be shown.

## 2024-07-28 NOTE — H&P (Signed)
 [facility]  History and Physical   Assessment  History of colon polyps Pancreas cyst  Plan  EGD EUS Colonoscopy  History  Rhonda Espinoza is a 63 y.o. female referred by Dr. Amelie. History of Present Illness  Reviewed note from recent office visit; confirmed with patient today; no interval change.   The patient is a pleasant 63 year old female that has a history of hypertension, hyperlipidemia, obesity, osteoporosis, chronic constipation, ADD, depression that was last seen 09/04/2023.  Patient has been seen for pancreatitis in the past.  IgG4 and triglyceride level have been unremarkable other than a triglyceride of 211.  MRCP did not show any pancreatic ductal dilatation but did show a cyst in the tail of the pancreas which was stable.  We were completing yearly MRCP is to watch the cyst.  EGD and colonoscopy 06/05/2023 with polyps, hiatal hernia, gastritis and a 1 year recall was recommended.  Biopsies negative for H. pylori.  MRI was completed 03/04/2023 with small nonenhancing cystic lesion in the pancreatic tail 7 mm.  It was recommended a 55-month follow-up MRI.  Also had fatty liver.   Patient comes in today for follow-up visit.     Currently she reports she is doing pretty well.  No significant abdominal pain, vomiting, rectal bleeding.  Bowels are moving daily.  She has followed up with hepatobiliary surgery and had a recent CT scan with them.  It did not pick up on the cystic lesion in the pancreas.  She is due for colonoscopy which she knows in September.  Past Medical History:  Diagnosis Date  . Anemia 05/06/2023  . Anesthesia complication    PT DENIES THIS- CASE OF PANCREATITIS  . Arthritis   . Clotting disorder (*) 04/30/2015   PT DENIES ANY BLOOD CLOTTING DISORDERS(05/08/23)  nose bleeds  . Colon polyp   . GAD (generalized anxiety disorder)   . GERD (gastroesophageal reflux disease)   . Hiatal hernia   . Hyperlipidemia   . Hypertension   .  MDD (major depressive disorder), recurrent episode, moderate (*)   . Pancreatitis (*)    RECURRENT  . Syncope and collapse 04/2023   R/T HYPOTENSION   Past Surgical History:  Procedure Laterality Date  . Ankle fracture surgery Right    FIRST ONE DONE  . Ankle fracture surgery Left    MOST RECENT- HARDWARE IN BOTH ANKLES  . Broken wrist repair Left 2021   INSERTION OF PLATES AND SCREWS  . Bunionectomy Right 09/10/2021  . Cesarean section    . Cholecystectomy    . Colonoscopy  05/2023  . Fixation kyphoplasty lumbar spine  09/2018  . Fracture surgery    . Hernia repair  2021  . Knee arthroscopy    . Spine surgery    . Upper gastrointestinal endoscopy  05/2023    Allergies[1] Prior to Admission medications  Medication Sig Start Date End Date Taking? Authorizing Provider  acetaminophen  (TYLENOL ) 500 mg tablet Take two tablets (1,000 mg dose) by mouth every 6 (six) hours as needed.    Historical Provider, MD  ALPRAZolam  (XANAX ) 1 mg tablet TAKE ONE TABLET (1 MG DOSE) BY MOUTH THREE TIMES A DAY AS NEEDED FOR ANXIETY. DO NOT TAKE WITH ZOLPIDEM . MAX DAILY AMOUNT: THREE MG 07/18/24  Yes Tylene KATHEE Meres, PA-C  amLODIPine  besylate (NORVASC ) 10 mg tablet Take one tablet (10 mg dose) by mouth daily. Patient taking differently: Take one tablet (10 mg dose) by mouth every morning. 04/13/24  Yes Tylene KATHEE Meres, PA-C  aspirin  (ECOTRIN LOW DOSE) EC tablet Take one tablet (81 mg dose) by mouth every evening.   Yes Historical Provider, MD  atorvastatin  (LIPITOR) 40 mg tablet Take one tablet (40 mg dose) by mouth daily. Patient taking differently: Take one tablet (40 mg dose) by mouth every evening. 02/24/24  Yes Tylene KATHEE Meres, PA-C  BD PEN NEEDLE MINI U/F 31G X 5 MM MISC USE AS DIRECTED 11/11/23   Toribio JAYSON Furbish, RPH  DULoxetine  HCl (CYMBALTA ) 30 mg capsule Take one capsule (30 mg dose) by mouth daily. Take in addition to the 60mg  capsule for total of 90mg . Patient taking differently: Take one capsule (30 mg  dose) by mouth every evening. Take in addition to the 60mg  capsule for total of 90mg  IN THE PM. 06/01/24  Yes Tylene KATHEE Meres, PA-C  DULoxetine  HCl (CYMBALTA ) 60 mg capsule Take one capsule (60 mg dose) by mouth daily. Take in addition to the 30mg  capsule for total of 90mg  IN THE PM.    Historical Provider, MD  fenofibrate  (TRIGLIDE ) 160 mg tablet Take one tablet (160 mg dose) by mouth daily. Patient taking differently: Take one tablet (160 mg dose) by mouth every evening. 04/08/24  Yes Tylene KATHEE Meres, PA-C  Ferrous Sulfate ER (SLOW FE) 45 MG TBCR Take by mouth. Patient not taking: Reported on 06/27/2024    Historical Provider, MD  gabapentin  (NEURONTIN ) 300 mg capsule Take one capsule (300 mg dose) by mouth every evening.   Yes Historical Provider, MD  hyoscyamine (LEVBID,OSCIMIN) 0.375 MG 12 hr tablet Take one tablet (0.375 mg dose) by mouth every 12 (twelve) hours as needed for Cramping. 11/04/23  Yes Tylene KATHEE Meres, PA-C  ketoconazole (NIZORAL) 2 % cream Apply one Application topically as needed. 07/04/21   Historical Provider, MD  metoprolol  succinate (TOPROL  XL) 25 mg 24 hr tablet Take one tablet (25 mg dose) by mouth daily. Patient taking differently: Take one tablet (25 mg dose) by mouth every evening. 04/13/24  Yes Tylene KATHEE Meres, PA-C  Multiple Vitamins-Minerals (MULTIVITAMIN WITH MINERALS) tablet Take one tablet by mouth every evening.   Yes Historical Provider, MD  nystatin (MYCOSTATIN) cream APPLY TOPICALLY TWICE A DAY Patient taking differently: Apply topically 2 (two) times a day as needed. 07/25/21   Ozell JAYSON Reilly, MD  ondansetron  (ZOFRAN -ODT) 4 mg disintegrating tablet Take one tablet (4 mg dose) by mouth every 8 (eight) hours as needed. 06/21/24  Yes Historical Provider, MD  pantoprazole  sodium (PROTONIX ) 40 mg tablet Take one tablet (40 mg dose) by mouth 30 (thirty) minutes before breakfast and at bedtime. 03/18/24  Yes Karl A Pleasant, PA  potassium chloride  (POTASSIUM CHLORIDE ) 10 mEq CR tablet  Take one tablet (10 mEq dose) by mouth daily. 05/31/21 06/25/21  Kyra McClanahan, NP  promethazine  (PHENERGAN ,PHENADOZ) 25 MG suppository Place one suppository (25 mg dose) rectally every 6 (six) hours as needed for Nausea. 12/28/23  Yes Tylene KATHEE Meres, PA-C  Teriparatide 560 MCG/2.24ML SOPN every evening. 06/16/24  Yes Historical Provider, MD  thiamine  100 mg tablet Take one tablet (100 mg dose) by mouth every evening. 12/23/23  Yes Historical Provider, MD  triamcinolone  acetonide (KENALOG ) 0.1% cream Apply one g (1 Application dose) topically as needed. 09/01/21   Historical Provider, MD  vitamin D3, cholecalciferol, (OPTIMAL-D) 50,000 units CAPS TAKE ONE CAPSULE BY MOUTH 2 (TWO) TIMES A WEEK. Patient taking differently: Take one capsule by mouth 2 (two) times a week. TAKE 2 TIME A WEEK.  TAKE ON MONDAY AND  FRIDAY IN THE PM. 10/16/22  Yes Donnice FORBES Lipps, MD  zolpidem  tartrate (AMBIEN  CR) 12.5 mg CR tablet TAKE ONE TABLET BY MOUTH NIGHTLY AT BEDTIME AS NEEDED FOR SLEEP. Do not take with alprazolam . 06/30/24  Yes Tylene KATHEE Meres, PA-C   Social History[2] Family History  Problem Relation Age of Onset  . Arthritis Mother   . Diabetes Brother   . Early death Brother   . Depression Brother   . Diabetes Brother   . Early death Brother   . Early death Brother   . Colon cancer Neg Hx   . Colon polyps Neg Hx    Review of Systems  All other systems reviewed and are negative.        Physical Examination  Temp:  [98.6 F (37 C)] 98.6 F (37 C) Heart Rate:  [98] 98 Resp:  [17] 17 BP: (192)/(91) 192/91 SpO2:  [96 %] 96 % Pain Score:   7 O2 Device: None (Room air)    Physical Exam Vitals reviewed.  Constitutional:      Appearance: Normal appearance.  Cardiovascular:     Rate and Rhythm: Normal rate and regular rhythm.     Pulses: Normal pulses.     Heart sounds: Normal heart sounds.  Pulmonary:     Effort: Pulmonary effort is normal.     Breath sounds: Normal breath sounds.  Abdominal:      Palpations: Abdomen is soft.  Skin:    General: Skin is warm.  Neurological:     Mental Status: She is alert.  Psychiatric:        Mood and Affect: Mood normal.   Results  Labs:  No results found for this or any previous visit (from the past 24 hours). Imaging: No results found.  Coding  Electronically signed: Lennart JONELLE Sanders, MD 07/28/2024 / 7:31 AM       [1] Allergies Allergen Reactions  . Latex Other    CAUSES BLISTERS  . Other Other  . Valsartan -Hydrochlorothiazide  Other    Caused pancreatitis   . Wound Dressings Other    moreso like bandages- these cause blisters  [2] Social History Socioeconomic History  . Marital status: Married  Tobacco Use  . Smoking status: Never    Passive exposure: Never  . Smokeless tobacco: Never  Vaping Use  . Vaping status: Never Used  Substance and Sexual Activity  . Alcohol use: Not Currently  . Drug use: No  . Sexual activity: Yes    Partners: Male    Birth control/protection: Post-menopausal

## 2024-08-17 ENCOUNTER — Emergency Department

## 2024-08-17 ENCOUNTER — Other Ambulatory Visit: Payer: Self-pay

## 2024-08-17 ENCOUNTER — Encounter: Payer: Self-pay | Admitting: Internal Medicine

## 2024-08-17 ENCOUNTER — Inpatient Hospital Stay
Admission: EM | Admit: 2024-08-17 | Discharge: 2024-08-20 | DRG: 439 | Disposition: A | Discharge status: Home / Self Care | Attending: Student | Admitting: Student

## 2024-08-17 DIAGNOSIS — F988 Other specified behavioral and emotional disorders with onset usually occurring in childhood and adolescence: Secondary | ICD-10-CM | POA: Diagnosis present

## 2024-08-17 DIAGNOSIS — Z9049 Acquired absence of other specified parts of digestive tract: Secondary | ICD-10-CM

## 2024-08-17 DIAGNOSIS — E785 Hyperlipidemia, unspecified: Secondary | ICD-10-CM | POA: Diagnosis present

## 2024-08-17 DIAGNOSIS — F32A Depression, unspecified: Secondary | ICD-10-CM | POA: Diagnosis present

## 2024-08-17 DIAGNOSIS — Z91040 Latex allergy status: Secondary | ICD-10-CM

## 2024-08-17 DIAGNOSIS — Z66 Do not resuscitate: Secondary | ICD-10-CM | POA: Diagnosis present

## 2024-08-17 DIAGNOSIS — K859 Acute pancreatitis without necrosis or infection, unspecified: Secondary | ICD-10-CM | POA: Diagnosis not present

## 2024-08-17 DIAGNOSIS — E66812 Obesity, class 2: Secondary | ICD-10-CM | POA: Diagnosis present

## 2024-08-17 DIAGNOSIS — Z9109 Other allergy status, other than to drugs and biological substances: Secondary | ICD-10-CM

## 2024-08-17 DIAGNOSIS — F419 Anxiety disorder, unspecified: Secondary | ICD-10-CM | POA: Diagnosis present

## 2024-08-17 DIAGNOSIS — E876 Hypokalemia: Secondary | ICD-10-CM | POA: Diagnosis present

## 2024-08-17 DIAGNOSIS — Z79899 Other long term (current) drug therapy: Secondary | ICD-10-CM

## 2024-08-17 DIAGNOSIS — Z888 Allergy status to other drugs, medicaments and biological substances status: Secondary | ICD-10-CM

## 2024-08-17 DIAGNOSIS — I10 Essential (primary) hypertension: Secondary | ICD-10-CM | POA: Diagnosis present

## 2024-08-17 DIAGNOSIS — J45909 Unspecified asthma, uncomplicated: Secondary | ICD-10-CM | POA: Diagnosis present

## 2024-08-17 DIAGNOSIS — Z8701 Personal history of pneumonia (recurrent): Secondary | ICD-10-CM

## 2024-08-17 DIAGNOSIS — Z6835 Body mass index (BMI) 35.0-35.9, adult: Secondary | ICD-10-CM

## 2024-08-17 DIAGNOSIS — Z7982 Long term (current) use of aspirin: Secondary | ICD-10-CM

## 2024-08-17 DIAGNOSIS — K5900 Constipation, unspecified: Secondary | ICD-10-CM | POA: Diagnosis present

## 2024-08-17 DIAGNOSIS — K2210 Ulcer of esophagus without bleeding: Secondary | ICD-10-CM | POA: Diagnosis present

## 2024-08-17 DIAGNOSIS — K861 Other chronic pancreatitis: Secondary | ICD-10-CM | POA: Diagnosis present

## 2024-08-17 DIAGNOSIS — Z79890 Hormone replacement therapy (postmenopausal): Secondary | ICD-10-CM

## 2024-08-17 DIAGNOSIS — M1990 Unspecified osteoarthritis, unspecified site: Secondary | ICD-10-CM | POA: Diagnosis present

## 2024-08-17 DIAGNOSIS — K648 Other hemorrhoids: Secondary | ICD-10-CM | POA: Diagnosis present

## 2024-08-17 LAB — CBC WITH DIFFERENTIAL/PLATELET
Abs Immature Granulocytes: 0.04 K/uL (ref 0.00–0.07)
Basophils Absolute: 0 K/uL (ref 0.0–0.1)
Basophils Relative: 1 %
Eosinophils Absolute: 0.1 K/uL (ref 0.0–0.5)
Eosinophils Relative: 1 %
HCT: 37.3 % (ref 36.0–46.0)
Hemoglobin: 12.9 g/dL (ref 12.0–15.0)
Immature Granulocytes: 1 %
Lymphocytes Relative: 18 %
Lymphs Abs: 1.5 K/uL (ref 0.7–4.0)
MCH: 30.8 pg (ref 26.0–34.0)
MCHC: 34.6 g/dL (ref 30.0–36.0)
MCV: 89 fL (ref 80.0–100.0)
Monocytes Absolute: 0.7 K/uL (ref 0.1–1.0)
Monocytes Relative: 9 %
Neutro Abs: 5.7 K/uL (ref 1.7–7.7)
Neutrophils Relative %: 70 %
Platelets: 294 K/uL (ref 150–400)
RBC: 4.19 MIL/uL (ref 3.87–5.11)
RDW: 14.1 % (ref 11.5–15.5)
WBC: 8.1 K/uL (ref 4.0–10.5)
nRBC: 0 % (ref 0.0–0.2)

## 2024-08-17 LAB — COMPREHENSIVE METABOLIC PANEL WITH GFR
ALT: 32 U/L (ref 0–44)
AST: 34 U/L (ref 15–41)
Albumin: 4.1 g/dL (ref 3.5–5.0)
Alkaline Phosphatase: 101 U/L (ref 38–126)
Anion gap: 12 (ref 5–15)
BUN: 9 mg/dL (ref 8–23)
CO2: 28 mmol/L (ref 22–32)
Calcium: 8.9 mg/dL (ref 8.9–10.3)
Chloride: 93 mmol/L — ABNORMAL LOW (ref 98–111)
Creatinine, Ser: 0.81 mg/dL (ref 0.44–1.00)
GFR, Estimated: >60 mL/min (ref >60)
Glucose, Bld: 140 mg/dL — ABNORMAL HIGH (ref 70–99)
Potassium: 3.6 mmol/L (ref 3.5–5.1)
Sodium: 133 mmol/L — ABNORMAL LOW (ref 135–145)
Total Bilirubin: 0.7 mg/dL (ref 0.0–1.2)
Total Protein: 6.9 g/dL (ref 6.5–8.1)

## 2024-08-17 LAB — LIPASE, BLOOD: Lipase: 941 U/L — ABNORMAL HIGH (ref 11–51)

## 2024-08-17 MED ORDER — ZOLPIDEM TARTRATE 5 MG PO TABS
5.0000 mg | ORAL_TABLET | Freq: Every evening | ORAL | Status: DC | PRN
  Administered 2024-08-17 – 2024-08-19 (×3): 5 mg via ORAL
  Filled 2024-08-17 (×3): qty 1

## 2024-08-17 MED ORDER — ATORVASTATIN CALCIUM 20 MG PO TABS
40.0000 mg | ORAL_TABLET | Freq: Every day | ORAL | Status: DC
  Administered 2024-08-17 – 2024-08-20 (×4): 40 mg via ORAL
  Filled 2024-08-17 (×4): qty 2

## 2024-08-17 MED ORDER — MENTHOL 3 MG MT LOZG
1.0000 | LOZENGE | OROMUCOSAL | Status: DC | PRN
  Administered 2024-08-17 – 2024-08-18 (×2): 3 mg via ORAL
  Filled 2024-08-17: qty 9

## 2024-08-17 MED ORDER — DULOXETINE HCL 30 MG PO CPEP: 90.0000 mg | ORAL_CAPSULE | Freq: Every day | ORAL | Status: DC

## 2024-08-17 MED ORDER — ONDANSETRON HCL 4 MG/2ML IJ SOLN
4.0000 mg | Freq: Once | INTRAMUSCULAR | Status: AC
  Administered 2024-08-17: 4 mg via INTRAVENOUS
  Filled 2024-08-17: qty 2

## 2024-08-17 MED ORDER — IOHEXOL 300 MG/ML  SOLN
100.0000 mL | Freq: Once | INTRAMUSCULAR | Status: AC | PRN
  Administered 2024-08-17: 100 mL via INTRAVENOUS

## 2024-08-17 MED ORDER — ACETAMINOPHEN 325 MG PO TABS
650.0000 mg | ORAL_TABLET | Freq: Four times a day (QID) | ORAL | Status: DC | PRN
  Administered 2024-08-17: 650 mg via ORAL

## 2024-08-17 MED ORDER — KETOROLAC TROMETHAMINE 15 MG/ML IJ SOLN
15.0000 mg | Freq: Once | INTRAMUSCULAR | Status: AC
  Administered 2024-08-17: 15 mg via INTRAVENOUS
  Filled 2024-08-17: qty 1

## 2024-08-17 MED ORDER — GABAPENTIN 300 MG PO CAPS
300.0000 mg | ORAL_CAPSULE | Freq: Every evening | ORAL | Status: DC
  Administered 2024-08-17 – 2024-08-19 (×3): 300 mg via ORAL
  Filled 2024-08-17 (×3): qty 1

## 2024-08-17 MED ORDER — FENOFIBRATE 160 MG PO TABS
160.0000 mg | ORAL_TABLET | Freq: Every evening | ORAL | Status: DC
  Administered 2024-08-17: 160 mg via ORAL
  Filled 2024-08-17: qty 1

## 2024-08-17 MED ORDER — LACTATED RINGERS IV BOLUS
1000.0000 mL | Freq: Once | INTRAVENOUS | Status: AC
  Administered 2024-08-17: 1000 mL via INTRAVENOUS

## 2024-08-17 MED ORDER — PANTOPRAZOLE SODIUM 40 MG PO TBEC
40.0000 mg | DELAYED_RELEASE_TABLET | Freq: Every day | ORAL | Status: DC
  Administered 2024-08-17 – 2024-08-20 (×4): 40 mg via ORAL
  Filled 2024-08-17 (×4): qty 1

## 2024-08-17 MED ORDER — ASPIRIN 81 MG PO TBEC
81.0000 mg | DELAYED_RELEASE_TABLET | Freq: Every day | ORAL | Status: DC
  Administered 2024-08-18 – 2024-08-20 (×3): 81 mg via ORAL
  Filled 2024-08-17 (×3): qty 1

## 2024-08-17 MED ORDER — ACETAMINOPHEN 650 MG RE SUPP
650.0000 mg | Freq: Four times a day (QID) | RECTAL | Status: DC | PRN
Start: 2024-08-17 — End: 2024-08-20

## 2024-08-17 MED ORDER — DULOXETINE HCL 30 MG PO CPEP
90.0000 mg | ORAL_CAPSULE | Freq: Every day | ORAL | Status: DC
  Administered 2024-08-18 – 2024-08-19 (×2): 90 mg via ORAL
  Filled 2024-08-17 (×2): qty 3

## 2024-08-17 MED ORDER — SODIUM CHLORIDE 0.9 % IV BOLUS
1000.0000 mL | Freq: Once | INTRAVENOUS | Status: AC
  Administered 2024-08-17: 1000 mL via INTRAVENOUS

## 2024-08-17 MED ORDER — HYDROMORPHONE HCL 1 MG/ML IJ SOLN
1.0000 mg | Freq: Once | INTRAMUSCULAR | Status: AC
  Administered 2024-08-17: 1 mg via INTRAVENOUS
  Filled 2024-08-17: qty 1

## 2024-08-17 MED ORDER — AMLODIPINE BESYLATE 10 MG PO TABS
10.0000 mg | ORAL_TABLET | Freq: Every day | ORAL | Status: DC
  Administered 2024-08-17 – 2024-08-20 (×4): 10 mg via ORAL
  Filled 2024-08-17: qty 1
  Filled 2024-08-17: qty 2
  Filled 2024-08-17 (×2): qty 1

## 2024-08-17 MED ORDER — DULOXETINE HCL 30 MG PO CPEP
60.0000 mg | ORAL_CAPSULE | Freq: Every day | ORAL | Status: DC
  Administered 2024-08-17: 60 mg via ORAL
  Filled 2024-08-17: qty 1

## 2024-08-17 MED ORDER — ALPRAZOLAM 0.5 MG PO TABS: 1.0000 mg | ORAL_TABLET | Freq: Three times a day (TID) | ORAL | Status: DC | PRN

## 2024-08-17 MED ORDER — DULOXETINE HCL 30 MG PO CPEP: 30.0000 mg | ORAL_CAPSULE | Freq: Every day | ORAL | Status: DC

## 2024-08-17 MED ORDER — HYDROMORPHONE HCL 1 MG/ML IJ SOLN
1.0000 mg | INTRAMUSCULAR | Status: DC | PRN
  Administered 2024-08-17 – 2024-08-20 (×15): 1 mg via INTRAVENOUS
  Filled 2024-08-17 (×15): qty 1

## 2024-08-17 MED ORDER — LACTATED RINGERS IV SOLN: INTRAVENOUS | Status: DC

## 2024-08-17 MED ORDER — ENOXAPARIN SODIUM 60 MG/0.6ML IJ SOSY
50.0000 mg | PREFILLED_SYRINGE | INTRAMUSCULAR | Status: DC
  Administered 2024-08-17 – 2024-08-19 (×3): 50 mg via SUBCUTANEOUS
  Filled 2024-08-17 (×3): qty 0.6

## 2024-08-17 MED ORDER — ALPRAZOLAM 0.5 MG PO TABS
1.0000 mg | ORAL_TABLET | Freq: Three times a day (TID) | ORAL | Status: DC | PRN
Start: 2024-08-17 — End: 2024-08-20

## 2024-08-17 MED ORDER — METOPROLOL SUCCINATE ER 25 MG PO TB24
25.0000 mg | ORAL_TABLET | Freq: Every day | ORAL | Status: DC
  Administered 2024-08-17 – 2024-08-20 (×4): 25 mg via ORAL
  Filled 2024-08-17 (×4): qty 1

## 2024-08-17 NOTE — ED Notes (Signed)
 NURSE MEGAN INFORMED OF ASSISTED BED

## 2024-08-17 NOTE — ED Notes (Signed)
 Called lab to add on lipid panel at this time

## 2024-08-17 NOTE — H&P (Addendum)
 History and Physical    Patient: Rhonda Espinoza FMW:[id number] DOB: 10-16-1960 DOA: 08/17/2024 DOS: the patient was seen and examined on 08/17/2024 PCP: Samie Frederick, PA-C  Patient coming from: Home  Chief Complaint:  Chief Complaint  Patient presents with   Abdominal Pain   HPI: Rhonda Espinoza is a 63 y.o. female with medical history significant of HTN, HLD, chronic pancreatitis, anxiety who presented to ED with complaints os 2 wks hx of n/v/abd pain. Pt reports sx started almost immediately after outpt endoscopy which included, colon, egd, and EUS. Symptoms gradually worsened over the following 2 weeks, prompting ED visit  In the ED, lipase was elevated at 941. CT abd was notable for acute pancreatitis. Pt was started on IVF and made NPO. [facility] hospitalist consulted for consideration for admission  Review of Systems: As mentioned in the history of present illness. All other systems reviewed and are negative. Past Medical History:  Diagnosis Date   ADD (attention deficit disorder)    Alcohol withdrawal seizure (HCC)    Alcohol-induced pancreatitis    Anxiety    Arthritis    Asthma    allergy induced per patient   Disc disorder    Hyperlipidemia    Hypertension    Pancreatitis    Pneumonia    Past Surgical History:  Procedure Laterality Date   CESAREAN SECTION     CHOLECYSTECTOMY  2022   FOOT SURGERY Left    FRACTURE SURGERY Right 07/2020   R wrist    HERNIA REPAIR     KNEE ARTHROSCOPY     KYPHOPLASTY N/A 10/13/2018   Procedure: THORACIC 12 KYPHOPLASTY;  Surgeon: Beuford Anes, MD;  Location: [facility] OR;  Service: Orthopedics;  Laterality: N/A;   KYPHOPLASTY N/A 02/05/2021   Procedure: LUMBAR ONE  AND LUMBAR FOUR KYPHOPLASTY;  Surgeon: Beuford Anes, MD;  Location: [facility] OR;  Service: Orthopedics;  Laterality: N/A;   Social History:  reports that she has never smoked. She has never used smokeless tobacco. She reports current alcohol use of about 4.0 standard drinks of alcohol  per week. She reports that she does not use drugs.  Allergies  Allergen Reactions   Latex Other (See Comments)    moreso like bandages- these cause blisters   Wound Dressings Other (See Comments)    moreso like bandages- these cause blisters   Valsartan -Hydrochlorothiazide  Other (See Comments)    Caused pancreatitis     Family History  Problem Relation Age of Onset   Pancreatitis Brother    Seizures Neg Hx     Prior to Admission medications   Medication Sig Start Date End Date Taking? Authorizing Provider  acetaminophen  (TYLENOL ) 500 MG tablet Take 1 tablet (500 mg total) by mouth as needed for mild pain (pain score 1-3) or headache. 05/13/24   Samtani, Jai-Gurmukh, MD  ALPRAZolam  (XANAX ) 1 MG tablet Take 1 mg by mouth 3 (three) times daily as needed for sleep or anxiety. 05/23/21   [provider]  amLODipine  (NORVASC ) 10 MG tablet Take 1 tablet by mouth daily. 05/06/23   [provider]  aspirin  EC 81 MG tablet Take 81 mg by mouth in the morning. Swallow whole.    [provider]  atorvastatin  (LIPITOR) 40 MG tablet Take 40 mg by mouth daily.  06/12/16   [provider]  Cholecalciferol 1.25 MG (50000 UT) capsule Take 50,000 Units by mouth once a week. Mondays 07/28/22   [provider]  desvenlafaxine  (PRISTIQ ) 100 MG 24 hr tablet  Take 100 mg by mouth at bedtime.    [provider]  dicyclomine  (BENTYL ) 20 MG tablet Take 1 tablet (20 mg total) by mouth 2 (two) times daily. Patient taking differently: Take 20 mg by mouth daily as needed. 11/11/23   Elnor Savant A, DO  Eszopiclone 3 MG TABS Take 3 mg by mouth at bedtime as needed.    [provider]  fenofibrate  160 MG tablet Take 160 mg by mouth daily.    [provider]  gabapentin  (NEURONTIN ) 300 MG capsule Take 300 mg by mouth daily. 05/10/24   [provider]  hyoscyamine (LEVBID) 0.375 MG 12 hr tablet Take 0.375 mg by mouth every 12 (twelve) hours as  needed for cramping. 11/04/23   [provider]  metoprolol  succinate (TOPROL -XL) 25 MG 24 hr tablet Take 25 mg by mouth daily. 12/28/23   [provider]  Multiple Vitamins-Minerals (MULTIVITAMIN WITH MINERALS) tablet Take 1 tablet by mouth daily.    [provider]  ondansetron  (ZOFRAN -ODT) 4 MG disintegrating tablet Take 1 tablet (4 mg total) by mouth every 8 (eight) hours as needed for nausea or vomiting. 06/21/24   Cyrena Mylar, MD  oxyCODONE  (ROXICODONE ) 5 MG immediate release tablet Take 1 tablet (5 mg total) by mouth every 8 (eight) hours as needed for up to 12 doses. 06/21/24   Cyrena Mylar, MD  pantoprazole  (PROTONIX ) 40 MG tablet Take 40 mg by mouth 2 (two) times daily.    [provider]  promethazine  (PHENERGAN ) 25 MG tablet Take 1 tablet (25 mg total) by mouth every 6 (six) hours as needed for nausea or vomiting. 11/11/23   Elnor Savant LABOR, DO  Teriparatide 620 MCG/2.48ML SOPN INJECT 20 MCG UNDER THE SKIN 1 TIME A DAY. DISCARD PEN 28 DAYS AFTER INITIAL USE 03/12/23   [provider]  thiamine  (VITAMIN B1) 100 MG tablet Take 1 tablet (100 mg total) by mouth daily. 12/23/23   Fausto Burnard LABOR, DO  triamcinolone  cream (KENALOG ) 0.1 % Apply 1 Application topically 2 (two) times daily. 10/02/23 10/01/24  [provider]    Physical Exam: Vitals:   08/17/24 0804 08/17/24 0808 08/17/24 0900 08/17/24 1118  BP: (!) 147/101  (!) 125/102 (!) 158/78  Pulse: (!) 103  98 96  Resp: 17   17  Temp: 98.5 F (36.9 C)   98.2 F (36.8 C)  TempSrc: Oral   Oral  SpO2: 98%  96% 96%  Weight:  103 kg    Height:       General exam: Awake, laying in bed, in nad Respiratory system: Normal respiratory effort, no wheezing Cardiovascular system: regular rate, s1, s2 Gastrointestinal system: Soft, nondistended, tender Central nervous system: CN2-12 grossly intact, strength intact Extremities: Perfused, no clubbing Skin: Normal skin turgor, no notable skin lesions  seen Psychiatry: Mood normal // no visual hallucinations   Data Reviewed:  Labs reviewed: Na 133, K 3.6, Cr 0.81, Lipase 941, WBC 8.1, Hgb 12.9, Plts 294  Assessment and Plan: Acute on chronic pancreatitis -Suspect likely triggered by recent outpt EUS -Presenting lipase just under 1,000 with CT findings confirming acute pancreatitis -Keep NPO except meds and gradually advance diet as tolerated -Cont IVF hydration as tolerated  HTN -Suboptimally controlled at present, likely made worse from pain -Cont home BP meds  HLD -Cont home meds -Check lipid panel    Advance Care Planning:   Code Status: Prior DNR, confirmed with patient  Consults:   Family Communication: Pt in room  Severity of Illness: The appropriate patient status for this patient is INPATIENT. Inpatient status is judged to be reasonable and necessary in order to provide the required intensity of service to ensure the patient's safety. The patient's presenting symptoms, physical exam findings, and initial radiographic and laboratory data in the context of their chronic comorbidities is felt to place them at high risk for further clinical deterioration. Furthermore, it is not anticipated that the patient will be medically stable for discharge from the hospital within 2 midnights of admission.   * I certify that at the point of admission it is my clinical judgment that the patient will require inpatient hospital care spanning beyond 2 midnights from the point of admission due to high intensity of service, high risk for further deterioration and high frequency of surveillance required.*  Author: Garnette Pelt, MD 08/17/2024 11:46 AM  For on call review [email or web address].

## 2024-08-17 NOTE — ED Notes (Signed)
 Pt assisted to BR. Pt urinated in toilet. Pt assisted back to bed. No other needs verbalized at this time.

## 2024-08-17 NOTE — ED Triage Notes (Signed)
 Pt arrives via EMS for abdominal pain, n/v/d x2 weeks. Pt had endoscopy and colonoscopy 2 weeks ago. Pt reports dark brown emesis and bright red blood in her stool yesterday. Pt reports feeling light headed.   CBG 160

## 2024-08-17 NOTE — ED Notes (Signed)
 RN called phlebotomy due to hard stick.

## 2024-08-17 NOTE — ED Provider Notes (Signed)
 [facility] Provider Note    Event Date/Time   First MD Initiated Contact with Patient 08/17/24 0801     (approximate)   History   Chief Complaint: Abdominal Pain   HPI  Rhonda Espinoza is a 63 y.o. female with a history of hypertension, chronic pancreatitis who comes ED complaining of upper abdominal pain, recurrent vomiting for the last 2 weeks.  Started after having colonoscopy and upper endoscopy at [facility].  Denies hematemesis or black or bloody stool.   07/28/24 colonoscopy and EGD/EUS at [facility] showed: 5 mm sessile polyp present in the proximal rectum, removed with cold snare.  Small internal hemorrhoids present on retroflexion of the rectum.   Erosive esophagitis     Past Medical History:  Diagnosis Date   ADD (attention deficit disorder)    Alcohol withdrawal seizure (HCC)    Alcohol-induced pancreatitis    Anxiety    Arthritis    Asthma    allergy induced per patient   Disc disorder    Hyperlipidemia    Hypertension    Pancreatitis    Pneumonia     Current Outpatient Rx   Order #: [id number] Class: Historical Med   Order #: [id number] Class: Historical Med   Order #: [id number] Class: Historical Med   Order #: [id number] Class: Historical Med   Order #: [id number] Class: Historical Med   Order #: [id number] Class: Historical Med   Order #: [id number] Class: Historical Med   Order #: [id number] Class: Historical Med   Order #: [id number] Class: Historical Med   Order #: [id number] Class: Historical Med   Order #: [id number] Class: Historical Med   Order #: [id number] Class: Historical Med   Order #: [id number] Class: Historical Med   Order #: [id number] Class: Historical Med   Order #: [id number] Class: Normal   Order #: [id number] Class: Historical Med   Order #: [id number] Class: No Print   Order #: [id number] Class: Historical Med   Order #: [id number] Class: Normal   Order #: [id number] Class: Historical Med   Order #: [id number] Class:  Normal   Order #: [id number] Class: Normal   Order #: [id number] Class: Normal   Order #: [id number] Class: Historical Med    Past Surgical History:  Procedure Laterality Date   CESAREAN SECTION     CHOLECYSTECTOMY  2022   FOOT SURGERY Left    FRACTURE SURGERY Right 07/2020   R wrist    HERNIA REPAIR     KNEE ARTHROSCOPY     KYPHOPLASTY N/A 10/13/2018   Procedure: THORACIC 12 KYPHOPLASTY;  Surgeon: Beuford Anes, MD;  Location: [facility] OR;  Service: Orthopedics;  Laterality: N/A;   KYPHOPLASTY N/A 02/05/2021   Procedure: LUMBAR ONE  AND LUMBAR FOUR KYPHOPLASTY;  Surgeon: Beuford Anes, MD;  Location: [facility] OR;  Service: Orthopedics;  Laterality: N/A;    Physical Exam   Triage Vital Signs: ED Triage Vitals [08/17/24 0803]  Encounter Vitals Group     BP      Girls Systolic BP Percentile      Girls Diastolic BP Percentile      Boys Systolic BP Percentile      Boys Diastolic BP Percentile      Pulse      Resp      Temp      Temp src      SpO2      Weight      Height 5' 7 (1.702 m)     Head Circumference      Peak Flow      Pain Score 9  Pain Loc      Pain Education      Exclude from Growth Chart     Most recent vital signs: Vitals:   08/17/24 1307 08/17/24 1330  BP: 136/70 134/70  Pulse: 91   Resp: 10 13  Temp:    SpO2: 98%     General: Awake, no distress.  CV:  Good peripheral perfusion.  Regular rate rhythm Resp:  Normal effort.  Clear lungs Abd:  No distention.  Soft with upper abdominal tenderness Other:  No lower extremity edema.   ED Results / Procedures / Treatments   Labs (all labs ordered are listed, but only abnormal results are displayed) Labs Reviewed  COMPREHENSIVE METABOLIC PANEL WITH GFR - Abnormal; Notable for the following components:      Result Value   Sodium 133 (*)    Chloride 93 (*)    Glucose, Bld 140 (*)    All other components within normal limits  LIPASE, BLOOD - Abnormal; Notable for the following components:   Lipase 941 (*)     All other components within normal limits  CBC WITH DIFFERENTIAL/PLATELET  LIPID PANEL     EKG Interpreted by me Sinus tachycardia rate 103.  Normal axis and intervals.  Normal QRS ST segments T waves   RADIOLOGY CT abdomen pelvis interpreted by me, no abdominal free air or ileus.  Radiology report reviewed   PROCEDURES:  Procedures   MEDICATIONS ORDERED IN ED: Medications  HYDROmorphone  (DILAUDID ) injection 1 mg (1 mg Intravenous Given 08/17/24 1311)  enoxaparin  (LOVENOX ) injection 50 mg (has no administration in time range)  lactated ringers  infusion ( Intravenous New Bag/Given 08/17/24 1345)  acetaminophen  (TYLENOL ) tablet 650 mg (has no administration in time range)    Or  acetaminophen  (TYLENOL ) suppository 650 mg (has no administration in time range)  amLODipine  (NORVASC ) tablet 10 mg (10 mg Oral Given 08/17/24 1307)  atorvastatin  (LIPITOR) tablet 40 mg (40 mg Oral Given 08/17/24 1307)  DULoxetine  (CYMBALTA ) DR capsule 60 mg (60 mg Oral Given 08/17/24 1345)  fenofibrate  tablet 160 mg (has no administration in time range)  gabapentin  (NEURONTIN ) capsule 300 mg (has no administration in time range)  metoprolol  succinate (TOPROL -XL) 24 hr tablet 25 mg (25 mg Oral Given 08/17/24 1345)  pantoprazole  (PROTONIX ) EC tablet 40 mg (40 mg Oral Given 08/17/24 1307)  ALPRAZolam  (XANAX ) tablet 1 mg (has no administration in time range)  aspirin  EC tablet 81 mg (has no administration in time range)  DULoxetine  (CYMBALTA ) DR capsule 30 mg (has no administration in time range)  sodium chloride  0.9 % bolus 1,000 mL (0 mLs Intravenous Stopped 08/17/24 1156)  ondansetron  (ZOFRAN ) injection 4 mg (4 mg Intravenous Given 08/17/24 0954)  HYDROmorphone  (DILAUDID ) injection 1 mg (1 mg Intravenous Given 08/17/24 0953)  iohexol  (OMNIPAQUE ) 300 MG/ML solution 100 mL (100 mLs Intravenous Contrast Given 08/17/24 1021)  ketorolac  (TORADOL ) 15 MG/ML injection 15 mg (15 mg Intravenous Given 08/17/24  1114)  lactated ringers  bolus 1,000 mL (0 mLs Intravenous Stopped 08/17/24 1339)     IMPRESSION / MDM / ASSESSMENT AND PLAN / ED COURSE  I reviewed the triage vital signs and the nursing notes.  DDx: Gastritis, pancreatitis, GI perforation, ileus, intra-abdominal abscess  Patient's presentation is most consistent with acute presentation with potential threat to life or bodily function.  Patient presents with persistent abdominal pain and vomiting after recent endoscopy.  Will give IV fluids, IV Dilaudid , check labs and CT.   Clinical Course as of 08/17/24 1411  Wed Aug 17, 2024  1142 CT shows acute pancreatitis.  Case discussed with hospitalist for further management. [PS]    Clinical Course User Index [PS] Viviann Pastor, MD     FINAL CLINICAL IMPRESSION(S) / ED DIAGNOSES   Final diagnoses:  Acute pancreatitis, unspecified complication status, unspecified pancreatitis type     Rx / DC Orders   ED Discharge Orders     None        Note:  This document was prepared using Dragon voice recognition software and may include unintentional dictation errors.   Viviann Pastor, MD 08/17/24 [phone]

## 2024-08-18 DIAGNOSIS — K859 Acute pancreatitis without necrosis or infection, unspecified: Secondary | ICD-10-CM | POA: Diagnosis not present

## 2024-08-18 LAB — LIPID PANEL
Cholesterol: 185 mg/dL (ref 0–200)
HDL: 44 mg/dL (ref >40)
LDL Cholesterol: 89 mg/dL (ref 0–99)
Total CHOL/HDL Ratio: 4.2 ratio
Triglycerides: 261 mg/dL — ABNORMAL HIGH (ref <150)
VLDL: 52 mg/dL — ABNORMAL HIGH (ref 0–40)

## 2024-08-18 LAB — COMPREHENSIVE METABOLIC PANEL WITH GFR
ALT: 38 U/L (ref 0–44)
AST: 77 U/L — ABNORMAL HIGH (ref 15–41)
Albumin: 3.6 g/dL (ref 3.5–5.0)
Alkaline Phosphatase: 90 U/L (ref 38–126)
Anion gap: 9 (ref 5–15)
BUN: 7 mg/dL — ABNORMAL LOW (ref 8–23)
CO2: 27 mmol/L (ref 22–32)
Calcium: 8.3 mg/dL — ABNORMAL LOW (ref 8.9–10.3)
Chloride: 102 mmol/L (ref 98–111)
Creatinine, Ser: 0.7 mg/dL (ref 0.44–1.00)
GFR, Estimated: >60 mL/min (ref >60)
Glucose, Bld: 98 mg/dL (ref 70–99)
Potassium: 3.4 mmol/L — ABNORMAL LOW (ref 3.5–5.1)
Sodium: 139 mmol/L (ref 135–145)
Total Bilirubin: 0.6 mg/dL (ref 0.0–1.2)
Total Protein: 6.1 g/dL — ABNORMAL LOW (ref 6.5–8.1)

## 2024-08-18 LAB — MAGNESIUM: Magnesium: 1.5 mg/dL — ABNORMAL LOW (ref 1.7–2.4)

## 2024-08-18 LAB — CBC
HCT: 37.2 % (ref 36.0–46.0)
Hemoglobin: 12 g/dL (ref 12.0–15.0)
MCH: 29.9 pg (ref 26.0–34.0)
MCHC: 32.3 g/dL (ref 30.0–36.0)
MCV: 92.8 fL (ref 80.0–100.0)
Platelets: 237 K/uL (ref 150–400)
RBC: 4.01 MIL/uL (ref 3.87–5.11)
RDW: 14.4 % (ref 11.5–15.5)
WBC: 4.6 K/uL (ref 4.0–10.5)
nRBC: 0 % (ref 0.0–0.2)

## 2024-08-18 LAB — LIPASE, BLOOD: Lipase: 81 U/L — ABNORMAL HIGH (ref 11–51)

## 2024-08-18 LAB — PHOSPHORUS: Phosphorus: 3.1 mg/dL (ref 2.5–4.6)

## 2024-08-18 MED ORDER — POLYETHYLENE GLYCOL 3350 17 G PO PACK
17.0000 g | PACK | Freq: Two times a day (BID) | ORAL | Status: DC
  Administered 2024-08-18: 17 g via ORAL
  Filled 2024-08-18 (×3): qty 1

## 2024-08-18 MED ORDER — POTASSIUM CHLORIDE 20 MEQ PO PACK
40.0000 meq | PACK | Freq: Once | ORAL | Status: AC
  Administered 2024-08-18: 40 meq via ORAL
  Filled 2024-08-18: qty 2

## 2024-08-18 MED ORDER — BISACODYL 10 MG RE SUPP: 10.0000 mg | Freq: Every day | RECTAL | Status: DC | PRN

## 2024-08-18 MED ORDER — LACTATED RINGERS IV SOLN: INTRAVENOUS | Status: DC

## 2024-08-18 MED ORDER — BISACODYL 5 MG PO TBEC
10.0000 mg | DELAYED_RELEASE_TABLET | Freq: Every day | ORAL | Status: DC
  Administered 2024-08-18: 10 mg via ORAL
  Filled 2024-08-18: qty 2

## 2024-08-18 MED ORDER — MAGNESIUM SULFATE 2 GM/50ML IV SOLN
2.0000 g | Freq: Once | INTRAVENOUS | Status: AC
  Administered 2024-08-18: 2 g via INTRAVENOUS
  Filled 2024-08-18: qty 50

## 2024-08-18 NOTE — Progress Notes (Signed)
 [facility] Progress Note  Patient: Rhonda Espinoza    FMW:[id number]  DOA: 08/17/2024     Date of Service: the patient was seen and examined on 08/18/2024  Chief Complaint  Patient presents with   Abdominal Pain   Brief hospital course: Rhonda Espinoza is a 63 y.o. female with medical history significant of HTN, HLD, chronic pancreatitis, anxiety who presented to ED with complaints os 2 wks hx of n/v/abd pain. Pt reports sx started almost immediately after outpt endoscopy which included, colon, egd, and EUS. Symptoms gradually worsened over the following 2 weeks, prompting ED visit   In the ED, lipase was elevated at 941. CT abd was notable for acute pancreatitis. Pt was started on IVF and made NPO. [facility] consulted for consideration for admission   Assessment and Plan:  # Acute on chronic pancreatitis -Suspect likely triggered by recent outpt EUS -Presenting lipase just under 1,000 with CT findings confirming acute pancreatitis Lipase level 941 >81 trending down Continue IV fluid for hydration Started clear liquid diet Continue symptomatic treatment for pain control and nausea vomiting  # Hypokalemia, potassium repleted. # Hypomagnesemia, mag repleted. Monitor electrolytes daily and replete as needed.  # HTN -Cont home BP meds Monitor BP and titrate medications:  # HLD Lipid profile LDL 89, triglyceride 261 elevated Continued Lipitor 40 mg p.o. daily home dose Held fenofibrate  due to acute pancreatitis  # Depression, continued Cymbalta   # Constipation, started laxatives.   Body mass index is 35.55 kg/m.  Interventions:  Diet: CLD DVT Prophylaxis: Subcutaneous Lovenox    Advance goals of care discussion: DNR-limited  Family Communication: family was not present at bedside, at the time of interview.  The pt provided permission to discuss medical plan with the family. Opportunity was given to ask question and all questions were answered satisfactorily.    Disposition:  Pt is from home, admitted with acute pancreatitis, still has pain and she is on clear liquid diet, which precludes a safe discharge. Discharge to home, when stable, may need few days to improve.  Subjective: No significant events overnight.  Patient is abdominal pain is slightly better, still has pain 8-9/10, feels abdominal tightness, no nausea vomiting, decreased appetite.  Last bowel movement was 4 to 5 days ago.  Passing gas   Physical Exam: General: NAD, lying comfortably Appear in no distress, affect appropriate Eyes: PERRLA ENT: Oral Mucosa Clear, moist  Neck: no JVD,  Cardiovascular: S1 and S2 Present, no Murmur,  Respiratory: good respiratory effort, Bilateral Air entry equal and Decreased, no Crackles, no wheezes Abdomen: BS present, Soft, obese, periumbilical and upper abdominal tenderness, Skin: no rashes Extremities: no Pedal edema, no calf tenderness Neurologic: without any new focal findings Gait not checked due to patient safety concerns  Vitals:   08/17/24 1558 08/17/24 2007 08/18/24 0403 08/18/24 0800  BP: (!) 142/84 124/68 100/80 130/62  Pulse: 75 72 70 65  Resp: 17 17 18 18   Temp: 98.2 F (36.8 C) (!) 97.5 F (36.4 C) 97.9 F (36.6 C)   TempSrc:  Oral Oral   SpO2: 95% 93% 98% 99%  Weight:      Height:        Intake/Output Summary (Last 24 hours) at 08/18/2024 1504 Last data filed at 08/18/2024 1324 Gross per 24 hour  Intake 600 ml  Output --  Net 600 ml   Filed Weights   08/17/24 0808  Weight: 103 kg    Data Reviewed: I have personally reviewed and interpreted  daily labs, tele strips, imagings as discussed above. I reviewed all nursing notes, pharmacy notes, vitals, pertinent old records I have discussed plan of care as described above with RN and patient/family.  CBC: Recent Labs  Lab 08/17/24 0901 08/18/24 0726  WBC 8.1 4.6  NEUTROABS 5.7  --   HGB 12.9 12.0  HCT 37.3 37.2  MCV 89.0 92.8  PLT 294 237   Basic  Metabolic Panel: Recent Labs  Lab 08/17/24 0901 08/18/24 0726  NA 133* 139  K 3.6 3.4*  CL 93* 102  CO2 28 27  GLUCOSE 140* 98  BUN 9 7*  CREATININE 0.81 0.70  CALCIUM  8.9 8.3*  MG  --  1.5*  PHOS  --  3.1    Studies: No results found.  Scheduled Meds:  amLODipine   10 mg Oral Daily   aspirin  EC  81 mg Oral Daily   atorvastatin   40 mg Oral Daily   bisacodyl   10 mg Oral QHS   DULoxetine   90 mg Oral QHS   enoxaparin  (LOVENOX ) injection  50 mg Subcutaneous Q24H   gabapentin   300 mg Oral QPM   metoprolol  succinate  25 mg Oral Daily   pantoprazole   40 mg Oral Daily   polyethylene glycol  17 g Oral BID   Continuous Infusions:  lactated ringers  100 mL/hr at 08/18/24 1058   PRN Meds: acetaminophen  **OR** acetaminophen , ALPRAZolam , bisacodyl , HYDROmorphone  (DILAUDID ) injection, menthol , zolpidem   Time spent: 55 minutes  Author: ELVAN SOR. MD [facility] 08/18/2024 3:04 PM  To reach On-call, see care teams to locate the attending and reach out to them via [email or web address]. If 7PM-7AM, please contact night-coverage If you still have difficulty reaching the attending provider, please page the [facility] (Director on Call) for [facility] on amion for assistance.

## 2024-08-18 NOTE — Discharge Instructions (Signed)
 Rent/Utility/Housing  Agency Name: [facility] Agency Address: [address] [place], [place] [address] Phone: [phone] Email: [email or web address] Website: [email or web address] Service(s) Offered: Housing services, self-sufficiency, congregate meal program, weatherization program, Field seismologist program, emergency food assistance,  housing counseling, home ownership program, wheels -towork program.  Agency Name: [facility] Address: [address] [place], [place] [address] Phone: [phone] (8a-4p) [phone] (8p- 10p) Email: [email or web address] Website: [email or web address] Service(s) Offered: A program for homeless and/or needy men that includes one-on-one counseling, life skills training and job rehabilitation.  Agency Name: [facility] [place] Address: [address] [place], [place] [address] Phone: [phone] Website: www.[email or web address].org Service(s) Offered: Assistance to needy in emergency with utility bills, heating fuel, and prescriptions. Shelter for homeless 7pm-7am. January 22, 2017 15  Agency Name: [facility] [place] [facility] Address: [address], [place], [place] [address] Phone: [phone] Contact Person: Cathleen Corti Email: [email or web address] Website: [email or web address] Service(s) Offered: Helps individuals with developmental disabilities move from housing that is more restrictive to homes where they  can achieve greater independence and have more  opportunities.  Agency Name: [facility] Address: [address], [place], [place] [address] Phone: [phone] Email: [email or web address] Website: www.[email or web address].org Service(s) Offered: Provides affordable housing for low-income families, elderly, and disabled individuals. Offer a wide range of  programs and services, from financial planning to  afterschool and summer programs.  Agency Name: [facility] Address: [address] [place], [place] [address] Phone: [phone] Service(s) Offered: Child support services; child welfare services; food stamps; Medicaid; work first family assistance; and aid with fuel,  rent, food and medicine.  Agency Name: [facility] [place] [employer]. Address: [facility] [address], [place], [place]  [address] Phone: [phone] Website: www.familyabuseservices.org Service(s) Offered: 24 hour Crisis Line: [phone]; 24 hour Emergency Shelter; Transitional Housing; Support Groups; Scientist, physiological; [employer]; Hispanic Outreach: [phone];  Visitation Center: [phone].  Agency Name: [facility] [place]. Address: [address] [place], [place] [address] Phone: [phone] Service(s) Offered: CAP Services; Home and [employer]; Individual or Group Supports; Respite Care Non-Institutional Nursing;  Residential Supports; Respite Care and Personal Care Services; Transportation; Family and Friends Night; Recreational Activities; Three Nutritious Meals/Snacks; Consultation with Registered Dietician; Twenty-four hour Registered Nurse Access; Daily and [employer]; Camp [employer] Leaves; [place] for the [employer] (During Summer Months) Bingo Night (Every  Wednesday Night); Special Populations Dance Night  (Every Tuesday Night); Professional Hair Care Services.  Agency Name: [facility] Address: [address], [place], [place] [address] Phone: [phone] Contact Person: Jabier Mutton Website: [email or web address].Physicist, medical) Offered: Residential treatment facility for women; food and  clothing, educational & employment development and  transportation to work; Counsellor of financial skills;  parenting and family reunification; emotional and spiritual  support;  transitional housing for program graduates.  Agency Name: [facility] Address: [address] [place], [place] [address] Phone: [phone] Email: [email or web address] Website: TaskTown.es Service(s) Offered: Public housing units for elderly, disabled, and low income people; housing choice vouchers for income eligible  applicants; shelter plus care vouchers; and Psychologist, clinical.  Agency Name: [facility] [employer] Address: [address] [place], [place] [address] Phone: [phone] Email: [email or web address]@[email or web address] Website: www.habitatalamance.org Service(s) Offered: Build houses for families in need of decent housing. Each adult in the family must invest 200 hours of labor on  someone else's house, work with volunteers to build their own house, attend classes  on budgeting, home maintenance, yard care, and attend homeowner association meetings.  Agency Name: [facility] Address: [address], [place], [place] [address] Phone: [phone] Website: www.rsli.org Service(s) Offered: Intermediate care facilities for intellectually delayed, Supervised Living in group homes for adults with developmental disabilities, Supervised Living for people who have dual diagnoses (MRMI), Independent Living, Supported Living, respite and a variety of CAP services, pre-vocational services, day supports, and [employer].  Agency Name: [place] [employer] Phone: [phone] Website: www.NCForeclosurePrevention.gov Service(s) Offered: Zero-interest, deferred loans to homeowners struggling to pay their mortgage. Call for more information.

## 2024-08-18 NOTE — TOC CM/SW Note (Signed)
 Transition of Care [facility]) - Inpatient Brief Assessment   Patient Details  Name: Rhonda Espinoza MRN: [id number] Date of Birth: 1961/01/25  Transition of Care [facility] CM/SW Contact:    Corean ONEIDA Haddock, RN Phone Number: 08/18/2024, 1:50 PM   Clinical Narrative:   Transition of Care (TOC) Screening Note   Patient Details  Name: Rhonda Espinoza Date of Birth: 08/02/1961   Transition of Care [facility]) CM/SW Contact:    Corean ONEIDA Haddock, RN Phone Number: 08/18/2024, 1:50 PM    Transition of Care Department [facility]) has reviewed patient and no TOC needs have been identified at this time. . If new patient transition needs arise, please place a TOC consult.  Per SDOH housing resources added to AVS    Transition of Care Asessment: Insurance and Status: Insurance coverage has been reviewed Patient has primary care physician: Yes     Prior/Current Home Services: No current home services Social Drivers of Health Review: SDOH reviewed interventions complete Readmission risk has been reviewed: Yes Transition of care needs: no transition of care needs at this time

## 2024-08-19 ENCOUNTER — Encounter: Payer: Self-pay | Admitting: Internal Medicine

## 2024-08-19 DIAGNOSIS — K859 Acute pancreatitis without necrosis or infection, unspecified: Secondary | ICD-10-CM | POA: Diagnosis not present

## 2024-08-19 LAB — HEPATIC FUNCTION PANEL
ALT: 33 U/L (ref 0–44)
AST: 50 U/L — ABNORMAL HIGH (ref 15–41)
Albumin: 3.5 g/dL (ref 3.5–5.0)
Alkaline Phosphatase: 87 U/L (ref 38–126)
Bilirubin, Direct: 0.2 mg/dL (ref 0.0–0.2)
Indirect Bilirubin: 0.3 mg/dL (ref 0.3–0.9)
Total Bilirubin: 0.5 mg/dL (ref 0.0–1.2)
Total Protein: 5.9 g/dL — ABNORMAL LOW (ref 6.5–8.1)

## 2024-08-19 LAB — BASIC METABOLIC PANEL WITH GFR
Anion gap: 11 (ref 5–15)
BUN: <5 mg/dL — ABNORMAL LOW (ref 8–23)
CO2: 24 mmol/L (ref 22–32)
Calcium: 8.1 mg/dL — ABNORMAL LOW (ref 8.9–10.3)
Chloride: 103 mmol/L (ref 98–111)
Creatinine, Ser: 0.53 mg/dL (ref 0.44–1.00)
GFR, Estimated: >60 mL/min (ref >60)
Glucose, Bld: 108 mg/dL — ABNORMAL HIGH (ref 70–99)
Potassium: 3.3 mmol/L — ABNORMAL LOW (ref 3.5–5.1)
Sodium: 138 mmol/L (ref 135–145)

## 2024-08-19 LAB — CBC
HCT: 35.1 % — ABNORMAL LOW (ref 36.0–46.0)
Hemoglobin: 11.5 g/dL — ABNORMAL LOW (ref 12.0–15.0)
MCH: 30 pg (ref 26.0–34.0)
MCHC: 32.8 g/dL (ref 30.0–36.0)
MCV: 91.6 fL (ref 80.0–100.0)
Platelets: 214 K/uL (ref 150–400)
RBC: 3.83 MIL/uL — ABNORMAL LOW (ref 3.87–5.11)
RDW: 14.6 % (ref 11.5–15.5)
WBC: 4.3 K/uL (ref 4.0–10.5)
nRBC: 0 % (ref 0.0–0.2)

## 2024-08-19 LAB — MAGNESIUM: Magnesium: 1.6 mg/dL — ABNORMAL LOW (ref 1.7–2.4)

## 2024-08-19 LAB — PHOSPHORUS: Phosphorus: 2.1 mg/dL — ABNORMAL LOW (ref 2.5–4.6)

## 2024-08-19 LAB — LIPASE, BLOOD: Lipase: 47 U/L (ref 11–51)

## 2024-08-19 MED ORDER — POTASSIUM CHLORIDE 20 MEQ PO PACK
40.0000 meq | PACK | Freq: Once | ORAL | Status: AC
  Administered 2024-08-19: 40 meq via ORAL
  Filled 2024-08-19: qty 2

## 2024-08-19 MED ORDER — LACTATED RINGERS IV SOLN: INTRAVENOUS | Status: AC

## 2024-08-19 MED ORDER — MAGNESIUM SULFATE 2 GM/50ML IV SOLN
2.0000 g | Freq: Once | INTRAVENOUS | Status: AC
  Administered 2024-08-19: 2 g via INTRAVENOUS
  Filled 2024-08-19: qty 50

## 2024-08-19 MED ORDER — K PHOS MONO-SOD PHOS DI & MONO 155-852-130 MG PO TABS
500.0000 mg | ORAL_TABLET | Freq: Three times a day (TID) | ORAL | Status: DC
  Administered 2024-08-19 – 2024-08-20 (×4): 500 mg via ORAL
  Filled 2024-08-19 (×5): qty 2

## 2024-08-19 MED ORDER — POLYETHYLENE GLYCOL 3350 17 G PO PACK: 17.0000 g | PACK | Freq: Every day | ORAL | Status: DC

## 2024-08-19 MED ORDER — POTASSIUM PHOSPHATES 15 MMOLE/5ML IV SOLN: 30.0000 mmol | Freq: Once | INTRAVENOUS | Status: DC

## 2024-08-19 MED ORDER — OXYCODONE HCL 5 MG PO TABS
5.0000 mg | ORAL_TABLET | Freq: Four times a day (QID) | ORAL | Status: DC | PRN
  Administered 2024-08-19 (×2): 5 mg via ORAL
  Filled 2024-08-19 (×2): qty 1

## 2024-08-19 NOTE — Plan of Care (Signed)

## 2024-08-19 NOTE — Progress Notes (Signed)
 [facility] Progress Note  Patient: Rhonda Espinoza    FMW:[id number]  DOA: 08/17/2024     Date of Service: the patient was seen and examined on 08/19/2024  Chief Complaint  Patient presents with   Abdominal Pain   Brief hospital course: TOYIA JELINEK is a 63 y.o. female with medical history significant of HTN, HLD, chronic pancreatitis, anxiety who presented to ED with complaints os 2 wks hx of n/v/abd pain. Pt reports sx started almost immediately after outpt endoscopy which included, colon, egd, and EUS. Symptoms gradually worsened over the following 2 weeks, prompting ED visit   In the ED, lipase was elevated at 941. CT abd was notable for acute pancreatitis. Pt was started on IVF and made NPO. [facility] hospitalist consulted for consideration for admission   Assessment and Plan:  # Acute on chronic pancreatitis -Suspect likely triggered by recent outpt EUS -Presenting lipase just under 1,000 with CT findings confirming acute pancreatitis Lipase level 941 >81>>47 trending down Continue IV fluid for hydration 11/21 full liquid diet advance today  Continue symptomatic treatment for pain control and nausea vomiting  # Hypokalemia, potassium repleted. # Hypomagnesemia, mag repleted. # Hypophosphatemia, phosphorus repleted. Monitor electrolytes daily and replete as needed.  # HTN -Cont home BP meds Monitor BP and titrate medications:  # HLD Lipid profile LDL 89, triglyceride 261 elevated Continued Lipitor 40 mg p.o. daily home dose Held fenofibrate  due to acute pancreatitis  # Depression, continued Cymbalta   # Constipation, started laxatives. 11/21 moving bowels, started having diarrhea so discontinued Dulcolax, hold MiraLAX  for 2 days and then resume if needed.  Body mass index is 35.55 kg/m.  Interventions:  Diet: CLD>>FLD DVT Prophylaxis: Subcutaneous Lovenox    Advance goals of care discussion: DNR-limited  Family Communication: family was not present at  bedside, at the time of interview.  The pt provided permission to discuss medical plan with the family. Opportunity was given to ask question and all questions were answered satisfactorily.   Disposition:  Pt is from home, admitted with acute pancreatitis, still has pain and she is on clear liquid diet, which precludes a safe discharge. Discharge to home, when stable, may need few days to improve.  Subjective: No significant events overnight.  Abdominal pain 5/10, patient is moving bowels, had an episode of diarrhea, advised to skip lactic sputum anymore.  Still has low appetite, tolerating clear liquid diet well, plan is to advance her diet to full liquid today.  Overall appetite is still poor.    Physical Exam: General: NAD, lying comfortably Appear in no distress, affect appropriate Eyes: PERRLA ENT: Oral Mucosa Clear, moist  Neck: no JVD,  Cardiovascular: S1 and S2 Present, no Murmur,  Respiratory: good respiratory effort, Bilateral Air entry equal and Decreased, no Crackles, no wheezes Abdomen: BS present, Soft, obese, upper abdominal tenderness  Skin: no rashes Extremities: no Pedal edema, no calf tenderness Neurologic: without any new focal findings Gait not checked due to patient safety concerns  Vitals:   08/18/24 1539 08/18/24 2044 08/19/24 0528 08/19/24 0811  BP: 113/66 135/77 (!) 149/65 (!) 154/56  Pulse: 78 67 71 74  Resp:  16 16 18   Temp: 98.2 F (36.8 C) 98 F (36.7 C) 98.4 F (36.9 C) 97.6 F (36.4 C)  TempSrc:  Oral Oral Oral  SpO2: 98% 92% 96% 96%  Weight:      Height:        Intake/Output Summary (Last 24 hours) at 08/19/2024 1507 Last data filed at  08/19/2024 1226 Gross per 24 hour  Intake 3600.11 ml  Output --  Net 3600.11 ml   Filed Weights   08/17/24 0808  Weight: 103 kg    Data Reviewed: I have personally reviewed and interpreted daily labs, tele strips, imagings as discussed above. I reviewed all nursing notes, pharmacy notes, vitals,  pertinent old records I have discussed plan of care as described above with RN and patient/family.  CBC: Recent Labs  Lab 08/17/24 0901 08/18/24 0726 08/19/24 0350  WBC 8.1 4.6 4.3  NEUTROABS 5.7  --   --   HGB 12.9 12.0 11.5*  HCT 37.3 37.2 35.1*  MCV 89.0 92.8 91.6  PLT 294 237 214   Basic Metabolic Panel: Recent Labs  Lab 08/17/24 0901 08/18/24 0726 08/19/24 0350  NA 133* 139 138  K 3.6 3.4* 3.3*  CL 93* 102 103  CO2 28 27 24   GLUCOSE 140* 98 108*  BUN 9 7* <5*  CREATININE 0.81 0.70 0.53  CALCIUM  8.9 8.3* 8.1*  MG  --  1.5* 1.6*  PHOS  --  3.1 2.1*    Studies: No results found.  Scheduled Meds:  amLODipine   10 mg Oral Daily   aspirin  EC  81 mg Oral Daily   atorvastatin   40 mg Oral Daily   bisacodyl   10 mg Oral QHS   DULoxetine   90 mg Oral QHS   enoxaparin  (LOVENOX ) injection  50 mg Subcutaneous Q24H   gabapentin   300 mg Oral QPM   metoprolol  succinate  25 mg Oral Daily   pantoprazole   40 mg Oral Daily   phosphorus  500 mg Oral TID   polyethylene glycol  17 g Oral BID   Continuous Infusions:  lactated ringers  Stopped (08/19/24 0957)   PRN Meds: acetaminophen  **OR** acetaminophen , ALPRAZolam , bisacodyl , HYDROmorphone  (DILAUDID ) injection, menthol , oxyCODONE , zolpidem   Time spent: 55 minutes  Author: ELVAN SOR. MD [facility] 08/19/2024 3:07 PM  To reach On-call, see care teams to locate the attending and reach out to them via [email or web address]. If 7PM-7AM, please contact night-coverage If you still have difficulty reaching the attending provider, please page the [facility] (Director on Call) for [facility] on amion for assistance.

## 2024-08-19 NOTE — Plan of Care (Signed)
   Problem: Health Behavior/Discharge Planning: Goal: Ability to manage health-related needs will improve Outcome: Progressing

## 2024-08-19 NOTE — Plan of Care (Signed)

## 2024-08-20 DIAGNOSIS — K859 Acute pancreatitis without necrosis or infection, unspecified: Secondary | ICD-10-CM | POA: Diagnosis not present

## 2024-08-20 LAB — CBC
HCT: 35.9 % — ABNORMAL LOW (ref 36.0–46.0)
Hemoglobin: 11.9 g/dL — ABNORMAL LOW (ref 12.0–15.0)
MCH: 30.2 pg (ref 26.0–34.0)
MCHC: 33.1 g/dL (ref 30.0–36.0)
MCV: 91.1 fL (ref 80.0–100.0)
Platelets: 228 K/uL (ref 150–400)
RBC: 3.94 MIL/uL (ref 3.87–5.11)
RDW: 14.6 % (ref 11.5–15.5)
WBC: 5.4 K/uL (ref 4.0–10.5)
nRBC: 0 % (ref 0.0–0.2)

## 2024-08-20 LAB — BASIC METABOLIC PANEL WITH GFR
Anion gap: 12 (ref 5–15)
BUN: <5 mg/dL — ABNORMAL LOW (ref 8–23)
CO2: 29 mmol/L (ref 22–32)
Calcium: 8.1 mg/dL — ABNORMAL LOW (ref 8.9–10.3)
Chloride: 98 mmol/L (ref 98–111)
Creatinine, Ser: 0.5 mg/dL (ref 0.44–1.00)
GFR, Estimated: >60 mL/min (ref >60)
Glucose, Bld: 158 mg/dL — ABNORMAL HIGH (ref 70–99)
Potassium: 3.3 mmol/L — ABNORMAL LOW (ref 3.5–5.1)
Sodium: 139 mmol/L (ref 135–145)

## 2024-08-20 LAB — LIPASE, BLOOD: Lipase: 41 U/L (ref 11–51)

## 2024-08-20 LAB — PHOSPHORUS: Phosphorus: 4 mg/dL (ref 2.5–4.6)

## 2024-08-20 LAB — HEPATIC FUNCTION PANEL
ALT: 36 U/L (ref 0–44)
AST: 52 U/L — ABNORMAL HIGH (ref 15–41)
Albumin: 3.4 g/dL — ABNORMAL LOW (ref 3.5–5.0)
Alkaline Phosphatase: 85 U/L (ref 38–126)
Bilirubin, Direct: 0.2 mg/dL (ref 0.0–0.2)
Indirect Bilirubin: 0.2 mg/dL — ABNORMAL LOW (ref 0.3–0.9)
Total Bilirubin: 0.4 mg/dL (ref 0.0–1.2)
Total Protein: 5.8 g/dL — ABNORMAL LOW (ref 6.5–8.1)

## 2024-08-20 LAB — MAGNESIUM: Magnesium: 1.7 mg/dL (ref 1.7–2.4)

## 2024-08-20 MED ORDER — MAGNESIUM SULFATE 2 GM/50ML IV SOLN
2.0000 g | Freq: Once | INTRAVENOUS | Status: AC
  Administered 2024-08-20: 2 g via INTRAVENOUS
  Filled 2024-08-20: qty 50

## 2024-08-20 MED ORDER — POTASSIUM CHLORIDE 20 MEQ PO PACK
40.0000 meq | PACK | Freq: Once | ORAL | Status: AC
  Administered 2024-08-20: 40 meq via ORAL
  Filled 2024-08-20: qty 2

## 2024-08-20 NOTE — Discharge Summary (Signed)
 [facility] Discharge Summary   Patient: Rhonda Espinoza FMW:[id number]  PCP: Samie Frederick, PA-C  Date of admission: 08/17/2024   Date of discharge:  08/20/2024     Discharge Diagnoses:  Principal Problem:   Pancreatitis   Admitted From: Home Disposition:  Home   Recommendations for Outpatient Follow-up:  F/u with PCP in 1 wk Gradually advance diet as per tolerance Follow up LABS/TEST:  Repeat CMP, Mag and Phos in 1-2 weeks   Diet recommendation: Cardiac diet  Activity: The patient is advised to gradually reintroduce usual activities, as tolerated  Discharge Condition: stable  Code Status: Full code   History of present illness: As per the H and P dictated on admission.  Hospital Course:  Rhonda Espinoza is a 63 y.o. female with medical history significant of HTN, HLD, chronic pancreatitis, anxiety who presented to ED with complaints os 2 wks hx of n/v/abd pain. Pt reports sx started almost immediately after outpt endoscopy which included, colon, egd, and EUS. Symptoms gradually worsened over the following 2 weeks, prompting ED visit   In the ED, lipase was elevated at 941. CT abd was notable for acute pancreatitis. Pt was started on IVF and made NPO. [facility] consulted for consideration for admission     Assessment and Plan:   # Acute on chronic pancreatitis -Suspect likely triggered by recent outpt EUS -Presenting lipase just under 1,000 with CT findings confirming acute pancreatitis. Lipase level 941 >81>>47 trended down S/p IV fluid for hydration, gradually diet was advanced.  Patient tolerated well, without any nausea vomiting.  Pain is well-controlled.  Patient would like to go home.  Advised to gradually advance diet as per tolerance.   # Hypokalemia, potassium repleted. # Hypomagnesemia, mag repleted.  Resolved # Hypophosphatemia, phosphorus repleted.  Resolved   # HTN: Cont home BP meds. Monitor BP at home and follow with PCP to titrate medication  according    # HLD: Lipid profile LDL 89, triglyceride 261 elevated Continued Lipitor 40 mg p.o. daily home dose Held fenofibrate  due to acute pancreatitis.  Patient was advised to continue to hold for 1 more week and then resume once feeling better.   # Depression, continued Cymbalta    # Constipation, started laxatives. 11/21 moving bowels, started having diarrhea so discontinued Dulcolax, hold MiraLAX  for 2 days and then resume if needed.   Body mass index is 35.55 kg/m.  Nutrition Interventions:   Patient was ambulatory without any assistance.  On the day of the discharge the patient's vitals were stable, and no other acute medical condition were reported by patient. the patient was felt safe to be discharge at Home.   Consultants: None Procedures: None  Discharge Exam: General: Appear in no distress, Oral Mucosa Clear, moist. Cardiovascular: S1 and S2 Present, no Murmur, Respiratory: normal respiratory effort, Bilateral Air entry present and no Crackles, no wheezes Abdomen: Bowel Sound present, Soft and no tenderness. Extremities: no Pedal edema, no calf tenderness Neurology: alert and oriented to time, place, and person affect appropriate.  Filed Weights   08/17/24 0808  Weight: 103 kg   Vitals:   08/20/24 0437 08/20/24 0821  BP: 132/82 (!) 145/69  Pulse: 73 63  Resp: 15 18  Temp: 97.9 F (36.6 C) 98 F (36.7 C)  SpO2: 100% 96%    DISCHARGE MEDICATION: Allergies as of 08/20/2024       Reactions   Latex Other (See Comments)   moreso like bandages- these cause blisters   Wound Dressings  Other (See Comments)   moreso like bandages- these cause blisters   Valsartan -hydrochlorothiazide  Other (See Comments)   Caused pancreatitis         Medication List     PAUSE taking these medications    fenofibrate  160 MG tablet Wait to take this until: August 26, 2024 Take 160 mg by mouth daily.       STOP taking these medications    desvenlafaxine   100 MG 24 hr tablet Commonly known as: PRISTIQ    dicyclomine  20 MG tablet Commonly known as: BENTYL    Eszopiclone 3 MG Tabs   ondansetron  4 MG disintegrating tablet Commonly known as: ZOFRAN -ODT   oxyCODONE  5 MG immediate release tablet Commonly known as: Roxicodone    promethazine  25 MG tablet Commonly known as: PHENERGAN    triamcinolone  cream 0.1 % Commonly known as: KENALOG        TAKE these medications    acetaminophen  500 MG tablet Commonly known as: TYLENOL  Take 1 tablet (500 mg total) by mouth as needed for mild pain (pain score 1-3) or headache.   ALPRAZolam  1 MG tablet Commonly known as: XANAX  Take 1 mg by mouth 3 (three) times daily as needed for sleep or anxiety.   amLODipine  10 MG tablet Commonly known as: NORVASC  Take 1 tablet by mouth daily.   aspirin  EC 81 MG tablet Take 81 mg by mouth in the morning. Swallow whole.   atorvastatin  40 MG tablet Commonly known as: LIPITOR Take 40 mg by mouth daily.   Cholecalciferol 1.25 MG (50000 UT) capsule Take 50,000 Units by mouth once a week. Mondays   DULoxetine  30 MG capsule Commonly known as: CYMBALTA  Take 30 mg by mouth at bedtime.  Take one capsule (60 mg dose) by mouth daily. Take in addition to the 30mg  capsule for total of 90mg  IN THE PM.   DULoxetine  60 MG capsule Commonly known as: CYMBALTA  Take 60 mg by mouth.  Take one capsule (60 mg dose) by mouth daily. Take in addition to the 30mg  capsule for total of 90mg  IN THE PM.   gabapentin  300 MG capsule Commonly known as: NEURONTIN  Take 300 mg by mouth daily.   hyoscyamine 0.375 MG 12 hr tablet Commonly known as: LEVBID Take 0.375 mg by mouth every 12 (twelve) hours as needed for cramping.   metoprolol  succinate 25 MG 24 hr tablet Commonly known as: TOPROL -XL Take 25 mg by mouth daily.   multivitamin with minerals tablet Take 1 tablet by mouth daily.   pantoprazole  40 MG tablet Commonly known as: PROTONIX  Take 40 mg by mouth 2 (two) times  daily.   Teriparatide 620 MCG/2.48ML Sopn INJECT 20 MCG UNDER THE SKIN 1 TIME A DAY. DISCARD PEN 28 DAYS AFTER INITIAL USE   thiamine  100 MG tablet Commonly known as: VITAMIN B1 Take 1 tablet (100 mg total) by mouth daily.   zolpidem  12.5 MG CR tablet Commonly known as: AMBIEN  CR Take 12.5 mg by mouth at bedtime as needed for sleep.       Allergies  Allergen Reactions   Latex Other (See Comments)    moreso like bandages- these cause blisters   Wound Dressings Other (See Comments)    moreso like bandages- these cause blisters   Valsartan -Hydrochlorothiazide  Other (See Comments)    Caused pancreatitis    Discharge Instructions     Call MD for:  difficulty breathing, headache or visual disturbances   Complete by: As directed    Call MD for:  extreme fatigue   Complete by:  As directed    Call MD for:  persistant dizziness or light-headedness   Complete by: As directed    Call MD for:  persistant nausea and vomiting   Complete by: As directed    Call MD for:  severe uncontrolled pain   Complete by: As directed    Call MD for:  temperature >100.4   Complete by: As directed    Diet - low sodium heart healthy   Complete by: As directed    Discharge instructions   Complete by: As directed    F/u with PCP in 1 wk Gradually advance diet as per tolerance Repeat CMP, Mag and Phos in 1-2 weeks   Increase activity slowly   Complete by: As directed        The results of significant diagnostics from this hospitalization (including imaging, microbiology, ancillary and laboratory) are listed below for reference.    Significant Diagnostic Studies: CT ABDOMEN PELVIS W CONTRAST Result Date: 08/17/2024 EXAM: CT ABDOMEN AND PELVIS WITH CONTRAST 08/17/2024 10:41:10 AM TECHNIQUE: CT of the abdomen and pelvis was performed with the administration of 100 mL of iohexol  (OMNIPAQUE ) 300 MG/ML solution. Multiplanar reformatted images are provided for review. Automated exposure control,  iterative reconstruction, and/or weight-based adjustment of the mA/kV was utilized to reduce the radiation dose to as low as reasonably achievable. COMPARISON: CT 06/20/2024 CLINICAL HISTORY: Abdominal pain, acute, nonlocalized; endoscopies 07/28/2024. FINDINGS: LOWER CHEST: Stable bibasilar atelectasis and interstitial thickening. LIVER: Low density hepatic parenchyma consistent with hepatic steatosis. GALLBLADDER AND BILE DUCTS: Post cholecystectomy. No biliary ductal dilatation. SPLEEN: No acute abnormality. PANCREAS: There is haziness within the retroperitoneal fat surrounding the distal pancreas and pancreatic head. No organized fluid collections. No pancreatic duct dilatation. The duodenum appears normal. Several small lymph nodes adjacent to the pancreatic head are similar to prior. Findings are most consistent with acute pancreatitis involving the distal pancreas and head of the pancreas. ADRENAL GLANDS: No acute abnormality. KIDNEYS, URETERS AND BLADDER: No stones in the kidneys or ureters. No hydronephrosis. No perinephric or periureteral stranding. Urinary bladder is unremarkable. GI AND BOWEL: Large hiatal hernia. No evidence of small bowel or colon inflammation or obstruction. Normal appendix. PERITONEUM AND RETROPERITONEUM: No ascites. No free air. VASCULATURE: Aorta is normal in caliber. LYMPH NODES: Several small lymph nodes adjacent to the pancreatic head are similar to prior. No other lymphadenopathy. REPRODUCTIVE ORGANS: Rounded enhancing mass in the uterine fundus is most consistent with benign leiomyoma. BONES AND SOFT TISSUES: No acute osseous abnormality. No focal soft tissue abnormality. IMPRESSION: 1. Findings most consistent with acute pancreatitis involving the distal pancreas and head of the pancreas. No organized collections. 2. Large hiatal hernia. 3. Hepatic steatosis. 4. Rounded enhancing mass in the uterine fundus, most consistent with benign leiomyoma. Electronically signed by: Norleen Boxer MD 08/17/2024 11:30 AM EST RP Workstation: [id number]    Microbiology: No results found for this or any previous visit (from the past 240 hours).   Labs: CBC: Recent Labs  Lab 08/17/24 0901 08/18/24 0726 08/19/24 0350 08/20/24 0402  WBC 8.1 4.6 4.3 5.4  NEUTROABS 5.7  --   --   --   HGB 12.9 12.0 11.5* 11.9*  HCT 37.3 37.2 35.1* 35.9*  MCV 89.0 92.8 91.6 91.1  PLT 294 237 214 228   Basic Metabolic Panel: Recent Labs  Lab 08/17/24 0901 08/18/24 0726 08/19/24 0350 08/20/24 0402  NA 133* 139 138 139  K 3.6 3.4* 3.3* 3.3*  CL 93* 102 103 98  CO2 28 27 24 29   GLUCOSE 140* 98 108* 158*  BUN 9 7* <5* <5*  CREATININE 0.81 0.70 0.53 0.50  CALCIUM  8.9 8.3* 8.1* 8.1*  MG  --  1.5* 1.6* 1.7  PHOS  --  3.1 2.1* 4.0   Liver Function Tests: Recent Labs  Lab 08/17/24 0901 08/18/24 0726 08/19/24 0350 08/20/24 0402  AST 34 77* 50* 52*  ALT 32 38 33 36  ALKPHOS 101 90 87 85  BILITOT 0.7 0.6 0.5 0.4  PROT 6.9 6.1* 5.9* 5.8*  ALBUMIN 4.1 3.6 3.5 3.4*   Recent Labs  Lab 08/17/24 0901 08/18/24 0726 08/19/24 0350 08/20/24 0402  LIPASE 941* 81* 47 41   No results for input(s): AMMONIA in the last 168 hours. Cardiac Enzymes: No results for input(s): CKTOTAL, CKMB, CKMBINDEX, TROPONINI in the last 168 hours. BNP (last 3 results) No results for input(s): BNP in the last 8760 hours. CBG: No results for input(s): GLUCAP in the last 168 hours.  Time spent: 35 minutes  Signed:  Elvan Sor  [facility]  08/20/2024 11:49 AM

## 2024-09-18 ENCOUNTER — Other Ambulatory Visit: Payer: Self-pay

## 2024-09-18 ENCOUNTER — Emergency Department
Admission: EM | Admit: 2024-09-18 | Discharge: 2024-09-18 | Disposition: A | Discharge status: Home / Self Care | Source: Home / Self Care

## 2024-09-18 DIAGNOSIS — K5900 Constipation, unspecified: Secondary | ICD-10-CM | POA: Diagnosis not present

## 2024-09-18 DIAGNOSIS — K859 Acute pancreatitis without necrosis or infection, unspecified: Secondary | ICD-10-CM | POA: Diagnosis not present

## 2024-09-18 DIAGNOSIS — K853 Drug induced acute pancreatitis without necrosis or infection: Secondary | ICD-10-CM | POA: Insufficient documentation

## 2024-09-18 DIAGNOSIS — F32A Depression, unspecified: Secondary | ICD-10-CM | POA: Diagnosis not present

## 2024-09-18 DIAGNOSIS — I10 Essential (primary) hypertension: Secondary | ICD-10-CM | POA: Diagnosis not present

## 2024-09-18 DIAGNOSIS — R109 Unspecified abdominal pain: Secondary | ICD-10-CM | POA: Diagnosis present

## 2024-09-18 DIAGNOSIS — R55 Syncope and collapse: Secondary | ICD-10-CM | POA: Diagnosis not present

## 2024-09-18 LAB — CBC
HCT: 34.8 % — ABNORMAL LOW (ref 36.0–46.0)
Hemoglobin: 11.5 g/dL — ABNORMAL LOW (ref 12.0–15.0)
MCH: 29.9 pg (ref 26.0–34.0)
MCHC: 33 g/dL (ref 30.0–36.0)
MCV: 90.4 fL (ref 80.0–100.0)
Platelets: 227 K/uL (ref 150–400)
RBC: 3.85 MIL/uL — ABNORMAL LOW (ref 3.87–5.11)
RDW: 16.7 % — ABNORMAL HIGH (ref 11.5–15.5)
WBC: 6.3 K/uL (ref 4.0–10.5)
nRBC: 0 % (ref 0.0–0.2)

## 2024-09-18 LAB — COMPREHENSIVE METABOLIC PANEL WITH GFR
ALT: 25 U/L (ref 0–44)
AST: 30 U/L (ref 15–41)
Albumin: 4.1 g/dL (ref 3.5–5.0)
Alkaline Phosphatase: 99 U/L (ref 38–126)
Anion gap: 14 (ref 5–15)
BUN: 6 mg/dL — ABNORMAL LOW (ref 8–23)
CO2: 25 mmol/L (ref 22–32)
Calcium: 8.8 mg/dL — ABNORMAL LOW (ref 8.9–10.3)
Chloride: 100 mmol/L (ref 98–111)
Creatinine, Ser: 0.54 mg/dL (ref 0.44–1.00)
GFR, Estimated: >60 mL/min
Glucose, Bld: 126 mg/dL — ABNORMAL HIGH (ref 70–99)
Potassium: 4 mmol/L (ref 3.5–5.1)
Sodium: 139 mmol/L (ref 135–145)
Total Bilirubin: 0.5 mg/dL (ref 0.0–1.2)
Total Protein: 6.9 g/dL (ref 6.5–8.1)

## 2024-09-18 LAB — LIPASE, BLOOD: Lipase: 112 U/L — ABNORMAL HIGH (ref 11–51)

## 2024-09-18 MED ORDER — SODIUM CHLORIDE 0.9 % IV BOLUS
1000.0000 mL | Freq: Once | INTRAVENOUS | Status: AC
  Administered 2024-09-18: 1000 mL via INTRAVENOUS

## 2024-09-18 MED ORDER — OXYCODONE HCL 5 MG PO TABS: 5.0000 mg | ORAL_TABLET | ORAL | 0 refills | Status: AC | PRN

## 2024-09-18 MED ORDER — HYDROMORPHONE HCL 1 MG/ML IJ SOLN: 0.5000 mg | Freq: Once | INTRAMUSCULAR | Status: DC

## 2024-09-18 MED ORDER — ONDANSETRON HCL 4 MG/2ML IJ SOLN: 4.0000 mg | Freq: Once | INTRAMUSCULAR | Status: DC | PRN

## 2024-09-18 MED ORDER — HYDROMORPHONE HCL 1 MG/ML IJ SOLN
0.5000 mg | Freq: Once | INTRAMUSCULAR | Status: AC
  Administered 2024-09-18: 0.5 mg via INTRAVENOUS
  Filled 2024-09-18: qty 0.5

## 2024-09-18 MED ORDER — HYDROMORPHONE HCL 1 MG/ML IJ SOLN
1.0000 mg | Freq: Once | INTRAMUSCULAR | Status: AC
  Administered 2024-09-18: 1 mg via INTRAVENOUS
  Filled 2024-09-18: qty 1

## 2024-09-18 MED ORDER — ONDANSETRON HCL 4 MG/2ML IJ SOLN
4.0000 mg | Freq: Once | INTRAMUSCULAR | Status: AC
  Administered 2024-09-18: 4 mg via INTRAVENOUS
  Filled 2024-09-18: qty 2

## 2024-09-18 NOTE — ED Provider Notes (Signed)
 "  [facility] Provider Note    Event Date/Time   First MD Initiated Contact with Patient 09/18/24 0900     (approximate)   History   Abdominal Pain and Loss of Consciousness   HPI  Rhonda Espinoza is a 63 y.o. female with history of chronic pancreatitis presenting today with concern of abdominal pain nausea vomiting poor p.o. intake over the last 2 days.  Discharged here about 1 month prior, I reviewed her discharge summary at that time it was suspected that this is likely secondary to her chronic pancreatitis, at that time lipase of about 1000 which improved in response to fluids and pain management.  Seems over the last 2 days again recurring abdominal pain nausea vomiting and poor p.o. intake.  She currently complains primarily of epigastric pain.  She tells me that she also has not been able to pass gas for about 1 day.  Feels similar to her pancreatitis history.     Physical Exam   Triage Vital Signs: ED Triage Vitals  Encounter Vitals Group     BP 09/18/24 0751 (!) 165/102     Girls Systolic BP Percentile --      Girls Diastolic BP Percentile --      Boys Systolic BP Percentile --      Boys Diastolic BP Percentile --      Pulse Rate 09/18/24 0751 86     Resp 09/18/24 0751 18     Temp 09/18/24 0751 97.9 F (36.6 C)     Temp Source 09/18/24 0751 Oral     SpO2 09/18/24 0751 94 %     Weight --      Height --      Head Circumference --      Peak Flow --      Pain Score 09/18/24 0644 8     Pain Loc --      Pain Education --      Exclude from Growth Chart --     Most recent vital signs: Vitals:   09/18/24 1200 09/18/24 1230  BP: 124/73 117/70  Pulse: 80 78  Resp: 16   Temp: (!) 97.4 F (36.3 C)   SpO2: 93% 92%     General: Awake, no distress.  CV:  Good peripheral perfusion.  Resp:  Normal effort.  Abd:  No distention.  Soft with some mild epigastric discomfort to palpation Other:     ED Results / Procedures / Treatments    Labs (all labs ordered are listed, but only abnormal results are displayed) Labs Reviewed  LIPASE, BLOOD - Abnormal; Notable for the following components:      Result Value   Lipase 112 (*)    All other components within normal limits  COMPREHENSIVE METABOLIC PANEL WITH GFR - Abnormal; Notable for the following components:   Glucose, Bld 126 (*)    BUN 6 (*)    Calcium  8.8 (*)    All other components within normal limits  CBC - Abnormal; Notable for the following components:   RBC 3.85 (*)    Hemoglobin 11.5 (*)    HCT 34.8 (*)    RDW 16.7 (*)    All other components within normal limits  URINALYSIS, ROUTINE W REFLEX MICROSCOPIC     EKG     RADIOLOGY   PROCEDURES:  Critical Care performed: No  Procedures   MEDICATIONS ORDERED IN ED: Medications  ondansetron  (ZOFRAN ) injection 4 mg (has no administration in time range)  sodium chloride  0.9 % bolus 1,000 mL (0 mLs Intravenous Stopped 09/18/24 1302)  ondansetron  (ZOFRAN ) injection 4 mg (4 mg Intravenous Given 09/18/24 0933)  HYDROmorphone  (DILAUDID ) injection 1 mg (1 mg Intravenous Given 09/18/24 0935)  HYDROmorphone  (DILAUDID ) injection 0.5 mg (0.5 mg Intravenous Given 09/18/24 1157)     IMPRESSION / MDM / ASSESSMENT AND PLAN / ED COURSE  I reviewed the triage vital signs and the nursing notes.                               Patient's presentation is most consistent with acute complicated illness / injury requiring diagnostic workup.  63 year old female history of chronic pancreatitis presenting today with concern of abdominal pain primarily in the epigastric area.  Possibly a flareup of her chronic pancreatitis that she was post to follow-up with someone yesterday but apparently they got caught up and emergency surgery so follow-up had to be canceled.  Continues with pain and poor p.o. intake.  Vitals here are reassuring, some mild abdominal discomfort on palpation of the abdomen.  Given the clinical exam at  this time, we will attempt symptomatic management, likely consistent with an acute on chronic pancreatitis episode.  She has had multiple CT scans over the last year, and I believe reasonable to hold off on further imaging for the time being, if pain continues to be poorly controlled, may warrant imaging at that time, but we will reassess after further pain management and symptom control attempts.  If able to control pain may be reasonable for discharge home with outpatient follow-up plan.   Clinical Course as of 09/18/24 1537  Sun Sep 18, 2024  1037 Patient appears more comfortable than prior, awaiting lab work at this time and will determine safe disposition accordingly. [SK]  1149 Patient's pain is better controlled at this time, lipase is slightly elevated, however given his symptomatology I do suspect likely chronic pancreatitis resulting in her symptoms.  She does follow-up with her primary doctor tomorrow.  Will give a brief course of pain medication for home and believe can be discharged.  I discussed return precautions, patient verbalized understanding and is agreeable with the plan. [SK]    Clinical Course User Index [SK] Fernand Rossie HERO, MD     FINAL CLINICAL IMPRESSION(S) / ED DIAGNOSES   Final diagnoses:  Drug-induced chronic pancreatitis (HCC)     Rx / DC Orders   ED Discharge Orders          Ordered    oxyCODONE  (ROXICODONE ) 5 MG immediate release tablet  Every 4 hours PRN        09/18/24 1150             Note:  This document was prepared using Dragon voice recognition software and may include unintentional dictation errors.   Fernand Rossie HERO, MD 09/18/24 1537  "

## 2024-09-18 NOTE — ED Triage Notes (Signed)
 Pt BIB [facility] from home. Was brought in d/t concern for abdominal pain with NVD and Poor po intake x 3 days Has a syncopal episode at 3 am. Was able to catch herself on the way down. No injuries from fall. Hx pancreatitis.   EMS VS 157/90, HR 94 NSR, 96% RA  IV 22G L forearm. 100 mcg fent.

## 2024-09-18 NOTE — Discharge Instructions (Signed)
 You were seen today due to concern of abdominal pain.  I suspect your symptoms are likely from your pancreatitis, I have written for some pain medication for you to take, please take these as instructed.  If you have any worsening of symptoms such as increased abdominal pain, uncontrolled nausea, vomiting, or any other symptoms you find concerning please return to the emergency department immediately for further medical management.  You should otherwise follow-up with your primary doctor and your pancreatic specialist in the outpatient setting.  While taking the pain medications please avoid driving or operating any heavy machinery as these can make you drowsy.

## 2024-09-19 ENCOUNTER — Observation Stay
Admission: EM | Admit: 2024-09-19 | Discharge: 2024-09-21 | Disposition: A | Discharge status: Home / Self Care | Attending: Internal Medicine | Admitting: Internal Medicine

## 2024-09-19 DIAGNOSIS — K861 Other chronic pancreatitis: Principal | ICD-10-CM

## 2024-09-19 DIAGNOSIS — F419 Anxiety disorder, unspecified: Secondary | ICD-10-CM | POA: Diagnosis present

## 2024-09-19 DIAGNOSIS — R109 Unspecified abdominal pain: Secondary | ICD-10-CM | POA: Diagnosis present

## 2024-09-19 DIAGNOSIS — F32A Depression, unspecified: Secondary | ICD-10-CM | POA: Insufficient documentation

## 2024-09-19 DIAGNOSIS — K449 Diaphragmatic hernia without obstruction or gangrene: Secondary | ICD-10-CM

## 2024-09-19 DIAGNOSIS — R1084 Generalized abdominal pain: Secondary | ICD-10-CM

## 2024-09-19 DIAGNOSIS — K5900 Constipation, unspecified: Secondary | ICD-10-CM | POA: Insufficient documentation

## 2024-09-19 DIAGNOSIS — E669 Obesity, unspecified: Secondary | ICD-10-CM | POA: Diagnosis present

## 2024-09-19 DIAGNOSIS — K862 Cyst of pancreas: Secondary | ICD-10-CM

## 2024-09-19 DIAGNOSIS — I10 Essential (primary) hypertension: Secondary | ICD-10-CM | POA: Insufficient documentation

## 2024-09-19 DIAGNOSIS — R55 Syncope and collapse: Secondary | ICD-10-CM | POA: Insufficient documentation

## 2024-09-19 DIAGNOSIS — K859 Acute pancreatitis without necrosis or infection, unspecified: Principal | ICD-10-CM | POA: Insufficient documentation

## 2024-09-19 LAB — COMPREHENSIVE METABOLIC PANEL WITH GFR
ALT: 27 U/L (ref 0–44)
AST: 58 U/L — ABNORMAL HIGH (ref 15–41)
Albumin: 3.9 g/dL (ref 3.5–5.0)
Alkaline Phosphatase: 95 U/L (ref 38–126)
Anion gap: 11 (ref 5–15)
BUN: 8 mg/dL (ref 8–23)
CO2: 26 mmol/L (ref 22–32)
Calcium: 9 mg/dL (ref 8.9–10.3)
Chloride: 102 mmol/L (ref 98–111)
Creatinine, Ser: 0.57 mg/dL (ref 0.44–1.00)
GFR, Estimated: >60 mL/min
Glucose, Bld: 115 mg/dL — ABNORMAL HIGH (ref 70–99)
Potassium: 4 mmol/L (ref 3.5–5.1)
Sodium: 139 mmol/L (ref 135–145)
Total Bilirubin: 0.4 mg/dL (ref 0.0–1.2)
Total Protein: 6.6 g/dL (ref 6.5–8.1)

## 2024-09-19 LAB — CBC WITH DIFFERENTIAL/PLATELET
Abs Immature Granulocytes: 0.03 K/uL (ref 0.00–0.07)
Basophils Absolute: 0 K/uL (ref 0.0–0.1)
Basophils Relative: 1 %
Eosinophils Absolute: 0.1 K/uL (ref 0.0–0.5)
Eosinophils Relative: 2 %
HCT: 33.9 % — ABNORMAL LOW (ref 36.0–46.0)
Hemoglobin: 11.1 g/dL — ABNORMAL LOW (ref 12.0–15.0)
Immature Granulocytes: 0 %
Lymphocytes Relative: 16 %
Lymphs Abs: 1.1 K/uL (ref 0.7–4.0)
MCH: 30.1 pg (ref 26.0–34.0)
MCHC: 32.7 g/dL (ref 30.0–36.0)
MCV: 91.9 fL (ref 80.0–100.0)
Monocytes Absolute: 0.5 K/uL (ref 0.1–1.0)
Monocytes Relative: 8 %
Neutro Abs: 5.4 K/uL (ref 1.7–7.7)
Neutrophils Relative %: 73 %
Platelets: 224 K/uL (ref 150–400)
RBC: 3.69 MIL/uL — ABNORMAL LOW (ref 3.87–5.11)
RDW: 16.9 % — ABNORMAL HIGH (ref 11.5–15.5)
WBC: 7.2 K/uL (ref 4.0–10.5)
nRBC: 0 % (ref 0.0–0.2)

## 2024-09-19 LAB — URINALYSIS, ROUTINE W REFLEX MICROSCOPIC
Bilirubin Urine: NEGATIVE
Glucose, UA: NEGATIVE mg/dL
Hgb urine dipstick: NEGATIVE
Ketones, ur: NEGATIVE mg/dL
Nitrite: NEGATIVE
Protein, ur: NEGATIVE mg/dL
Specific Gravity, Urine: 1.006 (ref 1.005–1.030)
pH: 7 (ref 5.0–8.0)

## 2024-09-19 LAB — LIPASE, BLOOD: Lipase: 98 U/L — ABNORMAL HIGH (ref 11–51)

## 2024-09-19 MED ORDER — LACTATED RINGERS IV SOLN: INTRAVENOUS | Status: DC

## 2024-09-19 MED ORDER — MORPHINE SULFATE (PF) 4 MG/ML IV SOLN
4.0000 mg | Freq: Once | INTRAVENOUS | Status: AC
  Administered 2024-09-19: 4 mg via INTRAVENOUS
  Filled 2024-09-19: qty 1

## 2024-09-19 MED ORDER — DULOXETINE HCL 30 MG PO CPEP
60.0000 mg | ORAL_CAPSULE | Freq: Every day | ORAL | Status: DC
  Administered 2024-09-19 – 2024-09-21 (×3): 60 mg via ORAL
  Filled 2024-09-19 (×3): qty 2

## 2024-09-19 MED ORDER — ACETAMINOPHEN 500 MG PO TABS: 500.0000 mg | ORAL_TABLET | Freq: Four times a day (QID) | ORAL | Status: DC | PRN

## 2024-09-19 MED ORDER — ASPIRIN 81 MG PO TBEC
81.0000 mg | DELAYED_RELEASE_TABLET | Freq: Every day | ORAL | Status: DC
  Administered 2024-09-19 – 2024-09-21 (×3): 81 mg via ORAL
  Filled 2024-09-19 (×3): qty 1

## 2024-09-19 MED ORDER — VITAMIN D (ERGOCALCIFEROL) 1.25 MG (50000 UNIT) PO CAPS
50000.0000 [IU] | ORAL_CAPSULE | ORAL | Status: DC
  Administered 2024-09-19: 50000 [IU] via ORAL
  Filled 2024-09-19 (×2): qty 1

## 2024-09-19 MED ORDER — MORPHINE SULFATE (PF) 2 MG/ML IV SOLN
2.0000 mg | INTRAVENOUS | Status: DC | PRN
  Filled 2024-09-19: qty 1

## 2024-09-19 MED ORDER — THIAMINE HCL 100 MG PO TABS
100.0000 mg | ORAL_TABLET | Freq: Every day | ORAL | Status: DC
  Administered 2024-09-19 – 2024-09-21 (×3): 100 mg via ORAL
  Filled 2024-09-19 (×6): qty 1

## 2024-09-19 MED ORDER — ENOXAPARIN SODIUM 60 MG/0.6ML IJ SOSY
0.5000 mg/kg | PREFILLED_SYRINGE | INTRAMUSCULAR | Status: DC
  Administered 2024-09-19 – 2024-09-21 (×3): 52.5 mg via SUBCUTANEOUS
  Filled 2024-09-19 (×3): qty 0.6

## 2024-09-19 MED ORDER — ONDANSETRON HCL 4 MG/2ML IJ SOLN
4.0000 mg | Freq: Once | INTRAMUSCULAR | Status: AC
  Administered 2024-09-19: 4 mg via INTRAVENOUS
  Filled 2024-09-19: qty 2

## 2024-09-19 MED ORDER — GABAPENTIN 300 MG PO CAPS
300.0000 mg | ORAL_CAPSULE | Freq: Every day | ORAL | Status: DC
  Administered 2024-09-20 – 2024-09-21 (×2): 300 mg via ORAL
  Filled 2024-09-19 (×2): qty 1

## 2024-09-19 MED ORDER — MELATONIN 5 MG PO TABS: 5.0000 mg | ORAL_TABLET | Freq: Every evening | ORAL | Status: DC | PRN

## 2024-09-19 MED ORDER — AMLODIPINE BESYLATE 10 MG PO TABS
10.0000 mg | ORAL_TABLET | Freq: Every day | ORAL | Status: DC
  Administered 2024-09-19 – 2024-09-21 (×3): 10 mg via ORAL
  Filled 2024-09-19 (×3): qty 1

## 2024-09-19 MED ORDER — ATORVASTATIN CALCIUM 20 MG PO TABS
40.0000 mg | ORAL_TABLET | Freq: Every day | ORAL | Status: DC
  Administered 2024-09-19 – 2024-09-21 (×3): 40 mg via ORAL
  Filled 2024-09-19 (×3): qty 2

## 2024-09-19 MED ORDER — OXYCODONE HCL 5 MG PO TABS: 5.0000 mg | ORAL_TABLET | ORAL | Status: DC | PRN

## 2024-09-19 MED ORDER — HYDROMORPHONE HCL 1 MG/ML IJ SOLN
0.5000 mg | INTRAMUSCULAR | Status: DC | PRN
  Administered 2024-09-19 – 2024-09-21 (×7): 0.5 mg via INTRAVENOUS
  Filled 2024-09-19 (×7): qty 0.5

## 2024-09-19 MED ORDER — LACTATED RINGERS IV BOLUS
1000.0000 mL | Freq: Once | INTRAVENOUS | Status: AC
  Administered 2024-09-19: 1000 mL via INTRAVENOUS

## 2024-09-19 MED ORDER — BOOST / RESOURCE BREEZE PO LIQD CUSTOM
1.0000 | Freq: Three times a day (TID) | ORAL | Status: DC
  Administered 2024-09-19: 1 via ORAL

## 2024-09-19 MED ORDER — PANTOPRAZOLE SODIUM 40 MG PO TBEC
40.0000 mg | DELAYED_RELEASE_TABLET | Freq: Two times a day (BID) | ORAL | Status: DC
  Administered 2024-09-19 – 2024-09-21 (×5): 40 mg via ORAL
  Filled 2024-09-19 (×5): qty 1

## 2024-09-19 MED ORDER — METOPROLOL SUCCINATE ER 25 MG PO TB24
25.0000 mg | ORAL_TABLET | Freq: Every day | ORAL | Status: DC
  Administered 2024-09-19 – 2024-09-21 (×3): 25 mg via ORAL
  Filled 2024-09-19 (×3): qty 1

## 2024-09-19 MED ORDER — ZOLPIDEM TARTRATE 5 MG PO TABS
5.0000 mg | ORAL_TABLET | Freq: Every day | ORAL | Status: DC
  Administered 2024-09-19 – 2024-09-20 (×2): 5 mg via ORAL
  Filled 2024-09-19 (×3): qty 1

## 2024-09-19 MED ORDER — OXYCODONE HCL 5 MG PO TABS
5.0000 mg | ORAL_TABLET | ORAL | Status: DC | PRN
  Administered 2024-09-19 – 2024-09-20 (×2): 10 mg via ORAL
  Filled 2024-09-19 (×2): qty 2

## 2024-09-19 MED ORDER — POLYETHYLENE GLYCOL 3350 17 G PO PACK
17.0000 g | PACK | Freq: Every day | ORAL | Status: DC | PRN
  Administered 2024-09-20: 17 g via ORAL
  Filled 2024-09-19: qty 1

## 2024-09-19 MED ORDER — PROCHLORPERAZINE EDISYLATE 10 MG/2ML IJ SOLN: 5.0000 mg | Freq: Four times a day (QID) | INTRAMUSCULAR | Status: DC | PRN

## 2024-09-19 NOTE — Assessment & Plan Note (Signed)
On MiraLAX °

## 2024-09-19 NOTE — Assessment & Plan Note (Signed)
 Blood pressure likely elevated with pain continue Norvasc  and Toprol .

## 2024-09-19 NOTE — ED Provider Notes (Signed)
 "  [facility] Provider Note    Event Date/Time   First MD Initiated Contact with Patient 09/19/24 [id number]     (approximate)   History   Chief Complaint Abdominal Pain and Loss of Consciousness   HPI  Rhonda Espinoza is a 63 y.o. female with past medical history of hypertension, hyperlipidemia, chronic pancreatitis, and anxiety who presents to the ED complaining of abdominal pain and syncope.  Patient reports that she has been dealing with increasing pain in her epigastric area for the past 24 hours.  This been associated with nausea and is worse when eating, similar to prior flareups of pancreatitis.  She denies any associated fevers, flank pain, dysuria, or diarrhea.  She was seen in the ED for the symptoms yesterday with unremarkable workup.  She then had an episode this evening of dizziness and lightheadedness while brushing her teeth.  She states that she felt like she might pass out, but never fully lost consciousness.  She did not fall or hit her head.     Physical Exam   Triage Vital Signs: ED Triage Vitals  Encounter Vitals Group     BP      Girls Systolic BP Percentile      Girls Diastolic BP Percentile      Boys Systolic BP Percentile      Boys Diastolic BP Percentile      Pulse      Resp      Temp      Temp src      SpO2      Weight      Height      Head Circumference      Peak Flow      Pain Score      Pain Loc      Pain Education      Exclude from Growth Chart     Most recent vital signs: Vitals:   09/19/24 0240 09/19/24 0249  BP:  (!) 170/95  Pulse: 86   Resp: 17   Temp: 98.1 F (36.7 C)   SpO2: 97%     Constitutional: Alert and oriented. Eyes: Conjunctivae are normal. Head: Atraumatic. Nose: No congestion/rhinnorhea. Mouth/Throat: Mucous membranes are moist.  Cardiovascular: Normal rate, regular rhythm. Grossly normal heart sounds.  2+ radial pulses bilaterally. Respiratory: Normal respiratory effort.  No retractions.  Lungs CTAB. Gastrointestinal: Soft and tender to palpation in the epigastrium with no rebound or guarding. No distention. Musculoskeletal: No lower extremity tenderness nor edema.  Neurologic:  Normal speech and language. No gross focal neurologic deficits are appreciated.    ED Results / Procedures / Treatments   Labs (all labs ordered are listed, but only abnormal results are displayed) Labs Reviewed  CBC WITH DIFFERENTIAL/PLATELET - Abnormal; Notable for the following components:      Result Value   RBC 3.69 (*)    Hemoglobin 11.1 (*)    HCT 33.9 (*)    RDW 16.9 (*)    All other components within normal limits  COMPREHENSIVE METABOLIC PANEL WITH GFR - Abnormal; Notable for the following components:   Glucose, Bld 115 (*)    AST 58 (*)    All other components within normal limits  LIPASE, BLOOD - Abnormal; Notable for the following components:   Lipase 98 (*)    All other components within normal limits  URINALYSIS, ROUTINE W REFLEX MICROSCOPIC     EKG  ED ECG REPORT I, Carlin Palin, the attending physician,  personally viewed and interpreted this ECG.   Date: 09/19/2024  EKG Time: 2:42  Rate: 84  Rhythm: normal sinus rhythm  Axis: LAD  Intervals:none  ST&T Change: None  PROCEDURES:  Critical Care performed: No  Procedures   MEDICATIONS ORDERED IN ED: Medications  lactated ringers  bolus 1,000 mL (1,000 mLs Intravenous New Bag/Given 09/19/24 0311)  morphine  (PF) 4 MG/ML injection 4 mg (4 mg Intravenous Given 09/19/24 0311)  ondansetron  (ZOFRAN ) injection 4 mg (4 mg Intravenous Given 09/19/24 0311)     IMPRESSION / MDM / ASSESSMENT AND PLAN / ED COURSE  I reviewed the triage vital signs and the nursing notes.                              63 y.o. female with past medical history of hypertension, hyperlipidemia, chronic pancreatitis, and anxiety who presents to the ED complaining of 24 hours of epigastric discomfort and nausea with decreased oral intake,  had episode of lightheadedness and near syncope prior to arrival.  Patient's presentation is most consistent with acute presentation with potential threat to life or bodily function.  Differential diagnosis includes, but is not limited to, pancreatitis, hepatitis, cholecystitis, biliary colic, GERD, dehydration, anemia, electrolyte abnormality, AKI, vasovagal episode, orthostatic hypotension, arrhythmia.  Patient well-appearing and in no acute distress, vital signs are unremarkable.  Her abdomen is soft with tenderness in the epigastrium similar to prior episodes of chronic pancreatitis.  We will recheck labs including LFTs and lipase, treat symptomatically with IV morphine  and Zofran , hydrate with IV fluids.  EKG shows no evidence of arrhythmia or ischemia and suspect hypovolemia with her decreased oral intake as the cause of near syncope.  Patient prefers to hold off on CT imaging of her abdomen for now, which is reasonable given likely chronic pancreatitis.  Labs show mild elevation in lipase, similar to ED visit yesterday.  Additional results without significant anemia, leukocytosis, electrolyte abnormality, or AKI.  LFTs are unremarkable, urinalysis pending but low suspicion for UTI.  Patient feels slightly better following pain and nausea medicine, but case discussed with hospitalist for admission given flareup of concurrent pancreatitis leading to dehydration and near syncope.      FINAL CLINICAL IMPRESSION(S) / ED DIAGNOSES   Final diagnoses:  Chronic pancreatitis, unspecified pancreatitis type (HCC)  Near syncope     Rx / DC Orders   ED Discharge Orders     None        Note:  This document was prepared using Dragon voice recognition software and may include unintentional dictation errors.   Willo Dunnings, MD 09/19/24 [phone]  "

## 2024-09-19 NOTE — Hospital Course (Signed)
 63 y.o. female with medical history significant for chronic pancreatitis, hypertension, hyperlipidemia, chronic anxiety/depression, LA grade B erosive esophagitis, history of alcohol withdrawal seizure, presents to the ER with complaints of diffuse abdominal pain.  Associated with nausea and vomiting a week ago.  She presented to the ER yesterday with the same symptoms.  She was treated with pain medications and IV fluid, felt better then was sent home.  Denies any subjective fevers or chills.  No shortness of breath or chest pain.  Today, she almost passed out on the commode due to feeling weak from decreased p.o. intake.   In the ER, endorses diffuse abdominal pain.  No nausea at this time.  Lipase elevated 98.  The patient endorses the pain is similar to prior pancreatitis.  EDP requesting admission due to concern for acute on chronic pancreatitis.  The patient declines CT scan at this time, stating she has had multiple CT scans done and would like to avoid exposure to more radiation.   The patient received IV opiate-based analgesics and IV fluid in the ER.  Admitted by [facility], hospitalist service.   ED Course: Temperature 98.3.  BP 159/98, pulse 80, respiratory rate 17.  O2 saturation 98% on room air.  12/22.  Patient has abdominal pain 9 out of 10 in intensity.  States morphine  makes her itch and wanted to antibiotic instead.  Advised to take oral pain medication.  Patient wants to do clear liquid diet.  Hoping to go home soon.  Declined any imaging. 12/23.  Try to advance diet for breakfast and did not go to well with more abdominal pain.  Ate soup for lunch.  Will see how she does with dinner for breakfast.

## 2024-09-19 NOTE — Plan of Care (Signed)

## 2024-09-19 NOTE — Assessment & Plan Note (Signed)
 On Cymbalta 

## 2024-09-19 NOTE — Progress Notes (Signed)
 Anticoagulation monitoring(Lovenox ):  63 yo female ordered Lovenox  40 mg Q24h    Filed Weights   09/19/24 0244  Weight: 106.9 kg (235 lb 11.2 oz)   BMI 36.9    Lab Results  Component Value Date   CREATININE 0.57 09/19/2024   CREATININE 0.54 09/18/2024   CREATININE 0.50 08/20/2024   Estimated Creatinine Clearance: 91.7 mL/min (by C-G formula based on SCr of 0.57 mg/dL). Hemoglobin & Hematocrit     Component Value Date/Time   HGB 11.1 (L) 09/19/2024 0256   HCT 33.9 (L) 09/19/2024 0256     Per Protocol for Patient with estCrcl > 30 ml/min and BMI > 30, will transition to Lovenox  52.5 mg Q24h.

## 2024-09-19 NOTE — Assessment & Plan Note (Signed)
 Patient declined imaging.  IV fluid hydration.  Previously had her gallbladder removed.  Previous triglycerides normal range.  IV fluid hydration.  Clear liquid diet.

## 2024-09-19 NOTE — Progress Notes (Signed)
 " Progress Note   Patient: Rhonda Espinoza FMW:[id number] DOB: [birth year] DOA: 09/19/2024     0 DOS: the patient was seen and examined on 09/19/2024   Brief hospital course: 63 y.o. female with medical history significant for chronic pancreatitis, hypertension, hyperlipidemia, chronic anxiety/depression, LA grade B erosive esophagitis, history of alcohol withdrawal seizure, presents to the ER with complaints of diffuse abdominal pain.  Associated with nausea and vomiting a week ago.  She presented to the ER yesterday with the same symptoms.  She was treated with pain medications and IV fluid, felt better then was sent home.  Denies any subjective fevers or chills.  No shortness of breath or chest pain.  Today, she almost passed out on the commode due to feeling weak from decreased p.o. intake.   In the ER, endorses diffuse abdominal pain.  No nausea at this time.  Lipase elevated 98.  The patient endorses the pain is similar to prior pancreatitis.  EDP requesting admission due to concern for acute on chronic pancreatitis.  The patient declines CT scan at this time, stating she has had multiple CT scans done and would like to avoid exposure to more radiation.   The patient received IV opiate-based analgesics and IV fluid in the ER.  Admitted by [facility], hospitalist service.   ED Course: Temperature 98.3.  BP 159/98, pulse 80, respiratory rate 17.  O2 saturation 98% on room air.  12/22.  Patient has abdominal pain 9 out of 10 in intensity.  States morphine  makes her itch and wanted to antibiotic instead.  Advised to take oral pain medication.  Patient wants to do clear liquid diet.  Hoping to go home soon.  Declined any imaging.  Assessment and Plan: * Acute pancreatitis Patient declined imaging.  IV fluid hydration.  Previously had her gallbladder removed.  Previous triglycerides normal range.  IV fluid hydration.  Clear liquid diet.  Essential hypertension Blood pressure likely elevated with pain  continue Norvasc  and Toprol .  Anxiety and depression On Cymbalta   Obesity (BMI 30-39.9) Class II with a BMI of 38.5  Hiatal hernia On PPI  Constipation On MiraLAX         Subjective: Patient admitted with abdominal pain and acute pancreatitis.  Physical Exam: Vitals:   09/19/24 0249 09/19/24 0500 09/19/24 0545 09/19/24 0822  BP: (!) 170/95 (!) 159/98 (!) 173/83 (!) 176/87  Pulse:  80 83 81  Resp:  17    Temp:  98.3 F (36.8 C) (!) 97.5 F (36.4 C) 97.6 F (36.4 C)  TempSrc:  Oral  Oral  SpO2:  95% 98% 98%  Weight:   111.5 kg   Height:   5' 7 (1.702 m)    Physical Exam HENT:     Head: Normocephalic.  Eyes:     General: Lids are normal.     Conjunctiva/sclera: Conjunctivae normal.  Cardiovascular:     Rate and Rhythm: Normal rate and regular rhythm.     Heart sounds: Normal heart sounds, S1 normal and S2 normal.  Pulmonary:     Breath sounds: No decreased breath sounds, wheezing, rhonchi or rales.  Abdominal:     Palpations: Abdomen is soft.     Tenderness: There is generalized abdominal tenderness.  Musculoskeletal:     Right lower leg: No swelling.     Left lower leg: No swelling.  Skin:    General: Skin is warm.     Findings: No rash.  Neurological:     Mental Status: She is  alert and oriented to person, place, and time.     Data Reviewed: Creatinine 0.57, lipase 98, AST 58, electrolytes normal range, white blood cell count 7.2, hemoglobin 11.1, platelet count 224   Disposition: Status is: Observation Patient hoping to get fluids and improved rapidly and wants to go home soon.  Planned Discharge Destination: Home    Time spent: 28 minutes  Author: Charlie Patterson, MD 09/19/2024 12:14 PM  For on call review [email or web address].  "

## 2024-09-19 NOTE — ED Triage Notes (Signed)
 Pt here via EMS from home for recurrent pancreatitis flare-up pain (hx of alcohol induced pancreatitis) and syncopal episode with fall. Pt appears comfortable, no head strike/blood thinners. Denies N/V/D/fever/CP/SOB. No meds PTA, unable to pick up meds from pharmacy. Seen yesterday for same issue.

## 2024-09-19 NOTE — Assessment & Plan Note (Signed)
 On PPI

## 2024-09-19 NOTE — TOC CM/SW Note (Signed)
 Transition of Care [facility]) - Inpatient Brief Assessment   Patient Details  Name: LAUREE YURICK MRN: [id number] Date of Birth: [birth year]  Transition of Care [facility] CM/SW Contact:    Daved JONETTA Hamilton, RN Phone Number: 09/19/2024, 9:25 AM   Clinical Narrative:   Transition of Care (TOC) Screening Note   Patient Details  Name: JETAUN COLBATH Date of Birth: 1960-10-09   Transition of Care [facility]) CM/SW Contact:    Daved JONETTA Hamilton, RN Phone Number: 09/19/2024, 9:25 AM    Transition of Care Department [facility]) has reviewed patient and no TOC needs have been identified at this time. If new patient transition needs arise, please place a TOC consult.    Transition of Care Asessment: Insurance and Status: Insurance coverage has been reviewed Patient has primary care physician: Yes   Prior level of function:: Independent Prior/Current Home Services: No current home services Social Drivers of Health Review: SDOH reviewed no interventions necessary Readmission risk has been reviewed: No (Patient is in observation status, no score generated) Transition of care needs: no transition of care needs at this time

## 2024-09-19 NOTE — H&P (Addendum)
 " History and Physical  Rhonda Espinoza FMW:[id number] DOB: 07/08/1961 DOA: 09/19/2024  Referring physician: Dr. Willo, EDP  PCP: Samie Frederick, PA-C  Outpatient Specialists: Endocrinology, GI, general surgery. Patient coming from: Home.  Chief Complaint: Generalized abdominal pain.  HPI: Rhonda Espinoza is a 63 y.o. female with medical history significant for chronic pancreatitis, hypertension, hyperlipidemia, chronic anxiety/depression, LA grade B erosive esophagitis, history of alcohol withdrawal seizure, presents to the ER with complaints of diffuse abdominal pain.  Associated with nausea and vomiting a week ago.  She presented to the ER yesterday with the same symptoms.  She was treated with pain medications and IV fluid, felt better then was sent home.  Denies any subjective fevers or chills.  No shortness of breath or chest pain.  Today, she almost passed out on the commode due to feeling weak from decreased p.o. intake.  In the ER, endorses diffuse abdominal pain.  No nausea at this time.  Lipase elevated 98.  The patient endorses the pain is similar to prior pancreatitis.  EDP requesting admission due to concern for acute on chronic pancreatitis.  The patient declines CT scan at this time, stating she has had multiple CT scans done and would like to avoid exposure to more radiation.  The patient received IV opiate-based analgesics and IV fluid in the ER.  Admitted by [facility], hospitalist service.  ED Course: Temperature 98.3.  BP 159/98, pulse 80, respiratory rate 17.  O2 saturation 98% on room air.  Review of Systems: Review of systems as noted in the HPI. All other systems reviewed and are negative.   Past Medical History:  Diagnosis Date   ADD (attention deficit disorder)    Alcohol withdrawal seizure (HCC)    Alcohol-induced pancreatitis    Anxiety    Arthritis    Asthma    allergy induced per patient   Disc disorder    Hyperlipidemia    Hypertension    Pancreatitis    Pneumonia     Past Surgical History:  Procedure Laterality Date   CESAREAN SECTION     CHOLECYSTECTOMY  2022   FOOT SURGERY Left    FRACTURE SURGERY Right 07/2020   R wrist    HERNIA REPAIR     KNEE ARTHROSCOPY     KYPHOPLASTY N/A 10/13/2018   Procedure: THORACIC 12 KYPHOPLASTY;  Surgeon: Beuford Anes, MD;  Location: [facility] OR;  Service: Orthopedics;  Laterality: N/A;   KYPHOPLASTY N/A 02/05/2021   Procedure: LUMBAR ONE  AND LUMBAR FOUR KYPHOPLASTY;  Surgeon: Beuford Anes, MD;  Location: [facility] OR;  Service: Orthopedics;  Laterality: N/A;    Social History:  reports that she has never smoked. She has never used smokeless tobacco. She reports current alcohol use of about 4.0 standard drinks of alcohol per week. She reports that she does not use drugs.   Allergies[1]  Family History  Problem Relation Age of Onset   Pancreatitis Brother    Seizures Neg Hx       Prior to Admission medications  Medication Sig Start Date End Date Taking? Authorizing Provider  acetaminophen  (TYLENOL ) 500 MG tablet Take 1 tablet (500 mg total) by mouth as needed for mild pain (pain score 1-3) or headache. Patient not taking: Reported on 08/17/2024 05/13/24   Samtani, Jai-Gurmukh, MD  ALPRAZolam  (XANAX ) 1 MG tablet Take 1 mg by mouth 3 (three) times daily as needed for sleep or anxiety. 05/23/21   [provider]  amLODipine  (NORVASC ) 10 MG tablet Take 1  tablet by mouth daily. 05/06/23   [provider]  aspirin  EC 81 MG tablet Take 81 mg by mouth in the morning. Swallow whole.    [provider]  atorvastatin  (LIPITOR) 40 MG tablet Take 40 mg by mouth daily.  06/12/16   [provider]  Cholecalciferol 1.25 MG (50000 UT) capsule Take 50,000 Units by mouth once a week. Mondays 07/28/22   [provider]  DULoxetine  (CYMBALTA ) 30 MG capsule Take 30 mg by mouth at bedtime.  Take one capsule (60 mg dose) by mouth daily. Take in addition to the 30mg  capsule for total of 90mg  IN THE  PM. 06/01/24   [provider]  DULoxetine  (CYMBALTA ) 60 MG capsule Take 60 mg by mouth.  Take one capsule (60 mg dose) by mouth daily. Take in addition to the 30mg  capsule for total of 90mg  IN THE PM. 08/15/24   [provider]  fenofibrate  160 MG tablet Take 160 mg by mouth daily.    [provider]  gabapentin  (NEURONTIN ) 300 MG capsule Take 300 mg by mouth daily. 05/10/24   [provider]  hyoscyamine (LEVBID) 0.375 MG 12 hr tablet Take 0.375 mg by mouth every 12 (twelve) hours as needed for cramping. 11/04/23   [provider]  metoprolol  succinate (TOPROL -XL) 25 MG 24 hr tablet Take 25 mg by mouth daily. 12/28/23   [provider]  Multiple Vitamins-Minerals (MULTIVITAMIN WITH MINERALS) tablet Take 1 tablet by mouth daily.    [provider]  oxyCODONE  (ROXICODONE ) 5 MG immediate release tablet Take 1 tablet (5 mg total) by mouth every 4 (four) hours as needed for severe pain (pain score 7-10). 09/18/24   Fernand Rossie HERO, MD  pantoprazole  (PROTONIX ) 40 MG tablet Take 40 mg by mouth 2 (two) times daily.    [provider]  Teriparatide 620 MCG/2.48ML SOPN INJECT 20 MCG UNDER THE SKIN 1 TIME A DAY. DISCARD PEN 28 DAYS AFTER INITIAL USE 03/12/23   [provider]  thiamine  (VITAMIN B1) 100 MG tablet Take 1 tablet (100 mg total) by mouth daily. 12/23/23   Fausto Sor A, DO  zolpidem  (AMBIEN  CR) 12.5 MG CR tablet Take 12.5 mg by mouth at bedtime as needed for sleep. 07/29/24   [provider]    Physical Exam: BP (!) 173/83 (BP Location: Left Arm)   Pulse 83   Temp (!) 97.5 F (36.4 C)   Resp 17   Ht 5' 7 (1.702 m)   Wt 106.9 kg   LMP 06/30/2011   SpO2 98%   BMI 36.92 kg/m   General: 63 y.o. year-old female well developed well nourished in no acute distress.  Alert and oriented x3. Cardiovascular: Regular rate and rhythm with no rubs or gallops.  No thyromegaly or JVD noted.  No lower extremity edema.  2/4 pulses in all 4 extremities. Respiratory: Clear to auscultation with no wheezes or rales. Good inspiratory effort. Abdomen: Obese, diffusely tender with moderate palpation.  Nondistended with normal bowel sounds x4 quadrants. Muskuloskeletal: No cyanosis, clubbing or edema noted bilaterally Neuro: CN II-XII intact, strength, sensation, reflexes Skin: No ulcerative lesions noted or rashes Psychiatry: Judgement and insight appear normal. Mood is appropriate for condition and setting          Labs on Admission:  Basic Metabolic Panel: Recent Labs  Lab 09/18/24 1100 09/19/24 0256  NA 139 139  K 4.0 4.0  CL 100 102  CO2 25 26  GLUCOSE 126* 115*  BUN 6* 8  CREATININE 0.54 0.57  CALCIUM  8.8* 9.0   Liver Function Tests: Recent Labs  Lab 09/18/24 1100 09/19/24 0256  AST 30 58*  ALT 25 27  ALKPHOS 99 95  BILITOT 0.5 0.4  PROT 6.9 6.6  ALBUMIN 4.1 3.9   Recent Labs  Lab 09/18/24 1100 09/19/24 0256  LIPASE 112* 98*   No results for input(s): AMMONIA in the last 168 hours. CBC: Recent Labs  Lab 09/18/24 1100 09/19/24 0256  WBC 6.3 7.2  NEUTROABS  --  5.4  HGB 11.5* 11.1*  HCT 34.8* 33.9*  MCV 90.4 91.9  PLT 227 224   Cardiac Enzymes: No results for input(s): CKTOTAL, CKMB, CKMBINDEX, TROPONINI in the last 168 hours.  BNP (last 3 results) No results for input(s): BNP in the last 8760 hours.  ProBNP (last 3 results) No results for input(s): PROBNP in the last 8760 hours.  CBG: No results for input(s): GLUCAP in the last 168 hours.  Radiological Exams on Admission: No results found.  EKG: I independently viewed the EKG done and my findings are as followed: Sinus rhythm rate of 84.  QTc 467.  Assessment/Plan Present on Admission:  Abdominal pain  Principal Problem:   Abdominal pain  Diffuse abdominal pain, unclear etiology Lipase 98, monitor for now Continue pain control Continue IV fluid The patient has declined CT  scan Requested soup and liquids. Started clear liquid diet, advance as tolerated  Obesity BMI 38 Recommend weight loss outpatient with regular physical activity and healthy dieting.  Hypertension BP is not at goal, elevated Resume home oral antihypertensives Closely monitor vital signs.  Chronic anxiety/depression Resume home regimen  Chronic constipation Bowel regimen in place with as needed MiraLAX .  LA grade B erosive esophagitis Resume home PPI twice daily   Time: 75 minutes.   DVT prophylaxis: Subcu Lovenox  daily.  Code Status: DNR.  Family Communication: None at bedside.  Disposition Plan: Admitted to MedSurg unit.  Consults called: None.  Admission status: Observation status.   Status is: Observation    Terry LOISE Hurst MD [facility] Pager [phone]  If 7PM-7AM, please contact night-coverage [email or web address] Password [facility]  09/19/2024, 6:28 AM      [1]  Allergies Allergen Reactions   Latex Other (See Comments)    moreso like bandages- these cause blisters   Wound Dressings Other (See Comments)    moreso like bandages- these cause blisters   Valsartan -Hydrochlorothiazide  Other (See Comments)    Caused pancreatitis    "

## 2024-09-19 NOTE — Assessment & Plan Note (Signed)
 Class II with a BMI of 38.5

## 2024-09-20 DIAGNOSIS — F419 Anxiety disorder, unspecified: Secondary | ICD-10-CM | POA: Diagnosis not present

## 2024-09-20 DIAGNOSIS — E669 Obesity, unspecified: Secondary | ICD-10-CM | POA: Diagnosis not present

## 2024-09-20 DIAGNOSIS — I10 Essential (primary) hypertension: Secondary | ICD-10-CM | POA: Diagnosis not present

## 2024-09-20 DIAGNOSIS — K449 Diaphragmatic hernia without obstruction or gangrene: Secondary | ICD-10-CM

## 2024-09-20 DIAGNOSIS — K862 Cyst of pancreas: Secondary | ICD-10-CM | POA: Diagnosis not present

## 2024-09-20 DIAGNOSIS — F32A Depression, unspecified: Secondary | ICD-10-CM

## 2024-09-20 DIAGNOSIS — K5909 Other constipation: Secondary | ICD-10-CM | POA: Diagnosis not present

## 2024-09-20 DIAGNOSIS — K8500 Idiopathic acute pancreatitis without necrosis or infection: Secondary | ICD-10-CM

## 2024-09-20 LAB — BASIC METABOLIC PANEL WITH GFR
Anion gap: 11 (ref 5–15)
BUN: 6 mg/dL — ABNORMAL LOW (ref 8–23)
CO2: 26 mmol/L (ref 22–32)
Calcium: 8.6 mg/dL — ABNORMAL LOW (ref 8.9–10.3)
Chloride: 103 mmol/L (ref 98–111)
Creatinine, Ser: 0.46 mg/dL (ref 0.44–1.00)
GFR, Estimated: >60 mL/min
Glucose, Bld: 101 mg/dL — ABNORMAL HIGH (ref 70–99)
Potassium: 3.9 mmol/L (ref 3.5–5.1)
Sodium: 139 mmol/L (ref 135–145)

## 2024-09-20 LAB — LIPASE, BLOOD: Lipase: 45 U/L (ref 11–51)

## 2024-09-20 MED ORDER — LACTATED RINGERS IV SOLN: INTRAVENOUS | Status: DC

## 2024-09-20 MED ORDER — ENSURE PLUS HIGH PROTEIN PO LIQD: 237.0000 mL | Freq: Two times a day (BID) | ORAL | Status: DC

## 2024-09-20 NOTE — Assessment & Plan Note (Signed)
 Seen on imaging back in 2023 on MRI.  Declined any further imaging on this hospitalization.

## 2024-09-20 NOTE — Plan of Care (Signed)

## 2024-09-20 NOTE — Progress Notes (Signed)
 " Progress Note   Patient: Rhonda Espinoza FMW:[id number] DOB: 1961-05-29 DOA: 09/19/2024     0 DOS: the patient was seen and examined on 09/20/2024   Brief hospital course: 63 y.o. female with medical history significant for chronic pancreatitis, hypertension, hyperlipidemia, chronic anxiety/depression, LA grade B erosive esophagitis, history of alcohol withdrawal seizure, presents to the ER with complaints of diffuse abdominal pain.  Associated with nausea and vomiting a week ago.  She presented to the ER yesterday with the same symptoms.  She was treated with pain medications and IV fluid, felt better then was sent home.  Denies any subjective fevers or chills.  No shortness of breath or chest pain.  Today, she almost passed out on the commode due to feeling weak from decreased p.o. intake.   In the ER, endorses diffuse abdominal pain.  No nausea at this time.  Lipase elevated 98.  The patient endorses the pain is similar to prior pancreatitis.  EDP requesting admission due to concern for acute on chronic pancreatitis.  The patient declines CT scan at this time, stating she has had multiple CT scans done and would like to avoid exposure to more radiation.   The patient received IV opiate-based analgesics and IV fluid in the ER.  Admitted by [facility], hospitalist service.   ED Course: Temperature 98.3.  BP 159/98, pulse 80, respiratory rate 17.  O2 saturation 98% on room air.  12/22.  Patient has abdominal pain 9 out of 10 in intensity.  States morphine  makes her itch and wanted to antibiotic instead.  Advised to take oral pain medication.  Patient wants to do clear liquid diet.  Hoping to go home soon.  Declined any imaging. 12/23.  Try to advance diet for breakfast and did not go to well with more abdominal pain.  Ate soup for lunch.  Will see how she does with dinner for breakfast.  Assessment and Plan: * Acute pancreatitis Patient declined imaging.  IV fluid hydration.  Previously had her gallbladder  removed.  Previous triglycerides normal range.  IV fluid hydration.  Advance to solid food this morning but did not go well.  Had soup for lunch.  Will need to tolerate solid food prior to disposition.  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  Declined any further imaging on this hospitalization.  Essential hypertension Blood pressure likely elevated with pain continue Norvasc  and Toprol .  Anxiety and depression On Cymbalta   Obesity (BMI 30-39.9) Class II with a BMI of 38.5  Hiatal hernia On PPI  Constipation On MiraLAX         Subjective: Patient felt okay this morning but received pain medications earlier on.  She tried solid food for breakfast and did not go to well with pain.  She had soup for lunch.  Wants to see how dinner goes.  Admitted with pancreatitis.  Physical Exam: Vitals:   09/19/24 1638 09/19/24 2038 09/20/24 0419 09/20/24 0803  BP: 126/67 (!) 143/68 (!) 145/75 (!) 145/83  Pulse: 71 73 74 82  Resp: 18 18 18 19   Temp: (!) 97.5 F (36.4 C) (!) 97.4 F (36.3 C) (!) 97.3 F (36.3 C) 97.9 F (36.6 C)  TempSrc:  Oral Oral Oral  SpO2: 95% 97% 92% 94%  Weight:      Height:       Physical Exam HENT:     Head: Normocephalic.  Eyes:     General: Lids are normal.     Conjunctiva/sclera: Conjunctivae normal.  Cardiovascular:     Rate and Rhythm: Normal rate and regular rhythm.     Heart sounds: Normal heart sounds, S1 normal and S2 normal.  Pulmonary:     Breath sounds: No decreased breath sounds, wheezing, rhonchi or rales.  Abdominal:     Palpations: Abdomen is soft.     Tenderness: There is generalized abdominal tenderness.  Musculoskeletal:     Right lower leg: No swelling.     Left lower leg: No swelling.  Skin:    General: Skin is warm.     Findings: No rash.  Neurological:     Mental Status: She is alert and oriented to person, place, and time.     Data Reviewed: Lipase 45, creatinine 0.46, calcium  8.6, electrolytes normal  range   Disposition: Status is: Observation Patient did not tolerate breakfast and only had soup for lunch.  Will need to tolerate solid food prior to disposition.  Planned Discharge Destination: Home    Time spent: 28 minutes  Author: Charlie Patterson, MD 09/20/2024 3:54 PM  For on call review [email or web address].  "

## 2024-09-21 DIAGNOSIS — I10 Essential (primary) hypertension: Secondary | ICD-10-CM | POA: Diagnosis not present

## 2024-09-21 DIAGNOSIS — K858 Other acute pancreatitis without necrosis or infection: Secondary | ICD-10-CM

## 2024-09-21 DIAGNOSIS — K862 Cyst of pancreas: Secondary | ICD-10-CM | POA: Diagnosis not present

## 2024-09-21 LAB — BASIC METABOLIC PANEL WITH GFR
Anion gap: 13 (ref 5–15)
BUN: 5 mg/dL — ABNORMAL LOW (ref 8–23)
CO2: 23 mmol/L (ref 22–32)
Calcium: 9 mg/dL (ref 8.9–10.3)
Chloride: 102 mmol/L (ref 98–111)
Creatinine, Ser: 0.54 mg/dL (ref 0.44–1.00)
GFR, Estimated: >60 mL/min
Glucose, Bld: 118 mg/dL — ABNORMAL HIGH (ref 70–99)
Potassium: 3.7 mmol/L (ref 3.5–5.1)
Sodium: 138 mmol/L (ref 135–145)

## 2024-09-21 LAB — CBC
HCT: 33.6 % — ABNORMAL LOW (ref 36.0–46.0)
Hemoglobin: 11.3 g/dL — ABNORMAL LOW (ref 12.0–15.0)
MCH: 30.5 pg (ref 26.0–34.0)
MCHC: 33.6 g/dL (ref 30.0–36.0)
MCV: 90.8 fL (ref 80.0–100.0)
Platelets: 224 K/uL (ref 150–400)
RBC: 3.7 MIL/uL — ABNORMAL LOW (ref 3.87–5.11)
RDW: 17.2 % — ABNORMAL HIGH (ref 11.5–15.5)
WBC: 5.4 K/uL (ref 4.0–10.5)
nRBC: 0 % (ref 0.0–0.2)

## 2024-09-21 NOTE — Discharge Summary (Signed)
 " Physician Discharge Summary   Patient: Rhonda Espinoza MRN: [id number] DOB: 15-Mar-1961  Admit date:     09/19/2024  Discharge date: 09/21/2024  Discharge Physician: Rhonda Espinoza   PCP: Rhonda Frederick, PA-C   Recommendations at discharge:   Follow-up with PCP in 1 week. Follow-up with pancreatitis clinic as previous scheduled.  Discharge Diagnoses: Principal Problem:   Acute pancreatitis Active Problems:   Pancreatic cyst   Essential hypertension   Abdominal pain   Anxiety and depression   Obesity (BMI 30-39.9)   Constipation   Hiatal hernia  Resolved Problems:   * No resolved hospital problems. *  Hospital Course: 63 y.o. female with medical history significant for chronic pancreatitis, hypertension, hyperlipidemia, chronic anxiety/depression, LA grade B erosive esophagitis, history of alcohol withdrawal seizure, presents to the ER with complaints of diffuse abdominal pain.  Associated with nausea and vomiting a week ago.  She presented to the ER yesterday with the same symptoms.  She was treated with pain medications and IV fluid, felt better then was sent home.  Denies any subjective fevers or chills.  No shortness of breath or chest pain.  Today, she almost passed out on the commode due to feeling weak from decreased p.o. intake.   In the ER, endorses diffuse abdominal pain.  No nausea at this time.  Lipase elevated 98.  The patient endorses the pain is similar to prior pancreatitis.  EDP requesting admission due to concern for acute on chronic pancreatitis.  The patient declines CT scan at this time, stating she has had multiple CT scans done and would like to avoid exposure to more radiation.   The patient received IV opiate-based analgesics and IV fluid in the ER.  Admitted by [facility], hospitalist service.   ED Course: Temperature 98.3.  BP 159/98, pulse 80, respiratory rate 17.  O2 saturation 98% on room air.  12/22.  Patient has abdominal pain 9 out of 10 in intensity.  States  morphine  makes her itch and wanted to antibiotic instead.  Advised to take oral pain medication.  Patient wants to do clear liquid diet.  Hoping to go home soon.  Declined any imaging. 12/23.  Try to advance diet for breakfast and did not go to well with more abdominal pain.  Ate soup for lunch.  Will see how she does with dinner for breakfast.  12/24.  Condition improved, patient was able to tolerate soft diet.  Medically stable for discharge.  Assessment and Plan: * Acute pancreatitis Patient declined imaging.  IV fluid hydration.  Previously had her gallbladder removed.  Previous triglycerides normal range.   Condition improved, medically stable for discharge.  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  Declined any further imaging on this hospitalization.  Essential hypertension Resume home medicines.  Anxiety and depression On Cymbalta   Obesity (BMI 30-39.9) Class II with a BMI of 38.5  Hiatal hernia On PPI  Constipation Feels better after MiraLAX .  Patient normally has constipation alternating with diarrhea.        Consultants: None Procedures performed: None  Disposition: Home Diet recommendation:  Discharge Diet Orders (From admission, onward)     Start     Ordered   09/21/24 0000  Diet general       Comments: Soft diet   09/21/24 1007           DISCHARGE MEDICATION: Allergies as of 09/21/2024       Reactions   Latex Other (See Comments)  moreso like bandages- these cause blisters   Wound Dressings Other (See Comments)   moreso like bandages- these cause blisters   Valsartan -hydrochlorothiazide  Other (See Comments)   Caused pancreatitis         Medication List     TAKE these medications    acetaminophen  500 MG tablet Commonly known as: TYLENOL  Take 1 tablet (500 mg total) by mouth as needed for mild pain (pain score 1-3) or headache.   ALPRAZolam  1 MG tablet Commonly known as: XANAX  Take 1 mg by mouth 3 (three) times  daily as needed for sleep or anxiety.   amLODipine  10 MG tablet Commonly known as: NORVASC  Take 1 tablet by mouth daily.   aspirin  EC 81 MG tablet Take 81 mg by mouth in the morning. Swallow whole.   atorvastatin  40 MG tablet Commonly known as: LIPITOR Take 40 mg by mouth daily.   Cholecalciferol 1.25 MG (50000 UT) capsule Take 50,000 Units by mouth once a week. Mondays   DULoxetine  30 MG capsule Commonly known as: CYMBALTA  Take 30 mg by mouth at bedtime.  Take one capsule (60 mg dose) by mouth daily. Take in addition to the 30mg  capsule for total of 90mg  IN THE PM.   DULoxetine  60 MG capsule Commonly known as: CYMBALTA  Take 60 mg by mouth.  Take one capsule (60 mg dose) by mouth daily. Take in addition to the 30mg  capsule for total of 90mg  IN THE PM.   fenofibrate  160 MG tablet Take 160 mg by mouth daily.   gabapentin  300 MG capsule Commonly known as: NEURONTIN  Take 300 mg by mouth daily.   hyoscyamine 0.375 MG 12 hr tablet Commonly known as: LEVBID Take 0.375 mg by mouth every 12 (twelve) hours as needed for cramping.   metoprolol  succinate 25 MG 24 hr tablet Commonly known as: TOPROL -XL Take 25 mg by mouth daily.   multivitamin with minerals tablet Take 1 tablet by mouth daily.   oxyCODONE  5 MG immediate release tablet Commonly known as: Roxicodone  Take 1 tablet (5 mg total) by mouth every 4 (four) hours as needed for severe pain (pain score 7-10).   pantoprazole  40 MG tablet Commonly known as: PROTONIX  Take 40 mg by mouth 2 (two) times daily.   Teriparatide 620 MCG/2.48ML Sopn INJECT 20 MCG UNDER THE SKIN 1 TIME A DAY. DISCARD PEN 28 DAYS AFTER INITIAL USE   thiamine  100 MG tablet Commonly known as: VITAMIN B1 Take 1 tablet (100 mg total) by mouth daily.   zolpidem  12.5 MG CR tablet Commonly known as: AMBIEN  CR Take 12.5 mg by mouth at bedtime as needed for sleep.        Follow-up Information     Rhonda Frederick, PA-C Follow up in 1 week(s).    Specialty: Physician Assistant Contact information: [address] [place] [address] [phone]                Discharge Exam: Rhonda Espinoza   09/19/24 0244 09/19/24 0545  Weight: 106.9 kg 111.5 kg   General exam: Appears calm and comfortable  Respiratory system: Clear to auscultation. Respiratory effort normal. Cardiovascular system: S1 & S2 heard, RRR. No JVD, murmurs, rubs, gallops or clicks. No pedal edema. Gastrointestinal system: Abdomen is nondistended, soft and nontender. No organomegaly or masses felt. Normal bowel sounds heard. Central nervous system: Alert and oriented. No focal neurological deficits. Extremities: Symmetric 5 x 5 power. Skin: No rashes, lesions or ulcers Psychiatry: Judgement and insight appear normal. Mood & affect  appropriate.    Condition at discharge: good  The results of significant diagnostics from this hospitalization (including imaging, microbiology, ancillary and laboratory) are listed below for reference.   Imaging Studies: No results found.  Microbiology: Results for orders placed or performed during the hospital encounter of 05/10/24  Urine Culture     Status: Abnormal   Collection Time: 05/10/24 10:24 PM   Specimen: Urine, Clean Catch  Result Value Ref Range Status   Specimen Description URINE, CLEAN CATCH  Final   Special Requests NONE  Final   Culture (A)  Final    50,000 COLONIES/mL STAPHYLOCOCCUS EPIDERMIDIS 60,000 COLONIES/mL LACTOBACILLUS SPECIES Standardized susceptibility testing for this organism is not available. Performed at [facility], [address] [place], [place] [address]    Report Status 05/13/2024 FINAL  Final   Organism ID, Bacteria STAPHYLOCOCCUS EPIDERMIDIS (A)  Final      Susceptibility   Staphylococcus epidermidis - MIC*    CIPROFLOXACIN <=0.5 SENSITIVE Sensitive     GENTAMICIN <=0.5 SENSITIVE Sensitive     NITROFURANTOIN <=16 SENSITIVE Sensitive     OXACILLIN >=4 RESISTANT  Resistant     TETRACYCLINE <=1 SENSITIVE Sensitive     VANCOMYCIN 2 SENSITIVE Sensitive     TRIMETH/SULFA 20 SENSITIVE Sensitive     RIFAMPIN <=0.5 SENSITIVE Sensitive     Inducible Clindamycin NEGATIVE Sensitive     * 50,000 COLONIES/mL STAPHYLOCOCCUS EPIDERMIDIS  Resp panel by RT-PCR (RSV, Flu A&B, Covid) Anterior Nasal Swab     Status: None   Collection Time: 05/10/24 11:29 PM   Specimen: Anterior Nasal Swab  Result Value Ref Range Status   SARS Coronavirus 2 by RT PCR NEGATIVE NEGATIVE Final   Influenza A by PCR NEGATIVE NEGATIVE Final   Influenza B by PCR NEGATIVE NEGATIVE Final    Comment: (NOTE) The Xpert Xpress SARS-CoV-2/FLU/RSV plus assay is intended as an aid in the diagnosis of influenza from Nasopharyngeal swab specimens and should not be used as a sole basis for treatment. Nasal washings and aspirates are unacceptable for Xpert Xpress SARS-CoV-2/FLU/RSV testing.  Fact Sheet for Patients: bloggercourse.com  Fact Sheet for Healthcare Providers: seriousbroker.it  This test is not yet approved or cleared by the [place]  FDA and has been authorized for detection and/or diagnosis of SARS-CoV-2 by FDA under an Emergency Use Authorization (EUA). This EUA will remain in effect (meaning this test can be used) for the duration of the COVID-19 declaration under Section 564(b)(1) of the Act, 21 U.S.C. section 360bbb-3(b)(1), unless the authorization is terminated or revoked.     Resp Syncytial Virus by PCR NEGATIVE NEGATIVE Final    Comment: (NOTE) Fact Sheet for Patients: bloggercourse.com  Fact Sheet for Healthcare Providers: seriousbroker.it  This test is not yet approved or cleared by the [place]  FDA and has been authorized for detection and/or diagnosis of SARS-CoV-2 by FDA under an Emergency Use Authorization (EUA). This EUA will remain in effect (meaning  this test can be used) for the duration of the COVID-19 declaration under Section 564(b)(1) of the Act, 21 U.S.C. section 360bbb-3(b)(1), unless the authorization is terminated or revoked.  Performed at [facility], [address], [place], [place] [address]     Labs: CBC: Recent Labs  Lab 09/18/24 1100 09/19/24 0256 09/21/24 0611  WBC 6.3 7.2 5.4  NEUTROABS  --  5.4  --   HGB 11.5* 11.1* 11.3*  HCT 34.8* 33.9* 33.6*  MCV 90.4 91.9 90.8  PLT 227 224 224  Basic Metabolic Panel: Recent Labs  Lab 09/18/24 1100 09/19/24 0256 09/20/24 0547 09/21/24 0611  NA 139 139 139 138  K 4.0 4.0 3.9 3.7  CL 100 102 103 102  CO2 25 26 26 23   GLUCOSE 126* 115* 101* 118*  BUN 6* 8 6* 5*  CREATININE 0.54 0.57 0.46 0.54  CALCIUM  8.8* 9.0 8.6* 9.0   Liver Function Tests: Recent Labs  Lab 09/18/24 1100 09/19/24 0256  AST 30 58*  ALT 25 27  ALKPHOS 99 95  BILITOT 0.5 0.4  PROT 6.9 6.6  ALBUMIN 4.1 3.9   CBG: No results for input(s): GLUCAP in the last 168 hours.  Discharge time spent: 35 minutes.  Signed: Murvin Mana, MD [facility] 09/21/2024 "
